# Patient Record
Sex: Male | Born: 1960
Health system: Southern US, Community
[De-identification: ages and names within clinical notes are randomized; demographics above are authoritative.]

## PROBLEM LIST (undated history)

## (undated) DIAGNOSIS — K219 Gastro-esophageal reflux disease without esophagitis: Secondary | ICD-10-CM

## (undated) DIAGNOSIS — K264 Chronic or unspecified duodenal ulcer with hemorrhage: Secondary | ICD-10-CM

## (undated) DIAGNOSIS — I639 Cerebral infarction, unspecified: Secondary | ICD-10-CM

## (undated) DIAGNOSIS — K269 Duodenal ulcer, unspecified as acute or chronic, without hemorrhage or perforation: Secondary | ICD-10-CM

## (undated) DIAGNOSIS — E7800 Pure hypercholesterolemia, unspecified: Secondary | ICD-10-CM

## (undated) DIAGNOSIS — F0390 Unspecified dementia without behavioral disturbance: Secondary | ICD-10-CM

## (undated) DIAGNOSIS — F329 Major depressive disorder, single episode, unspecified: Secondary | ICD-10-CM

## (undated) DIAGNOSIS — D529 Folate deficiency anemia, unspecified: Secondary | ICD-10-CM

## (undated) DIAGNOSIS — S0230XA Fracture of orbital floor, unspecified side, initial encounter for closed fracture: Secondary | ICD-10-CM

## (undated) DIAGNOSIS — E875 Hyperkalemia: Secondary | ICD-10-CM

## (undated) DIAGNOSIS — F1010 Alcohol abuse, uncomplicated: Secondary | ICD-10-CM

## (undated) DIAGNOSIS — Z87820 Personal history of traumatic brain injury: Secondary | ICD-10-CM

## (undated) DIAGNOSIS — G9389 Other specified disorders of brain: Secondary | ICD-10-CM

## (undated) DIAGNOSIS — M1990 Unspecified osteoarthritis, unspecified site: Secondary | ICD-10-CM

## (undated) DIAGNOSIS — R29898 Other symptoms and signs involving the musculoskeletal system: Secondary | ICD-10-CM

## (undated) DIAGNOSIS — W19XXXA Unspecified fall, initial encounter: Secondary | ICD-10-CM

## (undated) DIAGNOSIS — K259 Gastric ulcer, unspecified as acute or chronic, without hemorrhage or perforation: Secondary | ICD-10-CM

## (undated) DIAGNOSIS — S21101A Unspecified open wound of right front wall of thorax without penetration into thoracic cavity, initial encounter: Secondary | ICD-10-CM

## (undated) DIAGNOSIS — F32A Depression, unspecified: Secondary | ICD-10-CM

## (undated) DIAGNOSIS — F319 Bipolar disorder, unspecified: Secondary | ICD-10-CM

## (undated) DIAGNOSIS — F339 Major depressive disorder, recurrent, unspecified: Secondary | ICD-10-CM

## (undated) DIAGNOSIS — F17200 Nicotine dependence, unspecified, uncomplicated: Secondary | ICD-10-CM

## (undated) DIAGNOSIS — N529 Male erectile dysfunction, unspecified: Secondary | ICD-10-CM

## (undated) DIAGNOSIS — J189 Pneumonia, unspecified organism: Secondary | ICD-10-CM

## (undated) DIAGNOSIS — K7290 Hepatic failure, unspecified without coma: Secondary | ICD-10-CM

## (undated) DIAGNOSIS — K760 Fatty (change of) liver, not elsewhere classified: Secondary | ICD-10-CM

## (undated) DIAGNOSIS — I10 Essential (primary) hypertension: Secondary | ICD-10-CM

## (undated) HISTORY — DX: Unspecified open wound of right front wall of thorax without penetration into thoracic cavity, initial encounter: S21.101A

## (undated) HISTORY — DX: Chronic or unspecified duodenal ulcer with hemorrhage: K26.4

## (undated) HISTORY — DX: Unspecified fall, initial encounter: W19.XXXA

## (undated) HISTORY — PX: COLONOSCOPY: SHX174

## (undated) HISTORY — DX: Nicotine dependence, unspecified, uncomplicated: F17.200

## (undated) HISTORY — DX: Personal history of traumatic brain injury: Z87.820

## (undated) HISTORY — PX: EYE SURGERY: SHX253

---
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
## 1995-02-27 DIAGNOSIS — Z87820 Personal history of traumatic brain injury: Secondary | ICD-10-CM | POA: Insufficient documentation

## 1995-02-27 DIAGNOSIS — G9389 Other specified disorders of brain: Secondary | ICD-10-CM

## 1995-02-27 HISTORY — DX: Other specified disorders of brain: G93.89

## 1997-05-31 ENCOUNTER — Encounter: Admission: RE | Admit: 1997-05-31 | Discharge: 1997-05-31 | Payer: Self-pay | Admitting: Sports Medicine

## 1997-06-04 ENCOUNTER — Encounter: Admission: RE | Admit: 1997-06-04 | Discharge: 1997-06-04 | Payer: Self-pay | Admitting: Family Medicine

## 1997-06-15 ENCOUNTER — Encounter: Admission: RE | Admit: 1997-06-15 | Discharge: 1997-06-15 | Payer: Self-pay | Admitting: Family Medicine

## 1998-05-19 ENCOUNTER — Encounter: Admission: RE | Admit: 1998-05-19 | Discharge: 1998-05-19 | Payer: Self-pay | Admitting: Family Medicine

## 1998-12-02 ENCOUNTER — Encounter: Admission: RE | Admit: 1998-12-02 | Discharge: 1998-12-02 | Payer: Self-pay | Admitting: Family Medicine

## 1998-12-16 ENCOUNTER — Encounter: Admission: RE | Admit: 1998-12-16 | Discharge: 1999-03-16 | Payer: Self-pay

## 1999-02-15 ENCOUNTER — Encounter: Admission: RE | Admit: 1999-02-15 | Discharge: 1999-02-15 | Payer: Self-pay | Admitting: Family Medicine

## 2000-01-15 ENCOUNTER — Encounter: Admission: RE | Admit: 2000-01-15 | Discharge: 2000-01-15 | Payer: Self-pay | Admitting: Sports Medicine

## 2000-03-25 ENCOUNTER — Encounter: Admission: RE | Admit: 2000-03-25 | Discharge: 2000-03-25 | Payer: Self-pay | Admitting: Family Medicine

## 2000-05-22 ENCOUNTER — Encounter: Admission: RE | Admit: 2000-05-22 | Discharge: 2000-05-22 | Payer: Self-pay | Admitting: Family Medicine

## 2001-02-04 ENCOUNTER — Encounter: Admission: RE | Admit: 2001-02-04 | Discharge: 2001-02-04 | Payer: Self-pay | Admitting: Family Medicine

## 2001-04-14 ENCOUNTER — Encounter: Admission: RE | Admit: 2001-04-14 | Discharge: 2001-04-14 | Payer: Self-pay | Admitting: Family Medicine

## 2001-04-29 ENCOUNTER — Encounter: Admission: RE | Admit: 2001-04-29 | Discharge: 2001-04-29 | Payer: Self-pay | Admitting: Family Medicine

## 2001-09-22 ENCOUNTER — Encounter: Admission: RE | Admit: 2001-09-22 | Discharge: 2001-09-22 | Payer: Self-pay | Admitting: Family Medicine

## 2002-05-27 ENCOUNTER — Encounter: Admission: RE | Admit: 2002-05-27 | Discharge: 2002-05-27 | Payer: Self-pay | Admitting: Family Medicine

## 2002-05-28 ENCOUNTER — Encounter: Admission: RE | Admit: 2002-05-28 | Discharge: 2002-05-28 | Payer: Self-pay | Admitting: Family Medicine

## 2003-01-11 ENCOUNTER — Encounter: Admission: RE | Admit: 2003-01-11 | Discharge: 2003-01-11 | Payer: Self-pay | Admitting: Family Medicine

## 2003-07-05 ENCOUNTER — Encounter: Admission: RE | Admit: 2003-07-05 | Discharge: 2003-07-05 | Payer: Self-pay | Admitting: Family Medicine

## 2003-08-03 ENCOUNTER — Encounter: Admission: RE | Admit: 2003-08-03 | Discharge: 2003-08-03 | Payer: Self-pay | Admitting: Sports Medicine

## 2003-08-05 ENCOUNTER — Encounter: Admission: RE | Admit: 2003-08-05 | Discharge: 2003-08-05 | Payer: Self-pay | Admitting: Family Medicine

## 2003-09-22 ENCOUNTER — Encounter: Admission: RE | Admit: 2003-09-22 | Discharge: 2003-09-22 | Payer: Self-pay | Admitting: Family Medicine

## 2003-11-04 ENCOUNTER — Ambulatory Visit: Payer: Self-pay | Admitting: Family Medicine

## 2004-05-03 ENCOUNTER — Ambulatory Visit: Payer: Self-pay | Admitting: Family Medicine

## 2004-06-16 ENCOUNTER — Ambulatory Visit: Payer: Self-pay | Admitting: Family Medicine

## 2004-07-06 ENCOUNTER — Ambulatory Visit: Payer: Self-pay | Admitting: Family Medicine

## 2004-07-13 ENCOUNTER — Encounter: Admission: RE | Admit: 2004-07-13 | Discharge: 2004-10-11 | Payer: Self-pay | Admitting: *Deleted

## 2004-08-09 ENCOUNTER — Ambulatory Visit: Payer: Self-pay | Admitting: Family Medicine

## 2005-02-09 ENCOUNTER — Ambulatory Visit: Payer: Self-pay | Admitting: Family Medicine

## 2005-03-14 ENCOUNTER — Ambulatory Visit: Payer: Self-pay | Admitting: Family Medicine

## 2005-04-24 ENCOUNTER — Ambulatory Visit: Payer: Self-pay | Admitting: Family Medicine

## 2005-05-22 ENCOUNTER — Ambulatory Visit: Payer: Self-pay | Admitting: Family Medicine

## 2005-07-05 ENCOUNTER — Ambulatory Visit: Payer: Self-pay | Admitting: Family Medicine

## 2005-07-19 ENCOUNTER — Ambulatory Visit: Payer: Self-pay | Admitting: Sports Medicine

## 2005-07-26 ENCOUNTER — Ambulatory Visit: Payer: Self-pay | Admitting: Family Medicine

## 2005-09-06 ENCOUNTER — Ambulatory Visit: Payer: Self-pay | Admitting: Family Medicine

## 2005-10-10 ENCOUNTER — Ambulatory Visit: Payer: Self-pay | Admitting: Sports Medicine

## 2006-01-02 ENCOUNTER — Ambulatory Visit: Payer: Self-pay | Admitting: Family Medicine

## 2006-01-29 ENCOUNTER — Ambulatory Visit: Payer: Self-pay | Admitting: Sports Medicine

## 2006-01-30 ENCOUNTER — Encounter: Admission: RE | Admit: 2006-01-30 | Discharge: 2006-01-30 | Payer: Self-pay | Admitting: Sports Medicine

## 2006-04-25 DIAGNOSIS — F339 Major depressive disorder, recurrent, unspecified: Secondary | ICD-10-CM | POA: Insufficient documentation

## 2006-04-25 DIAGNOSIS — N529 Male erectile dysfunction, unspecified: Secondary | ICD-10-CM

## 2006-04-25 DIAGNOSIS — K279 Peptic ulcer, site unspecified, unspecified as acute or chronic, without hemorrhage or perforation: Secondary | ICD-10-CM

## 2006-04-25 DIAGNOSIS — E781 Pure hyperglyceridemia: Secondary | ICD-10-CM

## 2006-04-25 DIAGNOSIS — E669 Obesity, unspecified: Secondary | ICD-10-CM | POA: Insufficient documentation

## 2006-04-25 DIAGNOSIS — I10 Essential (primary) hypertension: Secondary | ICD-10-CM

## 2006-04-25 DIAGNOSIS — F17200 Nicotine dependence, unspecified, uncomplicated: Secondary | ICD-10-CM | POA: Insufficient documentation

## 2006-04-25 DIAGNOSIS — M1A00X0 Idiopathic chronic gout, unspecified site, without tophus (tophi): Secondary | ICD-10-CM | POA: Insufficient documentation

## 2006-04-25 HISTORY — DX: Major depressive disorder, recurrent, unspecified: F33.9

## 2006-04-29 ENCOUNTER — Encounter (INDEPENDENT_AMBULATORY_CARE_PROVIDER_SITE_OTHER): Payer: Self-pay | Admitting: *Deleted

## 2006-04-29 ENCOUNTER — Ambulatory Visit: Payer: Self-pay | Admitting: Family Medicine

## 2006-06-27 DIAGNOSIS — F3162 Bipolar disorder, current episode mixed, moderate: Secondary | ICD-10-CM | POA: Insufficient documentation

## 2006-07-03 ENCOUNTER — Ambulatory Visit: Payer: Self-pay | Admitting: Family Medicine

## 2006-07-03 LAB — CONVERTED CEMR LAB
ALT: 45 units/L (ref 0–53)
AST: 54 units/L — ABNORMAL HIGH (ref 0–37)
Alkaline Phosphatase: 68 units/L (ref 39–117)
BUN: 8 mg/dL (ref 6–23)
Calcium: 9.6 mg/dL (ref 8.4–10.5)
Chloride: 100 meq/L (ref 96–112)
Creatinine, Ser: 1.01 mg/dL (ref 0.40–1.50)
Potassium: 3.4 meq/L — ABNORMAL LOW (ref 3.5–5.3)
Sodium: 137 meq/L (ref 135–145)
Valproic Acid Lvl: 18.3 ug/mL — ABNORMAL LOW (ref 50.0–100.0)

## 2006-08-07 ENCOUNTER — Ambulatory Visit: Payer: Self-pay | Admitting: Family Medicine

## 2006-08-07 ENCOUNTER — Encounter (INDEPENDENT_AMBULATORY_CARE_PROVIDER_SITE_OTHER): Payer: Self-pay | Admitting: *Deleted

## 2006-08-07 DIAGNOSIS — R809 Proteinuria, unspecified: Secondary | ICD-10-CM

## 2006-08-07 LAB — CONVERTED CEMR LAB
ALT: 48 units/L (ref 0–53)
CO2: 23 meq/L (ref 19–32)
Calcium: 9.6 mg/dL (ref 8.4–10.5)
Chloride: 100 meq/L (ref 96–112)
Cholesterol: 188 mg/dL (ref 0–200)
Glucose, Bld: 116 mg/dL — ABNORMAL HIGH (ref 70–99)
Hgb A1c MFr Bld: 4.9 %
LDL Cholesterol: 73 mg/dL (ref 0–99)
Protein, U semiquant: >=300
Sodium: 137 meq/L (ref 135–145)
Total CHOL/HDL Ratio: 2.3
Total Protein: 7.3 g/dL (ref 6.0–8.3)
Triglycerides: 164 mg/dL — ABNORMAL HIGH (ref <150)
VLDL: 33 mg/dL (ref 0–40)

## 2006-08-08 ENCOUNTER — Telehealth: Payer: Self-pay | Admitting: *Deleted

## 2006-10-02 ENCOUNTER — Ambulatory Visit: Payer: Self-pay | Admitting: Family Medicine

## 2007-02-26 ENCOUNTER — Encounter (INDEPENDENT_AMBULATORY_CARE_PROVIDER_SITE_OTHER): Payer: Self-pay | Admitting: *Deleted

## 2007-02-26 ENCOUNTER — Ambulatory Visit: Payer: Self-pay | Admitting: Family Medicine

## 2007-02-28 ENCOUNTER — Telehealth (INDEPENDENT_AMBULATORY_CARE_PROVIDER_SITE_OTHER): Payer: Self-pay | Admitting: *Deleted

## 2007-02-28 LAB — CONVERTED CEMR LAB
ALT: 34 units/L (ref 0–53)
CO2: 21 meq/L (ref 19–32)
Calcium: 9.7 mg/dL (ref 8.4–10.5)
Chloride: 102 meq/L (ref 96–112)
Sodium: 137 meq/L (ref 135–145)
Total Protein: 7.4 g/dL (ref 6.0–8.3)
Uric Acid, Serum: 4.3 mg/dL (ref 2.4–7.0)

## 2007-05-08 ENCOUNTER — Ambulatory Visit: Payer: Self-pay | Admitting: Sports Medicine

## 2007-06-11 ENCOUNTER — Encounter (INDEPENDENT_AMBULATORY_CARE_PROVIDER_SITE_OTHER): Payer: Self-pay | Admitting: *Deleted

## 2007-06-11 ENCOUNTER — Ambulatory Visit: Payer: Self-pay | Admitting: Family Medicine

## 2007-06-13 LAB — CONVERTED CEMR LAB
ALT: 24 units/L (ref 0–53)
AST: 37 units/L (ref 0–37)
Alkaline Phosphatase: 57 units/L (ref 39–117)
CO2: 22 meq/L (ref 19–32)
MCHC: 33.9 g/dL (ref 30.0–36.0)
MCV: 108.3 fL — ABNORMAL HIGH (ref 78.0–100.0)
Platelets: 204 10*3/uL (ref 150–400)
RBC: 3.73 M/uL — ABNORMAL LOW (ref 4.22–5.81)
Sodium: 139 meq/L (ref 135–145)
Total Bilirubin: 0.7 mg/dL (ref 0.3–1.2)
Total Protein: 7.2 g/dL (ref 6.0–8.3)
WBC: 9.6 10*3/uL (ref 4.0–10.5)

## 2007-08-04 ENCOUNTER — Encounter (INDEPENDENT_AMBULATORY_CARE_PROVIDER_SITE_OTHER): Payer: Self-pay | Admitting: *Deleted

## 2007-11-10 ENCOUNTER — Encounter: Payer: Self-pay | Admitting: Family Medicine

## 2007-11-10 ENCOUNTER — Ambulatory Visit: Payer: Self-pay | Admitting: Family Medicine

## 2007-11-10 LAB — CONVERTED CEMR LAB: Hgb A1c MFr Bld: 4.8 %

## 2007-11-11 LAB — CONVERTED CEMR LAB
ALT: 49 U/L
AST: 50 U/L — ABNORMAL HIGH
Albumin: 4.3 g/dL
Alkaline Phosphatase: 60 U/L
BUN: 11 mg/dL
CO2: 20 meq/L
Calcium: 9.3 mg/dL
Chloride: 101 meq/L
Cholesterol: 167 mg/dL
Creatinine, Ser: 0.98 mg/dL
Glucose, Bld: 131 mg/dL — ABNORMAL HIGH
HCT: 43.1 %
HDL: 76 mg/dL
Hemoglobin: 14.6 g/dL
LDL Cholesterol: 72 mg/dL
MCHC: 33.9 g/dL
MCV: 109.1 fL — ABNORMAL HIGH
Platelets: 187 K/uL
Potassium: 4.1 meq/L
RBC: 3.95 M/uL — ABNORMAL LOW
RDW: 13.5 %
Sodium: 134 meq/L — ABNORMAL LOW
Total Bilirubin: 0.7 mg/dL
Total CHOL/HDL Ratio: 2.2
Total Protein: 7.2 g/dL
Triglycerides: 96 mg/dL
VLDL: 19 mg/dL
Valproic Acid Lvl: 39.1 ug/mL — ABNORMAL LOW
WBC: 8.8 10*3/microliter

## 2007-11-14 ENCOUNTER — Encounter (INDEPENDENT_AMBULATORY_CARE_PROVIDER_SITE_OTHER): Payer: Self-pay | Admitting: *Deleted

## 2008-05-10 ENCOUNTER — Inpatient Hospital Stay (HOSPITAL_COMMUNITY): Admission: EM | Admit: 2008-05-10 | Discharge: 2008-05-13 | Payer: Self-pay | Admitting: Emergency Medicine

## 2008-05-10 ENCOUNTER — Ambulatory Visit: Payer: Self-pay | Admitting: Internal Medicine

## 2008-06-02 ENCOUNTER — Ambulatory Visit: Payer: Self-pay | Admitting: Family Medicine

## 2008-07-01 ENCOUNTER — Ambulatory Visit: Payer: Self-pay | Admitting: Family Medicine

## 2008-08-02 ENCOUNTER — Ambulatory Visit: Payer: Self-pay | Admitting: Family Medicine

## 2008-08-03 ENCOUNTER — Encounter (INDEPENDENT_AMBULATORY_CARE_PROVIDER_SITE_OTHER): Payer: Self-pay | Admitting: Family Medicine

## 2008-09-07 ENCOUNTER — Ambulatory Visit: Payer: Self-pay | Admitting: Family Medicine

## 2008-09-07 ENCOUNTER — Encounter: Payer: Self-pay | Admitting: Family Medicine

## 2008-09-09 DIAGNOSIS — R7401 Elevation of levels of liver transaminase levels: Secondary | ICD-10-CM | POA: Insufficient documentation

## 2008-09-09 DIAGNOSIS — R740 Nonspecific elevation of levels of transaminase and lactic acid dehydrogenase [LDH]: Secondary | ICD-10-CM

## 2008-09-09 LAB — CONVERTED CEMR LAB
ALT: 65 units/L — ABNORMAL HIGH (ref 0–53)
Albumin: 4.2 g/dL (ref 3.5–5.2)
Alkaline Phosphatase: 55 units/L (ref 39–117)
Potassium: 4.9 meq/L (ref 3.5–5.3)
Sodium: 136 meq/L (ref 135–145)
Total Bilirubin: 0.8 mg/dL (ref 0.3–1.2)
Total Protein: 7.2 g/dL (ref 6.0–8.3)

## 2008-11-08 ENCOUNTER — Encounter: Payer: Self-pay | Admitting: *Deleted

## 2008-11-17 ENCOUNTER — Encounter: Payer: Self-pay | Admitting: Family Medicine

## 2008-11-17 ENCOUNTER — Ambulatory Visit: Payer: Self-pay | Admitting: Family Medicine

## 2008-11-19 ENCOUNTER — Encounter: Payer: Self-pay | Admitting: Family Medicine

## 2008-11-19 LAB — CONVERTED CEMR LAB
BUN: 13 mg/dL (ref 6–23)
CO2: 19 meq/L (ref 19–32)
Calcium: 9.4 mg/dL (ref 8.4–10.5)
Chloride: 101 meq/L (ref 96–112)
Cholesterol: 169 mg/dL (ref 0–200)
Creatinine, Ser: 1.1 mg/dL (ref 0.40–1.50)
HDL: 71 mg/dL (ref >39)
Total CHOL/HDL Ratio: 2.4
Triglycerides: 88 mg/dL (ref <150)

## 2009-04-18 ENCOUNTER — Ambulatory Visit: Payer: Self-pay | Admitting: Family Medicine

## 2009-04-18 ENCOUNTER — Encounter: Payer: Self-pay | Admitting: Family Medicine

## 2009-04-27 LAB — CONVERTED CEMR LAB
ALT: 38 units/L (ref 0–53)
AST: 43 units/L — ABNORMAL HIGH (ref 0–37)
Alkaline Phosphatase: 62 units/L (ref 39–117)
BUN: 10 mg/dL (ref 6–23)
Chloride: 96 meq/L (ref 96–112)
Creatinine, Ser: 0.89 mg/dL (ref 0.40–1.50)
Direct LDL: 86 mg/dL
Total Bilirubin: 1 mg/dL (ref 0.3–1.2)

## 2009-07-11 ENCOUNTER — Ambulatory Visit: Payer: Self-pay | Admitting: Family Medicine

## 2009-07-11 ENCOUNTER — Encounter: Payer: Self-pay | Admitting: Family Medicine

## 2009-07-11 DIAGNOSIS — R109 Unspecified abdominal pain: Secondary | ICD-10-CM | POA: Insufficient documentation

## 2009-07-11 DIAGNOSIS — F1020 Alcohol dependence, uncomplicated: Secondary | ICD-10-CM | POA: Insufficient documentation

## 2009-07-11 DIAGNOSIS — R634 Abnormal weight loss: Secondary | ICD-10-CM | POA: Insufficient documentation

## 2009-07-11 LAB — CONVERTED CEMR LAB
ALT: 130 units/L — ABNORMAL HIGH (ref 0–53)
Ammonia: 34 umol/L (ref 11–35)
Bilirubin Urine: POSITIVE
Blood in Urine, dipstick: NEGATIVE
CO2: 22 meq/L (ref 19–32)
Calcium: 9.3 mg/dL (ref 8.4–10.5)
Chloride: 103 meq/L (ref 96–112)
Creatinine, Ser: 1.07 mg/dL (ref 0.40–1.50)
GGT: 148 units/L — ABNORMAL HIGH (ref 7–51)
Glucose, Urine, Semiquant: NEGATIVE
HCT: 34.6 % — ABNORMAL LOW (ref 39.0–52.0)
MCV: 103.9 fL — ABNORMAL HIGH (ref 78.0–100.0)
Nitrite: NEGATIVE
Platelets: 237 10*3/uL (ref 150–400)
Protein, U semiquant: 30
RBC: 3.33 M/uL — ABNORMAL LOW (ref 4.22–5.81)
Sodium: 133 meq/L — ABNORMAL LOW (ref 135–145)
Specific Gravity, Urine: 1.015
Total Protein: 6.7 g/dL (ref 6.0–8.3)
Urobilinogen, UA: 1
WBC Urine, dipstick: NEGATIVE
WBC: 7.9 10*3/uL (ref 4.0–10.5)
pH: 6

## 2009-07-12 ENCOUNTER — Telehealth: Payer: Self-pay | Admitting: Family Medicine

## 2009-07-13 ENCOUNTER — Encounter: Payer: Self-pay | Admitting: Family Medicine

## 2009-07-18 ENCOUNTER — Ambulatory Visit: Payer: Self-pay | Admitting: Family Medicine

## 2009-07-18 ENCOUNTER — Encounter: Admission: RE | Admit: 2009-07-18 | Discharge: 2009-07-18 | Payer: Self-pay | Admitting: Family Medicine

## 2009-07-18 ENCOUNTER — Encounter: Payer: Self-pay | Admitting: Family Medicine

## 2009-07-19 ENCOUNTER — Encounter: Payer: Self-pay | Admitting: Family Medicine

## 2009-07-19 ENCOUNTER — Telehealth: Payer: Self-pay | Admitting: Family Medicine

## 2009-07-20 ENCOUNTER — Telehealth: Payer: Self-pay | Admitting: Family Medicine

## 2009-07-20 LAB — CONVERTED CEMR LAB
Folate: 2.1 ng/mL — ABNORMAL LOW
RBC: 3.31 M/uL — ABNORMAL LOW (ref 4.22–5.81)
Vitamin B-12: 369 pg/mL (ref 211–911)

## 2009-07-28 ENCOUNTER — Encounter: Payer: Self-pay | Admitting: Family Medicine

## 2009-07-28 ENCOUNTER — Ambulatory Visit: Payer: Self-pay | Admitting: Family Medicine

## 2009-07-28 DIAGNOSIS — R269 Unspecified abnormalities of gait and mobility: Secondary | ICD-10-CM | POA: Insufficient documentation

## 2009-07-28 DIAGNOSIS — M79609 Pain in unspecified limb: Secondary | ICD-10-CM | POA: Insufficient documentation

## 2009-07-29 ENCOUNTER — Encounter: Admission: RE | Admit: 2009-07-29 | Discharge: 2009-07-29 | Payer: Self-pay | Admitting: Family Medicine

## 2009-07-29 ENCOUNTER — Encounter: Payer: Self-pay | Admitting: Family Medicine

## 2009-07-29 LAB — CONVERTED CEMR LAB

## 2009-08-02 ENCOUNTER — Encounter: Payer: Self-pay | Admitting: Family Medicine

## 2009-08-02 ENCOUNTER — Ambulatory Visit: Payer: Self-pay | Admitting: Family Medicine

## 2009-08-02 DIAGNOSIS — D529 Folate deficiency anemia, unspecified: Secondary | ICD-10-CM

## 2009-08-02 HISTORY — DX: Folate deficiency anemia, unspecified: D52.9

## 2009-08-03 ENCOUNTER — Encounter: Payer: Self-pay | Admitting: *Deleted

## 2009-08-03 LAB — CONVERTED CEMR LAB
ALT: 32 units/L (ref 0–53)
AST: 62 units/L — ABNORMAL HIGH (ref 0–37)
Albumin: 4.1 g/dL (ref 3.5–5.2)
Calcium: 9.5 mg/dL (ref 8.4–10.5)
Chloride: 101 meq/L (ref 96–112)
Direct LDL: 96 mg/dL
Potassium: 4.4 meq/L (ref 3.5–5.3)
Sodium: 134 meq/L — ABNORMAL LOW (ref 135–145)
Total Protein: 7.6 g/dL (ref 6.0–8.3)

## 2009-08-08 ENCOUNTER — Encounter: Admission: RE | Admit: 2009-08-08 | Discharge: 2009-08-08 | Payer: Self-pay | Admitting: Family Medicine

## 2009-08-24 ENCOUNTER — Ambulatory Visit: Payer: Self-pay | Admitting: Family Medicine

## 2009-08-24 ENCOUNTER — Encounter: Payer: Self-pay | Admitting: Family Medicine

## 2009-08-24 LAB — CONVERTED CEMR LAB
ALT: 30 units/L (ref 0–53)
Albumin: 4 g/dL (ref 3.5–5.2)
CO2: 19 meq/L (ref 19–32)
Chloride: 107 meq/L (ref 96–112)
Platelets: 186 10*3/uL (ref 150–400)
Potassium: 4.3 meq/L (ref 3.5–5.3)
Sodium: 138 meq/L (ref 135–145)
Total Bilirubin: 0.6 mg/dL (ref 0.3–1.2)
Total Protein: 7.3 g/dL (ref 6.0–8.3)
Triglycerides: 89 mg/dL (ref <150)
WBC: 8.3 10*3/uL (ref 4.0–10.5)

## 2009-08-25 ENCOUNTER — Encounter: Payer: Self-pay | Admitting: Family Medicine

## 2010-01-13 ENCOUNTER — Ambulatory Visit: Payer: Self-pay | Admitting: Family Medicine

## 2010-01-13 ENCOUNTER — Encounter: Payer: Self-pay | Admitting: Family Medicine

## 2010-01-13 LAB — CONVERTED CEMR LAB
Albumin: 4.2 g/dL (ref 3.5–5.2)
Alkaline Phosphatase: 93 units/L (ref 39–117)
CO2: 22 meq/L (ref 19–32)
Glucose, Bld: 123 mg/dL — ABNORMAL HIGH (ref 70–99)
Lithium Lvl: 0.31 meq/L — ABNORMAL LOW (ref 0.80–1.40)
Potassium: 4.3 meq/L (ref 3.5–5.3)
Sodium: 139 meq/L (ref 135–145)
TSH: 2.353 microintl units/mL (ref 0.350–4.500)
Total Protein: 7.3 g/dL (ref 6.0–8.3)

## 2010-01-16 ENCOUNTER — Telehealth: Payer: Self-pay | Admitting: Family Medicine

## 2010-03-03 ENCOUNTER — Telehealth: Payer: Self-pay | Admitting: Family Medicine

## 2010-03-19 ENCOUNTER — Encounter: Payer: Self-pay | Admitting: Family Medicine

## 2010-03-28 ENCOUNTER — Ambulatory Visit: Admission: RE | Admit: 2010-03-28 | Discharge: 2010-03-28 | Payer: Self-pay | Source: Home / Self Care

## 2010-03-30 NOTE — Progress Notes (Signed)
  Phone Note Outgoing Call   Call placed by: Milinda Antis MD,  January 16, 2010 2:18 PM Details for Reason: Call To Dr. Donell Beers Summary of Call: Called and spoke with Triage at Dr. Donell Beers office, given his voicemail to relay lab results. Lithium level subtherapeutic at 0.31 Also faxed labs

## 2010-03-30 NOTE — Assessment & Plan Note (Signed)
Summary: f/up,tcb(resch'd from 10/31)[facility]   Vital Signs:  Patient profile:   50 year old male Height:      72 inches Weight:      238 pounds BMI:     32.40 Temp:     98.2 degrees F oral BP sitting:   130 / 80  (right arm) Cuff size:   regular  Vitals Entered By: Tessie Fass CMA (January 13, 2010 1:51 PM) CC: F/U labs, HTN Is Patient Diabetic? No Pain Assessment Patient in pain? no        Primary Care Provider:  Eustaquio Boyden  MD  CC:  F/U labs and HTN.  History of Present Illness:   No concerns Need flu shot Need labs drawn from mental health   HTN- no HA, no CP, no SOB, tolerating BP meds  Tobacco use- unchanged  [facility] Dr. Donell Beers  Fax [phone]  Habits & Providers  Alcohol-Tobacco-Diet     Tobacco Status: current     Tobacco Counseling: to quit use of tobacco products     Cigarette Packs/Day: 0.5  Current Medications (verified): 1)  Aspirin 81 Mg Chew (Aspirin) .... One Daily 2)  Norvasc 10 Mg Tabs (Amlodipine Besylate) .... Take 1 Tablet By Mouth Once A Day 3)  Lopressor 100 Mg  Tabs (Metoprolol Tartrate) .Marland Kitchen.. 1 By Mouth Two Times A Day 4)  Spironolactone 50 Mg Tabs (Spironolactone) .... Take One By Mouth Qdaily 5)  Allopurinol 300 Mg Tabs (Allopurinol) .... Take One By Mouth Daily 6)  Lovaza 1 Gm Caps (Omega-3-Acid Ethyl Esters) .... Take 2 Capsule By Mouth Twice A Day 7)  Fluoxetine Hcl 40 Mg Caps (Fluoxetine Hcl) .... Take 1 Capsule By Mouth Once A Day 8)  Folic Acid 1 Mg Tabs (Folic Acid) .... One Daily 9)  Vitamin B-1 100 Mg Tabs (Thiamine Hcl) .... One Daily 10)  Multivitamins  Caps (Multiple Vitamin) .... One Daily 11)  Tramadol Hcl 50 Mg Tabs (Tramadol Hcl) .... One By Mouth Two Times A Day As Needed Pain 12)  Lithium Carbonate 450 Mg Cr-Tabs (Lithium Carbonate) .Marland Kitchen.. 1 By Mouth Daily Mental Health  Allergies (verified): 1)  ! Ace Inhibitors 2)  Penicillin G Potassium (Penicillin G Potassium)  Past  History:  Past Medical History: Hospitalization for ACEI --> angioedema with ICU stay and intubation 04/2008 Baseline Cr 1.1 w/ longstanding HTN - 05/05 Full neuro psych eval 12/00 ([facility]), Head trauma (`97) with resulting neuro deficit ?DM diagnosed by elevated fcbg x 2, A1c 5.2% h/o hypertriglyceridemia treated with lovaza, niaspan and fibrate until elevated LFTs 2010, now only on lovaza. h/o imbalance/falls with posterior rib fracture, found to have folate deficiency h/o EtOH binging hypertension psych- dr. riddle and dr. reddy optho- dr. Mitzi Davenport  Physical Exam  General:  Well-developed,well-nourished,in no acute distress; alert,appropriate and cooperative throughout examination Vital signs noted  Lungs:  CTAB Heart:  RRR, no murmur Extremities:  no edema   Impression & Recommendations:  Problem # 1:  HYPERTENSION, BENIGN SYSTEMIC (ICD-401.1) Assessment Unchanged No change to meds doing well His updated medication list for this problem includes:    Norvasc 10 Mg Tabs (Amlodipine besylate) .Marland Kitchen... Take 1 tablet by mouth once a day    Lopressor 100 Mg Tabs (Metoprolol tartrate) .Marland Kitchen... 1 by mouth two times a day    Spironolactone 50 Mg Tabs (Spironolactone) .Marland Kitchen... Take one by mouth qdaily  Orders: Comp Met-[facility] ([id number]) [facility]- Est Level  3 (24401)  Problem # 2:  TOBACCO DEPENDENCE (ICD-305.1)  Assessment: Unchanged not ready to quit Orders: [facility]- Est Level  3 (40981)  Complete Medication List: 1)  Aspirin 81 Mg Chew (Aspirin) .... One daily 2)  Norvasc 10 Mg Tabs (Amlodipine besylate) .... Take 1 tablet by mouth once a day 3)  Lopressor 100 Mg Tabs (Metoprolol tartrate) .Marland Kitchen.. 1 by mouth two times a day 4)  Spironolactone 50 Mg Tabs (Spironolactone) .... Take one by mouth qdaily 5)  Allopurinol 300 Mg Tabs (Allopurinol) .... Take one by mouth daily 6)  Lovaza 1 Gm Caps (Omega-3-acid ethyl esters) .... Take 2 capsule by mouth twice a day 7)  Fluoxetine Hcl 40 Mg Caps  (Fluoxetine hcl) .... Take 1 capsule by mouth once a day 8)  Folic Acid 1 Mg Tabs (Folic acid) .... One daily 9)  Vitamin B-1 100 Mg Tabs (Thiamine hcl) .... One daily 10)  Multivitamins Caps (Multiple vitamin) .... One daily 11)  Tramadol Hcl 50 Mg Tabs (Tramadol hcl) .... One by mouth two times a day as needed pain 12)  Lithium Carbonate 450 Mg Cr-tabs (Lithium carbonate) .Marland Kitchen.. 1 by mouth daily mental health  Other Orders: TSH-[facility] [id number]) Miscellaneous Lab Charge-[facility] [phone]) Influenza Vaccine NON MCR (65784)  Patient Instructions: 1)  Today you received your Flu shot 2)   We will fax the labs to Dr. Olin Hauser 3)  Make an appt to see Dr. Alvester Morin  in Jan or Feb  Prescriptions: FOLIC ACID 1 MG TABS (FOLIC ACID) one daily  #60 x 3   Entered and Authorized by:   Milinda Antis MD   Signed by:   Milinda Antis MD on 01/13/2010   Method used:   [facility] to        [facility]  [address] [id number]       [address]       [place], [place]  [address]       Ph: [phone] or [phone]       Fax: [phone]   RxID:   [id number]    Orders Added: 1)  Comp Met-[facility] [[id number]] 2)  TSH-[facility] [[id number]] 3)  Miscellaneous Lab Charge-[facility] [99999] 4)  Influenza Vaccine NON MCR [00028] 5)  [facility]- Est Level  3 [54270]   Immunizations Administered:  Influenza Vaccine # 1:    Vaccine Type: Fluvax Non-MCR    Site: right deltoid    Mfr: GlaxoSmithKline    Dose: 0.5 ml    Route: IM    Given by: Tessie Fass CMA    Exp. Date: 08/26/2010    Lot #: [id number]    VIS given: 09/20/09 version given January 13, 2010.  Flu Vaccine Consent Questions:    Do you have a history of severe allergic reactions to this vaccine? no    Any prior history of allergic reactions to egg and/or gelatin? no    Do you have a sensitivity to the preservative Thimersol? no    Do you have a past history of Guillan-Barre Syndrome? no    Do you currently have an acute febrile  illness? no    Have you ever had a severe reaction to latex? no    Vaccine information given and explained to patient? yes   Immunizations Administered:  Influenza Vaccine # 1:    Vaccine Type: Fluvax Non-MCR    Site: right deltoid    Mfr: GlaxoSmithKline    Dose: 0.5 ml    Route: IM    Given by: Tessie Fass CMA  Exp. Date: 08/26/2010    Lot #: [id number]    VIS given: 09/20/09 version given January 13, 2010.   Prevention & Chronic Care Immunizations   Influenza vaccine: Fluvax Non-MCR  (01/13/2010)   Influenza vaccine due: 10/27/2009    Tetanus booster: 06/27/2003: Done.   Tetanus booster due: 06/26/2013    Pneumococcal vaccine: Pneumovax  (02/26/2007)   Pneumococcal vaccine due: 02/26/2012  Colorectal Screening   Hemoccult: Not documented   Hemoccult action/deferral: Ordered  (07/28/2009)    Colonoscopy: Not documented   Colonoscopy action/deferral: GI Referral  (04/18/2009)  Other Screening   PSA: 0.26  (07/29/2009)   PSA action/deferral: Discussion deferred  (09/07/2008)   PSA due due: 07/30/2010   Smoking status: current  (01/13/2010)   Smoking cessation counseling: yes  (06/11/2007)  Lipids   Total Cholesterol: 169  (11/17/2008)   LDL: 80  (11/17/2008)   LDL Direct: 96  (08/02/2009)   HDL: 71  (11/17/2008)   Triglycerides: 89  (08/24/2009)    SGOT (AST): 38  (08/24/2009)   SGPT (ALT): 30  (08/24/2009) CMP ordered    Alkaline phosphatase: 76  (08/24/2009)   Total bilirubin: 0.6  (08/24/2009)  Hypertension   Last Blood Pressure: 130 / 80  (01/13/2010)   Serum creatinine: 0.98  (08/24/2009)   Serum potassium 4.3  (08/24/2009) CMP ordered     Hypertension flowsheet reviewed?: Yes   Progress toward BP goal: At goal  Self-Management Support :   Personal Goals (by the next clinic visit) :      Personal blood pressure goal: 140/90  (04/18/2009)     Personal LDL goal: 100  (04/18/2009)    Hypertension self-management support: Written  self-care plan  (07/28/2009)    Lipid self-management support: Not documented     Lipid self-management support not done because: Good outcomes  (04/18/2009)

## 2010-03-30 NOTE — Letter (Signed)
Summary: Results Follow-up Letter  [facility]  [address]   [place], [place] [address]   Phone: [phone]  Fax: [phone]    07/13/2009  [address], [place]  [address]  Dear Mr. LADY,   The following are the results of your recent test(s):  Test     Result      Your thyroid function and kidney function was normal.  Your liver function was elevated again, more than last time.  AST 130s, ALT 130s.  Normal is around 30s.  This leads me to believe that the alcohol consumption is causing liver damage.  I'm glad that you stopped recently, but this is something you will need to abstain from for the long run.  We will need to get an ultrasound of your liver in the next few months.  However, this doesn't explain your left sided abdominal pain (since your liver is on the right side).    Your blood level (red blood cells) was somewhat low, and the size of your red blood cells was large.  This is indication of a vitamin deficiency from drinking alcohol.  I would like you to come in at your earliest convenience to check vitamin levels, and we will likely start you on specific vitamin supplementation (folate and B12) once your levels return.  I also want you to get that chest xray.  In the meantime, it would be good to start taking a multivitamin daily.  The vitamin deficiency could explain your imbalance and possibly your abdominal pain.  I would like to see you (early June) after you get your blood work and chest xray.  Please call us with questions. _________________________________________________________    Sincerely,  Eustaquio Boyden  MD [facility]            Appended Document: Results Follow-up Letter letter mailed.

## 2010-03-30 NOTE — Miscellaneous (Signed)
Summary: re: [place]  Clinical Lists Changes called pt lmom to return call. pt has [place] appt at [address] [facility] 08/08/09 at 8:00. needs to arrive 15 minutes early and DO NOT eat or drink anything after midnight the night before.Tessie Fass CMA  August 03, 2009 12:26 PM CALLED PT AND LEFT DETAILED MESSAGE WITH ABOVE INFO ON THE ANSWERING MACHINE.Marland KitchenArlyss Repress CMA,  August 05, 2009 11:06 AM

## 2010-03-30 NOTE — Progress Notes (Signed)
Summary: phn msg  Phone Note Call from Patient Call back at [phone]   Caller: Elon Jester Summary of Call: pt did not understand all that Dr Reece Agar told him and wife is hoping to talk to the doctor to see if she can help understand. Initial call taken by: De Nurse,  Jul 12, 2009 8:45 AM  Follow-up for Phone Call        spoke with Mr. Mullarkey again, he says there was no confusion.  advised to get blood work Monday and CXR.  Concerned for B12 or folate deficiency in this long term EtOH user.  Also advised to stay away from alcohol as it's damaging liver.  May need liver US to further eval as elevated LFTs again today.  will get vitamin levels then start folate/B12. Follow-up by: Eustaquio Boyden  MD,  Jul 12, 2009 3:31 PM  New Problems: ANEMIA, MACROCYTIC (ICD-281.9)   New Problems: ANEMIA, MACROCYTIC (ICD-281.9) Prescriptions: ALLOPURINOL 300 MG TABS (ALLOPURINOL) take one by mouth daily  #31 x 5   Entered and Authorized by:   Eustaquio Boyden  MD   Signed by:   Eustaquio Boyden  MD on 07/12/2009   Method used:   [facility]  [address] [id number]       [address]       [place], [place]  [address]       Ph: [phone] or [phone]       Fax: [phone]   RxID:   [id number]

## 2010-03-30 NOTE — Assessment & Plan Note (Signed)
Summary: f/up,tcb, UA, ESR = 71   Vital Signs:  Patient profile:   50 year old male Height:      72 inches Weight:      234 pounds BMI:     31.85 Temp:     97.7 degrees F oral Pulse rate:   71 / minute BP sitting:   128 / 85  (left arm) Cuff size:   regular  Vitals Entered By: Tessie Fass CMA (Jul 11, 2009 1:48 PM) CC: F/U Is Patient Diabetic? No   Primary Care Provider:  Eustaquio Boyden  MD  CC:  F/U.  History of Present Illness: CC: feeling ill  1. 5wk h/o not feeling well.  Mostly back hurting then radiated to side, started around April 1st.  Also having trouble walking around.  not feeling weak or tired.  Feels stamina less than normal.  Having falls, so lays down.  No nausea/vomiting, d/c, blood in stool or urine, voiding normally.  No gassy feeling or bloated indigestion.  No fevers/chills, night sweats.  No new foods or recent travel.  Use city water.  no changes in stool character or color.  Not more confused than normal.  15 lb weight loss since last visit 03/2009.  Decreased appetite.  Imbalance - no dizziness/vertigo, no presyncope.  Has fallen 2-3 times at home.    Stopped EtOH April 1st.  no new vitamins/supplements/herbals  2. tobacco - 1/2 ppd, precontemplative.  3. HTN - BP controlled.  Habits & Providers  Alcohol-Tobacco-Diet     Tobacco Status: current     Tobacco Counseling: to quit use of tobacco products     Cigarette Packs/Day: 0.5  Current Medications (verified): 1)  Lovaza 1 Gm Caps (Omega-3-Acid Ethyl Esters) .... Take 2 Capsule By Mouth Twice A Day 2)  Allopurinol 300 Mg Tabs (Allopurinol) .... Take One By Mouth Daily 3)  Lopressor 100 Mg  Tabs (Metoprolol Tartrate) .Marland Kitchen.. 1 By Mouth Two Times A Day 4)  Spironolactone 50 Mg Tabs (Spironolactone) .... Take One By Mouth Qdaily 5)  Norvasc 10 Mg Tabs (Amlodipine Besylate) .... Take 1 Tablet By Mouth Once A Day 6)  Fluoxetine Hcl 40 Mg Caps (Fluoxetine Hcl) .... Take 1 Capsule By Mouth Once A  Day 7)  Depakote 500 Mg Tbec (Divalproex Sodium) .... Take 1 Tablet By Mouth At Bedtime 8)  Adult Aspirin Low Strength 81 Mg  Chew (Aspirin) .Marland Kitchen.. 1 By Mouth Qday  Allergies (verified): 1)  ! Ace Inhibitors 2)  Penicillin G Potassium (Penicillin G Potassium)  Past History:  Past medical, surgical, family and social histories (including risk factors) reviewed for relevance to current acute and chronic problems.  Past Medical History: Hospitalization for ACEI --> angioedema with ICU stay and intubation 04/2008 Baseline Cr 1.1 w/ longstanding HTN - 05/05 Epidermoid cyst removed from chest wall 06/05 Full neuro psych eval 12/00 ([facility]), Head trauma (`97) with resulting neuro deficit ?DM diagnosed by elevated fcbg x 2, A1c 5.2%  psych- dr. riddle and dr. reddy optho- dr. Mitzi Davenport  Past Surgical History: Reviewed history from 09/07/2008 and no changes required. Removal of cyst on chest wall - 08/03/2003  On valproate - needs CBC w diff, LFTs and valproate level  Family History: Reviewed history from 04/25/2006 and no changes required. Pt adopted so unsure of parents med history  Social History: Reviewed history from 02/26/2007 and no changes required. Pt continues to smoke 1/2 ppd - in contemplative stages of quitting.  Lives with wife and 2 daughters  here in [place].  Granddaughter is serenity troy, born 11/08.   On full disability.  Occasional ETOH w/ binges (usually3-4 beers on 2-3times/week.) Pt enjoys watching TV especially NASCAR.  Physical Exam  General:  tired appearing, NAD, A&Ox3 Eyes:  slight sceral icterus Mouth:  pharynx pink and moist.   Lungs:  Normal respiratory effort, chest expands symmetrically. Lungs are clear to auscultation, no crackles or wheezes. Heart:  Normal rate and regular rhythm. S1 and S2 normal without gallop, murmur, click, rub or other extra sounds. Abdomen:  hyperactive bowel sounds, distended, TTP LUQ and LLQ, no flank pain bilaterally, no masses  appreciated.  mild hepatomegaly. Msk:  No deformity or scoliosis noted of thoracic or lumbar spine.  nontender midline Extremities:  no edema Neurologic:  No cranial nerve deficits noted.  Plantar reflexes are down-going bilaterally. DTRs are symmetrical throughout.  Sensory, motor functions appear intact.  mild dysdiadochokinesia, mild slowing of finger to nose.  imbalance with romberg.  negative pronator drift.  gait slowed, hesitant, uncertain.   Impression & Recommendations:  Problem # 1:  WEIGHT LOSS, RECENT (ICD-783.21) concerning in patient with h/o tobacco abuse and alcohol abuse.  check blood work to help elucidate etiology.  will call with results.  Orders: Comp Met-[facility] [id number]) CBC-[facility] [id number]) RPR-[facility] [id number]) Lipase-[facility] [id number]) TSH-[facility] [id number]) Sed Rate (ESR)-[facility] [id number]) Miscellaneous Lab Charge-[facility] [id number]) CXR- 2view (CXR) [facility]- Est  Level 4 (99214)  Problem # 2:  ABDOMINAL PAIN OTHER SPECIFIED SITE (ICD-789.09) UA with protein, ketones, bili.  micro without mention of casts or active sediment.  ESR 71, ammonia 34.  Await CMP, lipase, CBC, TSH. His updated medication list for this problem includes:    Adult Aspirin Low Strength 81 Mg Chew (Aspirin) .Marland Kitchen... 1 by mouth qday  Orders: Lipase-[facility] ([id number]) Urinalysis-[facility] (00000) Sed Rate (ESR)-[facility] [id number]) Miscellaneous Lab Charge-[facility] [id number]) [facility]- Est  Level 4 (57322)  Problem # 3:  ALCOHOL USE (ICD-305.00) stopped April 1st 2/2 not feeling well.  cerebellar function today slightly slowed, however does have h/o neurologic deficit per chart (Head trauma (`97) with resulting neuro deficit). Orders: Ammonia-[facility] ([id number]) Miscellaneous Lab Charge-[facility] [phone])  Problem # 4:  TOBACCO DEPENDENCE (ICD-305.1)  Orders: CXR- 2view (CXR) [facility]- Est  Level 4 (15176)  Encouraged smoking cessation   Problem # 5:  HYPERTENSION, BENIGN SYSTEMIC (ICD-401.1) at goal on current regimen.  no ACEI/ARB 2/2  angioedema after ACEI.  His updated medication list for this problem includes:    Lopressor 100 Mg Tabs (Metoprolol tartrate) .Marland Kitchen... 1 by mouth two times a day    Spironolactone 50 Mg Tabs (Spironolactone) .Marland Kitchen... Take one by mouth qdaily    Norvasc 10 Mg Tabs (Amlodipine besylate) .Marland Kitchen... Take 1 tablet by mouth once a day  Orders: [facility]- Est  Level 4 ([address]214)  BP today: 128/85 Prior BP: 139/104 (04/18/2009)  Labs Reviewed: K+: 4.3 (04/18/2009) Creat: : 0.89 (04/18/2009)   Chol: 169 (11/17/2008)   HDL: 71 (11/17/2008)   LDL: 80 (11/17/2008)   TG: 89 (04/18/2009)  Problem # 6:  PROTEINURIA (ICD-791.0) Likely secondary to hypertension.  Will check creatinine today.  Consider ckd work up in future.  Complete Medication List: 1)  Lovaza 1 Gm Caps (Omega-3-acid ethyl esters) .... Take 2 capsule by mouth twice a day 2)  Allopurinol 300 Mg Tabs (Allopurinol) .... Take one by mouth daily 3)  Lopressor 100 Mg Tabs (Metoprolol tartrate) .Marland Kitchen.. 1 by mouth two times a day 4)  Spironolactone 50 Mg Tabs (Spironolactone) .... Take one by  mouth qdaily 5)  Norvasc 10 Mg Tabs (Amlodipine besylate) .... Take 1 tablet by mouth once a day 6)  Fluoxetine Hcl 40 Mg Caps (Fluoxetine hcl) .... Take 1 capsule by mouth once a day 7)  Depakote 500 Mg Tbec (Divalproex sodium) .... Take 1 tablet by mouth at bedtime 8)  Adult Aspirin Low Strength 81 Mg Chew (Aspirin) .Marland Kitchen.. 1 by mouth qday  Other Orders: Valproic Acid-[facility] ([id number])  Patient Instructions: 1)  Please come back to see me the first week of June. 2)  I'm not sure what is causing your abdominal pain/imbalance. 3)  We have drawn some blood work today to check function of liver and kidneys and thyroid.  I will call you at [phone] with the results of the blood work. 4)  I have also sent you to get a chest xray today. 5)  Hopefully you will feel better soon. Call clinic with questions.  Good to see you today.  Laboratory Results   Urine Tests  Date/Time  Received: Jul 11, 2009 2:46 PM  Date/Time Reported: Jul 11, 2009 3:22 PM    Routine Urinalysis   Color: brown Appearance: Clear Glucose: negative   (Normal Range: Negative) Bilirubin: small-icto weakly positive   (Normal Range: Negative) Ketone: trace (5)   (Normal Range: Negative) Spec. Gravity: 1.015   (Normal Range: 1.003-1.035) Blood: negative   (Normal Range: Negative) pH: 6.0   (Normal Range: 5.0-8.0) Protein: 30   (Normal Range: Negative) Urobilinogen: 1.0   (Normal Range: 0-1) Nitrite: negative   (Normal Range: Negative) Leukocyte Esterace: negative   (Normal Range: Negative)  Urine Microscopic WBC/HPF: occ RBC/HPF: occ Bacteria/HPF: trace Epithelial/HPF: 1-5    Comments: biochemical ............test performed by...........Marland Kitchen Terese Door, CMA micro ...............test performed by......Marland KitchenBonnie A. [place], MLS (ASCP)cm   Blood Tests   Date/Time Received: Jul 11, 2009 2:46 PM  Date/Time Reported: Jul 11, 2009 4:25 PM   SED rate: 71 mm/hr  Comments: ...............test performed by......Marland KitchenBonnie A. [place], MLS (ASCP)cm
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
## 2010-03-30 NOTE — Assessment & Plan Note (Signed)
Summary: f/u HTN   Vital Signs:  Patient profile:   50 year old male Height:      72 inches Weight:      249 pounds BMI:     33.89 Temp:     98.0 degrees F oral Pulse rate:   69 / minute BP sitting:   139 / 104  (right arm) Cuff size:   regular  Vitals Entered By: Tessie Fass CMA (April 18, 2009 2:06 PM)  Serial Vital Signs/Assessments:  Time      Position  BP       Pulse  Resp  Temp     By                     150/100                        Eustaquio Boyden  MD  CC: F/U Is Patient Diabetic? No Pain Assessment Patient in pain? no        Primary Care Provider:  Eustaquio Boyden  MD  CC:  F/U.  History of Present Illness: CC: f/u HTN  1. HTN- Stopped altace 2/2 angioedema with hospiatlazation and intubation.  Denies any cp, tightness, sob, ha, vision changes.  On norvasc, spironolactone 50, and metoprolol.  2. Hypertriglyceridemia- had elevated LFTs so stopped tricor, niacin.  only on lovaza now.  3. obesity - stable at 250 lbs  4. tobacco abuse- 1/2 ppd.  contemplative.  states will continue thinking about quitting.  5. preventative - screening colonoscopy due, pt states had trouble with insurance, but will call number to reschedule.  flu shot today  6. EtOH - continues to drink, several 6 packs occasionally when watching a game.  Habits & Providers  Alcohol-Tobacco-Diet     Tobacco Status: current     Tobacco Counseling: to quit use of tobacco products     Cigarette Packs/Day: 0.5  Current Medications (verified): 1)  Lovaza 1 Gm Caps (Omega-3-Acid Ethyl Esters) .... Take 2 Capsule By Mouth Twice A Day 2)  Allopurinol 300 Mg Tabs (Allopurinol) .... Take One By Mouth Daily 3)  Lopressor 100 Mg  Tabs (Metoprolol Tartrate) .Marland Kitchen.. 1 By Mouth Two Times A Day 4)  Spironolactone 50 Mg Tabs (Spironolactone) .... Take One By Mouth Qdaily 5)  Norvasc 10 Mg Tabs (Amlodipine Besylate) .... Take 1 Tablet By Mouth Once A Day 6)  Fluoxetine Hcl 40 Mg Caps (Fluoxetine  Hcl) .... Take 1 Capsule By Mouth Once A Day 7)  Depakote 500 Mg Tbec (Divalproex Sodium) .... Take 1 Tablet By Mouth At Bedtime 8)  Adult Aspirin Low Strength 81 Mg  Chew (Aspirin) .Marland Kitchen.. 1 By Mouth Qday  Allergies (verified): 1)  ! Ace Inhibitors 2)  Penicillin G Potassium (Penicillin G Potassium) PMH-FH-SH reviewed for relevance  Physical Exam  General:  Well-developed,well-nourished,in no acute distress; alert,appropriate and cooperative throughout examination, obese Lungs:  Normal respiratory effort, chest expands symmetrically. Lungs are clear to auscultation, no crackles or wheezes. Heart:  Normal rate and regular rhythm. S1 and S2 normal without gallop, murmur, click, rub or other extra sounds. Extremities:  no edema Skin:  rash resolved.   Impression & Recommendations:  Problem # 1:  TRANSAMINASES, SERUM, ELEVATED (ICD-790.4) check again today with ggt.  previously thought to be combination of antihyperlipidemics on board and EtOH.  again advised to cut back on EtOH. Orders: Comp Met-[facility] [id number]) Miscellaneous Lab Charge-[facility] [id number]) [facility]- Est  Level 4 (  [address])  Problem # 2:  HYPERTRIGLYCERIDEMIA (ICD-272.1)  recheck, consider restarting medications if LFTs back to normal range. The following medications were removed from the medication list:    Niaspan 500 Mg Tbcr (Niacin (antihyperlipidemic)) .Marland Kitchen... Take 2 tablet by mouth once a day    Tricor 145 Mg Tabs (Fenofibrate) .Marland Kitchen... Take one by mouth once daily His updated medication list for this problem includes:    Lovaza 1 Gm Caps (Omega-3-acid ethyl esters) .Marland Kitchen... Take 2 capsule by mouth twice a day  Orders: Direct LDL-[facility] ([id number]) Triglycerides-[facility] (84478) [facility]- Est  Level 4 ([address]78)  Labs Reviewed: SGOT: 141 (11/17/2008)   SGPT: 63 (11/17/2008)   HDL:71 (11/17/2008), 76 (11/10/2007)  LDL:80 (11/17/2008), 72 (11/10/2007)  Chol:169 (11/17/2008), 167 (11/10/2007)  Trig:88 (11/17/2008), 96 (11/10/2007)  Problem # 3:   TOBACCO DEPENDENCE (ICD-305.1)  again encouraged cessation.  Orders: [facility]- Est  Level 4 ([address])  Problem # 4:  HYPERTENSION, BENIGN SYSTEMIC (ICD-401.1) Assessment: Deteriorated asked pt to keep log of sugars, and to return next visit to discuss possible addition of 4th agent - consider HCTZ vs maxzide. His updated medication list for this problem includes:    Lopressor 100 Mg Tabs (Metoprolol tartrate) .Marland Kitchen... 1 by mouth two times a day    Spironolactone 50 Mg Tabs (Spironolactone) .Marland Kitchen... Take one by mouth qdaily    Norvasc 10 Mg Tabs (Amlodipine besylate) .Marland Kitchen... Take 1 tablet by mouth once a day  Orders: Comp Met-[facility] ([id number]) [facility]- Est  Level 4 ([address])  BP today: 139/104 Prior BP: 130/90 (09/07/2008)  Labs Reviewed: K+: 4.6 (11/17/2008) Creat: : 1.10 (11/17/2008)   Chol: 169 (11/17/2008)   HDL: 71 (11/17/2008)   LDL: 80 (11/17/2008)   TG: 88 (11/17/2008)  Complete Medication List: 1)  Lovaza 1 Gm Caps (Omega-3-acid ethyl esters) .... Take 2 capsule by mouth twice a day 2)  Allopurinol 300 Mg Tabs (Allopurinol) .... Take one by mouth daily 3)  Lopressor 100 Mg Tabs (Metoprolol tartrate) .Marland Kitchen.. 1 by mouth two times a day 4)  Spironolactone 50 Mg Tabs (Spironolactone) .... Take one by mouth qdaily 5)  Norvasc 10 Mg Tabs (Amlodipine besylate) .... Take 1 tablet by mouth once a day 6)  Fluoxetine Hcl 40 Mg Caps (Fluoxetine hcl) .... Take 1 capsule by mouth once a day 7)  Depakote 500 Mg Tbec (Divalproex sodium) .... Take 1 tablet by mouth at bedtime 8)  Adult Aspirin Low Strength 81 Mg Chew (Aspirin) .Marland Kitchen.. 1 by mouth qday  Other Orders: Influenza Vaccine NON MCR ([address]) Colonoscopy (Colon)  Patient Instructions: 1)  Return in 2 months for f/u BP. 2)  Make an appointment to schedule your colonoscopy. 3)  Keep taking the blood pressure (norvasc, metoprolol and spironolactone) and cholesterol medications (only lovaza for cholesterol). 4)  Keep thinking about quitting smoking - the  best thing you can do for your health! 5)  Call clinic with any questions.   Prevention & Chronic Care Immunizations   Influenza vaccine: Fluvax Non-MCR  (04/18/2009)   Influenza vaccine due: 10/27/2009    Tetanus booster: 06/27/2003: Done.   Tetanus booster due: 06/26/2013    Pneumococcal vaccine: Pneumovax  (02/26/2007)   Pneumococcal vaccine due: None  Colorectal Screening   Hemoccult: Not documented   Hemoccult action/deferral: Not indicated  (09/07/2008)    Colonoscopy: Not documented   Colonoscopy action/deferral: GI Referral  (04/18/2009)  Other Screening   PSA: 0.28  (02/26/2007)   PSA action/deferral: Discussion deferred  (09/07/2008)   PSA due due: 02/2008  Smoking status: current  (04/18/2009)   Smoking cessation counseling: yes  (06/11/2007)  Lipids   Total Cholesterol: 169  (11/17/2008)   LDL: 80  (11/17/2008)   LDL Direct: Not documented   HDL: 71  (11/17/2008)   Triglycerides: 88  (11/17/2008)    SGOT (AST): 141  (11/17/2008)   SGPT (ALT): 63  (11/17/2008) CMP ordered    Alkaline phosphatase: 71  (11/17/2008)   Total bilirubin: 1.0  (11/17/2008)    Lipid flowsheet reviewed?: Yes   Progress toward LDL goal: Unchanged    Stage of readiness to change (lipid management): Preparation  Hypertension   Last Blood Pressure: 139 / 104  (04/18/2009)   Serum creatinine: 1.10  (11/17/2008)   Serum potassium 4.6  (11/17/2008) CMP ordered     Hypertension flowsheet reviewed?: Yes   Progress toward BP goal: Deteriorated  Self-Management Support :   Personal Goals (by the next clinic visit) :      Personal blood pressure goal: 140/90  (04/18/2009)     Personal LDL goal: 100  (04/18/2009)    Hypertension self-management support: BP self-monitoring log, Written self-care plan, Education handout  (04/18/2009)   Hypertension self-care plan printed.   Hypertension education handout printed    Lipid self-management support: Not documented     Lipid  self-management support not done because: Good outcomes  (04/18/2009)   Nursing Instructions: Give Flu vaccine today Screening colonoscopy ordered    Influenza Vaccine    Vaccine Type: Fluvax Non-MCR    Site: right deltoid    Mfr: [employer]Smith[employer]    Dose: 0.5 ml    Route: IM    Given by: Arlyss Repress CMA,    Exp. Date: 08/26/2010    Lot #: [id number]    VIS given: 09/19/06 version given April 18, 2009.  Flu Vaccine Consent Questions    Do you have a history of severe allergic reactions to this vaccine? no    Any prior history of allergic reactions to egg and/or gelatin? no    Do you have a sensitivity to the preservative Thimersol? no    Do you have a past history of Guillan-Barre Syndrome? no    Do you currently have an acute febrile illness? no    Have you ever had a severe reaction to latex? no    Vaccine information given and explained to patient? yes

## 2010-03-30 NOTE — Progress Notes (Signed)
  Phone Note Outgoing Call   Call placed by: Ardeen Garland, MD  Call placed to: Patient Summary of Call: Called patient to discuss results of recent CXR and labwork and ask a few more questions about how he is doing.  Asked him to call us back with a good time (between 8:30 and 5) and number to contact him at.  Informed him I am not in clinic this afternoon and so may not be able to contact him until tomorrow.   Initial call taken by: Lamar Laundry, MD May 24th, 2011 13:45PM  Follow-up for Phone Call        Called patient again.  GAve results of CXR, CBC, Folate, etc.  Still having pain in left posterolateral back/side, which corresponds to location of broken ribs.  Can't remember exactly whe he may have broken them but feels had to be during one of his falls.  Feeling better, falling less.  Denies CP, SOB, weakness, dizziness.  Vocalizes that he has appt with Dr. Reece Agar on June 2nd and wants to talk to him more about the vitamins/anemia/broken ribs then. Advised that if he becomes short of breath, pain worsens, he gets fevers, weakness, dizziness he should call and come in given he has broken ribs with pleural hematoma.  He expressed understanding.  Follow-up by: Lamar Laundry, MD May 25th, 2011 2:30PM

## 2010-03-30 NOTE — Progress Notes (Signed)
Summary: Test Res - folate deficiency  Phone Note Call from Patient Call back at Home Phone [phone]   Caller: Patient Summary of Call: Checking on lab results and x-ray results. Initial call taken by: Clydell Hakim,  Jul 20, 2009 8:54 AM  Follow-up for Phone Call        see previous phone note started on 07/19/09.     Appended Document: Test Res called him & told him that he had spoken with md about results. asked if he had further questions. stated he did not

## 2010-03-30 NOTE — Progress Notes (Signed)
Summary: refill completed  Phone Note Refill Request   Pt need refill o n Metoprolol Tar-tr 100mg      Prescriptions: LOPRESSOR 100 MG  TABS (METOPROLOL TARTRATE) 1 by mouth two times a day  #60 x 5   Entered and Authorized by:   Doree Albee MD   Signed by:   Doree Albee MD on 03/03/2010   Method used:   [facility]  [address] [id number]       [address]       [place], [place]  [address]       Ph: [phone] or [phone]       Fax: [phone]   RxID:   [id number]

## 2010-03-30 NOTE — Letter (Signed)
Summary: Results Follow-up Letter  [facility]  [address]   [place], [place] [address]   Phone: [phone]  Fax: [phone]    08/25/2009  [address] [place], [place]  [address]  Dear Mr. NICOSON,   The following are the results of your recent test(s):  Test     Result     Your triglyceride level was normal.  Continue Lovaza.    Your blood sugar was again a bit elevated (as it had been years past).  This is something your new doctor will keep an eye on.  Your blood count has come back to normal, but you still need to continue taking folate and the other vitamins.  Your liver function is very near normal, however I'd recommend you continue to abstain from alcohol.  I'm glad you are feeling better.  Return around August or September to meet your new doctor, Dr. Alvester Morin.  It's been a pleasure being your doctor.  If you have any questions, feel free to give [place] a call. _________________________________________________________  _________________________________________________________   Sincerely,  Eustaquio Boyden  MD [facility]            Appended Document: Results Follow-up Letter mailed.

## 2010-04-05 NOTE — Assessment & Plan Note (Addendum)
Summary: f/u [facility]   Allergies: 1)  ! Ace Inhibitors 2)  Penicillin G Potassium (Penicillin G Potassium)   Complete Medication List: 1)  Aspirin 81 Mg Chew (Aspirin) .... One daily 2)  Norvasc 10 Mg Tabs (Amlodipine besylate) .... Take 1 tablet by mouth once a day 3)  Lopressor 100 Mg Tabs (Metoprolol tartrate) .Marland Kitchen.. 1 by mouth two times a day 4)  Spironolactone 50 Mg Tabs (Spironolactone) .... Take one by mouth qdaily 5)  Allopurinol 300 Mg Tabs (Allopurinol) .... Take one by mouth daily 6)  Lovaza 1 Gm Caps (Omega-3-acid ethyl esters) .... Take 2 capsule by mouth twice a day 7)  Fluoxetine Hcl 40 Mg Caps (Fluoxetine hcl) .... Take 1 capsule by mouth once a day 8)  Folic Acid 1 Mg Tabs (Folic acid) .... One daily 9)  Vitamin B-1 100 Mg Tabs (Thiamine hcl) .... One daily 10)  Multivitamins Caps (Multiple vitamin) .... One daily 11)  Tramadol Hcl 50 Mg Tabs (Tramadol hcl) .... One by mouth two times a day as needed pain 12)  Lithium Carbonate 450 Mg Cr-tabs (Lithium carbonate) .Marland Kitchen.. 1 by mouth daily mental health  Other Orders: No Charge Patient Arrived (NCPA0) (NCPA0)   Orders Added: 1)  No Charge Patient Arrived (NCPA0) [NCPA0]

## 2010-04-21 ENCOUNTER — Telehealth: Payer: Self-pay | Admitting: Family Medicine

## 2010-04-21 NOTE — Telephone Encounter (Signed)
Spoke with pt & told him I cannot fill this & I have sent it to md. He will fill it. It will not be today as he is not here. I have sent the md a message.Elijah Birk, Esperanza Richters

## 2010-04-21 NOTE — Telephone Encounter (Signed)
Lm that I will send this to his md to fill. Will not be able to get this today as md is not in, but will send him the request..Marland KitchenFaustino [name]

## 2010-04-21 NOTE — Telephone Encounter (Signed)
Pt trying to get refill on spironolactone 50 mg, pharmacy was faxing it to Dr. Sharen Hones whom was pts previous doctor, pt is at [facility]/[address]

## 2010-04-26 ENCOUNTER — Other Ambulatory Visit: Payer: Self-pay | Admitting: Family Medicine

## 2010-04-26 DIAGNOSIS — I10 Essential (primary) hypertension: Secondary | ICD-10-CM

## 2010-04-26 MED ORDER — SPIRONOLACTONE 50 MG PO TABS: 50.0000 mg | ORAL_TABLET | Freq: Every day | ORAL | Status: DC

## 2010-04-26 NOTE — Telephone Encounter (Signed)
Refill for spironolactone completed with 1 refill. However, pt will need to come in for primary MD visit for additional refills. Rx placed up front for patient to pick up.  Doree Albee MD

## 2010-04-27 NOTE — Telephone Encounter (Signed)
rx called to pharmacy. Printed RX shredded. Patient notified to schedule appointment.

## 2010-05-12 ENCOUNTER — Encounter: Payer: Self-pay | Admitting: Family Medicine

## 2010-05-12 ENCOUNTER — Ambulatory Visit (INDEPENDENT_AMBULATORY_CARE_PROVIDER_SITE_OTHER): Payer: BC Managed Care – PPO | Admitting: Family Medicine

## 2010-05-12 DIAGNOSIS — I10 Essential (primary) hypertension: Secondary | ICD-10-CM

## 2010-05-12 DIAGNOSIS — R160 Hepatomegaly, not elsewhere classified: Secondary | ICD-10-CM | POA: Insufficient documentation

## 2010-05-12 DIAGNOSIS — F17200 Nicotine dependence, unspecified, uncomplicated: Secondary | ICD-10-CM

## 2010-05-12 DIAGNOSIS — Z7289 Other problems related to lifestyle: Secondary | ICD-10-CM

## 2010-05-12 DIAGNOSIS — D529 Folate deficiency anemia, unspecified: Secondary | ICD-10-CM

## 2010-05-12 DIAGNOSIS — D539 Nutritional anemia, unspecified: Secondary | ICD-10-CM

## 2010-05-12 DIAGNOSIS — M1A00X0 Idiopathic chronic gout, unspecified site, without tophus (tophi): Secondary | ICD-10-CM

## 2010-05-12 DIAGNOSIS — F1010 Alcohol abuse, uncomplicated: Secondary | ICD-10-CM

## 2010-05-12 DIAGNOSIS — M109 Gout, unspecified: Secondary | ICD-10-CM

## 2010-05-12 DIAGNOSIS — Z1211 Encounter for screening for malignant neoplasm of colon: Secondary | ICD-10-CM

## 2010-05-12 DIAGNOSIS — E785 Hyperlipidemia, unspecified: Secondary | ICD-10-CM

## 2010-05-12 DIAGNOSIS — E781 Pure hyperglyceridemia: Secondary | ICD-10-CM

## 2010-05-12 DIAGNOSIS — F319 Bipolar disorder, unspecified: Secondary | ICD-10-CM

## 2010-05-12 LAB — CONVERTED CEMR LAB
AST: 17 units/L (ref 0–37)
Albumin: 4.1 g/dL (ref 3.5–5.2)
Alkaline Phosphatase: 93 units/L (ref 39–117)
BUN: 18 mg/dL (ref 6–23)
HDL: 47 mg/dL (ref >39)
Hemoglobin: 14.8 g/dL (ref 13.0–17.0)
LDL Cholesterol: 114 mg/dL — ABNORMAL HIGH (ref 0–99)
MCHC: 33 g/dL (ref 30.0–36.0)
Platelets: 340 10*3/uL (ref 150–400)
Potassium: 4.8 meq/L (ref 3.5–5.3)
RDW: 12.8 % (ref 11.5–15.5)
Total CHOL/HDL Ratio: 4
Uric Acid, Serum: 5.9 mg/dL (ref 4.0–7.8)

## 2010-05-12 LAB — CBC
HCT: 44.8 % (ref 39.0–52.0)
MCH: 34.7 pg — ABNORMAL HIGH (ref 26.0–34.0)
MCHC: 33 g/dL (ref 30.0–36.0)
MCV: 104.9 fL — ABNORMAL HIGH (ref 78.0–100.0)
RDW: 12.8 % (ref 11.5–15.5)

## 2010-05-12 LAB — LIPID PANEL
HDL: 47 mg/dL (ref >39)
LDL Cholesterol: 114 mg/dL — ABNORMAL HIGH (ref 0–99)
Total CHOL/HDL Ratio: 4 Ratio
Triglycerides: 139 mg/dL (ref <150)
VLDL: 28 mg/dL (ref 0–40)

## 2010-05-12 LAB — COMPREHENSIVE METABOLIC PANEL
AST: 17 U/L (ref 0–37)
Alkaline Phosphatase: 93 U/L (ref 39–117)
BUN: 18 mg/dL (ref 6–23)
Glucose, Bld: 106 mg/dL — ABNORMAL HIGH (ref 70–99)
Total Bilirubin: 0.6 mg/dL (ref 0.3–1.2)

## 2010-05-12 MED ORDER — OMEGA-3-ACID ETHYL ESTERS 1 G PO CAPS: 2.0000 g | ORAL_CAPSULE | Freq: Two times a day (BID) | ORAL | Status: DC

## 2010-05-12 MED ORDER — ASPIRIN 81 MG PO CHEW: 81.0000 mg | CHEWABLE_TABLET | Freq: Every day | ORAL | Status: DC

## 2010-05-12 MED ORDER — AMLODIPINE BESYLATE 10 MG PO TABS: 10.0000 mg | ORAL_TABLET | Freq: Every day | ORAL | Status: DC

## 2010-05-12 MED ORDER — FLUOXETINE HCL 40 MG PO CAPS: 40.0000 mg | ORAL_CAPSULE | Freq: Every day | ORAL | Status: DC

## 2010-05-12 MED ORDER — TRAMADOL HCL 50 MG PO TABS: 50.0000 mg | ORAL_TABLET | Freq: Two times a day (BID) | ORAL | Status: DC | PRN

## 2010-05-12 MED ORDER — ALLOPURINOL 300 MG PO TABS: 300.0000 mg | ORAL_TABLET | Freq: Every day | ORAL | Status: DC

## 2010-05-12 MED ORDER — MULTIVITAMINS PO CAPS: 1.0000 | ORAL_CAPSULE | Freq: Every day | ORAL | Status: DC

## 2010-05-12 MED ORDER — LITHIUM CARBONATE ER 450 MG PO TBCR: 450.0000 mg | EXTENDED_RELEASE_TABLET | Freq: Every day | ORAL | Status: DC

## 2010-05-12 MED ORDER — FOLIC ACID 1 MG PO TABS: 1.0000 mg | ORAL_TABLET | Freq: Every day | ORAL | Status: DC

## 2010-05-12 MED ORDER — VITAMIN B-1 100 MG PO TABS: 100.0000 mg | ORAL_TABLET | Freq: Every day | ORAL | Status: DC

## 2010-05-12 MED ORDER — METOPROLOL TARTRATE 100 MG PO TABS: 100.0000 mg | ORAL_TABLET | Freq: Two times a day (BID) | ORAL | Status: DC

## 2010-05-12 NOTE — Patient Instructions (Signed)
Hypertriglyceridemia  Diet for High blood levels of Triglycerides Most fats in food are triglycerides. Triglycerides in your blood are stored as fat in your body. High levels of triglycerides in your blood may put you at a greater risk for heart disease and stroke.  Normal triglyceride levels are less than 150 mg/dL. Borderline high levels are 150-199 mg/dl. High levels are 200 - 499 mg/dL, and very high triglyceride levels are greater than 500 mg/dL. The decision to treat high triglycerides is generally based on the level. For people with borderline or high triglyceride levels, treatment includes weight loss and exercise. Drugs are recommended for people with very high triglyceride levels. Many people who need treatment for high triglyceride levels have metabolic syndrome. This syndrome is a collection of disorders that often include: insulin resistance, high blood pressure, blood clotting problems, high cholesterol and triglycerides. TESTING PROCEDURE FOR TRIGLYCERIDES  You should not eat 4 hours before getting your triglycerides measured. The normal range of triglycerides is between 10 and 250 milligrams per deciliter (mg/dl). Some people may have extreme levels (1000 or above), but your triglyceride level may be too high if it is above 150 mg/dl, depending on what other risk factors you have for heart disease.   People with high blood triglycerides may also have high blood cholesterol levels. If you have high blood cholesterol as well as high blood triglycerides, your risk for heart disease is probably greater than if you only had high triglycerides. High blood cholesterol is one of the main risk factors for heart disease.  CHANGING YOUR DIET  Your weight can affect your blood triglyceride level. If you are more than 20% above your ideal body weight, you may be able to lower your blood triglycerides by losing weight. Eating less and exercising regularly is the best way to combat this. Fat provides  more calories than any other food. The best way to lose weight is to eat less fat. Only 30% of your total calories should come from fat. Less than 7% of your diet should come from saturated fat. A diet low in fat and saturated fat is the same as a diet to decrease blood cholesterol. By eating a diet lower in fat, you may lose weight, lower your blood cholesterol, and lower your blood triglyceride level.  Eating a diet low in fat, especially saturated fat, may also help you lower your blood triglyceride level. Ask your dietitian to help you figure how much fat you can eat based on the number of calories your caregiver has prescribed for you.  Exercise, in addition to helping with weight loss may also help lower triglyceride levels.   Alcohol can increase blood triglycerides. You may need to stop drinking alcoholic beverages.   Too much carbohydrate in your diet may also increase your blood triglycerides. Some complex carbohydrates are necessary in your diet. These may include bread, rice, potatoes, other starchy vegetables and cereals.   Reduce "simple" carbohydrates. These may include pure sugars, candy, honey, and jelly without losing other nutrients. If you have the kind of high blood triglycerides that is affected by the amount of carbohydrates in your diet, you will need to eat less sugar and less high-sugar foods. Your caregiver can help you with this.   Adding 2-4 grams of fish oil (EPA+ DHA) may also help lower triglycerides. Speak with your caregiver before adding any supplements to your regimen.  Following the Diet  Maintain your ideal weight. Your caregivers can help you with a diet. Generally,   eating less food and getting more exercise will help you lose weight. Joining a weight control group may also help. Ask your caregivers for a good weight control group in your area.  Eat low-fat foods instead of high-fat foods. This can help you lose weight too.  These foods are lower in fat. Eat MORE  of these:   Dried beans, peas, and lentils.   Egg whites.   Low-fat cottage cheese.   Fish.   Lean cuts of meat, such as round, sirloin, rump, and flank (cut extra fat off meat you fix).   Whole grain breads, cereals and pasta.   Skim and nonfat dry milk.   Low-fat yogurt.   Poultry without the skin.   Cheese made with skim or part-skim milk, such as mozzarella, parmesan, farmers', ricotta, or pot cheese.   These are higher fat foods. Eat LESS of these:   Whole milk and foods made from whole milk, such as American, blue, cheddar, monterey jack, and swiss cheese   High-fat meats, such as luncheon meats, sausages, knockwurst, bratwurst, hot dogs, ribs, corned beef, ground pork, and regular ground beef.   Fried foods.  Limit saturated fats in your diet. Substituting unsaturated fat for saturated fat may decrease your blood triglyceride level. You will need to read package labels to know which products contain saturated fats.  These foods are high in saturated fat. Eat LESS of these:   Fried pork skins.  Whole milk.   Skin and fat from poultry.   Palm oil.   Butter.   Shortening.   Cream cheese.   Berniece Salines.   Margarines and baked goods made from listed oils.   Vegetable shortenings.   Chitterlings.  Fat from meats.   Coconut oil.   Palm kernel oil.   Lard.   Cream.   Sour cream.   Fatback.   Coffee whiteners and non-dairy creamers made with these oils.   Cheese made from whole milk.   Use unsaturated fats (both polyunsaturated and monounsaturated) moderately. Remember, even though unsaturated fats are better than saturated fats; you still want a diet low in total fat.  These foods are high in unsaturated fat:   Canola oil.  Sunflower oil.   Mayonnaise.   Almonds.   Peanuts.   Pine nuts.   Margarines made with these oils.   Safflower oil.  Olive oil.   Avocados.   Cashews.   Peanut butter.   Sunflower seeds.   Soybean  oil.  Peanut oil.   Olives.   Pecans.   Walnuts.   Pumpkin seeds.   Avoid sugar and other high-sugar foods. This will decrease carbohydrates without decreasing other nutrients. Sugar in your food goes rapidly to your blood. When there is excess sugar in your blood, your liver may use it to make more triglycerides. Sugar also contains calories without other important nutrients.  Eat LESS of these:   Sugar, brown sugar, powdered sugar, jam, jelly, preserves, honey, syrup, molasses, pies, candy, cakes, cookies, frosting, pastries, colas, soft drinks, punches, fruit drinks, and regular gelatin.   Avoid alcohol. Alcohol, even more than sugar, may increase blood triglycerides. In addition, alcohol is high in calories and low in nutrients. Ask for sparkling water, or a diet soft drink instead of an alcoholic beverage.  Suggestions for planning and preparing meals   Bake, broil, grill or roast meats instead of frying.   Remove fat from meats and skin from poultry before cooking.   Add spices, herbs, lemon juice  or vinegar to vegetables instead of salt, rich sauces or gravies.   Use a non-stick skillet without fat or use no-stick sprays.   Cool and refrigerate stews and broth. Then remove the hardened fat floating on the surface before serving.   Refrigerate meat drippings and skim off fat to make low-fat gravies.   Serve more fish.   Use less butter, margarine and other high-fat spreads on bread or vegetables.   Use skim or reconstituted non-fat dry milk for cooking.   Cook with low-fat cheeses.   Substitute low-fat yogurt or cottage cheese for all or part of the sour cream in recipes for sauces, dips or congealed salads.   Use half yogurt/half mayonnaise in salad recipes.   Substitute evaporated skim milk for cream. Evaporated skim milk or reconstituted non-fat dry milk can be whipped and substituted for whipped cream in certain recipes.   Choose fresh fruits for dessert instead  of high-fat foods such as pies or cakes. Fruits are naturally low in fat.  When Dining Out   Order low-fat appetizers such as fruit or vegetable juice, pasta with vegetables or tomato sauce.   Select clear, rather than cream soups.   Ask that dressings and gravies be served on the side. Then use less of them.   Order foods that are baked, broiled, poached, steamed, stir-fried, or roasted.   Ask for margarine instead of butter, and use only a small amount.   Drink sparkling water, unsweetened tea or coffee, or diet soft drinks instead of alcohol or other sweet beverages.  QUESTIONS AND ANSWERS ABOUT OTHER FATS IN THE BLOOD:  SATURATED FAT, TRANS FAT, AND CHOLESTEROL What is trans fat? Trans fat is a type of fat that is formed when vegetable oil is hardened through a process called hydrogenation. This process helps makes foods more solid, gives them shape, and prolongs their shelf life. Trans fats are also called hydrogenated or partially hydrogenated oils.  What do saturated fat, trans fat, and cholesterol in foods have to do with heart disease? Saturated fat, trans fat, and cholesterol in the diet all raise the level of LDL "bad" cholesterol in the blood. The higher the LDL cholesterol, the greater the risk for coronary heart disease (CHD). Saturated fat and trans fat raise LDL similarly.  What foods contain saturated fat, trans fat, and cholesterol? High amounts of saturated fat are found in animal products, such as fatty cuts of meat, chicken skin, and full-fat dairy products like butter, whole milk, cream, and cheese, and in tropical vegetable oils such as palm, palm kernel, and coconut oil. Trans fat is found in some of the same foods as saturated fat, such as vegetable shortening, some margarines (especially hard or stick margarine), crackers, cookies, baked goods, fried foods, salad dressings, and other processed foods made with partially hydrogenated vegetable oils. Small amounts of trans  fat also occur naturally in some animal products, such as milk products, beef, and lamb. Foods high in cholesterol include liver, other organ meats, egg yolks, shrimp, and full-fat dairy products. How can I use the new food label to make heart-healthy food choices? Check the Nutrition Facts panel of the food label. Choose foods lower in saturated fat, trans fat, and cholesterol. For saturated fat and cholesterol, you can also use the Percent Daily Value (%DV): 5% DV or less is low, and 20% DV or more is high. (There is no %DV for trans fat.) Use the Nutrition Facts panel to choose foods low in saturated fat   and cholesterol, and if the trans fat is not listed, read the ingredients and limit products that list shortening or hydrogenated or partially hydrogenated vegetable oil, which tend to be high in trans fat. POINTS TO REMEMBER: YOU NEED A LITTLE TLC (THERAPEUTIC LIFESTYLE CHANGES)  Discuss your risk for heart disease with your caregivers, and take steps to reduce risk factors.   Change your diet. Choose foods that are low in saturated fat, trans fat, and cholesterol.   Add exercise to your daily routine if it is not already being done. Participate in physical activity of moderate intensity, like brisk walking, for at least 30 minutes on most, and preferably all days of the week. No time? Break the 30 minutes into three, 10-minute segments during the day.   Stop smoking. If you do smoke, contact your caregiver to discuss ways in which they can help you quit.   Do not use street drugs.   Maintain a normal weight.   Maintain a healthy blood pressure.   Keep up with your blood work for checking the fats in your blood as directed by your caregiver.  Document Released: 12/01/2003 Document Re-Released: 08/02/2009 [facility] Patient Information 2011 [place], [place].Alcohol and Nutrition Nutrition serves two purposes. It provides energy. It also maintains body structure and function. Food supplies  energy. It also provides the building blocks needed to replace worn or damaged cells. Alcoholics often eat poorly. This limits their supply of essential nutrients. This affects energy supply and structure maintenance. Alcohol also affects the body's nutrients in:  Digestion.   Storage.   Using and getting rid of waste products.  IMPAIRMENT OF NUTRIENT DIGESTION AND UTILIZATION   Once ingested, food must be broken down into small components (digested). Then it is available for energy. It helps maintain body structure and function. Digestion begins in the mouth. It continues in the stomach and intestines, with help from the pancreas. The nutrients from digested food are absorbed from the intestines into the blood. Then they are carried to the liver. The liver prepares nutrients for:   Immediate use.   Storage and future use.   Alcohol inhibits the breakdown of nutrients into usable molecules.   It decreases secretion of digestive enzymes from the pancreas.   Alcohol impairs nutrient absorption by damaging the cells lining the stomach and intestines.   It also interferes with moving some nutrients into the blood.   In addition, nutritional deficiencies themselves may lead to further absorption problems.   For example, folate deficiency changes the cells that line the small intestine. This impairs how water is absorbed. It also affects absorbed nutrients. These include glucose, sodium, and additional folate.   Even if nutrients are digested and absorbed, alcohol can prevent them from being fully used. It changes their transport, storage, and excretion. Impaired utilization of nutrients by alcoholics is indicated by:   Decreased liver stores of vitamins such as vitamin A   Increased excretion of nutrients such as fat.  ALCOHOL AND ENERGY SUPPLY   Three basic nutritional components found in food are:   Carbohydrates.   Proteins.   Fats.   These are used as energy. Some alcoholics  take in as much as 50 % of their total daily calories from alcohol. They often neglect important foods.   Even when enough food is eaten, alcohol can impair the ways the body controls blood sugar (glucose) levels. It may either increase or decrease blood sugar.   In non-diabetic alcoholics, increased blood sugar (hyperglycemia) is  caused by poor insulin secretion. It is usually temporary.   Decreased blood sugar (hypoglycemia) can cause serious injury even if this condition is short lived. Low blood sugar can happen when a fasting or malnourished person drinks alcohol. When there is no food to supply energy, stored sugar is used up. The products of alcohol inhibit forming glucose from other compounds such as amino acids. As a result, alcohol causes the brain and other body tissue to lack glucose. It is needed for energy and function.   Alcohol is an energy source. But how the body processes and uses the energy from alcohol is complex. Also, when alcohol is substituted for carbohydrates, subjects tend to lose weight. This indicates that they get less energy from alcohol than from food.  ALCOHOL - MAINTAINING CELL STRUCTURE AND FUNCTION  Structure  Cells are made mostly of protein. So an adequate protein diet is important for maintaining cell structure. This is especially true if cells are being damaged. Research indicates that alcohol affects protein nutrition by causing impaired:  Digestion of proteins to amino acids.   Processing of amino acids by the small intestine and liver.   Synthesis of proteins from amino acids.  Protein secretion by the liver.   Function  Nutrients are essential for the body to function well. They provide the tools that the body needs to work well:   Proteins.   Vitamins.   Minerals.  Alcohol can disrupt body function. It may cause nutrient deficiencies. And it may interfere with the way nutrients are processed. Vitamins   Vitamins are essential to maintain  growth and normal metabolism. They regulate many of the body`s processes. Chronic heavy drinking causes deficiencies in many vitamins. This is caused by eating less. And, in some cases, vitamins may be poorly absorbed. For example, alcohol inhibits fat absorption. It impairs how the vitamins A, E, and D are normally absorbed along with dietary fats. Not enough vitamin A may cause night blindness. Not enough vitamin D may cause softening of the bones.   Some alcoholics lack vitamins A, C, D, E, K, and the B vitamins. These are all involved in wound healing and cell maintenance. In particular, because vitamin K is necessary for blood clotting, lacking that vitamin can cause delayed clotting. The result is excess bleeding. Lacking other vitamins involved in brain function may cause severe neurological damage.  Minerals  Deficiencies of minerals such as calcium, magnesium, iron, and zinc are common in alcoholics. The alcohol itself does not seem to affect how these minerals are absorbed. Rather, they seem to occur secondary to other alcohol-related problems, such as:  Less calcium absorbed.   Not enough magnesium.   More urinary excretion.   Vomiting.   Diarrhea.   Not enough iron due to gastrointestinal bleeding.   Not enough zinc or losses related to other nutrient deficiencies.   Mineral deficiencies can cause a variety of medical consequences. These range from calcium-related bone disease to zinc-related night blindness and skin lesions.  ALCOHOL, MALNUTRITION, AND MEDICAL COMPLICATIONS  Liver Disease   Alcoholic liver damage is caused primarily by alcohol itself. But poor nutrition may increase the risk of alcohol-related liver damage. For example, nutrients normally found in the liver are known to be affected by drinking alcohol. These include carotenoids, which are the major sources of vitamin A, and vitamin E compounds. Decreases in such nutrients may play some role in alcohol-related  liver damage.  Pancreatitis   Research suggests that malnutrition may increase the  risk of developing alcoholic pancreatitis. Research suggests that a diet lacking in protein may increase alcohol's damaging effect on the pancreas.  Brain   Nutritional deficiencies may have severe effects on brain function. These may be permanent Specifically, thiamine deficiencies are often seen in alcoholics. They can cause severe neurological problems. These include:   Impaired movement.   Memory loss seen in Wernicke/Korsakoff syndrome.  Pregnancy   Alcohol has toxic effects on fetal development. It causes alcohol-related birth defects. They include fetal alcohol syndrome. Alcohol itself is toxic to the fetus. Also, the nutritional deficiency can affect how the fetus develops. That may compound the risk of developmental damage.   Nutritional needs during pregnancy are 10 to 30 % greater than normal. Food intake can increase by as much as 140 % to cover the needs of both mother and fetus. An alcoholic mother`s nutritional problems may adversely affect the nutrition of the fetus. And alcohol itself can also restrict nutrition flow to the fetus.  NUTRITIONAL STATUS OF ALCOHOLICS  Techniques for assessing nutritional status include:  Taking body measurements to estimate fat reserves. They include:   Weight.  Height.   Mass.  Skin fold thickness.   Performing blood analysis to provide measurements of circulating:  Proteins.   Vitamins.   Minerals.   These techniques tend to be imprecise. For many nutrients, there is no clear "cut-off" point that would allow an accurate definition of deficiency. So assessing the nutritional status of alcoholics is limited by these techniques. Dietary status may provide information about the risk of developing nutritional problems. Dietary status is assessed by:   Taking patients' dietary histories.   Evaluating the amount and types of food they are eating.   It  is difficult to determine what exact amount of alcohol begins to have damaging effects on nutrition. In general, moderate drinkers have two drinks or less per day. They seem to be at little risk for nutritional problems. Various medical disorders begin to appear at greater levels.   Research indicates that the majority of even the heaviest drinkers have few obvious nutritional deficiencies. Many alcoholics who are hospitalized for medical complications of their disease do have severe malnutrition. Alcoholics tend to eat poorly. Often they eat less than the amounts of food necessary to provide enough:   Carbohydrates.  Protein.   Fat.  Vitamins A and C.   B vitamins.  Minerals like calcium and iron.   Of major concern is alcohol's effect on digesting food and use of nutrients. It may shift a mildly malnourished person toward severe malnutrition. Document Released: 12/07/2004 Document Re-Released: 08/02/2009 [facility] Patient Information 2011 [place], [place].Hypertension (High Blood Pressure) As your heart beats, it forces blood through your arteries. This force is your blood pressure. If the pressure is too high, it is called hypertension (HTN) or high blood pressure. HTN is dangerous because you may have it and not know it. High blood pressure may mean that your heart has to work harder to pump blood. Your arteries may be narrow or stiff. The extra work puts you at risk for heart disease, stroke, and other problems.  Blood pressure consists of two numbers, a higher number over a lower, 110/72, for example. It is stated as "110 over 72." The ideal is below 120 for the top number (systolic) and under 80 for the bottom (diastolic). Write down your blood pressure today. You should pay close attention to your blood pressure if you have certain conditions such as:  Heart failure.  Prior heart attack.   Diabetes   Chronic kidney disease.   Prior stroke.   Multiple risk factors for heart disease.    To see if you have HTN, your blood pressure should be measured while you are seated with your arm held at the level of the heart. It should be measured at least twice. A one-time elevated blood pressure reading (especially in the Emergency Department) does not mean that you need treatment. There may be conditions in which the blood pressure is different between your right and left arms. It is important to see your caregiver soon for a recheck. Most people have essential hypertension which means that there is not a specific cause. This type of high blood pressure may be lowered by changing lifestyle factors such as:  Stress.  Smoking.   Lack of exercise.   Excessive weight.  Drug/tobacco/alcohol use.   Eating less salt.   Most people do not have symptoms from high blood pressure until it has caused damage to the body. Effective treatment can often prevent, delay or reduce that damage. TREATMENT Treatment for high blood pressure, when a cause has been identified, is directed at the cause. There are a large number of medications to treat HTN. These fall into several categories, and your caregiver will help you select the medicines that are best for you. Medications may have side effects. You should review side effects with your caregiver. If your blood pressure stays high after you have made lifestyle changes or started on medicines,   Your medication(s) may need to be changed.   Other problems may need to be addressed.   Be certain you understand your prescriptions, and know how and when to take your medicine.   Be sure to follow up with your caregiver within the time frame advised (usually within two weeks) to have your blood pressure rechecked and to review your medications.   If you are taking more than one medicine to lower your blood pressure, make sure you know how and at what times they should be taken. Taking two medicines at the same time can result in blood pressure that is too  low.  SEEK IMMEDIATE MEDICAL CARE IF YOU DEVELOP:  A severe headache, blurred or changing vision, or confusion.   Unusual weakness or numbness, or a faint feeling.   Severe chest or abdominal pain, vomiting, or breathing problems.  MAKE SURE YOU:   Understand these instructions.   Will watch your condition.   Will get help right away if you are not doing well or get worse.  Document Released: 02/12/2005 Document Re-Released: 08/02/2009 [facility] Patient Information 2011 [place], [place].

## 2010-05-14 DIAGNOSIS — F319 Bipolar disorder, unspecified: Secondary | ICD-10-CM | POA: Insufficient documentation

## 2010-05-14 NOTE — Assessment & Plan Note (Signed)
Curretnly asymptomatic. Will check CBC.

## 2010-05-14 NOTE — Assessment & Plan Note (Signed)
Pt w/ noted hepatomegaly in setting of extensive ETOH abuse and hx/o elevated transaminases. Will obtain RUQ ultrasound to assess for any cirrhotic changes. Stressed importance of ETOH cessation.

## 2010-05-14 NOTE — Assessment & Plan Note (Signed)
Previously abstaining as of 2011 documentation. Now with major ETOH abuse based on history. Stressed importance of ETOH cessation as to avoid liver damage. Will check CMET today.  Also, given hepatomegaly, will check RUQ ultrasound to assess for any cirrhotic changes.

## 2010-05-14 NOTE — Assessment & Plan Note (Signed)
Stable on allopurinol per pt. Will check uric acid level.

## 2010-05-14 NOTE — Progress Notes (Signed)
  Subjective:    Patient ID: [name] [name], male    DOB: 1961-01-03, 50 y.o.   MRN: [id number]  HPI 50 YOM w/ multiple medical problems here for new MD visit.  Pt denies any acute issues. HTN: Pt reports compliance with lopressor 100 bid as well as aldactone 50mg . Has not been checking blood presures at home. Pt denies any HA, CP, dyspnea, or vision changes.  Gout: Currently stable per pt. Most recent gout attack was "a long time"/>1 year ago per pt. Has been compliant with allopurinol per pt.  ETOH Abuse: pt reports drinking up to >1/2 case of beer daily; usually on the weekend. While "watching th game with the felllas" per pt. Pt also reports drinking at least 1/5th of liquor weekly. Pt denies any abdominal pain, LE edema,   Have you ever felt you should cut down on your drinking?No Have people annoyed you by criticising your drinking?No Have you ever felt bad or guilty about your drinking?No Have you ever had a drink first thing in the morning to steady your nerves or get rid of a hangover (eye-opener)?Yes  Smoking: Currently 1/2 ppd smoker. Does not desire to quit.     Review of Systems See HPI     Objective:   Physical Exam Gen: up in chair, NAD HEENT: NCAT, EOMI, no scleral icterus, TMs clear bilaterally CV: RRR, no murmurs auscultated PULM: CTAB, no wheezes, rales, rhoncii ABD: mildly obese, soft, +hepatomegaly w/ liver edge measuring 4-5 cm below costal margin, no visible caput medusa EXT: 2+ peripheral pulses, no edema          Assessment & Plan:

## 2010-05-14 NOTE — Assessment & Plan Note (Signed)
Encouraged cessation  Pt not ready to quit

## 2010-05-14 NOTE — Assessment & Plan Note (Signed)
Pt currenlty on lithium for biplar type features per report. Pt unclear of previous lithium level check. Does have psych follow up in 3-4 months per pt. Will check lithium level.

## 2010-05-14 NOTE — Assessment & Plan Note (Signed)
Currently stable. Will check Cr and K. Would consider changing metoprolol to propranolol in the future in setting of extensive ETOH abuse and  Hx/o peptic ulcers as to decrease risk/sequelae of esophageal varices.

## 2010-05-14 NOTE — Assessment & Plan Note (Signed)
Will recheck FLP and CMET in setting of ETOH abuse and hx/o elevated transaminases. Currently on lovaza.

## 2010-05-19 ENCOUNTER — Ambulatory Visit (HOSPITAL_COMMUNITY)
Admission: RE | Admit: 2010-05-19 | Discharge: 2010-05-19 | Disposition: A | Discharge status: Home / Self Care | Payer: BC Managed Care – PPO | Source: Ambulatory Visit | Attending: Family Medicine | Admitting: Family Medicine

## 2010-05-19 DIAGNOSIS — K7689 Other specified diseases of liver: Secondary | ICD-10-CM | POA: Insufficient documentation

## 2010-05-19 DIAGNOSIS — F1910 Other psychoactive substance abuse, uncomplicated: Secondary | ICD-10-CM | POA: Insufficient documentation

## 2010-05-19 DIAGNOSIS — I10 Essential (primary) hypertension: Secondary | ICD-10-CM | POA: Insufficient documentation

## 2010-05-19 DIAGNOSIS — R160 Hepatomegaly, not elsewhere classified: Secondary | ICD-10-CM | POA: Insufficient documentation

## 2010-06-08 LAB — GLUCOSE, CAPILLARY
Glucose-Capillary: 128 mg/dL — ABNORMAL HIGH (ref 70–99)
Glucose-Capillary: 156 mg/dL — ABNORMAL HIGH (ref 70–99)
Glucose-Capillary: 156 mg/dL — ABNORMAL HIGH (ref 70–99)
Glucose-Capillary: 160 mg/dL — ABNORMAL HIGH (ref 70–99)
Glucose-Capillary: 214 mg/dL — ABNORMAL HIGH (ref 70–99)
Glucose-Capillary: 233 mg/dL — ABNORMAL HIGH (ref 70–99)
Glucose-Capillary: 219 mg/dL — ABNORMAL HIGH (ref 70–99)

## 2010-06-08 LAB — POCT I-STAT 3, ART BLOOD GAS (G3+)
Bicarbonate: 22.7 mEq/L (ref 20.0–24.0)
O2 Saturation: 98 %
Patient temperature: 98.1
TCO2: 24 mmol/L (ref 0–100)
pCO2 arterial: 28.7 mmHg — ABNORMAL LOW (ref 35.0–45.0)
pH, Arterial: 7.401 (ref 7.350–7.450)
pH, Arterial: 7.462 — ABNORMAL HIGH (ref 7.350–7.450)
pO2, Arterial: 85 mmHg (ref 80.0–100.0)

## 2010-06-08 LAB — BASIC METABOLIC PANEL
BUN: 11 mg/dL (ref 6–23)
CO2: 22 mEq/L (ref 19–32)
Calcium: 9 mg/dL (ref 8.4–10.5)
Calcium: 9.1 mg/dL (ref 8.4–10.5)
Chloride: 101 mEq/L (ref 96–112)
Chloride: 103 mEq/L (ref 96–112)
GFR calc Af Amer: >60 mL/min (ref >60)
GFR calc Af Amer: >60 mL/min (ref >60)
GFR calc non Af Amer: >60 mL/min (ref >60)
Glucose, Bld: 220 mg/dL — ABNORMAL HIGH (ref 70–99)
Potassium: 4.3 mEq/L (ref 3.5–5.1)
Sodium: 132 mEq/L — ABNORMAL LOW (ref 135–145)
Sodium: 135 mEq/L (ref 135–145)

## 2010-06-08 LAB — CBC
Hemoglobin: 14.9 g/dL (ref 13.0–17.0)
MCHC: 35.4 g/dL (ref 30.0–36.0)
Platelets: 196 10*3/uL (ref 150–400)
RBC: 3.9 MIL/uL — ABNORMAL LOW (ref 4.22–5.81)
RDW: 14.8 % (ref 11.5–15.5)
WBC: 8.7 10*3/uL (ref 4.0–10.5)

## 2010-06-08 LAB — TRIGLYCERIDES: Triglycerides: 110 mg/dL (ref <150)

## 2010-06-09 ENCOUNTER — Encounter: Payer: Self-pay | Admitting: Family Medicine

## 2010-06-09 ENCOUNTER — Ambulatory Visit (INDEPENDENT_AMBULATORY_CARE_PROVIDER_SITE_OTHER): Payer: BC Managed Care – PPO | Admitting: Family Medicine

## 2010-06-09 VITALS — BP 139/83 | HR 76 | Temp 98.0°F | Ht 72.0 in | Wt 236.0 lb

## 2010-06-09 DIAGNOSIS — F1010 Alcohol abuse, uncomplicated: Secondary | ICD-10-CM

## 2010-06-09 DIAGNOSIS — F319 Bipolar disorder, unspecified: Secondary | ICD-10-CM

## 2010-06-09 DIAGNOSIS — I10 Essential (primary) hypertension: Secondary | ICD-10-CM

## 2010-06-09 DIAGNOSIS — M1A00X0 Idiopathic chronic gout, unspecified site, without tophus (tophi): Secondary | ICD-10-CM

## 2010-06-09 DIAGNOSIS — K760 Fatty (change of) liver, not elsewhere classified: Secondary | ICD-10-CM

## 2010-06-09 DIAGNOSIS — K7689 Other specified diseases of liver: Secondary | ICD-10-CM

## 2010-06-09 DIAGNOSIS — M1A9XX0 Chronic gout, unspecified, without tophus (tophi): Secondary | ICD-10-CM

## 2010-06-09 HISTORY — DX: Fatty (change of) liver, not elsewhere classified: K76.0

## 2010-06-09 NOTE — Assessment & Plan Note (Signed)
Stable on current regimen. Will continue to follow.  

## 2010-06-09 NOTE — Progress Notes (Signed)
  Subjective:    Patient ID: [name] [name], male    DOB: 1961-02-02, 50 y.o.   MRN: [id number]  HPI Pt here for follow up on RUQ U/S and ETOH intake. Still drinking 1/5th liquor every weekend as well as >12 ozs beer/day. No confusion, worsening LE edema.   Gout- Stable. No recent flares per pt. Compliant with allopurinol per pt.   HTN- Not checking BPs at home. Denies HA, CP, SOB, vision chages.   Review of Systems SEE HPI    Objective:   Physical Exam Gen- up in bed, NAD CV: RRR, no murmurs auscultated  PULM: CTAB, no wheezes, rales, rhoncii  ABD: mildly obese, soft, +hepatomegaly w/ liver edge measuring 4-5 cm below costal margin, no visible caput medusa  EXT: 2+ peripheral pulses, no edema     Assessment & Plan:  ETOH abuse- Pt does desire to decrease ETOH intake. Discussed RUQ U/S findings as well as overall labs in terms of alcoholic abuse disease course. Pt agreeable to cutting out 1/5th alcohol intake.  RUQ U/S 05/12/10-IMPRESSION:  Mild fatty infiltration of the liver (hepatic steatosis)   Gout- Stable. Considered addition of losartan. However, pt with significant ACEi-Angioedema reaction in the past. Will therefore hold on use. Uric Acid level <7, which is at goal.    HTN- Stable on current regimen. Will continue to follow.

## 2010-06-09 NOTE — Assessment & Plan Note (Signed)
Stable. Considered addition of losartan. However, pt with significant ACEi-Angioedema reaction in the past. Will therefore hold on use. Uric Acid level <7, which is at goal.

## 2010-06-09 NOTE — Assessment & Plan Note (Signed)
Pt does desire to decrease ETOH intake. Discussed RUQ U/S findings as well as overall labs in terms of alcoholic abuse disease course. Pt agreeable to cutting out 1/5th alcohol intake. Will continue with beer intake. Also broached community options such as AA. Pt contemplating. RUQ U/S 05/12/10-IMPRESSION:  Mild fatty infiltration of the liver (hepatic steatosis)

## 2010-06-09 NOTE — Patient Instructions (Signed)
Alcohol and Drug Use   When Is It Abuse? When Is It Addiction?  When alcohol and/or drug use interferes with normal living activities, it is called alcohol and/or drug abuse. Problems can be with family and friends, one's job, health issues, legal issues, financial and emotional problems. Mood swings are not uncommon. Depression may even develop and persist.  When abuse continues even with bad consequences, the substance abuse may have progressed to addiction. Addiction to alcohol is usually referred to as alcoholism. Addiction to other substances is often referred to as drug addiction. Chemical dependency also describes the condition where a person cannot control their alcohol and/or drug use.  CAUSES    The cause of alcoholism and drug addiction (chemical dependency) is unknown.    Research has found that having an alcoholic family member makes it more likely to have another family member become an alcoholic as well.    A person's risk for developing alcoholism or drug addiction can increase based on the person's environment. This includes:    Where and how he or she lives.   Family, friends, and culture.   Peer pressure.   How easy it is to get alcohol and drugs.    SYMPTOMS   A strong need or instinct to drink and/or use drugs (cravings).    Not able to limit drinking and/or drug-taking on any given occasion (loss of control).    Withdrawal symptoms such as nausea, sweating, shakiness and anxiety occur when alcohol and/or drug use is stopped after a period of heavy drinking and/or using (physical dependence).    The need to drink greater amounts of alcohol or take greater amounts of drugs in order to "get high" (tolerance).   DIAGNOSIS  The diagnosis is best made by a physician who specializes in addiction medicine or a licensed drug and alcohol specialist. However, it is possible to get an idea if you have a problem by answering the questions below.   Have you been told by friends or family that  drugs/alcohol has become a problem?    Do you get into fights when drinking or using drugs?    Do you not remember what you do while using (blackouts)?    Do you lie about use or amounts of drugs or alcohol you consume?    Do you need chemicals to get you going?    Do you suffer in work performance or school because of drug or alcohol use?    Do you get sick from use of drugs or alcohol, but keep using anyway?    Do you need drugs or alcohol to relate to people or feel comfortable in social situations?    Do you use drugs or alcohol to forget problems?   If you answered yes to any of the above questions, you show signs of chemical dependency. Professional evaluation is suggested. The longer you use drugs and alcohol, the greater the problems will become.  TREATMENT  Alcoholism/addiction has little to do with willpower. Alcoholics and addicts have an uncontrollable need for alcohol and/or drugs that overrides their ability to stop drinking or using. This need can be as strong as the need for food or water. Although some people are able to recover from alcoholism/addiction without help, most alcoholics and addicts need help. With treatment and support, many people are able to stop drinking and using drugs. Long term recovery is possible.   Addiction cannot be cured but it can be treated successfully. Most hospitals and clinics 

## 2010-06-10 LAB — LITHIUM LEVEL: Lithium Lvl: 0.96 mEq/L (ref 0.80–1.40)

## 2010-06-11 ENCOUNTER — Encounter: Payer: Self-pay | Admitting: Family Medicine

## 2010-07-11 NOTE — Op Note (Signed)
[name] [name], [name] [name] NO.:  [id number]   MEDICAL RECORD NO.:  [id number]          PATIENT TYPE:  OBV   LOCATION:  2550                         FACILITY:  [facility]   PHYSICIAN:  Antony Contras, MD     DATE OF BIRTH:  1960/11/06   DATE OF PROCEDURE:  05/10/2008  DATE OF DISCHARGE:                               OPERATIVE REPORT   PREOPERATIVE DIAGNOSIS:  Angioedema.   POSTOPERATIVE DIAGNOSIS:  Angioedema.   PROCEDURE:  Flexible bronchoscopy with orotracheal intubation.   SURGEON:  Excell Seltzer. Jenne Pane, MD   ANESTHESIA:  Topical.   COMPLICATIONS:  None.   INDICATIONS:  The patient is a 50 year old white male who has developed  severe tongue swelling through the day today and presented to the  emergency department by EMS and had increasing difficulty breathing in  the emergency department.  He is brought to the operating room for  airway management.   FINDINGS:  The tongue was quite large and swollen in the mouth.  The  tongue base was also edematous as was the tip of the epiglottis.  The  endolaryngeal larynx appeared normal as did the pharynx.  The nasal  passages were both obstructed by septal spurs and turbinates, and  nasotracheal intubation could not be performed.   DESCRIPTION OF PROCEDURE:  The patient was identified in the emergency  department and was moved urgently to the operating room.  The patient  did not have family present and could not communicate verbally and so  informed consent was not obtained, but the case would  be performed on  an emergency basis.  The patient was moved on the operative room table  and initially managed by the anesthesia team.  Many attempts were made  at passing nasal trumpets through the nasal passages but were not  successful due to fixed obstructions.  This was in spite of Afrin and  topical lidocaine jelly used.  Topical anesthetic was applied within the  mouth using cotton pledgets by the anesthesia team as well and  was  placed transtracheally through the skin as well.  The endotracheal tube  was then passed through the oral cavity into the oropharynx, and a  bronchoscope was passed through the endotracheal tube and down through  the larynx into the trachea.  The endotracheal tube was then advanced  over the bronchoscope and into the airway.  The bronchoscope was then  backed up, and the balloon on the tube was inflated and the tube  attached to the anesthesia circuit.  The patient was successfully  ventilated.  The tube was then secured  to the cheek.  This was done after position was confirmed by Anesthesia  auscultating the chest.  During this process, the patient was given an  anesthetic to give him sedation, and he was then moved to the recovery  room in stable condition.      Antony Contras, MD  Electronically Signed     DDB/MEDQ  D:  05/10/2008  T:  05/11/2008  Job:  [id number]

## 2010-07-11 NOTE — Discharge Summary (Signed)
[name], [name] NO.:  [id number]   MEDICAL RECORD NO.:  [id number]          PATIENT TYPE:  INP   LOCATION:  6736                         FACILITY:  [facility]   PHYSICIAN:  Antony Contras, MD     DATE OF BIRTH:  07-Mar-1960   DATE OF ADMISSION:  05/10/2008  DATE OF DISCHARGE:  05/13/2008                               DISCHARGE SUMMARY   ADMISSION DIAGNOSIS:  1. Angioedema.  2. Upper airway obstruction.  3. Diabetes.  4. Hypertension.   DISCHARGE DIAGNOSES:  1. Angioedema.  2. Upper airway obstruction.  3. Diabetes.  4. Hypertension.   HISTORY OF PRESENT ILLNESS:  The patient is a 50 year old male with  multiple medical problems who presented to emergency department on May 10, 2008, because of severe tongue swelling that began earlier during  that day.  He developed more difficulty breathing in the emergency  department and was evaluated emergently.  He was found to have severe  swelling of the tongue and portions of the larynx.  He takes an ACE  inhibitor and this was felt to be the cause of his angioedema.  He was  taken to the operating room for emergent intubation.   HOSPITAL COURSE:  The patient was taken to the operating room for  emergency intubation.  For details of the procedure, please see dictated  operative note.  Afterwards, the patient was admitted to the Intensive  Care Unit and treated with steroids, H1 and H2 blockers, and mechanical  ventilation.  On the following day, he was found to still have  significant edema of the tongue and was kept intubated for an additional  day.  On the following day, his tongue edema had resolved completely.  He was thus extubated successfully and had no additional breathing  problems.  He was transferred to a regular room and observed overnight,  1 additional night.  On the following day, he was felt stable for  discharge home and was discharged.   DISCHARGE INSTRUCTIONS:  The patient was told to never  take ACE  inhibitors again.  He is to resume his other medications.  Diet is  normal.   Followup:  The patient should follow with his primary care physician for  management of his antihypertensives.  He does not need followup with  ENT.      Antony Contras, MD  Electronically Signed    DDB/MEDQ  D:  07/01/2008  T:  07/02/2008  Job:  [id number]

## 2010-07-11 NOTE — Op Note (Signed)
[name] [name], BORSUK NO.:  [id number]   MEDICAL RECORD NO.:  [id number]          PATIENT TYPE:  OBV   LOCATION:  2550                         FACILITY:  [facility]   PHYSICIAN:  Antony Contras, MD     DATE OF BIRTH:  Jul 30, 1960   DATE OF PROCEDURE:  DATE OF DISCHARGE:                               OPERATIVE REPORT   PREOPERATIVE DIAGNOSIS:  Angioedema.   POSTOPERATIVE DIAGNOSIS:  Angioedema.   ANESTHESIA:  Transnasal fiberoptic laryngoscopy.   SURGEON:  Excell Seltzer. Jenne Pane, MD   ANESTHESIA:  None.   COMPLICATIONS:  None.   INDICATIONS:  The patient is a 50 year old white male with a less than 1-  day history of increasing swelling of the tongue to the point of  difficulty breathing.  The procedure is being performed to evaluate the  oropharyngeal and laryngeal airway.   FINDINGS:  Upon passing the laryngoscope passed the nasopharynx, there  was edema noted of the vallecular tongue base pushing the epiglottis  posteriorly.  The tip of the epiglottis was floppy and redundant with  mild edema.  The endolarynx was normal with evidence of supraglottic,  glottic, or subglottic edema and vocal folds were mobile.   DESCRIPTION OF PROCEDURE:  The patient was identified in the emergency  department and due to the urgency of the situation, informed consent was  not obtained.  The flexible laryngoscope was passed through the right  nasal passage and used to evaluate the nasal passages, nasopharynx,  oropharynx, and pharynx as well as hypopharynx.  Findings as noted  above.  The laryngoscope was then removed, and the patient tolerated  procedure well.   PLAN:  Because of increasing edema of the tongue and the relatively  normal-appearing endolarynx and hypopharynx, the decision was made to  proceed with a fiberoptic intubation.  This will be performed in the  operating room setting.  Informed consent was not obtained due to the  emergency nature of the situation, and a  lack of the family present.  The patient will be moved immediately to the operating room for  management.  The patient will then be admitted to hospital in an ICU  setting for management of his angioedema.  Hopefully, the angioedema  will resolve within 24-48 hours and the endotracheal tube can be  removed.      Antony Contras, MD  Electronically Signed    DDB/MEDQ  D:  05/10/2008  T:  05/11/2008  Job:  [id number]

## 2010-07-11 NOTE — Consult Note (Signed)
NAMELEE, KUANG NO.:  [id number]   MEDICAL RECORD NO.:  [id number]          PATIENT TYPE:  OBV   LOCATION:  2550                         FACILITY:  [facility]   PHYSICIAN:  Antony Contras, MD     DATE OF BIRTH:  09/15/1960   DATE OF CONSULTATION:  DATE OF DISCHARGE:                                 CONSULTATION   REQUESTING SERVICE:  Emergency Department.   CHIEF COMPLAINT:  Tongue swelling.   HISTORY OF PRESENT ILLNESS:  The patient is a 50 year old white male  with multiple medical problems who presented to the emergency department  because of severe tongue swelling that began earlier today.  He denies  mouth pain.  In the emergency department, he developed more difficulty  breathing.  Further history is not able to be obtained due to the fact  that he was brought in by EMS without family and that he could not  communicate verbally.   PAST MEDICAL HISTORY:  Unknown.   PAST SURGICAL HISTORY:  Unknown.   MEDICATIONS:  Amlodipine, an ACE inhibitor, allopurinol, colchicine,  metoprolol, Niaspan, TriCor, baby aspirin, fluoxetine, Omega-3 fatty  acid.   ALLERGIES:  Unknown.   FAMILY HISTORY:  Unknown.   SOCIAL HISTORY:  Unknown.   REVIEW OF SYSTEMS:  Unknown.   PHYSICAL EXAMINATION:  VITAL SIGNS:  Blood pressure 160/96, pulse 86,  respirations 18, sat 100%.  GENERAL:  The patient is sitting upright with his mouth open and not  moving otherwise.  He appears anxious but is not having any stridor at  present.  ORAL CAVITY/OROPHARYNX:  The oral cavity is open with a severely  edematous tongue filling the oral cavity.  NOSE:  External nose is normal.  Nasal passages are patent anteriorly,  but there are spurs of the septum on both sides.  EARS:  External ears are normal.  EYES:  Extraocular movements were intact.  Pupils were equal, round, and  reactive to light.  NECK:  No tenderness or deformity.   ASSESSMENT:  The patient is a 50 year old white male  with severe  angioedema likely from ACE inhibitor use.   PLAN:  The examination was truncated due to the emergency of the  situation.  Because of increasing breathing difficulty and severe tongue  swelling, the decision was made to proceed with fiberoptic examination  of the larynx.  There was no one present to give consent and the patient  was not able to communicate verbally, so the procedure was performed on  an emergency basis.   PLAN:  The further plan will be according to that examination, it will  include admission to the hospital for airway management.      Antony Contras, MD  Electronically Signed     DDB/MEDQ  D:  05/10/2008  T:  05/11/2008  Job:  [id number]

## 2010-07-12 ENCOUNTER — Encounter: Payer: Self-pay | Admitting: Family Medicine

## 2010-07-12 ENCOUNTER — Ambulatory Visit (INDEPENDENT_AMBULATORY_CARE_PROVIDER_SITE_OTHER): Payer: BC Managed Care – PPO | Admitting: Family Medicine

## 2010-07-12 DIAGNOSIS — F1010 Alcohol abuse, uncomplicated: Secondary | ICD-10-CM

## 2010-07-12 DIAGNOSIS — F339 Major depressive disorder, recurrent, unspecified: Secondary | ICD-10-CM

## 2010-07-12 DIAGNOSIS — F319 Bipolar disorder, unspecified: Secondary | ICD-10-CM

## 2010-07-12 DIAGNOSIS — I10 Essential (primary) hypertension: Secondary | ICD-10-CM

## 2010-07-12 DIAGNOSIS — D649 Anemia, unspecified: Secondary | ICD-10-CM

## 2010-07-12 LAB — COMPREHENSIVE METABOLIC PANEL
ALT: 31 U/L (ref 0–53)
AST: 31 U/L (ref 0–37)
Alkaline Phosphatase: 98 U/L (ref 39–117)
CO2: 18 mEq/L — ABNORMAL LOW (ref 19–32)
Sodium: 132 mEq/L — ABNORMAL LOW (ref 135–145)
Total Bilirubin: 0.5 mg/dL (ref 0.3–1.2)
Total Protein: 7.5 g/dL (ref 6.0–8.3)

## 2010-07-12 LAB — CBC
MCH: 35 pg — ABNORMAL HIGH (ref 26.0–34.0)
MCHC: 34.7 g/dL (ref 30.0–36.0)
MCV: 100.8 fL — ABNORMAL HIGH (ref 78.0–100.0)
Platelets: 355 10*3/uL (ref 150–400)
RDW: 13.3 % (ref 11.5–15.5)

## 2010-07-12 MED ORDER — OMEGA-3-ACID ETHYL ESTERS 1 G PO CAPS: 2.0000 g | ORAL_CAPSULE | Freq: Two times a day (BID) | ORAL | Status: DC

## 2010-07-12 MED ORDER — SPIRONOLACTONE 50 MG PO TABS: 50.0000 mg | ORAL_TABLET | Freq: Every day | ORAL | Status: DC

## 2010-07-12 NOTE — Patient Instructions (Signed)
It was good to see you again Congratulations on cutting back drinking!! I am checking some labs today Make sure to check your blood pressures at least once daily Come back to see me in 1 month Otherwise with any questions, God Bless Doree Albee MD

## 2010-07-12 NOTE — Assessment & Plan Note (Signed)
intake-improving. Interval decrease in ETOH intake; still with high overall intake. Will continue to follow; overall trend is reassuring. Continue with multivitamin supplementation. Would anticipate long term taper if pt is willing to quit alcohol intake. Will check cbc and CMET.

## 2010-07-12 NOTE — Assessment & Plan Note (Signed)
Currently stable. Continue with current regimen.

## 2010-07-12 NOTE — Progress Notes (Signed)
  Subjective:    Patient ID: [name] [name], male    DOB: 1960-06-16, 50 y.o.   MRN: [id number]  HPI ETOH intake--Pt now drinking 1 pint of liquor instead of 1/5th liquor weekly;  also drinking up to 1 six pack of beer per day. No confusion, headache.  HTN- Not checking blood pressures at home. No HA, CP, SOB, blurred vision.  Bipolar d/o-Taking Lithium 450 CR daily and prozac 40mg . No HI/SI. Mood overall stable.   Review of Systems See HPI     Objective:   Physical Exam Gen- up in bed, NAD  CV: RRR, no murmurs auscultated  PULM: CTAB, no wheezes, rales, rhoncii  ABD: mildly obese, soft, +hepatomegaly w/ liver edge measuring 4-5 cm below costal margin, no visible caput medusa  EXT: 2+ peripheral pulses, no edema     Assessment & Plan:  ETOH intake-improving. Interval decrease in ETOH intake; still with high overall intake. Will continue to follow; overall trend is reassuring. Continue with multivitamin supplementation. Would anticipate long term taper if pt is willing to quit alcohol intake. Will check cbc and CMET.  HTN- Currently stable. Continue with current regimen.  Psych- Mood stable. Will check lithium level. Increased lithium level testing in setting of alcohol abuse.

## 2010-07-12 NOTE — Assessment & Plan Note (Signed)
Mood stable. Will check lithium level. Increased lithium level testing in setting of alcohol abuse.   

## 2010-07-12 NOTE — Assessment & Plan Note (Signed)
Mood stable. Will check lithium level. Increased lithium level testing in setting of alcohol abuse.

## 2010-07-13 ENCOUNTER — Telehealth: Payer: Self-pay | Admitting: Family Medicine

## 2010-07-13 NOTE — Telephone Encounter (Signed)
Called in Rx to pharmacy and notified patient.Busick, Rodena Medin

## 2010-07-13 NOTE — Telephone Encounter (Signed)
Pt was just here yesterday, 2 meds were refilled but pts wife says she has been trying to get Spironolactone 50 mg for over a week, goes to [facility].

## 2010-08-09 ENCOUNTER — Ambulatory Visit: Payer: BC Managed Care – PPO | Admitting: Family Medicine

## 2010-08-10 ENCOUNTER — Emergency Department (HOSPITAL_COMMUNITY): Payer: BC Managed Care – PPO

## 2010-08-10 ENCOUNTER — Encounter (HOSPITAL_COMMUNITY): Payer: Self-pay | Admitting: Radiology

## 2010-08-10 ENCOUNTER — Inpatient Hospital Stay (HOSPITAL_COMMUNITY)
Admission: EM | Admit: 2010-08-10 | Discharge: 2010-08-15 | DRG: 552 | Disposition: A | Discharge status: Home Health | Payer: BC Managed Care – PPO | Source: Ambulatory Visit | Attending: Internal Medicine | Admitting: Internal Medicine

## 2010-08-10 DIAGNOSIS — K254 Chronic or unspecified gastric ulcer with hemorrhage: Principal | ICD-10-CM | POA: Diagnosis present

## 2010-08-10 DIAGNOSIS — F1210 Cannabis abuse, uncomplicated: Secondary | ICD-10-CM | POA: Diagnosis present

## 2010-08-10 DIAGNOSIS — E119 Type 2 diabetes mellitus without complications: Secondary | ICD-10-CM | POA: Diagnosis present

## 2010-08-10 DIAGNOSIS — D62 Acute posthemorrhagic anemia: Secondary | ICD-10-CM | POA: Diagnosis present

## 2010-08-10 DIAGNOSIS — F329 Major depressive disorder, single episode, unspecified: Secondary | ICD-10-CM | POA: Diagnosis present

## 2010-08-10 DIAGNOSIS — E875 Hyperkalemia: Secondary | ICD-10-CM | POA: Diagnosis present

## 2010-08-10 DIAGNOSIS — N179 Acute kidney failure, unspecified: Secondary | ICD-10-CM | POA: Diagnosis present

## 2010-08-10 DIAGNOSIS — F1010 Alcohol abuse, uncomplicated: Secondary | ICD-10-CM | POA: Diagnosis present

## 2010-08-10 DIAGNOSIS — F3289 Other specified depressive episodes: Secondary | ICD-10-CM | POA: Diagnosis present

## 2010-08-10 DIAGNOSIS — I6322 Cerebral infarction due to unspecified occlusion or stenosis of basilar arteries: Secondary | ICD-10-CM

## 2010-08-10 DIAGNOSIS — J690 Pneumonitis due to inhalation of food and vomit: Secondary | ICD-10-CM | POA: Diagnosis present

## 2010-08-10 DIAGNOSIS — D689 Coagulation defect, unspecified: Secondary | ICD-10-CM | POA: Diagnosis present

## 2010-08-10 DIAGNOSIS — I10 Essential (primary) hypertension: Secondary | ICD-10-CM | POA: Diagnosis present

## 2010-08-10 DIAGNOSIS — Z7982 Long term (current) use of aspirin: Secondary | ICD-10-CM

## 2010-08-10 DIAGNOSIS — R269 Unspecified abnormalities of gait and mobility: Secondary | ICD-10-CM | POA: Diagnosis present

## 2010-08-10 DIAGNOSIS — Z781 Physical restraint status: Secondary | ICD-10-CM | POA: Diagnosis not present

## 2010-08-10 DIAGNOSIS — E785 Hyperlipidemia, unspecified: Secondary | ICD-10-CM | POA: Diagnosis present

## 2010-08-10 DIAGNOSIS — J9600 Acute respiratory failure, unspecified whether with hypoxia or hypercapnia: Secondary | ICD-10-CM

## 2010-08-10 DIAGNOSIS — F17200 Nicotine dependence, unspecified, uncomplicated: Secondary | ICD-10-CM | POA: Diagnosis present

## 2010-08-10 DIAGNOSIS — A419 Sepsis, unspecified organism: Secondary | ICD-10-CM | POA: Diagnosis present

## 2010-08-10 DIAGNOSIS — Z87828 Personal history of other (healed) physical injury and trauma: Secondary | ICD-10-CM

## 2010-08-10 DIAGNOSIS — G9389 Other specified disorders of brain: Secondary | ICD-10-CM

## 2010-08-10 DIAGNOSIS — M109 Gout, unspecified: Secondary | ICD-10-CM | POA: Diagnosis present

## 2010-08-10 DIAGNOSIS — E871 Hypo-osmolality and hyponatremia: Secondary | ICD-10-CM | POA: Diagnosis present

## 2010-08-10 DIAGNOSIS — Z8711 Personal history of peptic ulcer disease: Secondary | ICD-10-CM

## 2010-08-10 DIAGNOSIS — K209 Esophagitis, unspecified without bleeding: Secondary | ICD-10-CM | POA: Diagnosis present

## 2010-08-10 DIAGNOSIS — Z8673 Personal history of transient ischemic attack (TIA), and cerebral infarction without residual deficits: Secondary | ICD-10-CM

## 2010-08-10 HISTORY — DX: Essential (primary) hypertension: I10

## 2010-08-10 LAB — CARDIAC PANEL(CRET KIN+CKTOT+MB+TROPI)
CK, MB: 2 ng/mL (ref 0.3–4.0)
Relative Index: 0.3 (ref 0.0–2.5)
Total CK: 597 U/L — ABNORMAL HIGH (ref 7–232)
Troponin I: <0.3 ng/mL (ref <0.30)

## 2010-08-10 LAB — CBC
HCT: 19.8 % — ABNORMAL LOW (ref 39.0–52.0)
Hemoglobin: 6.6 g/dL — CL (ref 13.0–17.0)
Hemoglobin: 6.7 g/dL — CL (ref 13.0–17.0)
MCH: 33.2 pg (ref 26.0–34.0)
MCHC: 36 g/dL (ref 30.0–36.0)
MCV: 101.5 fL — ABNORMAL HIGH (ref 78.0–100.0)
MCV: 92.1 fL (ref 78.0–100.0)
RBC: 1.95 MIL/uL — ABNORMAL LOW (ref 4.22–5.81)
RBC: 2.02 MIL/uL — ABNORMAL LOW (ref 4.22–5.81)
WBC: 41.1 10*3/uL — ABNORMAL HIGH (ref 4.0–10.5)

## 2010-08-10 LAB — URINALYSIS, ROUTINE W REFLEX MICROSCOPIC
Bilirubin Urine: NEGATIVE
Nitrite: NEGATIVE
Specific Gravity, Urine: 1.015 (ref 1.005–1.030)
Urobilinogen, UA: 0.2 mg/dL (ref 0.0–1.0)

## 2010-08-10 LAB — COMPREHENSIVE METABOLIC PANEL
Albumin: 1.9 g/dL — ABNORMAL LOW (ref 3.5–5.2)
Alkaline Phosphatase: 77 U/L (ref 39–117)
BUN: 56 mg/dL — ABNORMAL HIGH (ref 6–23)
Potassium: 5.5 mEq/L — ABNORMAL HIGH (ref 3.5–5.1)
Sodium: 125 mEq/L — ABNORMAL LOW (ref 135–145)
Total Protein: 6 g/dL (ref 6.0–8.3)

## 2010-08-10 LAB — BASIC METABOLIC PANEL
BUN: 63 mg/dL — ABNORMAL HIGH (ref 6–23)
CO2: 16 mEq/L — ABNORMAL LOW (ref 19–32)
Calcium: 7.4 mg/dL — ABNORMAL LOW (ref 8.4–10.5)
GFR calc non Af Amer: 36 mL/min — ABNORMAL LOW (ref >60)
Glucose, Bld: 206 mg/dL — ABNORMAL HIGH (ref 70–99)
Sodium: 127 mEq/L — ABNORMAL LOW (ref 135–145)

## 2010-08-10 LAB — DIFFERENTIAL
Basophils Absolute: 0.4 10*3/uL — ABNORMAL HIGH (ref 0.0–0.1)
Basophils Relative: 1 % (ref 0–1)
Eosinophils Absolute: 0 10*3/uL (ref 0.0–0.7)
Lymphocytes Relative: 16 % (ref 12–46)
Monocytes Absolute: 2.9 10*3/uL — ABNORMAL HIGH (ref 0.1–1.0)
Neutrophils Relative %: 76 % (ref 43–77)

## 2010-08-10 LAB — POCT I-STAT 3, ART BLOOD GAS (G3+)
Bicarbonate: 17.9 mEq/L — ABNORMAL LOW (ref 20.0–24.0)
Bicarbonate: 16 mEq/L — ABNORMAL LOW (ref 20.0–24.0)
O2 Saturation: 100 %
O2 Saturation: 99 %
TCO2: 20 mmol/L (ref 0–100)
TCO2: 17 mmol/L (ref 0–100)
pCO2 arterial: 36.2 mmHg (ref 35.0–45.0)

## 2010-08-10 LAB — RAPID URINE DRUG SCREEN, HOSP PERFORMED
Barbiturates: NOT DETECTED
Cocaine: NOT DETECTED

## 2010-08-10 LAB — MRSA PCR SCREENING: MRSA by PCR: NEGATIVE

## 2010-08-10 LAB — TROPONIN I: Troponin I: <0.3 ng/mL (ref <0.30)

## 2010-08-10 LAB — CK TOTAL AND CKMB (NOT AT ARMC)
CK, MB: 0.7 ng/mL (ref 0.3–4.0)
Relative Index: INVALID (ref 0.0–2.5)

## 2010-08-10 LAB — LACTIC ACID, PLASMA
Lactic Acid, Venous: 15.2 mmol/L — ABNORMAL HIGH (ref 0.5–2.2)
Lactic Acid, Venous: 2.1 mmol/L (ref 0.5–2.2)

## 2010-08-10 LAB — APTT: aPTT: 61 seconds — ABNORMAL HIGH (ref 24–37)

## 2010-08-10 LAB — ETHANOL: Alcohol, Ethyl (B): <11 mg/dL — ABNORMAL HIGH (ref 0–10)

## 2010-08-11 ENCOUNTER — Inpatient Hospital Stay (HOSPITAL_COMMUNITY): Payer: BC Managed Care – PPO

## 2010-08-11 DIAGNOSIS — I517 Cardiomegaly: Secondary | ICD-10-CM

## 2010-08-11 LAB — CARDIAC PANEL(CRET KIN+CKTOT+MB+TROPI)
CK, MB: 2.5 ng/mL (ref 0.3–4.0)
Relative Index: 0.3 (ref 0.0–2.5)
Total CK: 1069 U/L — ABNORMAL HIGH (ref 7–232)
Total CK: 917 U/L — ABNORMAL HIGH (ref 7–232)
Troponin I: <0.3 ng/mL (ref <0.30)

## 2010-08-11 LAB — BLOOD GAS, ARTERIAL
Bicarbonate: 14.4 mEq/L — ABNORMAL LOW (ref 20.0–24.0)
MECHVT: 640 mL
O2 Saturation: 99.5 %
PEEP: 5 cmH2O
Patient temperature: 98.6
TCO2: 15 mmol/L (ref 0–100)
pH, Arterial: 7.453 — ABNORMAL HIGH (ref 7.350–7.450)

## 2010-08-11 LAB — BASIC METABOLIC PANEL
CO2: 17 mEq/L — ABNORMAL LOW (ref 19–32)
CO2: 17 mEq/L — ABNORMAL LOW (ref 19–32)
CO2: 18 mEq/L — ABNORMAL LOW (ref 19–32)
Calcium: 7.4 mg/dL — ABNORMAL LOW (ref 8.4–10.5)
Calcium: 7.6 mg/dL — ABNORMAL LOW (ref 8.4–10.5)
Calcium: 7.7 mg/dL — ABNORMAL LOW (ref 8.4–10.5)
Chloride: 102 mEq/L (ref 96–112)
Creatinine, Ser: 1.92 mg/dL — ABNORMAL HIGH (ref 0.50–1.35)
Creatinine, Ser: 1.84 mg/dL — ABNORMAL HIGH (ref 0.50–1.35)
Creatinine, Ser: 1.48 mg/dL — ABNORMAL HIGH (ref 0.50–1.35)
GFR calc Af Amer: 46 mL/min — ABNORMAL LOW (ref >60)
GFR calc Af Amer: 45 mL/min — ABNORMAL LOW (ref >60)
GFR calc non Af Amer: 38 mL/min — ABNORMAL LOW (ref >60)
GFR calc non Af Amer: 37 mL/min — ABNORMAL LOW (ref >60)
GFR calc non Af Amer: 39 mL/min — ABNORMAL LOW (ref >60)
Glucose, Bld: 121 mg/dL — ABNORMAL HIGH (ref 70–99)
Potassium: 4.1 mEq/L (ref 3.5–5.1)
Sodium: 130 mEq/L — ABNORMAL LOW (ref 135–145)

## 2010-08-11 LAB — PROCALCITONIN
Procalcitonin: 15.94 ng/mL
Procalcitonin: 15.74 ng/mL

## 2010-08-11 LAB — CBC
HCT: 21.2 % — ABNORMAL LOW (ref 39.0–52.0)
Hemoglobin: 7.5 g/dL — ABNORMAL LOW (ref 13.0–17.0)
Hemoglobin: 7.3 g/dL — ABNORMAL LOW (ref 13.0–17.0)
MCH: 31.6 pg (ref 26.0–34.0)
MCH: 32.3 pg (ref 26.0–34.0)
MCH: 32.2 pg (ref 26.0–34.0)
MCHC: 35.4 g/dL (ref 30.0–36.0)
MCHC: 35.8 g/dL (ref 30.0–36.0)
MCHC: 35.4 g/dL (ref 30.0–36.0)
Platelets: 251 10*3/uL (ref 150–400)
RBC: 2.14 MIL/uL — ABNORMAL LOW (ref 4.22–5.81)
RDW: 14.7 % (ref 11.5–15.5)
RDW: 15.3 % (ref 11.5–15.5)
RDW: 15.4 % (ref 11.5–15.5)

## 2010-08-11 LAB — URINE CULTURE
Colony Count: NO GROWTH
Culture  Setup Time: 201206141547
Culture: NO GROWTH

## 2010-08-11 LAB — GLUCOSE, CAPILLARY
Glucose-Capillary: 115 mg/dL — ABNORMAL HIGH (ref 70–99)
Glucose-Capillary: 137 mg/dL — ABNORMAL HIGH (ref 70–99)
Glucose-Capillary: 164 mg/dL — ABNORMAL HIGH (ref 70–99)
Glucose-Capillary: 167 mg/dL — ABNORMAL HIGH (ref 70–99)

## 2010-08-11 LAB — PATHOLOGIST SMEAR REVIEW

## 2010-08-12 ENCOUNTER — Inpatient Hospital Stay (HOSPITAL_COMMUNITY): Payer: BC Managed Care – PPO

## 2010-08-12 DIAGNOSIS — G9340 Encephalopathy, unspecified: Secondary | ICD-10-CM

## 2010-08-12 DIAGNOSIS — K922 Gastrointestinal hemorrhage, unspecified: Secondary | ICD-10-CM

## 2010-08-12 LAB — GLUCOSE, CAPILLARY
Glucose-Capillary: 125 mg/dL — ABNORMAL HIGH (ref 70–99)
Glucose-Capillary: 111 mg/dL — ABNORMAL HIGH (ref 70–99)

## 2010-08-12 LAB — CARDIAC PANEL(CRET KIN+CKTOT+MB+TROPI)
CK, MB: 1.6 ng/mL (ref 0.3–4.0)
Relative Index: 0.4 (ref 0.0–2.5)
Total CK: 431 U/L — ABNORMAL HIGH (ref 7–232)
Troponin I: <0.3 ng/mL (ref <0.30)
Troponin I: <0.3 ng/mL (ref <0.30)

## 2010-08-12 LAB — BLOOD GAS, ARTERIAL
Acid-base deficit: 7 mmol/L — ABNORMAL HIGH (ref 0.0–2.0)
Bicarbonate: 17 mEq/L — ABNORMAL LOW (ref 20.0–24.0)
O2 Saturation: 98.3 %
pCO2 arterial: 29 mmHg — ABNORMAL LOW (ref 35.0–45.0)
pO2, Arterial: 105 mmHg — ABNORMAL HIGH (ref 80.0–100.0)

## 2010-08-12 LAB — CBC
HCT: 20 % — ABNORMAL LOW (ref 39.0–52.0)
Hemoglobin: 7 g/dL — ABNORMAL LOW (ref 13.0–17.0)
MCHC: 35 g/dL (ref 30.0–36.0)
MCV: 87.3 fL (ref 78.0–100.0)
RDW: 19.7 % — ABNORMAL HIGH (ref 11.5–15.5)
WBC: 16.8 10*3/uL — ABNORMAL HIGH (ref 4.0–10.5)

## 2010-08-12 LAB — PREPARE RBC (CROSSMATCH)

## 2010-08-12 NOTE — Procedures (Unsigned)
HISTORY:  A 50 year old man after significant GI bleed and history of ethyl alcohol abuse was found unresponsive.  EEG is for further evaluation.  Currently, the patient is awaking during the EEG and intubated.  MEDICATIONS:  Levophed, Cipro, Protonix, folate, thiamine.  The sedation was turned off before the EEG.  EEG DURATION:  21 minutes of EEG recording.  DESCRIPTION:  This is a routine 18 channel adult EEG recording with 1 channel devoted to limited EKG recording.  Activation procedure is performed using the photic stimulation and the studies performed in awake and mild drowsy state. As the EEG opens up, I noticed that the posterior dominant rhythm consist of 7 Hz frequency with an amplitude of 14-22 microvolts.  The rhythm is symmetrical and continuous.  There is no asymmetry or focal abnormality noted on this EEG.  No driving was noted with the photic stimulation in posterior leads.  This EEG is reactive and some vertex waves and vaguely formed synchronous spindles are also noted.  However, no deep stages of sleep are identified during the study.  There is no electrographic seizures or epileptiform discharges recorded during this study either.  EEG INTERPRETATION:  This is a very mildly abnormal EEG due to the generalized slowing in high theta ranges that is reactive.  There is no evidence to suggest electrographic seizures or epileptiform discharges based on this study.  CLINICAL NOTE:  This degree of a very mild generalized reactive slowing suggest a very mild but nonspecific encephalopathy and can be seen in a variety of toxic metabolic infectious structural or ischemic causes. Clinical correlation is advised.          ______________________________ Levie Heritage, MD    VH:QION D:  08/11/2010 13:00:07  T:  08/12/2010 03:11:31  Job #:  [id number]

## 2010-08-13 ENCOUNTER — Other Ambulatory Visit: Payer: Self-pay | Admitting: Gastroenterology

## 2010-08-13 LAB — CULTURE, RESPIRATORY W GRAM STAIN

## 2010-08-13 LAB — BASIC METABOLIC PANEL
BUN: 13 mg/dL (ref 6–23)
CO2: 22 mEq/L (ref 19–32)
Chloride: 105 mEq/L (ref 96–112)
Creatinine, Ser: 0.93 mg/dL (ref 0.50–1.35)
GFR calc Af Amer: >60 mL/min (ref >60)
Potassium: 3.6 mEq/L (ref 3.5–5.1)

## 2010-08-13 LAB — CROSSMATCH
ABO/RH(D): O POS
Antibody Screen: NEGATIVE
Unit division: 0
Unit division: 0

## 2010-08-13 LAB — CBC
HCT: 23.6 % — ABNORMAL LOW (ref 39.0–52.0)
HCT: 24.3 % — ABNORMAL LOW (ref 39.0–52.0)
Hemoglobin: 8.4 g/dL — ABNORMAL LOW (ref 13.0–17.0)
MCV: 87.7 fL (ref 78.0–100.0)
RBC: 2.69 MIL/uL — ABNORMAL LOW (ref 4.22–5.81)
RBC: 2.74 MIL/uL — ABNORMAL LOW (ref 4.22–5.81)
RDW: 18.9 % — ABNORMAL HIGH (ref 11.5–15.5)
WBC: 12.4 10*3/uL — ABNORMAL HIGH (ref 4.0–10.5)

## 2010-08-13 LAB — DIFFERENTIAL
Basophils Absolute: 0 10*3/uL (ref 0.0–0.1)
Basophils Relative: 0 % (ref 0–1)
Lymphocytes Relative: 24 % (ref 12–46)
Monocytes Absolute: 0.8 10*3/uL (ref 0.1–1.0)
Monocytes Relative: 7 % (ref 3–12)
Neutro Abs: 7.8 10*3/uL — ABNORMAL HIGH (ref 1.7–7.7)
Neutrophils Relative %: 67 % (ref 43–77)

## 2010-08-13 LAB — GLUCOSE, CAPILLARY: Glucose-Capillary: 133 mg/dL — ABNORMAL HIGH (ref 70–99)

## 2010-08-13 LAB — PROCALCITONIN: Procalcitonin: 1.38 ng/mL

## 2010-08-14 ENCOUNTER — Inpatient Hospital Stay (HOSPITAL_COMMUNITY): Payer: BC Managed Care – PPO

## 2010-08-14 LAB — GLUCOSE, CAPILLARY
Glucose-Capillary: 105 mg/dL — ABNORMAL HIGH (ref 70–99)
Glucose-Capillary: 116 mg/dL — ABNORMAL HIGH (ref 70–99)
Glucose-Capillary: 123 mg/dL — ABNORMAL HIGH (ref 70–99)
Glucose-Capillary: 129 mg/dL — ABNORMAL HIGH (ref 70–99)

## 2010-08-14 LAB — CBC
Platelets: 277 10*3/uL (ref 150–400)
RBC: 2.62 MIL/uL — ABNORMAL LOW (ref 4.22–5.81)
RDW: 19.4 % — ABNORMAL HIGH (ref 11.5–15.5)
WBC: 12.1 10*3/uL — ABNORMAL HIGH (ref 4.0–10.5)

## 2010-08-14 LAB — URINALYSIS, ROUTINE W REFLEX MICROSCOPIC
Bilirubin Urine: NEGATIVE
Glucose, UA: NEGATIVE mg/dL
Nitrite: NEGATIVE
Specific Gravity, Urine: 1.016 (ref 1.005–1.030)
pH: 5.5 (ref 5.0–8.0)

## 2010-08-14 LAB — BASIC METABOLIC PANEL
CO2: 22 mEq/L (ref 19–32)
Chloride: 103 mEq/L (ref 96–112)
Creatinine, Ser: 1.13 mg/dL (ref 0.50–1.35)
GFR calc Af Amer: >60 mL/min (ref >60)
Potassium: 3.6 mEq/L (ref 3.5–5.1)
Sodium: 134 mEq/L — ABNORMAL LOW (ref 135–145)

## 2010-08-15 LAB — GLUCOSE, CAPILLARY
Glucose-Capillary: 141 mg/dL — ABNORMAL HIGH (ref 70–99)
Glucose-Capillary: 247 mg/dL — ABNORMAL HIGH (ref 70–99)

## 2010-08-15 LAB — CBC
Hemoglobin: 8.9 g/dL — ABNORMAL LOW (ref 13.0–17.0)
MCHC: 34.1 g/dL (ref 30.0–36.0)
RDW: 19.2 % — ABNORMAL HIGH (ref 11.5–15.5)
WBC: 13.7 10*3/uL — ABNORMAL HIGH (ref 4.0–10.5)

## 2010-08-15 LAB — URINE CULTURE

## 2010-08-17 LAB — CULTURE, BLOOD (ROUTINE X 2): Culture  Setup Time: 201206150046

## 2010-08-22 ENCOUNTER — Ambulatory Visit (INDEPENDENT_AMBULATORY_CARE_PROVIDER_SITE_OTHER): Payer: BC Managed Care – PPO | Admitting: Family Medicine

## 2010-08-22 ENCOUNTER — Encounter: Payer: Self-pay | Admitting: Family Medicine

## 2010-08-22 DIAGNOSIS — F1010 Alcohol abuse, uncomplicated: Secondary | ICD-10-CM

## 2010-08-22 DIAGNOSIS — J690 Pneumonitis due to inhalation of food and vomit: Secondary | ICD-10-CM

## 2010-08-22 DIAGNOSIS — K259 Gastric ulcer, unspecified as acute or chronic, without hemorrhage or perforation: Secondary | ICD-10-CM

## 2010-08-22 NOTE — Patient Instructions (Addendum)
It was good to see you today I am checking some blood work  I will call you with the results Come back to see me in 1 week Avoid ANY alcohol intake, including beer If you have NAY chest pain, shortness of breath, or any other concerning symptoms, please give [place] a call or go to the emergency department.  Call with any questions,  God Bless Doree Albee MD

## 2010-08-23 NOTE — Discharge Summary (Signed)
NAMEPARMVIR, [name] [name] NO.:  [id number]  MEDICAL RECORD NO.:  [id number]  LOCATION:  3003                         FACILITY:  [facility]  PHYSICIAN:  Kela Millin, M.D.DATE OF BIRTH:  September 04, 1960  DATE OF ADMISSION:  08/10/2010 DATE OF DISCHARGE:  08/15/2010                        DISCHARGE SUMMARY - REFERRING   DISCHARGE DIAGNOSES: 1. Antral ulcerations with mild distal esophagitis. 2. Upper Gastrointestinal bleed -- secondary to antral ulcerations     with mild distal esophagitis. 3. Probable aspiration pneumonia. 4. Systemic inflammatory response syndrome -- secondary to probable     aspiration pneumonia. 5. Alcohol abuse. 6. Acute respiratory failure. 7. Hyponatremia -- resolved. 8. Altered mental status/unresponsiveness -- multifactorial, resolved.     MRI negative for acute findings. 9. History of diabetes -- not on any medications, follow up     outpatient. 10.Hypertension. 11.Depression. 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 13.History of possible pontine stroke. 14.Dyslipidemia. 15.History of gout. 16.History of peptic ulcer disease. 17.Acute blood loss anemia -- secondary to antral ulcerations with     mild distal esophagitis and upper gastrointestinal bleed.  PROCEDURES AND STUDIES: 1. Chest x-ray on August 10, 2010 -- endotracheal tube tip in     satisfactory position approximately 5 cm above the carina.     Nasogastric tube in the fundus of the stomach.  No acute     cardiopulmonary disease. 2. CT scan of the head -- ischemia/infarction involving the pons and     mid brain, and non hemorrhagic, age indeterminate.  Moderate     generalized atrophy and mild cerebral atrophy.  Severe chronic     microvascular ischemic changes of the white matter diffusely with     old lacunar strokes in the deep white matter, also left frontal     lobe and the right basal ganglia. 3. MRI of the brain on August 11, 2010 --  no identifiable acute or     subacute infarction.  Extensive ischemic changes throughout the     brain as outlined above.  A few scattered foci of hemosiderin     deposition related to old hemorrhagic insults. 4. MRA -- negative intracranial MRA of the large and medium-sized     vessels.  No occlusion of correctable proximal stenosis. 5. Follow up chest x-ray on August 11, 2010 -- appropriate support,     apparatus position, although the tip of the nasogastric tube itself     is not well visualized. 6. Follow up chest x-ray on August 12, 2010 -- increased in mild     bibasilar atelectasis following extubation. 7. Chest x-ray on August 14, 2010 -- slight increase in mid and lower     lung opacities bilaterally most likely represent atelectasis and     follow closely to exclude developing infiltrates. 8. Endotracheal intubation on August 10, 2010 per the ER physician. 9. Extubation on August 11, 2010. 10.EEG on August 11, 2010 -- this is a very mildly abnormal EEG due to     the generalized slowing in the high delta ranges that is reactive.There is no evidence to suggest electrographic seizures or  epileptiform discharges based on the study. 11.EGD on August 13, 2010 by Dr. Dulce Sellar -- mild distal esophagitis.     Antral ulcerations, clean based likely representing source of the     patient's anemia. 12.Incidental finding of the duodenal bulb and descending duodenum     biopsies, likely not of significance regarding bleeding. 13.Right IJ placement on August 10, 2010.  CONSULTATIONS: 1. Gastroenterology -- [facility] Dr. Dulce Sellar. 2. This patient was admitted to the critical care service and stayed     on that service form August 10, 2010 through August 13, 2010.  BRIEF HISTORY:  The patient is a 50 year old male with the above-listed medical problems who presented with altered mental status.  He was found slumped over by his family and it was reported that he had not been feeling well for at least 1 week had  been tired, fatigued and weak in his legs.  On the day of presentation, it was reported that he had an episode of hematemesis and when he was brought to the ED, he was found to have an elevated white cell count of 41.1 and hemoglobin was 6.6, hematocrit of 19.8 and his sodium was 125.  The patient was also noted to be partially self anticoagulated with an INR of 1.65.  A CT scan of his brain was done and was read as ischemia/infarction involving the pons and mid brain, non hemorrhagic and age indeterminate.  He was hypotensive initially with a blood pressure of 98/53 and his lactic acid level was elevated at 15.2 and he was intubated per ED physician and an ABG done following that revealed a pH of 7.11 with a pCO2 of 55.2, pO2 of 413, bicarb of 17.7, O2 sat of 100% and the patient was admitted to the critical care service.  The patient also had right IJ placed in the ED.  HOSPITAL COURSE: 1. Acute respiratory failure -- as above the patient was intubated in     the ED on arrival and CCM was consulted and they admitted him to     the critical care service.  Chest x-ray was done following the     intubation and the results as stated above.  The patient was placed     on empiric broad-spectrum antibiotics per critical care to cover     for probable aspiration pneumonia.  Blood cultures were obtained     and they remained negative.  CCM followed the patient in the unit     his respiratory status stabilized and he was extubated on August 11, 2010.  CCM followed and his leukocytosis gradually improved and his     antibiotics were changed to Cipro.  The patient has been     oxygenating well on room air and his white cell count improved from     41.1 on admission to 13.7 today and he is remaining afebrile and     hemodynamically stable.  He will be discharged on oral antibiotics     and is to follow up with his primary care physician. 2. Probable aspiration pneumonia -- as discussed above.   Follow up     with his PCP.  He is to have a repeat chest x-ray to ensure     clearing of the infiltrates.  As above, he has been discharged on     oral antibiotics. 3. Upper Gastrointestinal bleed/anemia -- it was reported that the     patient had hematemesis on the day of  admission and his hemoglobin     on admission was 6.6.  He was transfused packed red blood cells and     received a total of 4 units of packed red blood cells during this     hospital stay.  GI was consulted and they followed the patient and     did an EGD on August 13, 2010 which revealed antral ulcerations clean     based and per GI representing likely source of the anemia.     Biopsies were done and the patient is to follow up outpatient for     results of the biopsies and further management as appropriate.  His     hemoglobin following the transfusions improved and today remaining     stable at 8.9.  He has had no further melena or hematemesis, and he     is tolerating p.o.'s well.  He was maintained on a PPI during this     hospital stay and is to continue it the b.i.d. dosing upon     discharge and follow up outpatient. 4. Hyponatremia -- the patient was hydrated with IV fluids and with     that, his hyponatremia improved from 125 on admission to 134 on     August 14, 2010.  The impression was that it was secondary to volume     depletion. 5. Alcohol abuse -- the patient was counseled to quit alcohol and     social services offered him community resources for this, but he     declined. 6. Unresponsiveness/altered mental status -- upon admission, the     patient had a CT scan of his brain and pontine CVA was questioned.     An MRI was done and did not reveal any acute findings, it was noted     that the patient does have a prior history of an old pontine     infarct and MRI showed chronic small vessel ischemic changes     affecting the pons and old small lacunar pontine infarction.  The     impression was that his  altered mental status was multifactorial,     likely secondary to infection, hyponatremia, alcohol.  He has     improved to his baseline at this time.  He will be discharged home     and is to follow up outpatient.  He did remember today that he is     followed at the [facility] and that he sees Dr. Doree Albee and is to follow up outpatient with him. 7. Hypertension -- the patient is to continue his outpatient     medications upon discharge. 8. History of depression -- the patient is to continue his outpatient     medications upon discharge. 9. Systemic inflammatory response syndrome -- the patient had blood     cultures done on admission and was placed on empiric antibiotics.     The cultures did not grow any bacteria.  With antibiotics, he     remained afebrile and is hemodynamically stable at this time and     his leukocytosis is decreased.  The impression was it was secondary     to probable aspiration pneumonia.  His urinalysis and urine     cultures came back negative for infection.  DISCHARGE MEDICATIONS: 1. Cipro 500 mg one p.o. b.i.d. for 5 more days. 2. Prilosec 40 mg p.o. b.i.d. 3. Allopurinol 300 mg p.o. daily. 4. Fluoxetine 40  mg p.o. q.a.m. 5. Folic acid 1 mg p.o. daily. 6. Lithium carbonate 450 mg p.o. q.a.m. 7. Lovaza 2 capsules b.i.d. 8. Metoprolol 100 mg p.o. b.i.d. 9. Spironolactone 50 mg p.o. daily. 10.Tramadol 50 mg p.o. b.i.d. previously. 11.Vitamin D 100 mg p.o. daily. 12.Multivitamin tablet 1 p.o. daily as previously. 13.The patient to hold off aspirin until he follows up with GI     outpatient. 14.Discharge condition -- improved/stable.  FOLLOWUP CARE: 1. Dr. Doree Albee with the [facility] in 1     week, call for appointment. 2. Dr. Herbie Drape with [facility] in 1-2 weeks, call for     appointment. 3. Discharge condition -- improved/stable.     Kela Millin, M.D.     ACV/MEDQ  D:   08/15/2010  T:  08/15/2010  Job:  [id number]  cc:   Doree Albee, MD Willis Modena, MD  Electronically Signed by Donnalee Curry M.D. on 08/23/2010 07:34:39 AM

## 2010-08-27 NOTE — Progress Notes (Signed)
  Subjective:    Patient ID: [name] [name], male    DOB: 08-21-1960, 50 y.o.   MRN: [id number]  HPI Pt here for hospital follow up Pt was admitted 6/14-6/19 at [facility] with relatively complicated hospital course  for AMS, upper GI bleed, ABLA secondary to upper GI bleed,  Respiratory distress secondary to aspiration PNA w/ subsequent short term intubation. Pt was admitted by hospitalized by hospitalist service secondary to pt being initially found down. Please see 08/15/10 EChart discharge summary for full details.  AMS- Pt was ruled out for stroke by MRI in house. AMS was thought to be multifactorial w/ baseline hx/o chronic ETOH abuse in addition to ABLA, hematemesis.  Upper GI bleed- upper antral ulcerations by endoscopy. Received 4 units pRBCs in house. Placed on BID prilosec.  ABLA- Admission hgb 6.6; d/c hb 8.9 s/p 4 units pRBCs. Etiology likely from upper GI bleed.  Resp failure: This was thought to be secondary to aspiration PNA in setting of hematemesis and GI bleed. Intubated for approx 24 hours. Treated for aspiration PNA. Initial WBC @ 41 noted on hospital admission. WBC 13.7 on discharge.  Pt initially treated w/ broad spectrum abx and narrowed to cipro by CCM. Total of 10 days of abx.  ETOH- Alcohol abuse resources offered to pt. Pt refused resources. Stated that he would be able to self wean off of alcohol. Reiterated secondary consequences of abuse including recent admission and importance of abstinence. Pt expressed understanding. Wife states that she will bring the issue up again with the pt.    -Today--> Pt/wife states that pt is near baseline. Pt has had some mild slurred speech. However this has been present since 1-2 days  prior to hospital discharge. No worsening in confusion per pt/wife. No noted fever, CP, SOB, increased WOB. No hematemesis, black/tarry stools. No abd pain, dysuria. Pt completed course of cipro for aspiration PNA approx 3 days ago. Has been compliant to discharge  medication regimen.  -Pt states that he has not had any hard liquor since hospital discharge. Has had 1-2 beers total since hospital discharge.  Of note pt/wife state that pt has been unable to drink his usual regimen (1/5th liquor weekly, 1/2 case of beer daily) since approx 4-5 days prior to hospital admission.     Review of Systems See HPI, otherwise 12 point ROS negative.      Objective:   Physical Exam Gen- up in bed, NAD  CV: RRR, no murmurs auscultated  PULM: CTAB, no wheezes, rales, rhoncii, good overall air mov't.  ABD: mildly obese, soft, +hepatomegaly w/ liver edge measuring 4-5 cm below costal margin, no visible caput medusa  EXT: 2+ peripheral pulses, no edema Neuro: CN II-XII grossly intact. Cerebellar testing WNL. Mild asterixis.      Assessment & Plan:  GI Bleed; Clinically resolved. WIll check CBC to correlate. Pending outpt GI followup in 1-2 weeks. Stressed importance of follow up. Pt/wife agreeable.  Resp: Clinically asymptomatic/resolved. Lungs CTA, good overall air movement. Will continue to follow. Red flags discussed with pt.  ETOH: Pt with noted asterixis on exam. Inpatient labs reviewed. Ammonia level was not drawn. I suspect pt may need lactulose as there is likely a hepatic encephalopathy to pt's overall presentation. WIll also check electrolytes and liver function. Alcohol recovery resources re-offered to pt. Pt still choosing to self-wean.

## 2010-08-28 DIAGNOSIS — K279 Peptic ulcer, site unspecified, unspecified as acute or chronic, without hemorrhage or perforation: Secondary | ICD-10-CM | POA: Insufficient documentation

## 2010-08-28 DIAGNOSIS — J690 Pneumonitis due to inhalation of food and vomit: Secondary | ICD-10-CM | POA: Insufficient documentation

## 2010-08-28 NOTE — Assessment & Plan Note (Signed)
Clinically resolved. WIll check CBC to correlate. Pending outpt GI followup in 1-2 weeks. Stressed importance of follow up. Pt/wife agreeable

## 2010-08-28 NOTE — Assessment & Plan Note (Signed)
Secondary to hematemesis. Clinically asymptomatic/resolved. Lungs CTA, good overall air movement. Will continue to follow. Red flags discussed with pt.

## 2010-08-28 NOTE — Assessment & Plan Note (Signed)
Pt with noted asterixis on exam. Inpatient labs reviewed. Ammonia level was not drawn. I suspect pt may need lactulose as there is likely a hepatic encephalopathy to pt's overall presentation. WIll also check electrolytes and liver function. Alcohol recovery resources re-offered to pt. Pt still choosing to self-wean.

## 2010-09-01 ENCOUNTER — Ambulatory Visit (INDEPENDENT_AMBULATORY_CARE_PROVIDER_SITE_OTHER): Payer: BC Managed Care – PPO | Admitting: Family Medicine

## 2010-09-01 ENCOUNTER — Encounter: Payer: Self-pay | Admitting: Family Medicine

## 2010-09-01 DIAGNOSIS — J189 Pneumonia, unspecified organism: Secondary | ICD-10-CM

## 2010-09-01 DIAGNOSIS — J690 Pneumonitis due to inhalation of food and vomit: Secondary | ICD-10-CM

## 2010-09-01 DIAGNOSIS — K259 Gastric ulcer, unspecified as acute or chronic, without hemorrhage or perforation: Secondary | ICD-10-CM

## 2010-09-01 DIAGNOSIS — K7682 Hepatic encephalopathy: Secondary | ICD-10-CM

## 2010-09-01 DIAGNOSIS — F319 Bipolar disorder, unspecified: Secondary | ICD-10-CM

## 2010-09-01 DIAGNOSIS — K7290 Hepatic failure, unspecified without coma: Secondary | ICD-10-CM

## 2010-09-01 DIAGNOSIS — I10 Essential (primary) hypertension: Secondary | ICD-10-CM

## 2010-09-01 LAB — COMPREHENSIVE METABOLIC PANEL
AST: 11 U/L (ref 0–37)
BUN: 21 mg/dL (ref 6–23)
Calcium: 9.3 mg/dL (ref 8.4–10.5)
Chloride: 110 mEq/L (ref 96–112)
Creat: 1.36 mg/dL — ABNORMAL HIGH (ref 0.50–1.35)
Total Bilirubin: 0.3 mg/dL (ref 0.3–1.2)

## 2010-09-01 LAB — CBC WITH DIFFERENTIAL/PLATELET
Basophils Absolute: 0.1 10*3/uL (ref 0.0–0.1)
Basophils Relative: 0 % (ref 0–1)
Eosinophils Absolute: 0.5 10*3/uL (ref 0.0–0.7)
Eosinophils Relative: 3 % (ref 0–5)
HCT: 34.3 % — ABNORMAL LOW (ref 39.0–52.0)
Lymphocytes Relative: 22 % (ref 12–46)
MCH: 31 pg (ref 26.0–34.0)
MCHC: 31.5 g/dL (ref 30.0–36.0)
MCV: 98.6 fL (ref 78.0–100.0)
Monocytes Absolute: 1.5 10*3/uL — ABNORMAL HIGH (ref 0.1–1.0)
RDW: 18.6 % — ABNORMAL HIGH (ref 11.5–15.5)

## 2010-09-01 MED ORDER — LACTULOSE 10 GM/15ML PO SOLN: 20.0000 g | Freq: Three times a day (TID) | ORAL | Status: DC

## 2010-09-01 NOTE — Patient Instructions (Signed)
It was good to see you today  I am starting you on lactulose for your liver disease STOP the norvasc as this is lowering your blood pressure Avoid ANY alcohol Come back to see me in 1 week If you develop any concerning symptoms, please call or come to the [facility] God Bless,  Doree Albee MD

## 2010-09-03 DIAGNOSIS — K7682 Hepatic encephalopathy: Secondary | ICD-10-CM | POA: Insufficient documentation

## 2010-09-03 DIAGNOSIS — K7290 Hepatic failure, unspecified without coma: Secondary | ICD-10-CM | POA: Insufficient documentation

## 2010-09-03 HISTORY — DX: Hepatic encephalopathy: K76.82

## 2010-09-03 HISTORY — DX: Hepatic failure, unspecified without coma: K72.90

## 2010-09-03 NOTE — Assessment & Plan Note (Signed)
Soft BPs noted today. Will hold norvasc.

## 2010-09-03 NOTE — Progress Notes (Signed)
  Subjective:    Patient ID: [name] [name], male    DOB: May 06, 1960, 50 y.o.   MRN: [id number]  HPI Pt here for chronic problem follow up: -Pt recently admitted to hospital for multiple issues including upper GI bleed, aspiration PNA w/ 24 hours of VDRF.  Pt seen last week for hospital follow up: No acute issues since last clinic visit. Pt denies any weakness, confusion, increased WOB. No hematemesis, black/tarry stools. No chest pain.  Pt states that he has had total of 1 beer since last clinical visit. Has been compliant with multivitamin, thiamine and folate.    Review of Systems See HPI     Objective:   Physical Exam Gen- up in bed, NAD  CV: RRR, no murmurs auscultated  PULM: CTAB, no wheezes, rales, rhoncii, good overall air mov't.  ABD: mildly obese, soft, +hepatomegaly w/ liver edge measuring 4-5 cm below costal margin, no visible caput medusa  EXT: 2+ peripheral pulses, no edema  Neuro: CN II-XII grossly intact. Cerebellar testing WNL. Mild asterixis.    Assessment & Plan:

## 2010-09-03 NOTE — Assessment & Plan Note (Addendum)
Ammonia @ >120 from last weeks labs. Will start on lactulose. Pt and wife counseled on sxs and importance of compliance.

## 2010-09-03 NOTE — Assessment & Plan Note (Signed)
Recent lithium level subtherapeutic. No HI/SI. Will repeat and likely redose of persistently low.

## 2010-09-03 NOTE — Assessment & Plan Note (Signed)
Clinically stable. No noted increased WOB on exam. O2 sat >95% on RA. Has completed 10 day course of abx. Will continue to follow clinically.

## 2010-09-06 ENCOUNTER — Ambulatory Visit (INDEPENDENT_AMBULATORY_CARE_PROVIDER_SITE_OTHER): Payer: BC Managed Care – PPO | Admitting: Family Medicine

## 2010-09-06 ENCOUNTER — Encounter: Payer: Self-pay | Admitting: Family Medicine

## 2010-09-06 VITALS — BP 119/82 | HR 65 | Temp 98.0°F | Wt 221.0 lb

## 2010-09-06 DIAGNOSIS — F319 Bipolar disorder, unspecified: Secondary | ICD-10-CM

## 2010-09-06 DIAGNOSIS — F1010 Alcohol abuse, uncomplicated: Secondary | ICD-10-CM

## 2010-09-06 DIAGNOSIS — R634 Abnormal weight loss: Secondary | ICD-10-CM

## 2010-09-06 DIAGNOSIS — K7682 Hepatic encephalopathy: Secondary | ICD-10-CM

## 2010-09-06 DIAGNOSIS — K7290 Hepatic failure, unspecified without coma: Secondary | ICD-10-CM

## 2010-09-06 MED ORDER — LACTULOSE 10 GM/15ML PO SOLN: 20.0000 g | Freq: Two times a day (BID) | ORAL | Status: DC

## 2010-09-06 NOTE — Assessment & Plan Note (Signed)
Clinically improving with lactulose. Will titrate back to BID dosing.  Will recheck ammonia level.

## 2010-09-06 NOTE — Assessment & Plan Note (Signed)
Appetite increasing. Will continue to follow.

## 2010-09-06 NOTE — Assessment & Plan Note (Signed)
No report of GI bleeding. Will continue to clinically follow.

## 2010-09-06 NOTE — Assessment & Plan Note (Signed)
Lithium level discussed with Dr. Raymondo Band. Most recent level therapeutic. Mood overall stable. Will continue on current regimen.

## 2010-09-06 NOTE — Patient Instructions (Signed)
It was good to see you today  Cut back the lactulose to twice daily  Continue with your multivitamins Continue with the rest of your medications Come back to see me in 2 weeks for a follow up visit on your general health, Please call if you have any questions. God Bless, Doree Albee MD

## 2010-09-06 NOTE — Assessment & Plan Note (Signed)
Abstinent x 2 weeks. Encouraged abstinence.

## 2010-09-06 NOTE — Progress Notes (Signed)
  Subjective:    Patient ID: [name] [name], male    DOB: [birth year], 50 y.o.   MRN: [id number]  HPI Pt here for chronic problem follow up in setting of chronic ETOH abuse and recent admission for GI bleeding and aspiration PNA. -NO ETOH intake over last 2 weeks.  Has been taking lactulose tid. Up to 4-5 BMs daily. Now down to 2-3 BMs daily.  -Pt/wife feel  that mentation has improved since starting lactulose  -Is taking multivamin, folate and thiamine -No CP, SOB, emesis, abd pain, nausea, diarrhea.  -No BRBPR, no melenotic stools -Appetite is picking up per wife -Has not had any alcohol intake over the last 2 weeks.  -Still smoking,is not ready to quit Review of Systems See HPI, otherwise 12 point ROS negative.   Objective:   Physical Exam Gen- up in bed, NAD  CV: RRR, no murmurs auscultated  PULM: CTAB, no wheezes, rales, rhoncii, good overall air mov't.  ABD: mildly obese, soft, +hepatomegaly w/ liver edge measuring 4-5 cm below costal margin, no visible caput medusa  EXT: 2+ peripheral pulses, no edema  Neuro: CN II-XII grossly intact. Cerebellar testing WNL. Minimal asterixis.     Assessment & Plan:

## 2010-09-11 NOTE — Op Note (Signed)
  [name] [name], TIGGS NO.:  [id number]  MEDICAL RECORD NO.:  [id number]  LOCATION:  3003                         FACILITY:  [facility]  PHYSICIAN:  Willis Modena, MD     DATE OF BIRTH:  [birth year]  DATE OF PROCEDURE:  08/13/2010 DATE OF DISCHARGE:                              OPERATIVE REPORT   ASA III, Mallampati 2.  CONSULTING PHYSICIAN:  Leslye Peer, MD.  INDICATION:  [name] is a very pleasant 50 year old gentleman presenting for evaluation of altered mental status.  He has completed stroke workup in his baseline which was negative and his baseline mental status has been reachieved.  He has been having some melena with black stools.  He has been having black stools with anemia for the past week and a half.  We have been asked to perform an endoscopy for further evaluation.  FINDINGS:  After explaining risks and benefits of the procedure, as well as explanation of diagnostic alternatives, after concluding a physician led pause, procedure commenced with forward viewing endoscope.  The patient had some mild distal esophagitis.  The esophagus was otherwise normal without evidence of stricture, mass varices, or Barrett's epithelium.  The stomach had a few clean based antral erosions which could represent a source of melena.  There were no stigmata to suggest high risk for rebleeding.  The stomach was otherwise normal.  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  The descending duodenum likewise was a bit abnormal with some scalloping of the folds.  This is likely not of significance but biopsies were taken given his anemia to evaluate for celiac disease.  There was no old or fresh blood seen to the extent of our examination.  IMPRESSION: 1. Mild distal esophagitis. 2. Antral ulcerations, clean based, likely representing source of the     patient's  anemia. 3. Incidental finding of the duodenal bulb and descending duodenum,     biopsied as above, likely not of significance regarding bleeding.  PLAN: 1. Watch for potential complication procedure including bleeding and     perforation. 2. Await biopsies. 3. Transition to twice daily oral proton pump inhibitor. 4. Advance diet. 5. No further GI evaluation anticipated at this time.     Willis Modena, MD     WO/MEDQ  D:  08/13/2010  T:  08/13/2010  Job:  [id number]  Electronically Signed by Willis Modena  on 09/11/2010 01:15:09 PM

## 2010-09-13 NOTE — Consult Note (Signed)
NAMEYADER, CRIGER NO.:  [id number]  MEDICAL RECORD NO.:  [id number]  LOCATION:  3106                         FACILITY:  [facility]  PHYSICIAN:  Marlan Palau, M.D.  DATE OF BIRTH:  [birth year]  DATE OF CONSULTATION:  08/10/2010 DATE OF DISCHARGE:                                CONSULTATION   HISTORY OF PRESENT ILLNESS:  [name] [name] is a 50 year old right- handed black male born on 1960/11/20, with a history of hypertension and diabetes and alcohol and tobacco abuse as well as marijuana.  This patient was last seen normal at 1:15 p.m. today.  The patient was found slumped over at 1:30 p.m.  The patient had not been feeling well for at least a week and was feeling tired and fatigued, weak in the legs.  The patient was noted to have hematemesis today.  The patient was brought into the emergency room and was found to have a white count of 41.1, hemoglobin of 6.6, hematocrit of 19.8, and a sodium of 125.  The patient was partially self-anticoagulated at 1.65.  The patient is on lithium. A CT scan of the brain questioned a pontine infarct.  The patient has had restriction of eye movements, decreased responsiveness, required intubation.  Neurology was asked to see this patient for further evaluation.  PAST MEDICAL HISTORY:  Significant for: 1. Possible pontine stroke. 2. Alcohol abuse. 3. History of closed head injury in the past with chronic gait     disorder. 4. Peptic ulcer disease. 5. History of GI bleed this admission. 6. Hypertension. 7. Diabetes. 8. Depression. 9. Dyslipidemia. 10.History of gout.  The patient smokes a half pack of cigarettes a day.  Drinks a fifth of alcohol daily.  Smokes marijuana.  MEDICATIONS: 1. Prozac 40 mg daily. 2. Lovaza 1 g twice daily. 3. Metoprolol 100 mg twice daily. 4. Aldactone 50 mg daily. 5. Aspirin 81 mg daily. 6. Norvasc 10 mg daily. 7. Thiamine 100 mg daily. 8. Allopurinol 300 mg daily. 9. Ultram 50  mg twice daily. 10.Folic acid 1 mg daily. 11.Lithium ER 450 mg daily. 12.Multivitamins daily.  ALLERGIES:  The patient has allergies to ACE INHIBITORS which cause angioedema and PENICILLIN, reaction unknown.  SOCIAL HISTORY:  This patient is married, lives in the [place] area.  He is on disability, has four children.  The patient is followed through the [facility].  The patient also is adopted, family medical history is unknown.  Unable to obtain review of systems directly as the patient is intubated. The patient shakes his head no indicating no pain in the head, neck, chest, and abdomen.  No numbness of the extremities.  PHYSICAL EXAMINATION:  VITAL SIGNS:  Blood pressure is 85/53, heart rate 88, respiratory rate 16, temperature afebrile. GENERAL:  This patient is a thin, alert, black male, intubated.  Neck brace is in place. HEENT:  Pupils are 4 mm, symmetric, minimally reactive. LUNGS:  The patient has clear lung fields. CARDIOVASCULAR:  Regular rate and rhythm.  No obvious murmurs, rubs noted. EXTREMITIES:  Without significant edema. NEUROLOGIC:  Cranial nerves as above.  The patient is unable to generate lateral or  vertical eye movements.  He has full visual fields; however, he is unable to grimace the face to command.  The patient is able to move all fours, however, and has minimal or no drift on the arms or legs.  No obvious ataxia seen with the extremities.  Deep tendon reflexes somewhat brisk throughout.  Upgoing toe on the left, neutral on the right.  The patient cannot talk because of intubation.  The patient indicates symmetric pinpricks and soft touch to vibratory.  Sensation throughout.  No obvious extinction.  Laboratory values are notable for the urine drug screen positive for THC.  The patient has a white count of 41.1, hemoglobin 6.6, hematocrit 19.8, MCV of 101.5, platelets cannot be determined as they were  clumped. INR of 1.65.  Sodium 125, potassium 5.5, chloride of 87, CO2 of 7, glucose of 425, BUN of 56, creatinine 2.15, total bili 0.1, alk phosphatase 77, SGOT of 53, SGPT of 40, total protein 6.0, albumin of 1.9, calcium 10.2, and CK of 420, MB fraction 0.7.  Alcohol less than 11.  Urinalysis reveals specific gravity of 1.015, pH of 5.5, otherwise, unremarkable.  IMPRESSION: 1. Probable pontine stroke. 2. Gastrointestinal bleed. 3. Alcohol abuse. 4. Hyponatremia.  This patient likely sustained a pontine stroke, although I suspect that the pontine myelolysis does need to be considered.  Sodiums are low and may be in part secondary to lithium use.  The patient will need further workup for possible stroke event.  The patient is not a t-PA candidate secondary to GI bleed and duration of symptoms.  NIH Stroke Scale Score was 4.  The patient will be set up for an MRI of the head and MRA of the head.  A 2-D echocardiogram will be ordered. Further assessment once the patient is extubated may be helpful.  The patient will be followed throughout this hospitalization.     Marlan Palau, M.D.     CKW/MEDQ  D:  08/10/2010  T:  08/11/2010  Job:  [id number]  cc:   [facility]  Electronically Signed by Thana Farr M.D. on 09/13/2010 08:22:39 AM

## 2010-09-24 ENCOUNTER — Other Ambulatory Visit: Payer: Self-pay | Admitting: Family Medicine

## 2010-09-24 NOTE — Telephone Encounter (Signed)
Refill request

## 2010-09-29 ENCOUNTER — Ambulatory Visit: Payer: BC Managed Care – PPO | Admitting: Family Medicine

## 2010-10-02 ENCOUNTER — Encounter: Payer: Self-pay | Admitting: Family Medicine

## 2010-10-02 ENCOUNTER — Ambulatory Visit (INDEPENDENT_AMBULATORY_CARE_PROVIDER_SITE_OTHER): Payer: BC Managed Care – PPO | Admitting: Family Medicine

## 2010-10-02 VITALS — BP 136/96 | HR 61 | Ht 72.0 in | Wt 225.0 lb

## 2010-10-02 DIAGNOSIS — I10 Essential (primary) hypertension: Secondary | ICD-10-CM

## 2010-10-02 DIAGNOSIS — R634 Abnormal weight loss: Secondary | ICD-10-CM

## 2010-10-02 DIAGNOSIS — K7290 Hepatic failure, unspecified without coma: Secondary | ICD-10-CM

## 2010-10-02 DIAGNOSIS — F1010 Alcohol abuse, uncomplicated: Secondary | ICD-10-CM

## 2010-10-02 DIAGNOSIS — K7682 Hepatic encephalopathy: Secondary | ICD-10-CM

## 2010-10-02 MED ORDER — OMEGA-3-ACID ETHYL ESTERS 1 G PO CAPS: 2.0000 g | ORAL_CAPSULE | Freq: Two times a day (BID) | ORAL | Status: DC

## 2010-10-02 MED ORDER — SPIRONOLACTONE 50 MG PO TABS: 50.0000 mg | ORAL_TABLET | Freq: Every day | ORAL | Status: DC

## 2010-10-02 NOTE — Patient Instructions (Signed)
It was good to see you today Continue to abstain from alcohol.  This includes liquor and beer Try drinking ensure 2-3 times a day until you are eating regular meals daily.  I will refill you spironolactone.  Come back to see me in 4 weeks  We will check blood work at that time Call with any other questions,  God Bless, Doree Albee MD

## 2010-10-02 NOTE — Progress Notes (Signed)
  Subjective:    Patient ID: [name] [name], male    DOB: January 22, 1961, 50 y.o.   MRN: [id number]  HPI Pt here for chronic problem follow up in setting of chronic ETOH abuse and recent admission for GI bleeding and aspiration PNA.  -NO ETOH intake over last week. Had 1-2 beers last week.  Has been taking lactulose tid over last 3-4 weeks. No down to 2-3 BMs.  -Mentation has been stable since taking.  -Is taking multivamin, folate and thiamine  -No CP, SOB, emesis, abd pain, nausea, diarrhea.  -No BRBPR, no melenotic stools  -Appetite is stable.  -Has not had any alcohol intake over the last 2 weeks.  -Still smoking,is not ready to quit   Review of Systems See HPI, otherwise 12 point ROS negative.      Objective:   Physical Exam Physical Exam  Gen- up in bed, NAD  CV: RRR, no murmurs auscultated  PULM: CTAB, no wheezes, rales, rhoncii, good overall air mov't.  ABD: mildly obese, soft, +hepatomegaly w/ liver edge measuring 4-5 cm below costal margin, no visible caput medusa  EXT: 2+ peripheral pulses, no edema  Neuro: CN II-XII grossly intact. Cerebellar testing WNL. Minimal asterixis.     Assessment & Plan:

## 2010-10-03 ENCOUNTER — Encounter: Payer: Self-pay | Admitting: Family Medicine

## 2010-10-03 NOTE — Assessment & Plan Note (Signed)
1-2 beers over the last 2 weeks. While not complete abstinence, this is a marked improvement from 4 months ago (drinking up to 1/5th liquor daily and 1-2 cases daily). Pt's wife has been very instrumental in trying to mitigate abstinence process; although I suspect that she may have issues with ETOH use as well in review of [facility] questions (Wife is also [facility] pt)  Pt still refusing AA resources.  Will continue to follow closely as to reinforce abstinence.

## 2010-10-03 NOTE — Assessment & Plan Note (Addendum)
Mildly elevated. Has 1-2 doses of aldactone. Will refill. Will hold off on labs today. Will recheck labs in 1 month.

## 2010-10-03 NOTE — Assessment & Plan Note (Signed)
Weight trending up. Will add on ensure tid to regimen while weight is improving to help with adequate nutrition.

## 2010-10-03 NOTE — Assessment & Plan Note (Signed)
Stable on current lactulose regimen. Will hold off on labs today.

## 2010-10-18 ENCOUNTER — Other Ambulatory Visit: Payer: Self-pay | Admitting: Family Medicine

## 2010-10-19 NOTE — Telephone Encounter (Signed)
Refill request

## 2010-11-15 ENCOUNTER — Ambulatory Visit (INDEPENDENT_AMBULATORY_CARE_PROVIDER_SITE_OTHER): Payer: BC Managed Care – PPO | Admitting: Family Medicine

## 2010-11-15 ENCOUNTER — Encounter: Payer: Self-pay | Admitting: Family Medicine

## 2010-11-15 DIAGNOSIS — K7682 Hepatic encephalopathy: Secondary | ICD-10-CM

## 2010-11-15 DIAGNOSIS — K259 Gastric ulcer, unspecified as acute or chronic, without hemorrhage or perforation: Secondary | ICD-10-CM

## 2010-11-15 DIAGNOSIS — K7290 Hepatic failure, unspecified without coma: Secondary | ICD-10-CM

## 2010-11-15 DIAGNOSIS — I10 Essential (primary) hypertension: Secondary | ICD-10-CM

## 2010-11-15 DIAGNOSIS — F319 Bipolar disorder, unspecified: Secondary | ICD-10-CM

## 2010-11-15 DIAGNOSIS — F1010 Alcohol abuse, uncomplicated: Secondary | ICD-10-CM

## 2010-11-15 LAB — COMPREHENSIVE METABOLIC PANEL
AST: 17 U/L (ref 0–37)
Albumin: 4.1 g/dL (ref 3.5–5.2)
Alkaline Phosphatase: 88 U/L (ref 39–117)
Glucose, Bld: 91 mg/dL (ref 70–99)
Potassium: 4.7 mEq/L (ref 3.5–5.3)
Sodium: 137 mEq/L (ref 135–145)
Total Protein: 7.4 g/dL (ref 6.0–8.3)

## 2010-11-15 MED ORDER — SPIRONOLACTONE 50 MG PO TABS: 50.0000 mg | ORAL_TABLET | Freq: Every day | ORAL | Status: DC

## 2010-11-15 MED ORDER — LACTULOSE 10 GM/15ML PO SOLN: 20.0000 g | Freq: Two times a day (BID) | ORAL | Status: AC

## 2010-11-15 NOTE — Patient Instructions (Signed)
It was good to see you today  Overall you are doing much better Continue to take to the lactulose and other medications as prescribed AVOID ANY ALCOHOL INTAKE Try to limit the amount of salt that you eat Come back to see me in 6 weeks,  Call with any questions.  God Bless,  Doree Albee MD

## 2010-11-15 NOTE — Progress Notes (Signed)
  Subjective:    Patient ID: [name] [name], male    DOB: 1960-02-28, 50 y.o.   MRN: [id number]  HPI Pt is here for chronic problem follow up:   ETOH Abuse: Pt previously admitted for GI bleeding epsidoe assd with ETOH abuse. This was in 07/2010. Pt has had recurrent follow up since being discharged in the hosptial for this. Since this point, pt states that he has had no liquor intake since hospital discharge. Pt states that he last drank beer 2-3 weeks ago. Patient drank one 6 pack at that time. Has been compliant with lactulose use per pt and wife.Feels like appetite has improved. Was previously on TID ensure because of decreased appetite. Is taking once daily now, as regular food intake has improved.  Patient is compliant with multivitamin intake. Patient does report high sodium intake since appetite has increased, including bacon and sausage. Has GI follow up for colonoscopy pending per wife [facility]).   Review of Systems See history of present illness    Objective:   Physical Exam Physical Exam  Gen- up in bed, NAD  CV: RRR, no murmurs auscultated  PULM: CTAB, no wheezes, rales, rhoncii, good overall air mov't.  ABD: mildly obese, soft, +hepatomegaly w/ liver edge measuring 4-5 cm below costal margin, no visible caput medusa  EXT: 2+ peripheral pulses, no edema  Neuro: CN II-XII grossly intact. Cerebellar testing WNL. no asterixis.    Assessment & Plan:

## 2010-11-17 NOTE — Assessment & Plan Note (Signed)
Much improved from initial visit 4-5 months ago. Still with intermittent beer intake. This will be a long-term issue. We'll continue clinically follow and medically optimize. Currently on PPI,  beta blocker, as well as aldosterone antagonist.

## 2010-11-17 NOTE — Assessment & Plan Note (Addendum)
Increased sodium intake in the setting of increased appetite. I suspect this is likely source of increased blood pressure today. We'll hold on making a significant blood pressure medication changes currently with patient instructed to try a  lower salt diet. Patient is also noted to be at increased risk for gout flare given recent diet.  Patient and wife are agreeable to this. We'll follow up in one month. May consider conversion to a more nonselective beta blocker at followup visit if BP still elevated at that time.

## 2010-11-17 NOTE — Assessment & Plan Note (Signed)
On PPI as well as beta blocker. Has outpatient GI followup pending. Otherwise clinically asymptomatic. Will continue to follow with GI.

## 2010-11-17 NOTE — Assessment & Plan Note (Signed)
Could clinically improving with lactulose. No asterixis today. We'll continue on current regimen. Lactulose refill today.

## 2010-11-17 NOTE — Assessment & Plan Note (Addendum)
Has outpatient psych management for this. Still will check lithium level in setting of hepatic disease. Patient states he's been compliant with medication on a daily basis. Lithium level has been relatively stable the last 6 months.

## 2010-11-20 ENCOUNTER — Other Ambulatory Visit: Payer: Self-pay | Admitting: Family Medicine

## 2010-11-20 NOTE — Telephone Encounter (Signed)
Refill request

## 2010-12-12 ENCOUNTER — Other Ambulatory Visit: Payer: Self-pay | Admitting: Family Medicine

## 2010-12-12 NOTE — Telephone Encounter (Signed)
Refill request

## 2010-12-19 ENCOUNTER — Ambulatory Visit (INDEPENDENT_AMBULATORY_CARE_PROVIDER_SITE_OTHER): Payer: BC Managed Care – PPO | Admitting: Family Medicine

## 2010-12-19 ENCOUNTER — Encounter: Payer: Self-pay | Admitting: Family Medicine

## 2010-12-19 DIAGNOSIS — K259 Gastric ulcer, unspecified as acute or chronic, without hemorrhage or perforation: Secondary | ICD-10-CM

## 2010-12-19 DIAGNOSIS — I10 Essential (primary) hypertension: Secondary | ICD-10-CM

## 2010-12-19 DIAGNOSIS — K7682 Hepatic encephalopathy: Secondary | ICD-10-CM

## 2010-12-19 DIAGNOSIS — Z23 Encounter for immunization: Secondary | ICD-10-CM

## 2010-12-19 DIAGNOSIS — F1010 Alcohol abuse, uncomplicated: Secondary | ICD-10-CM

## 2010-12-19 DIAGNOSIS — F319 Bipolar disorder, unspecified: Secondary | ICD-10-CM

## 2010-12-19 DIAGNOSIS — K7290 Hepatic failure, unspecified without coma: Secondary | ICD-10-CM

## 2010-12-19 LAB — COMPREHENSIVE METABOLIC PANEL WITH GFR
ALT: 23 U/L (ref 0–53)
AST: 19 U/L (ref 0–37)
Albumin: 4.1 g/dL (ref 3.5–5.2)
Alkaline Phosphatase: 121 U/L — ABNORMAL HIGH (ref 39–117)
BUN: 21 mg/dL (ref 6–23)
CO2: 20 meq/L (ref 19–32)
Calcium: 9.5 mg/dL (ref 8.4–10.5)
Chloride: 109 meq/L (ref 96–112)
Creat: 1.25 mg/dL (ref 0.50–1.35)
Glucose, Bld: 90 mg/dL (ref 70–99)
Potassium: 5.2 meq/L (ref 3.5–5.3)
Sodium: 137 meq/L (ref 135–145)
Total Bilirubin: 0.6 mg/dL (ref 0.3–1.2)
Total Protein: 7.6 g/dL (ref 6.0–8.3)

## 2010-12-19 NOTE — Assessment & Plan Note (Signed)
Intermittently compliant with lactulose. No asterixis today. Overall stable mentation. Will continue to follow.

## 2010-12-19 NOTE — Assessment & Plan Note (Signed)
BP mildly elevated today. Pt does report high sodium intake. On adlosterone antagonist. Will check Cr and K. Discussed low sodium diet.

## 2010-12-19 NOTE — Patient Instructions (Signed)
It was good  to see today Stop drinking Stop smoking Be sure your eating 3 balanced meals daily We will check some lab work today Followup in 2 months Call with any questions God Bless, Doree Albee MD

## 2010-12-19 NOTE — Assessment & Plan Note (Signed)
No reports of hematemesis. On PPI. Stressed importance of ETOH avoidance.

## 2010-12-19 NOTE — Progress Notes (Signed)
  Subjective:    Patient ID: [name] [name], male    DOB: [birth year], 50 y.o.   MRN: [id number]  HPI Pt is here for general medical follow up.  Patient with background history of alcoholism with admission in June for esophageal varices with secondary hematemesis, aspiration pneumonia, and septis. Patient has recovered from this relatively well (See prior clinic notes and discharge summary for full details).    Patient is noted to be alcohol abstinent for approximately 4 weeks after admission in June. Patient is overall medically optimized with beta blocker, aldosterone antagonist, and secondary encephalopathy management with lactulose.   Today, patient states he has been drinking up to one to 2 beers per day whereas previously he was drinking one beer a week. No headache chest pain shortness of breath fever or confusion or episodes of hematemesis. Pt has been intermittently compliant with lactulose. Pt states that his appetite has also improved, eating regular meals. Pt was previously on ensure TID. Both pt and wife states that he doesn't need this anymore.    Review of Systems See HPI     Objective:   Physical Exam Gen- up in bed, NAD  CV: RRR, no murmurs auscultated  PULM: CTAB, no wheezes, rales, rhoncii, good overall air mov't.  ABD: mildly obese, soft, +hepatomegaly w/ liver edge measuring 4-5 cm below costal marginEXT: 2+ peripheral pulses, no edema  Neuro: CN II-XII grossly intact. Cerebellar testing WNL. no asterixis.    Assessment & Plan:

## 2010-12-19 NOTE — Assessment & Plan Note (Addendum)
Discussed with pt importance of abstinence. Recommended AA as I have in the past. Patient not interested at this time. Stressed importance of abstinence.

## 2010-12-19 NOTE — Assessment & Plan Note (Signed)
Lithium level has been relatively stable thus far. Will recheck today.

## 2011-01-17 ENCOUNTER — Other Ambulatory Visit: Payer: Self-pay | Admitting: Family Medicine

## 2011-01-17 NOTE — Telephone Encounter (Signed)
Refill request

## 2011-01-23 ENCOUNTER — Other Ambulatory Visit: Payer: Self-pay | Admitting: Family Medicine

## 2011-01-23 NOTE — Telephone Encounter (Signed)
Refill request

## 2011-02-12 ENCOUNTER — Other Ambulatory Visit: Payer: Self-pay | Admitting: Family Medicine

## 2011-02-13 NOTE — Telephone Encounter (Signed)
Refill request

## 2011-02-27 ENCOUNTER — Other Ambulatory Visit: Payer: Self-pay | Admitting: Family Medicine

## 2011-02-28 NOTE — Telephone Encounter (Signed)
Refill request

## 2011-04-07 ENCOUNTER — Other Ambulatory Visit: Payer: Self-pay | Admitting: Family Medicine

## 2011-04-07 NOTE — Telephone Encounter (Signed)
Refill request

## 2011-05-21 ENCOUNTER — Other Ambulatory Visit: Payer: Self-pay | Admitting: Family Medicine

## 2011-05-21 MED ORDER — ALLOPURINOL 300 MG PO TABS: 300.0000 mg | ORAL_TABLET | Freq: Every day | ORAL | Status: DC

## 2011-05-21 MED ORDER — METOPROLOL TARTRATE 100 MG PO TABS: 100.0000 mg | ORAL_TABLET | Freq: Two times a day (BID) | ORAL | Status: DC

## 2011-05-21 MED ORDER — OMEPRAZOLE 40 MG PO CPDR: 40.0000 mg | DELAYED_RELEASE_CAPSULE | Freq: Every day | ORAL | Status: DC

## 2011-07-18 ENCOUNTER — Telehealth: Payer: Self-pay | Admitting: Family Medicine

## 2011-07-18 NOTE — Telephone Encounter (Signed)
Patient has been called to go to [employer] in July, so they need a note to send to [employer] stating that he isn't able to do the [employer].  Please let the wife know when it is ready to be picked up.

## 2011-07-19 ENCOUNTER — Encounter: Payer: Self-pay | Admitting: Family Medicine

## 2011-07-19 NOTE — Telephone Encounter (Signed)
Letter completed and routed to admin team. Please contact pt.  Thank you.

## 2011-08-02 ENCOUNTER — Other Ambulatory Visit: Payer: Self-pay | Admitting: Family Medicine

## 2011-09-20 ENCOUNTER — Ambulatory Visit (INDEPENDENT_AMBULATORY_CARE_PROVIDER_SITE_OTHER): Payer: BC Managed Care – PPO | Admitting: Family Medicine

## 2011-09-20 ENCOUNTER — Encounter: Payer: Self-pay | Admitting: Family Medicine

## 2011-09-20 VITALS — BP 121/84 | HR 82 | Temp 98.1°F | Ht 72.0 in | Wt 239.0 lb

## 2011-09-20 DIAGNOSIS — F319 Bipolar disorder, unspecified: Secondary | ICD-10-CM

## 2011-09-20 DIAGNOSIS — K7290 Hepatic failure, unspecified without coma: Secondary | ICD-10-CM

## 2011-09-20 DIAGNOSIS — F1010 Alcohol abuse, uncomplicated: Secondary | ICD-10-CM

## 2011-09-20 DIAGNOSIS — I10 Essential (primary) hypertension: Secondary | ICD-10-CM

## 2011-09-20 DIAGNOSIS — F17200 Nicotine dependence, unspecified, uncomplicated: Secondary | ICD-10-CM

## 2011-09-20 LAB — COMPREHENSIVE METABOLIC PANEL
CO2: 21 mEq/L (ref 19–32)
Creat: 1.59 mg/dL — ABNORMAL HIGH (ref 0.50–1.35)
Glucose, Bld: 79 mg/dL (ref 70–99)
Total Bilirubin: 0.8 mg/dL (ref 0.3–1.2)
Total Protein: 7.9 g/dL (ref 6.0–8.3)

## 2011-09-20 LAB — LIPID PANEL
Cholesterol: 191 mg/dL (ref 0–200)
Total CHOL/HDL Ratio: 3.7 Ratio
Triglycerides: 140 mg/dL (ref <150)
VLDL: 28 mg/dL (ref 0–40)

## 2011-09-20 MED ORDER — SPIRONOLACTONE 50 MG PO TABS: 50.0000 mg | ORAL_TABLET | Freq: Every day | ORAL | Status: DC

## 2011-09-20 NOTE — Assessment & Plan Note (Signed)
Patient using less but not interested in quitting. Discussed that abstinence is preferable for him. Currently 2-3 beers 2 days per week.

## 2011-09-20 NOTE — Patient Instructions (Addendum)
Thanks for coming in today.  I like that you have slowed down on your drinking, it would be best if you stopped completely. Try to take Lactulose once a day.  Please come back in 2 months for follow up.

## 2011-09-20 NOTE — Assessment & Plan Note (Signed)
Patient open to quitting, no plans, states that he doesn't want to try to quit if his wife is still smoking. Offered support if his decision changes and suggested meeting with Dr. Raymondo Band if he/they want to in the future.

## 2011-09-20 NOTE — Progress Notes (Signed)
  Subjective:    Patient ID: [name] [name], male    DOB: [birth year], 51 y.o.   MRN: [id number]  HPI Patient here for follow up of chronic disease.  History of alcohol abuse and hepatic encephalopathy  Drinking 2-3 beers/day only on sat and Sunday now. States he is trying to cut back. Hasnt taken lactulose for 4-5 months. Compliant with other meds. No changes in mentation according to wife. No hematemesis, abdominal pain, melena/hematochezia.   Bipolar disorder: saw Psych on 09/03/11. Requested some labs  HTN: No cp/sob/HA, doesn't check it at home, reports high sodium diet.  Reports walking his dog daily during M-F  Smoking: Patient wants to quit but no plans of how yet. Also wants to wait for his wife to quit.    Review of Systems Constitutional: No weight change, fever, chills CV: No chest pain, edema Resp: No  cough, dyspnea  GI: No melena/hematochezia, abdominal pain, excessive heart burn GU: No dysuria, voiding/incontinence  Issues Neuro: No focal weakness MSK: No major joint swelling, no joint/muscle pains    Objective:   Physical Exam  Gen: NAD, alert, cooperative with exam HEENT: NCAT, EOMI, R beating persistent nystagmus  CV: RRR, no murmur Resp: CTABL, no wheezes, non-labored Abd: S/NT/ND, BS present, no guarding, liver edge palpable 4-5 cm below costal margin Ext: No edema noted  Neuro: Alert and oriented, strength 5/5 throughout. No Asterixis       Assessment & Plan:

## 2011-09-20 NOTE — Assessment & Plan Note (Signed)
Controlled today at 121/84. Denies red flags. Maintaining current regimen. Ordered labs: CMET, Lipid Panel. Reinforced the good that he's doing for himself with current exercise efforts.

## 2011-09-20 NOTE — Assessment & Plan Note (Addendum)
Psychiatrist seen on 09/03/2011, requested labs which I ordered: TSH, Li level, CMET. Compliant with meds.  PLan to fax results to [facility], [place], [phone]

## 2011-09-20 NOTE — Assessment & Plan Note (Signed)
No Astererixis or changes in mental status per wife. Has not been taking lactulose for 4-5 months. Bargained with patient to take it once daily.

## 2011-09-21 ENCOUNTER — Telehealth: Payer: Self-pay | Admitting: *Deleted

## 2011-09-21 ENCOUNTER — Telehealth: Payer: Self-pay | Admitting: Family Medicine

## 2011-09-21 ENCOUNTER — Other Ambulatory Visit: Payer: Self-pay | Admitting: Family Medicine

## 2011-09-21 DIAGNOSIS — K7290 Hepatic failure, unspecified without coma: Secondary | ICD-10-CM

## 2011-09-21 DIAGNOSIS — I10 Essential (primary) hypertension: Secondary | ICD-10-CM

## 2011-09-21 NOTE — Progress Notes (Signed)
Pt will schedule lab appt.

## 2011-09-21 NOTE — Telephone Encounter (Signed)
Called pt. He will schedule lab visit in 2-3 weeks. [name] [name], [name] [name]

## 2011-09-21 NOTE — Telephone Encounter (Signed)
Spoke to [name] [name] personally about his recent lab work. Discussed use of NSAIDs and need for adequate hydration in light of his kidney function decrease. Patient agreed to return to clinic in 2 weeks for a repeat CMET.

## 2011-10-03 ENCOUNTER — Other Ambulatory Visit: Payer: BC Managed Care – PPO

## 2011-10-03 DIAGNOSIS — I10 Essential (primary) hypertension: Secondary | ICD-10-CM

## 2011-10-03 DIAGNOSIS — K7290 Hepatic failure, unspecified without coma: Secondary | ICD-10-CM

## 2011-10-03 LAB — BASIC METABOLIC PANEL
CO2: 20 mEq/L (ref 19–32)
Glucose, Bld: 88 mg/dL (ref 70–99)
Potassium: 4.8 mEq/L (ref 3.5–5.3)
Sodium: 137 mEq/L (ref 135–145)

## 2011-10-03 NOTE — Progress Notes (Signed)
BMP DONE TODAY Danner Paulding 

## 2011-10-04 ENCOUNTER — Encounter: Payer: Self-pay | Admitting: Family Medicine

## 2011-10-26 ENCOUNTER — Ambulatory Visit: Payer: BC Managed Care – PPO | Admitting: Family Medicine

## 2011-11-21 ENCOUNTER — Other Ambulatory Visit: Payer: Self-pay | Admitting: Family Medicine

## 2011-12-20 ENCOUNTER — Ambulatory Visit: Payer: BC Managed Care – PPO | Admitting: Family Medicine

## 2011-12-25 ENCOUNTER — Ambulatory Visit (INDEPENDENT_AMBULATORY_CARE_PROVIDER_SITE_OTHER): Payer: BC Managed Care – PPO | Admitting: Family Medicine

## 2011-12-25 ENCOUNTER — Encounter: Payer: Self-pay | Admitting: Family Medicine

## 2011-12-25 VITALS — BP 130/92 | HR 58 | Temp 98.5°F | Ht 72.0 in | Wt 246.0 lb

## 2011-12-25 DIAGNOSIS — Z23 Encounter for immunization: Secondary | ICD-10-CM

## 2011-12-25 DIAGNOSIS — F1010 Alcohol abuse, uncomplicated: Secondary | ICD-10-CM

## 2011-12-25 DIAGNOSIS — B354 Tinea corporis: Secondary | ICD-10-CM

## 2011-12-25 DIAGNOSIS — I10 Essential (primary) hypertension: Secondary | ICD-10-CM

## 2011-12-25 DIAGNOSIS — K7290 Hepatic failure, unspecified without coma: Secondary | ICD-10-CM

## 2011-12-25 MED ORDER — TERBINAFINE HCL 1 % EX CREA: TOPICAL_CREAM | Freq: Two times a day (BID) | CUTANEOUS | Status: DC

## 2011-12-25 MED ORDER — SPIRONOLACTONE 50 MG PO TABS: 50.0000 mg | ORAL_TABLET | Freq: Every day | ORAL | Status: DC

## 2011-12-25 NOTE — Progress Notes (Signed)
  Subjective:    Patient ID: [name] [name], male    DOB: 06/02/1960, 51 y.o.   MRN: [id number]  HPI Pt here for f/u chronic medical condition and itchy rash  Rash: Itchy, not painful or bleeding, for 2-3 weeks. Thinks it could be riong worm because he sleeps with his dog. No fevers, chills, sweats.   HTN: 131/92 today, taking meds consistently, no headaches, chest pain, dyspnea, blurred vision, or palpitations  Alcohol abuse: Still drinking 1/5th of gin per week and 2-3 beers a day. Not really wanting to quit, has cut back significantly from what he used to drink.    Review of Systems  Constitutional: Negative for fever, chills, weight loss and diaphoresis.  HENT: Negative for congestion and sore throat.   Eyes: Negative for blurred vision.  Respiratory: Negative for shortness of breath and wheezing.   Cardiovascular: Negative for chest pain, palpitations and leg swelling.  Gastrointestinal: Negative for nausea, vomiting and diarrhea.  Musculoskeletal: Negative for myalgias and joint pain.  Skin: Positive for itching and rash.  Neurological: Negative for dizziness, seizures and headaches.  Psychiatric/Behavioral: Positive for substance abuse.    Review of Systems  Constitutional: Negative for fever, chills, weight loss and diaphoresis.  HENT: Negative for congestion and sore throat.   Eyes: Negative for blurred vision.  Respiratory: Negative for shortness of breath and wheezing.   Cardiovascular: Negative for chest pain, palpitations and leg swelling.  Gastrointestinal: Negative for nausea, vomiting and diarrhea.  Musculoskeletal: Negative for myalgias and joint pain.  Skin: Positive for itching and rash.  Neurological: Negative for dizziness, seizures and headaches.  Psychiatric/Behavioral: Positive for substance abuse.        Objective:   Physical Exam  Gen: NAD, alert, cooperative with exam HEENT: NCAT, EOMI, PERRL, no scleral icteris, oral mucosa pink and moist CV:  RRR, no murmur, good S1/S2 Resp: CTABL, no wheezes, non-labored,  Abd: SNTND, BS present, no guarding or organomegaly Ext: No edema, no injury Neuro: Alert and oriented, No gross deficits Skin: approx 4 by 1 cm erythemetous raised area on middle back. approx 6 cm in diameter less erythemetous flat lesion with other area on its superior border.        Assessment & Plan:

## 2011-12-25 NOTE — Patient Instructions (Signed)
Thanks for coming in today!  I have sent a prescription for terbinifine cream for your rash.  Take your lactulose 2 times per week like we discussed.  If you have any questions call us.   Please come back to see me in 2 to 3 months for follow up.

## 2011-12-25 NOTE — Assessment & Plan Note (Signed)
Stable and continued.  Not wanting to quit currently. Advised not to quit cold [place] but encouraged to decrease by 1 beer per day for now.  Continue to monitor

## 2011-12-25 NOTE — Assessment & Plan Note (Signed)
Controlled. 131/92 today. Not checking at home. No red flags.   Continue current treatment F/u in 2-3 months.

## 2011-12-25 NOTE — Assessment & Plan Note (Signed)
New onset for last 2-3 weeks. No sign of systemic involvement. With his hepatic history I will treat topically with lamisil cream OTC. Advised BID for 2-3 weeks.

## 2011-12-25 NOTE — Assessment & Plan Note (Signed)
No signs of hyperammonemia or changes in mental status per wife. Not taking lactulose. Advised to take 2 times per week and advised wife on what to look out for for problems and when to return to clinic.  Continue to monitor.

## 2012-03-18 ENCOUNTER — Other Ambulatory Visit: Payer: Self-pay | Admitting: *Deleted

## 2012-03-18 MED ORDER — METOPROLOL TARTRATE 100 MG PO TABS: 100.0000 mg | ORAL_TABLET | Freq: Two times a day (BID) | ORAL | Status: DC

## 2012-03-18 MED ORDER — ALLOPURINOL 300 MG PO TABS: 300.0000 mg | ORAL_TABLET | Freq: Every day | ORAL | Status: DC

## 2012-04-28 ENCOUNTER — Other Ambulatory Visit: Payer: Self-pay | Admitting: *Deleted

## 2012-04-28 MED ORDER — OMEGA-3-ACID ETHYL ESTERS 1 G PO CAPS: ORAL_CAPSULE | ORAL | Status: DC

## 2012-04-29 ENCOUNTER — Other Ambulatory Visit: Payer: Self-pay | Admitting: *Deleted

## 2012-04-29 MED ORDER — FOLIC ACID 1 MG PO TABS: ORAL_TABLET | ORAL | Status: DC

## 2012-05-07 ENCOUNTER — Ambulatory Visit (INDEPENDENT_AMBULATORY_CARE_PROVIDER_SITE_OTHER): Payer: BC Managed Care – PPO | Admitting: Family Medicine

## 2012-05-07 ENCOUNTER — Encounter: Payer: Self-pay | Admitting: Family Medicine

## 2012-05-07 VITALS — BP 128/91 | HR 66 | Ht 72.0 in | Wt 251.0 lb

## 2012-05-07 DIAGNOSIS — F1020 Alcohol dependence, uncomplicated: Secondary | ICD-10-CM

## 2012-05-07 DIAGNOSIS — K7290 Hepatic failure, unspecified without coma: Secondary | ICD-10-CM

## 2012-05-07 DIAGNOSIS — I10 Essential (primary) hypertension: Secondary | ICD-10-CM

## 2012-05-07 DIAGNOSIS — B354 Tinea corporis: Secondary | ICD-10-CM

## 2012-05-07 NOTE — Patient Instructions (Addendum)
Thanks for coming in today!, it was great to see you.  Come back to see me in 3 months  Hypertension As your heart beats, it forces blood through your arteries. This force is your blood pressure. If the pressure is too high, it is called hypertension (HTN) or high blood pressure. HTN is dangerous because you may have it and not know it. High blood pressure may mean that your heart has to work harder to pump blood. Your arteries may be narrow or stiff. The extra work puts you at risk for heart disease, stroke, and other problems.  Blood pressure consists of two numbers, a higher number over a lower, 110/72, for example. It is stated as "110 over 72." The ideal is below 120 for the top number (systolic) and under 80 for the bottom (diastolic). Write down your blood pressure today. You should pay close attention to your blood pressure if you have certain conditions such as:  Heart failure.  Prior heart attack.  Diabetes  Chronic kidney disease.  Prior stroke.  Multiple risk factors for heart disease. To see if you have HTN, your blood pressure should be measured while you are seated with your arm held at the level of the heart. It should be measured at least twice. A one-time elevated blood pressure reading (especially in the Emergency Department) does not mean that you need treatment. There may be conditions in which the blood pressure is different between your right and left arms. It is important to see your caregiver soon for a recheck. Most people have essential hypertension which means that there is not a specific cause. This type of high blood pressure may be lowered by changing lifestyle factors such as:  Stress.  Smoking.  Lack of exercise.  Excessive weight.  Drug/tobacco/alcohol use.  Eating less salt. Most people do not have symptoms from high blood pressure until it has caused damage to the body. Effective treatment can often prevent, delay or reduce that damage. TREATMENT    When a cause has been identified, treatment for high blood pressure is directed at the cause. There are a large number of medications to treat HTN. These fall into several categories, and your caregiver will help you select the medicines that are best for you. Medications may have side effects. You should review side effects with your caregiver. If your blood pressure stays high after you have made lifestyle changes or started on medicines,   Your medication(s) may need to be changed.  Other problems may need to be addressed.  Be certain you understand your prescriptions, and know how and when to take your medicine.  Be sure to follow up with your caregiver within the time frame advised (usually within two weeks) to have your blood pressure rechecked and to review your medications.  If you are taking more than one medicine to lower your blood pressure, make sure you know how and at what times they should be taken. Taking two medicines at the same time can result in blood pressure that is too low. SEEK IMMEDIATE MEDICAL CARE IF:  You develop a severe headache, blurred or changing vision, or confusion.  You have unusual weakness or numbness, or a faint feeling.  You have severe chest or abdominal pain, vomiting, or breathing problems. MAKE SURE YOU:   Understand these instructions.  Will watch your condition.  Will get help right away if you are not doing well or get worse. Document Released: 02/12/2005 Document Revised: 05/07/2011 Document Reviewed: 10/03/2007  [employer] Patient Information 2013 [employer].  

## 2012-05-07 NOTE — Assessment & Plan Note (Signed)
Resolving, uised antifungal intermittently Still some residual rash, some itching  Encouraged 2 more weeks of steady use, RTC if not resolved

## 2012-05-07 NOTE — Assessment & Plan Note (Signed)
No clonus/hyper-reflexia today, wife feels like he walks less well and has poorer memory when he does not take lactulose but responds very well when he does He will not take lactiulose daily or even every-other day because of the subsequent diarrhea Advised patient to take lactulose every other day and to reduce the amount of alcohol he consumes.  Advised patient's wife to monitor mental status and seek help if he worsens and has problems walking/talking/or with alertness.

## 2012-05-07 NOTE — Assessment & Plan Note (Signed)
Still drinking the same amount to more.  Again I re-emphasized that he already has and is doing his body harm Reminded him noit to stop cold [place] Bargained to reduce amount by 1-2 beers daily

## 2012-05-07 NOTE — Assessment & Plan Note (Signed)
Controlled, 128/91, not checking at home still. no red flags  Continue spironolactone, amlodipine, metoprolol,

## 2012-05-07 NOTE — Progress Notes (Addendum)
  Subjective:    Patient ID: [name] [name], male    DOB: 1960/09/26, 52 y.o.   MRN: [id number]  HPI Pt here for f/u of chronic medical problem  HTN: taking meds consistently, no HA/CP/dyspnea/palpitations, 128/91 today  Hepatic encephalopathy/alcoholism Still drinking 1/5th gin per week + 3-4 beers, not open to quitting, knows he's doing himself harm Wife states he walks better and has a better memory when he takes lasctulose- he takes it 2-3 times per week.   Rash - improved with lamisil, used it intermittently for 1-2 weeks, not for about 1 month. Itchy occasionally, not bleeding anymore  Smoking- considering stopping, 1/2 ppd since 52 y/o    Review of Systems per hpi     Objective:   Physical Exam  Gen: NAD, alert, cooperative with exam, speech slightly slurred HEENT: NCAT, MMM CV: RRR, good S1/S2, no murmur Resp: CTABL, no wheezes, non-labored Abd: SNTND, BS present, no guarding or organomegaly Ext: No edema Neuro: Alert and oriented, No gross deficits, 1-2 + DTRs, no clonus      Assessment & Plan:

## 2012-09-11 ENCOUNTER — Other Ambulatory Visit: Payer: Self-pay | Admitting: Family Medicine

## 2012-09-30 ENCOUNTER — Ambulatory Visit (INDEPENDENT_AMBULATORY_CARE_PROVIDER_SITE_OTHER): Payer: BC Managed Care – PPO | Admitting: Family Medicine

## 2012-09-30 ENCOUNTER — Encounter: Payer: Self-pay | Admitting: Family Medicine

## 2012-09-30 VITALS — BP 138/96 | HR 80 | Ht 72.0 in | Wt 243.8 lb

## 2012-09-30 DIAGNOSIS — F319 Bipolar disorder, unspecified: Secondary | ICD-10-CM

## 2012-09-30 DIAGNOSIS — K7290 Hepatic failure, unspecified without coma: Secondary | ICD-10-CM

## 2012-09-30 DIAGNOSIS — B354 Tinea corporis: Secondary | ICD-10-CM

## 2012-09-30 DIAGNOSIS — F1020 Alcohol dependence, uncomplicated: Secondary | ICD-10-CM

## 2012-09-30 DIAGNOSIS — I10 Essential (primary) hypertension: Secondary | ICD-10-CM

## 2012-09-30 DIAGNOSIS — K7682 Hepatic encephalopathy: Secondary | ICD-10-CM

## 2012-09-30 NOTE — Assessment & Plan Note (Signed)
No symptoms today. His wife continues to feel like he thinks more clearly and walks better when he is taking his lactulose. He is now taking lactulose daily, encouraged to continue.   old Ultrasound with hepatic steatosis CMP, PT/INR today Followup 3 months.

## 2012-09-30 NOTE — Assessment & Plan Note (Signed)
Discussed the risk of him developing cirrhosis given that he is Arty had liver disease related encephalopathy in the past. Not interested in quitting, but seems to have decreased his beer intake by 2-3 beers daily since our last visit. Bargained to try and a bottle of gin last 2 weeks rather than 1.

## 2012-09-30 NOTE — Assessment & Plan Note (Signed)
Slightly elevated today with a diastolic of 96 Still not checking at home, no red flags Continue spironolactone, amlodipine, and metoprolol Would add HCTZ next if he begins to keep track of his blood pressures I can tell his diastolic is always elevated.

## 2012-09-30 NOTE — Assessment & Plan Note (Signed)
Follows with outpatient psychiatry Compliant with lithium We'll check TSH, lithium level, and CMP her psychiatrist request  Fax results to [phone]

## 2012-09-30 NOTE — Progress Notes (Signed)
  Subjective:    Patient ID: [name] [name], male    DOB: May 31, 1960, 52 y.o.   MRN: [id number]  HPI  Here for followup of chronic medical problems  Hypertension Takes his meds regularly. Denies headaches, chest pain, dyspnea and palpitations. Does not watch his salt intake.  Alcohol abuse Drinking approximately one fifth of gin weekly, and approximately 1-2 beers per day Does not desire to quit.  Hepatic encephalopathy Feels that he is thinking clearly and normally. Does not like taking lactulose but does take it approximately once daily in with his wife administers it.  Rash Previous rash resolved on his back with the use of Lamisil. Now has a rash on his chest which started approximately 1 and 1/2 weeks ago  Tobacco abuse This note to approximately 1/2 pack a day for approximately 40 years.  Does not want to quit.   Review of Systems Per hPI     Objective:   Physical Exam  Gen: NAD, alert, cooperative with exam HEENT: NCAT, EOMI CV: RRR, good S1/S2, no murmur Resp: CTABL, no wheezes, non-labored Abd: SNTND, BS present, no guarding Ext: No edema, warm Neuro: Alert and oriented, No gross deficits, no asterixis Skin : Approximately 3 cm x 5 cm erythematous raised macule on his left chest, also at approximately 5 cm erythematous ring lateral to the other lesion.      Assessment & Plan:

## 2012-09-30 NOTE — Patient Instructions (Signed)
Come back 3 months  Try to cut back on your salt intake  Cirrhosis Cirrhosis is a condition of scarring of the liver which is caused when the liver has tried repairing itself following damage. This damage may come from a previous infection such as one of the forms of hepatitis (usually hepatitis C), or the damage may come from being injured by toxins. The main toxin that causes this damage is alcohol. The scarring of the liver from use of alcohol is irreversible. That means the liver cannot return to normal even though alcohol is not used any more. The main danger of hepatitis C infection is that it may cause long-lasting (chronic) liver disease, and this also may lead to cirrhosis. This complication is progressive and irreversible. CAUSES  Prior to available blood tests, hepatitis C could be contracted by blood transfusions. Since testing of blood has improved, this is now unlikely. This infection can also be contracted through intravenous drug use and the sharing of needles. It can also be contracted through sexual relationships. The injury caused by alcohol comes from too much use. It is not a few drinks that poison the liver, but years of misuse. Usually there will be some signs and symptoms early with scarring of the liver that suggest the development of better habits. Alcohol should never be used while using acetaminophen. A small dose of both taken together may cause irreversible damage to the liver. HOME CARE INSTRUCTIONS  There is no specific treatment for cirrhosis. However, there are things you can do to avoid making the condition worse.  Rest as needed.  Eat a well-balanced diet. Your caregiver can help you with suggestions.  Vitamin supplements including vitamins A, K, D, and thiamine can help.  A low-salt diet, water restriction, or diuretic medicine may be needed to reduce fluid retention.  Avoid alcohol. This can be extremely toxic if combined with acetaminophen.  Avoid drugs  which are toxic to the liver. Some of these include isoniazid, methyldopa, acetaminophen, anabolic steroids (muscle-building drugs), erythromycin, and oral contraceptives (birth control pills). Check with your caregiver to make sure medicines you are presently taking will not be harmful.  Periodic blood tests may be required. Follow your caregiver's advice regarding the timing of these.  Milk thistle is an herbal remedy which does protect the liver against toxins. However, it will not help once the liver has been scarred. SEEK MEDICAL CARE IF:  You have increasing fatigue or weakness.  You develop swelling of the hands, feet, legs, or face.  You vomit bright red blood, or a coffee ground appearing material.  You have blood in your stools, or the stools turn black and tarry.  You have a fever.  You develop loss of appetite, or have nausea and vomiting.  You develop jaundice.  You develop easy bruising or bleeding.  You have worsening of any of the problems you are concerned about. Document Released: 02/12/2005 Document Revised: 05/07/2011 Document Reviewed: 10/01/2007 [facility] Patient Information 2014 [place], [place].

## 2012-09-30 NOTE — Assessment & Plan Note (Signed)
Previous rash has resolved The rash for approximately one week duration, as a researcher in Lamisil Cream Encouraged continuing Lamisil Cream

## 2012-10-01 ENCOUNTER — Telehealth: Payer: Self-pay | Admitting: Family Medicine

## 2012-10-01 LAB — CBC WITH DIFFERENTIAL/PLATELET
Basophils Absolute: 0.1 10*3/uL (ref 0.0–0.1)
Lymphocytes Relative: 20 % (ref 12–46)
Lymphs Abs: 2.5 10*3/uL (ref 0.7–4.0)
MCV: 100 fL (ref 78.0–100.0)
Neutro Abs: 8.4 10*3/uL — ABNORMAL HIGH (ref 1.7–7.7)
Neutrophils Relative %: 66 % (ref 43–77)
Platelets: 269 10*3/uL (ref 150–400)
RBC: 4.3 MIL/uL (ref 4.22–5.81)
WBC: 12.6 10*3/uL — ABNORMAL HIGH (ref 4.0–10.5)

## 2012-10-01 LAB — COMPREHENSIVE METABOLIC PANEL
CO2: 20 mEq/L (ref 19–32)
Calcium: 9.7 mg/dL (ref 8.4–10.5)
Chloride: 107 mEq/L (ref 96–112)
Glucose, Bld: 117 mg/dL — ABNORMAL HIGH (ref 70–99)
Sodium: 137 mEq/L (ref 135–145)
Total Bilirubin: 0.5 mg/dL (ref 0.3–1.2)
Total Protein: 7.4 g/dL (ref 6.0–8.3)

## 2012-10-01 LAB — PROTIME-INR: Prothrombin Time: 13 seconds (ref 11.6–15.2)

## 2012-10-01 LAB — LITHIUM LEVEL: Lithium Lvl: 0.5 mEq/L — ABNORMAL LOW (ref 0.80–1.40)

## 2012-10-01 NOTE — Telephone Encounter (Signed)
Called to discuss Labs.   All WNL except for a low lithium level. Will fax these results to Dr. Donell Beers.   [name], MD [facility] Resident, PGY-2 10/01/2012, 12:30 PM

## 2012-10-07 ENCOUNTER — Telehealth: Payer: Self-pay | Admitting: Family Medicine

## 2012-10-07 DIAGNOSIS — K7290 Hepatic failure, unspecified without coma: Secondary | ICD-10-CM

## 2012-10-07 NOTE — Telephone Encounter (Signed)
Will fwd to Md.  Crislyn Willbanks L, CMA  

## 2012-10-07 NOTE — Telephone Encounter (Signed)
Pt is requesting a refill for lactulose be sent to his pharmacy [facility] [address]. There are no refills left and the pharmacy will not refill this since original prescription was 9/12. JW

## 2012-10-09 MED ORDER — LACTULOSE 10 GM/15ML PO SOLN: 30.0000 g | Freq: Every day | ORAL | Status: DC

## 2012-10-09 NOTE — Telephone Encounter (Signed)
Refilled Lactulose, discussed at last visit.   [name], MD [facility] Resident, PGY-2 10/09/2012, 8:30 AM

## 2013-01-16 ENCOUNTER — Other Ambulatory Visit: Payer: Self-pay | Admitting: Family Medicine

## 2013-03-13 ENCOUNTER — Other Ambulatory Visit: Payer: Self-pay | Admitting: Family Medicine

## 2013-03-31 ENCOUNTER — Encounter: Payer: Self-pay | Admitting: Family Medicine

## 2013-03-31 ENCOUNTER — Ambulatory Visit (INDEPENDENT_AMBULATORY_CARE_PROVIDER_SITE_OTHER): Payer: BC Managed Care – PPO | Admitting: Family Medicine

## 2013-03-31 VITALS — BP 131/97 | HR 65 | Temp 98.0°F | Resp 16 | Wt 241.0 lb

## 2013-03-31 DIAGNOSIS — Z23 Encounter for immunization: Secondary | ICD-10-CM

## 2013-03-31 DIAGNOSIS — F1020 Alcohol dependence, uncomplicated: Secondary | ICD-10-CM

## 2013-03-31 DIAGNOSIS — I10 Essential (primary) hypertension: Secondary | ICD-10-CM

## 2013-03-31 DIAGNOSIS — F319 Bipolar disorder, unspecified: Secondary | ICD-10-CM

## 2013-03-31 DIAGNOSIS — F17200 Nicotine dependence, unspecified, uncomplicated: Secondary | ICD-10-CM

## 2013-03-31 DIAGNOSIS — K7290 Hepatic failure, unspecified without coma: Secondary | ICD-10-CM

## 2013-03-31 DIAGNOSIS — Z0000 Encounter for general adult medical examination without abnormal findings: Secondary | ICD-10-CM

## 2013-03-31 DIAGNOSIS — K7682 Hepatic encephalopathy: Secondary | ICD-10-CM

## 2013-03-31 LAB — COMPREHENSIVE METABOLIC PANEL
ALBUMIN: 4.2 g/dL (ref 3.5–5.2)
ALK PHOS: 76 U/L (ref 39–117)
ALT: 41 U/L (ref 0–53)
AST: 38 U/L — ABNORMAL HIGH (ref 0–37)
BUN: 24 mg/dL — AB (ref 6–23)
CALCIUM: 9.6 mg/dL (ref 8.4–10.5)
CHLORIDE: 102 meq/L (ref 96–112)
CO2: 21 meq/L (ref 19–32)
Creat: 1.43 mg/dL — ABNORMAL HIGH (ref 0.50–1.35)
GLUCOSE: 88 mg/dL (ref 70–99)
POTASSIUM: 5.3 meq/L (ref 3.5–5.3)
SODIUM: 133 meq/L — AB (ref 135–145)
TOTAL PROTEIN: 7.9 g/dL (ref 6.0–8.3)
Total Bilirubin: 0.7 mg/dL (ref 0.2–1.2)

## 2013-03-31 LAB — CBC WITH DIFFERENTIAL/PLATELET
Basophils Absolute: 0 10*3/uL (ref 0.0–0.1)
Basophils Relative: 0 % (ref 0–1)
EOS ABS: 0.2 10*3/uL (ref 0.0–0.7)
Eosinophils Relative: 2 % (ref 0–5)
HCT: 45.8 % (ref 39.0–52.0)
Hemoglobin: 15.7 g/dL (ref 13.0–17.0)
LYMPHS PCT: 24 % (ref 12–46)
Lymphs Abs: 3.1 10*3/uL (ref 0.7–4.0)
MCH: 34.4 pg — ABNORMAL HIGH (ref 26.0–34.0)
MCHC: 34.3 g/dL (ref 30.0–36.0)
MCV: 100.2 fL — ABNORMAL HIGH (ref 78.0–100.0)
Monocytes Absolute: 1.2 10*3/uL — ABNORMAL HIGH (ref 0.1–1.0)
Monocytes Relative: 9 % (ref 3–12)
NEUTROS PCT: 65 % (ref 43–77)
Neutro Abs: 8.4 10*3/uL — ABNORMAL HIGH (ref 1.7–7.7)
PLATELETS: 272 10*3/uL (ref 150–400)
RBC: 4.57 MIL/uL (ref 4.22–5.81)
RDW: 13.1 % (ref 11.5–15.5)
WBC: 12.9 10*3/uL — ABNORMAL HIGH (ref 4.0–10.5)

## 2013-03-31 NOTE — Assessment & Plan Note (Signed)
Still not ready to quit Offered support and advised cessation

## 2013-03-31 NOTE — Assessment & Plan Note (Signed)
His uses unchanged from previous He certainly is at more risk for developing cirrhosis given his previous hepatic steatosis and hepatic encephalopathy Encouraged cutting back

## 2013-03-31 NOTE — Assessment & Plan Note (Signed)
At baseline, follows with outpatient psychiatry, will check lithium and TSH level per his psychiatrist request

## 2013-03-31 NOTE — Assessment & Plan Note (Addendum)
Still has not had colonoscopy, encouraged Flu shot given today

## 2013-03-31 NOTE — Assessment & Plan Note (Signed)
Again slightly elevated with diastolic of 97 Still not checking at home, no red flags Continue current medications spurlike tone, amlodipine, metoprolol Encouraged checking blood pressures when they are at the drugstore and bringing them back Followup 6 months per their preference

## 2013-03-31 NOTE — Progress Notes (Addendum)
Patient ID: [name] [name], male   DOB: 06/08/1960, 53 y.o.   MRN: [id number]  Kevin FentonSamuel Bradshaw, MD Phone: [phone]  Subjective:  Chief complaint-noted  Patient here for followup of chronic medical conditions  Hypertension Takes his medications regularly, denies headache, chest pain, dyspnea, edema, and palpitations Walks approximately 1 hour daily while walking his dog Currently smoking- not interested in quitting, half pack per day for approximately 40 years  Alcohol abuse Drinks approximately one fifth of gin weekly in about 2-3 beers daily  Hepatic encephalopathy From likely alcoholic cirrhosis, however her last set of labs were largely unremarkable, 2012 ultrasound with hepatic steatosis Takes his lactulose daily, his wife encourages taking it more often whenever he has changes in his mental status  Bipolar disorder Seizures psychiatrist approximately every 3 months Feels that he is at baseline currently   632  Patient Active Problem List   Diagnosis Date Noted  . Tinea corporis 12/25/2011  . Hepatic encephalopathy 09/03/2010  . Gastric ulcer 08/28/2010  . Hepatic steatosis 06/09/2010  . Bipolar disorder 05/14/2010  . Hepatomegaly 05/12/2010  . ANEMIA, FOLIC ACID DEFICIENCY 08/02/2009  . LEG PAIN, BILATERAL 07/28/2009  . ABNORMALITY OF GAIT 07/28/2009  . Alcoholism 07/11/2009  . WEIGHT LOSS, RECENT 07/11/2009  . ABDOMINAL PAIN OTHER SPECIFIED SITE 07/11/2009  . TRANSAMINASES, SERUM, ELEVATED 09/09/2008  . PROTEINURIA 08/07/2006  . HYPERTRIGLYCERIDEMIA 04/25/2006  . GOUT, CHRONIC 04/25/2006  . OBESITY, NOS 04/25/2006  . DEPRESSION, MAJOR, RECURRENT 04/25/2006  . TOBACCO DEPENDENCE 04/25/2006  . HYPERTENSION, BENIGN SYSTEMIC 04/25/2006  . PEPTIC ULCER DIS., UNSPEC. W/O OBSTRUCTION 04/25/2006  . IMPOTENCE, ORGANIC 04/25/2006    Medications- reviewed and updated Current Outpatient Prescriptions  Medication Sig Dispense Refill  . allopurinol (ZYLOPRIM) 300  MG tablet TAKE 1 TABLET (300 MG TOTAL) BY MOUTH DAILY.  30 tablet  6  . amLODipine (NORVASC) 10 MG tablet Take 1 tablet (10 mg total) by mouth daily.  30 tablet  6  . aspirin 81 MG chewable tablet Chew 1 tablet (81 mg total) by mouth daily.  30 tablet  6  . FLUoxetine (PROZAC) 40 MG capsule Take 1 capsule (40 mg total) by mouth daily.  30 capsule  6  . folic acid (FOLVITE) 1 MG tablet TAKE 1 TABLET BY MOUTH EVERY DAY  30 tablet  6  . lactulose (CHRONULAC) 10 GM/15ML solution Take 45 mL (30 g total) by mouth daily.  1892 mL  11  . lithium (ESKALITH) 450 MG CR tablet Take 1 tablet (450 mg total) by mouth daily. For mental health  30 tablet  6  . metoprolol (LOPRESSOR) 100 MG tablet TAKE 1 TABLET (100 MG TOTAL) BY MOUTH 2 (TWO) TIMES DAILY.  60 tablet  6  . Multiple Vitamin (MULTIVITAMIN) capsule Take 1 capsule by mouth daily.  30 capsule  6  . omega-3 acid ethyl esters (LOVAZA) 1 G capsule TAKE 1 CAPSULES BY MOUTH TWICE A DAY  60 capsule  6  . omeprazole (PRILOSEC) 40 MG capsule Take 1 capsule (40 mg total) by mouth daily.  60 capsule  6  . spironolactone (ALDACTONE) 50 MG tablet TAKE 1 TABLET (50 MG TOTAL) BY MOUTH DAILY.  30 tablet  5  . terbinafine (LAMISIL) 1 % cream Apply topically 2 (two) times daily. To affected areas for 2 to 3 weeks.  30 g  0  . Thiamine HCl (VITAMIN B-1) 100 MG tablet Take 1 tablet (100 mg total) by mouth daily.  30 tablet  6  No current facility-administered medications for this visit.    Objective: BP 131/97  Pulse 65  Temp(Src) 98 F (36.7 C) (Oral)  Resp 16  Wt 241 lb (109.317 kg)  SpO2 98% Gen: NAD, alert, cooperative with exam HEENT: NCAT CV: RRR, good S1/S2, no murmur Resp: CTABL, no wheezes, non-labored Abd: SNTND, BS present, no guarding or organomegaly Ext: No edema, warm Neuro: Alert and oriented, no asterixis, strength 5/5 in bilateral upper and lower extremity   Assessment/Plan:  Alcoholism His uses unchanged from previous He certainly is  at more risk for developing cirrhosis given his previous hepatic steatosis and hepatic encephalopathy Encouraged cutting back  Bipolar disorder At baseline, follows with outpatient psychiatry, will check lithium and TSH level per his psychiatrist request  Hepatic encephalopathy At baseline mentation today, no evidence of overt cirrhosis but does have hepatic steatosis Wife mentions some variance in mentation occasionally which is responsive to lactulose Previous liver labs within normal limits Continue lactulose as they see benefit.  HYPERTENSION, BENIGN SYSTEMIC Again slightly elevated with diastolic of 97 Still not checking at home, no red flags Continue current medications spurlike tone, amlodipine, metoprolol Encouraged checking blood pressures when they are at the drugstore and bringing them back Followup 6 months per their preference  TOBACCO DEPENDENCE Still not ready to quit Offered support and advised cessation    Orders Placed This Encounter  Procedures  . Lithium level  . TSH  . Comprehensive metabolic panel  . CBC with Differential

## 2013-03-31 NOTE — Patient Instructions (Signed)
Come back in 6 months  I will send your labs to your other doctor\  Try to watch your salt intake

## 2013-03-31 NOTE — Assessment & Plan Note (Signed)
At baseline mentation today, no evidence of overt cirrhosis but does have hepatic steatosis Wife mentions some variance in mentation occasionally which is responsive to lactulose Previous liver labs within normal limits Continue lactulose as they see benefit.

## 2013-04-01 LAB — LITHIUM LEVEL: Lithium Lvl: 0.5 mEq/L — ABNORMAL LOW (ref 0.80–1.40)

## 2013-04-01 LAB — TSH: TSH: 3.117 u[IU]/mL (ref 0.350–4.500)

## 2013-04-02 ENCOUNTER — Telehealth: Payer: Self-pay | Admitting: Family Medicine

## 2013-04-02 NOTE — Telephone Encounter (Signed)
Done.  Marielis Samara L, CMA  

## 2013-04-02 NOTE — Telephone Encounter (Signed)
Called to follow up on labs  Mild hyponatremia- maybe lithium related SIADH, monitor for now Lithium level borderline- send to psych AST slightly up- no intervention Cre 1.4 is up slightly, recommended good hydration and avoiding NSAIDs.   Will ask staff to forward to psychiatrist.   Murtis SinkSam Bradshaw, MD [facility] Resident, PGY-2 04/02/2013, 11:56 AM

## 2013-06-25 ENCOUNTER — Other Ambulatory Visit: Payer: Self-pay | Admitting: Family Medicine

## 2013-06-26 ENCOUNTER — Other Ambulatory Visit: Payer: Self-pay | Admitting: Family Medicine

## 2013-07-07 ENCOUNTER — Telehealth: Payer: Self-pay | Admitting: Family Medicine

## 2013-07-07 DIAGNOSIS — I10 Essential (primary) hypertension: Secondary | ICD-10-CM

## 2013-07-07 NOTE — Telephone Encounter (Signed)
Left message on patient voicemail for patient to call back to schedule a lab appointment.

## 2013-07-07 NOTE — Telephone Encounter (Signed)
Pt with K of 5.3 on last CMP and recent refill request for spirono. I think he should probably have another BMP drawn to monitor his K.   I will ask nursing staff to call him and ask him to make a lab appt. Will order future BMP.   Murtis SinkSam Tarisa Paola, MD [facility] Resident, PGY-2 07/07/2013, 12:53 PM

## 2013-07-10 ENCOUNTER — Other Ambulatory Visit: Payer: BC Managed Care – PPO

## 2013-07-10 DIAGNOSIS — I10 Essential (primary) hypertension: Secondary | ICD-10-CM

## 2013-07-10 LAB — BASIC METABOLIC PANEL
BUN: 17 mg/dL (ref 6–23)
CALCIUM: 9.5 mg/dL (ref 8.4–10.5)
CHLORIDE: 107 meq/L (ref 96–112)
CO2: 19 meq/L (ref 19–32)
Creat: 1.24 mg/dL (ref 0.50–1.35)
Glucose, Bld: 105 mg/dL — ABNORMAL HIGH (ref 70–99)
Potassium: 4.5 mEq/L (ref 3.5–5.3)
SODIUM: 137 meq/L (ref 135–145)

## 2013-07-10 NOTE — Progress Notes (Signed)
BMP DONE TODAY Massey Ruhland 

## 2013-07-13 ENCOUNTER — Encounter: Payer: Self-pay | Admitting: Family Medicine

## 2013-10-22 ENCOUNTER — Other Ambulatory Visit: Payer: Self-pay | Admitting: Family Medicine

## 2014-01-11 ENCOUNTER — Other Ambulatory Visit: Payer: Self-pay | Admitting: Family Medicine

## 2014-03-23 ENCOUNTER — Other Ambulatory Visit: Payer: Self-pay | Admitting: Family Medicine

## 2014-03-26 ENCOUNTER — Ambulatory Visit (INDEPENDENT_AMBULATORY_CARE_PROVIDER_SITE_OTHER): Payer: BLUE CROSS/BLUE SHIELD | Admitting: Family Medicine

## 2014-03-26 ENCOUNTER — Encounter: Payer: Self-pay | Admitting: Family Medicine

## 2014-03-26 VITALS — BP 132/84 | HR 64 | Temp 98.0°F | Ht 72.0 in | Wt 234.0 lb

## 2014-03-26 DIAGNOSIS — I10 Essential (primary) hypertension: Secondary | ICD-10-CM | POA: Diagnosis not present

## 2014-03-26 DIAGNOSIS — Z23 Encounter for immunization: Secondary | ICD-10-CM

## 2014-03-26 DIAGNOSIS — Z720 Tobacco use: Secondary | ICD-10-CM

## 2014-03-26 DIAGNOSIS — F3170 Bipolar disorder, currently in remission, most recent episode unspecified: Secondary | ICD-10-CM

## 2014-03-26 DIAGNOSIS — F17200 Nicotine dependence, unspecified, uncomplicated: Secondary | ICD-10-CM

## 2014-03-26 LAB — COMPREHENSIVE METABOLIC PANEL
ALK PHOS: 84 U/L (ref 39–117)
ALT: 41 U/L (ref 0–53)
AST: 30 U/L (ref 0–37)
Albumin: 4 g/dL (ref 3.5–5.2)
BUN: 25 mg/dL — AB (ref 6–23)
CO2: 22 mEq/L (ref 19–32)
Calcium: 9.3 mg/dL (ref 8.4–10.5)
Chloride: 104 mEq/L (ref 96–112)
Creat: 1.37 mg/dL — ABNORMAL HIGH (ref 0.50–1.35)
Glucose, Bld: 108 mg/dL — ABNORMAL HIGH (ref 70–99)
POTASSIUM: 5 meq/L (ref 3.5–5.3)
Sodium: 135 mEq/L (ref 135–145)
Total Bilirubin: 0.9 mg/dL (ref 0.2–1.2)
Total Protein: 7.3 g/dL (ref 6.0–8.3)

## 2014-03-26 LAB — CBC WITH DIFFERENTIAL/PLATELET
BASOS PCT: 0 % (ref 0–1)
Basophils Absolute: 0 10*3/uL (ref 0.0–0.1)
EOS ABS: 0.3 10*3/uL (ref 0.0–0.7)
Eosinophils Relative: 2 % (ref 0–5)
HCT: 44 % (ref 39.0–52.0)
Hemoglobin: 15 g/dL (ref 13.0–17.0)
LYMPHS ABS: 3.2 10*3/uL (ref 0.7–4.0)
Lymphocytes Relative: 21 % (ref 12–46)
MCH: 33.9 pg (ref 26.0–34.0)
MCHC: 34.1 g/dL (ref 30.0–36.0)
MCV: 99.5 fL (ref 78.0–100.0)
MONOS PCT: 10 % (ref 3–12)
MPV: 9.2 fL (ref 8.6–12.4)
Monocytes Absolute: 1.5 10*3/uL — ABNORMAL HIGH (ref 0.1–1.0)
NEUTROS PCT: 67 % (ref 43–77)
Neutro Abs: 10.1 10*3/uL — ABNORMAL HIGH (ref 1.7–7.7)
Platelets: 277 10*3/uL (ref 150–400)
RBC: 4.42 MIL/uL (ref 4.22–5.81)
RDW: 13.1 % (ref 11.5–15.5)
WBC: 15.1 10*3/uL — ABNORMAL HIGH (ref 4.0–10.5)

## 2014-03-26 LAB — LIPID PANEL
Cholesterol: 188 mg/dL (ref 0–200)
HDL: 54 mg/dL (ref >39)
LDL Cholesterol: 95 mg/dL (ref 0–99)
Total CHOL/HDL Ratio: 3.5 Ratio
Triglycerides: 195 mg/dL — ABNORMAL HIGH (ref <150)
VLDL: 39 mg/dL (ref 0–40)

## 2014-03-26 LAB — TSH: TSH: 3.512 u[IU]/mL (ref 0.350–4.500)

## 2014-03-26 NOTE — Assessment & Plan Note (Signed)
As evidenced by EEG findings in 2012 Not active now, on a PPI Currently on aspirin, has been on for more than a year, discussed risk and benefits with him and he like to continue aspirin

## 2014-03-26 NOTE — Assessment & Plan Note (Signed)
Admission in 2012 with multifactorial acute encephalopathy treated to aspiration pneumonia, felt to be also due to hepatic encephalopathy Ammonia elevated to 55 at that time Previously was taking lactulose several times a day, he does not like to do this due to diarrhea, this is understandable His wife feels that he has improved mentation whenever he takes the lactulose After discussion/bargaining with him he will try to take it 2-3 times per week, his wife is happy with this

## 2014-03-26 NOTE — Patient Instructions (Signed)
Great to see you!  Lets see you back in 3 months  Try to cut back your alcohol consumption by half

## 2014-03-26 NOTE — Progress Notes (Signed)
Patient ID: [name] [name], male   DOB: 03/20/1960, 54 y.o.   MRN: [id number]   HPI  Patient presents today for follow-up, patient has not been back in one year  Hypertension No chest pain, dyspnea, headaches, leg edema Exercises routinely by walking his dog for 40 minutes to an hour every day Taking meds, discussed one by one  Alcohol use Drinking approximately one fifth of gin and 3-4 beers daily Has thought about cutting back and realizes the heart is causing himself with alcohol, states that he is trying now to cut back  Peptic ulcer Still taking PPI No abdominal pain Has previously been taking 2 baby aspirin every day for over a year, after discussion of risks and benefits he like to continue taking one baby aspirin daily  Healthcare maintenance Would not like a colonoscopy, will think about it over the next 3 months  Bipolar disorder Feels his mood is stable, taking lithium everyday Follows with psychiatry every 6 months  Smoking status noted-  ROS: Per HPI  No fever, chills, sweats, dyspnea, palpitations, leg edema, headaches, bowel or bladder dysfunction, bowel changes, or mood changes  Objective: BP 132/84 mmHg  Pulse 64  Temp(Src) 98 F (36.7 C) (Oral)  Ht 6' (1.829 m)  Wt 234 lb (106.142 kg)  BMI 31.73 kg/m2 Gen: NAD, alert, cooperative with exam HEENT: NCAT, EOMI, PERRL, sclera white  CV: RRR, good S1/S2, no murmur Resp: CTABL, no wheezes, non-labored Abd: SNTND, BS present, no guarding or organomegaly Ext: No edema, warm, no asterixis Neuro: Alert and oriented, No gross deficits  Assessment and plan:  TOBACCO DEPENDENCE Pre-contemplative Encouraged   Routine health maintenance Declines colonoscopy today, discuss in 3 months in follow-up Flu shot given today   Peptic ulcer As evidenced by EEG findings in 2012 Not active now, on a PPI Currently on aspirin, has been on for more than a year, discussed risk and benefits with him and he like to  continue aspirin   HYPERTENSION, BENIGN SYSTEMIC Reasonably well controlled today Continue amlodipine, metoprolol, Aldactone Checking lipid panel today Follow-up 3 months   Hepatic encephalopathy Admission in 2012 with multifactorial acute encephalopathy treated to aspiration pneumonia, felt to be also due to hepatic encephalopathy Ammonia elevated to 55 at that time Previously was taking lactulose several times a day, he does not like to do this due to diarrhea, this is understandable His wife feels that he has improved mentation whenever he takes the lactulose After discussion/bargaining with him he will try to take it 2-3 times per week, his wife is happy with this    Bipolar disorder Follows with psychiatry outpatient, on lithium, stable   ANEMIA, FOLIC ACID DEFICIENCY Repeat CBC today Of note taking B-12 but not folate   Alcoholism Still with heavy alcohol use Discussed cutting back with him, he is contemplative Encouraged, follow-up 3 months    Orders Placed This Encounter  Procedures  . Flu Vaccine QUAD 36+ mos IM  . Comprehensive metabolic panel  . CBC with Differential  . Lithium level  . TSH  . Lipid Panel

## 2014-03-26 NOTE — Assessment & Plan Note (Signed)
Still with heavy alcohol use Discussed cutting back with him, he is contemplative Encouraged, follow-up 3 months

## 2014-03-26 NOTE — Assessment & Plan Note (Signed)
Reasonably well controlled today Continue amlodipine, metoprolol, Aldactone Checking lipid panel today Follow-up 3 months

## 2014-03-26 NOTE — Assessment & Plan Note (Signed)
Declines colonoscopy today, discuss in 3 months in follow-up Flu shot given today

## 2014-03-26 NOTE — Assessment & Plan Note (Signed)
Follows with psychiatry outpatient, on lithium, stable

## 2014-03-26 NOTE — Assessment & Plan Note (Signed)
Repeat CBC today Of note taking B-12 but not folate

## 2014-03-26 NOTE — Assessment & Plan Note (Signed)
Pre-contemplative Encouraged

## 2014-03-27 LAB — LITHIUM LEVEL: Lithium Lvl: 0.6 mEq/L — ABNORMAL LOW (ref 0.80–1.40)

## 2014-03-29 ENCOUNTER — Telehealth: Payer: Self-pay | Admitting: Family Medicine

## 2014-03-29 DIAGNOSIS — Z79899 Other long term (current) drug therapy: Secondary | ICD-10-CM | POA: Insufficient documentation

## 2014-03-29 DIAGNOSIS — I10 Essential (primary) hypertension: Secondary | ICD-10-CM

## 2014-03-29 MED ORDER — ATORVASTATIN CALCIUM 20 MG PO TABS: 20.0000 mg | ORAL_TABLET | Freq: Every day | ORAL | Status: DC

## 2014-03-29 NOTE — Telephone Encounter (Signed)
CalledTo discuss his recent lab work. He continues to not have any symptoms of illness. His WBC is 15 but there is no clear etiology of this, plan to repeat this in 3 months in follow-up.  His cholesterol is elevated and his ASCVD 10 year risk score is 18% today, start Lipitor 20 mg today. He understands this, follow-up 3 months.  Murtis SinkSam Caroleann Casler, MD [facility] Resident, PGY-3 03/29/2014, 12:30 PM

## 2014-06-03 ENCOUNTER — Other Ambulatory Visit: Payer: Self-pay | Admitting: Family Medicine

## 2014-06-03 MED ORDER — OMEGA-3-ACID ETHYL ESTERS 1 G PO CAPS: 1.0000 g | ORAL_CAPSULE | Freq: Every day | ORAL | Status: DC

## 2014-06-03 MED ORDER — ALLOPURINOL 300 MG PO TABS: 300.0000 mg | ORAL_TABLET | Freq: Every day | ORAL | Status: DC

## 2014-06-03 MED ORDER — METOPROLOL TARTRATE 100 MG PO TABS: 100.0000 mg | ORAL_TABLET | Freq: Two times a day (BID) | ORAL | Status: DC

## 2014-06-03 MED ORDER — SPIRONOLACTONE 50 MG PO TABS: 50.0000 mg | ORAL_TABLET | Freq: Every day | ORAL | Status: DC

## 2014-06-03 MED ORDER — FOLIC ACID 1 MG PO TABS: 1.0000 mg | ORAL_TABLET | Freq: Every day | ORAL | Status: DC

## 2014-06-03 NOTE — Telephone Encounter (Signed)
All meds requested sent, will ask nursing to notify.   Murtis SinkSam Bradshaw, MD [facility] Resident, PGY-3 06/03/2014, 12:11 PM

## 2014-06-03 NOTE — Telephone Encounter (Signed)
Will forward to MD.  

## 2014-06-03 NOTE — Telephone Encounter (Signed)
Wife called because she said the pharmacy has not heard back from our office on his refills. He is out of all his medications. I asked her which ones but she didn't have the names and she was at work. Can we send in his medication since after today he will not have any left. jw

## 2014-06-03 NOTE — Telephone Encounter (Signed)
Pt calling back, he needs metoprolol, omega 3, allopurinol, spironolactone, and folic acid, [facility] to [facility]

## 2014-06-03 NOTE — Telephone Encounter (Signed)
Left message on patient voicemail that we have'nt received any requests from pharmacy and for him to call back and let us know exactly what meds need to be refilled.

## 2014-09-09 ENCOUNTER — Encounter: Payer: Self-pay | Admitting: Family Medicine

## 2014-09-09 ENCOUNTER — Ambulatory Visit (INDEPENDENT_AMBULATORY_CARE_PROVIDER_SITE_OTHER): Payer: BLUE CROSS/BLUE SHIELD | Admitting: Family Medicine

## 2014-09-09 VITALS — BP 127/89 | HR 66 | Temp 98.0°F | Ht 72.0 in | Wt 232.0 lb

## 2014-09-09 DIAGNOSIS — Z79899 Other long term (current) drug therapy: Secondary | ICD-10-CM | POA: Diagnosis not present

## 2014-09-09 NOTE — Progress Notes (Signed)
   HPI  CC: new med f/u and meeting new PCP  Patient presents to meet new PCP and to discuss his new medication he was recently started on (Lipitor). Patient denied any side effects. No nausea, vomiting, fever, chills, muscle aches. Patient reports good compliance w/ medication. He had some questions about the function of this medication. Patient denies any changes since last visit.  Review of Systems   See HPI for ROS. All other systems reviewed and are negative.  Past medical history and social history reviewed and updated in the EMR as appropriate.  Objective: BP 127/89 mmHg  Pulse 66  Temp(Src) 98 F (36.7 C) (Oral)  Ht 6' (1.829 m)  Wt 232 lb (105.235 kg)  BMI 31.46 kg/m2 Gen: NAD, alert, cooperative, and pleasant. HEENT: NCAT, EOMI, PERRL CV: RRR, no murmur Resp: CTAB, no wheezes, non-labored Abd: SNTND, BS present, no guarding or organomegaly Ext: No edema, warm Neuro: Alert and oriented, Speech clear but sometimes slowed.  Assessment and plan:  On statin therapy due to risk of future cardiovascular event Patient denies any issues w/ new medication. I was able to discuss the function of statins w/ him today. He and his wife had many good questions and I was able to answer to their satisfaction. - Continue Lipitor    Kathee DeltonIan D McKeag, MD,MS,  PGY2 09/09/2014 6:16 PM

## 2014-09-09 NOTE — Patient Instructions (Signed)
It was a pleasure seeing you today in our clinic. Today we discussed your new medication. Here is the treatment plan we have discussed and agreed upon together:   - I am thrilled this new medication is working well for you. Keep taking it everyday. It is now a very important part of your medication regimen.  - I'd like to see you back in 3-6 months to recheck your cholesterol levels.  - If you have any questions about your medications or have any side effects that develop please do not hesitate to contact the office or set up an appointment to see me.

## 2014-09-09 NOTE — Assessment & Plan Note (Signed)
Patient denies any issues w/ new medication. I was able to discuss the function of statins w/ him today. He and his wife had many good questions and I was able to answer to their satisfaction. - Continue Lipitor

## 2014-12-06 ENCOUNTER — Other Ambulatory Visit: Payer: Self-pay | Admitting: Family Medicine

## 2014-12-06 DIAGNOSIS — K269 Duodenal ulcer, unspecified as acute or chronic, without hemorrhage or perforation: Secondary | ICD-10-CM

## 2014-12-06 DIAGNOSIS — K7290 Hepatic failure, unspecified without coma: Secondary | ICD-10-CM

## 2014-12-06 DIAGNOSIS — K7682 Hepatic encephalopathy: Secondary | ICD-10-CM

## 2014-12-06 DIAGNOSIS — Z8719 Personal history of other diseases of the digestive system: Secondary | ICD-10-CM | POA: Insufficient documentation

## 2014-12-06 HISTORY — DX: Duodenal ulcer, unspecified as acute or chronic, without hemorrhage or perforation: K26.9

## 2014-12-06 MED ORDER — LACTULOSE 10 GM/15ML PO SOLN: 30.0000 g | Freq: Every day | ORAL | Status: DC

## 2014-12-06 NOTE — Telephone Encounter (Signed)
Pt wife is calling on behalf of pt and states that he has been without his lactulose (CHRONULAC) 10 GM/15ML solution for about 5 days now. She states that [facility] was to sent [facility] refill [facility]. Needs it refilled ASAP. Sadie Reynolds, ASA

## 2014-12-07 ENCOUNTER — Inpatient Hospital Stay (HOSPITAL_COMMUNITY)
Admission: EM | Admit: 2014-12-07 | Discharge: 2014-12-10 | DRG: 378 | Disposition: A | Discharge status: Home / Self Care | Payer: BLUE CROSS/BLUE SHIELD | Attending: Family Medicine | Admitting: Family Medicine

## 2014-12-07 ENCOUNTER — Encounter (HOSPITAL_COMMUNITY): Payer: Self-pay | Admitting: *Deleted

## 2014-12-07 ENCOUNTER — Emergency Department (HOSPITAL_COMMUNITY): Payer: BLUE CROSS/BLUE SHIELD

## 2014-12-07 DIAGNOSIS — I10 Essential (primary) hypertension: Secondary | ICD-10-CM | POA: Diagnosis present

## 2014-12-07 DIAGNOSIS — Z79899 Other long term (current) drug therapy: Secondary | ICD-10-CM

## 2014-12-07 DIAGNOSIS — E872 Acidosis, unspecified: Secondary | ICD-10-CM

## 2014-12-07 DIAGNOSIS — Z7982 Long term (current) use of aspirin: Secondary | ICD-10-CM

## 2014-12-07 DIAGNOSIS — Z66 Do not resuscitate: Secondary | ICD-10-CM | POA: Diagnosis present

## 2014-12-07 DIAGNOSIS — F319 Bipolar disorder, unspecified: Secondary | ICD-10-CM | POA: Diagnosis present

## 2014-12-07 DIAGNOSIS — K449 Diaphragmatic hernia without obstruction or gangrene: Secondary | ICD-10-CM | POA: Diagnosis present

## 2014-12-07 DIAGNOSIS — R109 Unspecified abdominal pain: Secondary | ICD-10-CM | POA: Diagnosis present

## 2014-12-07 DIAGNOSIS — E875 Hyperkalemia: Secondary | ICD-10-CM | POA: Diagnosis not present

## 2014-12-07 DIAGNOSIS — F17210 Nicotine dependence, cigarettes, uncomplicated: Secondary | ICD-10-CM | POA: Diagnosis present

## 2014-12-07 DIAGNOSIS — K264 Chronic or unspecified duodenal ulcer with hemorrhage: Principal | ICD-10-CM | POA: Insufficient documentation

## 2014-12-07 DIAGNOSIS — Z8711 Personal history of peptic ulcer disease: Secondary | ICD-10-CM

## 2014-12-07 DIAGNOSIS — Z880 Allergy status to penicillin: Secondary | ICD-10-CM

## 2014-12-07 DIAGNOSIS — D649 Anemia, unspecified: Secondary | ICD-10-CM | POA: Diagnosis present

## 2014-12-07 DIAGNOSIS — Z888 Allergy status to other drugs, medicaments and biological substances status: Secondary | ICD-10-CM

## 2014-12-07 DIAGNOSIS — N2889 Other specified disorders of kidney and ureter: Secondary | ICD-10-CM | POA: Diagnosis present

## 2014-12-07 DIAGNOSIS — E119 Type 2 diabetes mellitus without complications: Secondary | ICD-10-CM | POA: Diagnosis present

## 2014-12-07 DIAGNOSIS — M109 Gout, unspecified: Secondary | ICD-10-CM | POA: Diagnosis present

## 2014-12-07 DIAGNOSIS — K209 Esophagitis, unspecified: Secondary | ICD-10-CM | POA: Diagnosis present

## 2014-12-07 HISTORY — DX: Bipolar disorder, unspecified: F31.9

## 2014-12-07 HISTORY — DX: Alcohol abuse, uncomplicated: F10.10

## 2014-12-07 HISTORY — DX: Hyperkalemia: E87.5

## 2014-12-07 HISTORY — DX: Gastric ulcer, unspecified as acute or chronic, without hemorrhage or perforation: K25.9

## 2014-12-07 HISTORY — DX: Hepatic failure, unspecified without coma: K72.90

## 2014-12-07 LAB — CBC WITH DIFFERENTIAL/PLATELET
BASOS ABS: 0 10*3/uL (ref 0.0–0.1)
Basophils Relative: 0 %
Eosinophils Absolute: 0.2 10*3/uL (ref 0.0–0.7)
Eosinophils Relative: 1 %
HEMATOCRIT: 41.1 % (ref 39.0–52.0)
Hemoglobin: 14 g/dL (ref 13.0–17.0)
LYMPHS PCT: 9 %
Lymphs Abs: 2.1 10*3/uL (ref 0.7–4.0)
MCH: 34.1 pg — ABNORMAL HIGH (ref 26.0–34.0)
MCHC: 34.1 g/dL (ref 30.0–36.0)
MCV: 100 fL (ref 78.0–100.0)
MONOS PCT: 7 %
Monocytes Absolute: 1.6 10*3/uL — ABNORMAL HIGH (ref 0.1–1.0)
NEUTROS ABS: 18.7 10*3/uL — AB (ref 1.7–7.7)
Neutrophils Relative %: 83 %
Platelets: 242 10*3/uL (ref 150–400)
RBC: 4.11 MIL/uL — ABNORMAL LOW (ref 4.22–5.81)
RDW: 13.2 % (ref 11.5–15.5)
WBC: 22.7 10*3/uL — ABNORMAL HIGH (ref 4.0–10.5)

## 2014-12-07 LAB — URINALYSIS, ROUTINE W REFLEX MICROSCOPIC
Bilirubin Urine: NEGATIVE
GLUCOSE, UA: NEGATIVE mg/dL
Hgb urine dipstick: NEGATIVE
Ketones, ur: NEGATIVE mg/dL
Nitrite: NEGATIVE
PH: 5.5 (ref 5.0–8.0)
Protein, ur: NEGATIVE mg/dL
Specific Gravity, Urine: 1.011 (ref 1.005–1.030)
Urobilinogen, UA: 0.2 mg/dL (ref 0.0–1.0)

## 2014-12-07 LAB — COMPREHENSIVE METABOLIC PANEL
ALBUMIN: 3.9 g/dL (ref 3.5–5.0)
ALT: 31 U/L (ref 17–63)
AST: 21 U/L (ref 15–41)
Alkaline Phosphatase: 80 U/L (ref 38–126)
Anion gap: 10 (ref 5–15)
BUN: 31 mg/dL — AB (ref 6–20)
CHLORIDE: 105 mmol/L (ref 101–111)
CO2: 16 mmol/L — ABNORMAL LOW (ref 22–32)
Calcium: 10 mg/dL (ref 8.9–10.3)
Creatinine, Ser: 1.3 mg/dL — ABNORMAL HIGH (ref 0.61–1.24)
GFR calc Af Amer: >60 mL/min (ref >60)
GFR calc non Af Amer: 60 mL/min — ABNORMAL LOW (ref >60)
GLUCOSE: 132 mg/dL — AB (ref 65–99)
POTASSIUM: 5.4 mmol/L — AB (ref 3.5–5.1)
Sodium: 131 mmol/L — ABNORMAL LOW (ref 135–145)
Total Bilirubin: 0.8 mg/dL (ref 0.3–1.2)
Total Protein: 8 g/dL (ref 6.5–8.1)

## 2014-12-07 LAB — URINE MICROSCOPIC-ADD ON

## 2014-12-07 LAB — LIPASE, BLOOD: LIPASE: 41 U/L (ref 22–51)

## 2014-12-07 LAB — I-STAT CG4 LACTIC ACID, ED: LACTIC ACID, VENOUS: 0.69 mmol/L (ref 0.5–2.0)

## 2014-12-07 MED ORDER — ONDANSETRON HCL 4 MG/2ML IJ SOLN
4.0000 mg | Freq: Once | INTRAMUSCULAR | Status: AC
  Administered 2014-12-07: 4 mg via INTRAVENOUS
  Filled 2014-12-07: qty 2

## 2014-12-07 MED ORDER — SODIUM CHLORIDE 0.9 % IV BOLUS (SEPSIS)
1000.0000 mL | Freq: Once | INTRAVENOUS | Status: AC
  Administered 2014-12-07: 1000 mL via INTRAVENOUS

## 2014-12-07 MED ORDER — HYDROCODONE-ACETAMINOPHEN 5-325 MG PO TABS
1.0000 | ORAL_TABLET | Freq: Once | ORAL | Status: AC
  Administered 2014-12-07: 1 via ORAL

## 2014-12-07 MED ORDER — MORPHINE SULFATE (PF) 4 MG/ML IV SOLN
4.0000 mg | Freq: Once | INTRAVENOUS | Status: AC
  Administered 2014-12-07: 4 mg via INTRAVENOUS
  Filled 2014-12-07: qty 1

## 2014-12-07 MED ORDER — HYDROCODONE-ACETAMINOPHEN 5-325 MG PO TABS
ORAL_TABLET | ORAL | Status: AC
  Filled 2014-12-07: qty 1

## 2014-12-07 NOTE — ED Notes (Signed)
Patient presents with c/o upper abd pain that has been going on since last Wednesday.  Stated he had ulcers "years ago" but thinks they may be back since he has been drinking again

## 2014-12-07 NOTE — ED Provider Notes (Signed)
By signing my name below, I, Bethel Born, attest that this documentation has been prepared under the direction and in the presence of Tyrome Donatelli N Adriel Kessen, DO. Electronically Signed: Bethel Born, ED Scribe. 12/08/2014. 12:12 AM.  TIME SEEN: 11:23 PM  CHIEF COMPLAINT: abdominal pain  HPI: [name] [name] is a 54 y.o. male with history of HTN and DM who presents to the Emergency Department complaining of constant and diffuse abdominal pain with onset 6 days ago. This pain is similar to pain that he had years ago with ulcers. Drinking soda exacerbates the pain. Advil provided some transient relief at home earlier in the week. Associated symptoms include nausea and new black and bloody stools (last episode this morning). Pt denies vomiting. Primary Care at [facility]. He drinks a fifth of liquor per week and his last drink was last week. No history of abdominal surgery. His last EGD was 10 years and he has never had a colonoscopy. He is not on a iron supplement and has not been using Pepto Bismol. He uses a low dose aspirin daily but no other blood thinners. Has had nausea but no vomiting or diarrhea. No fever. No chest pain or shortness of breath.  ROS: See HPI Constitutional: no fever  Eyes: no drainage  ENT: no runny nose   Cardiovascular:  no chest pain  Resp: no SOB  GI: no vomiting GU: no dysuria Integumentary: no rash  Allergy: no hives  Musculoskeletal: no leg swelling  Neurological: no slurred speech ROS otherwise negative  PAST MEDICAL HISTORY/PAST SURGICAL HISTORY:  Past Medical History  Diagnosis Date  . Hypertension   . Diabetes mellitus     MEDICATIONS:  Prior to Admission medications   Medication Sig Start Date End Date Taking? Authorizing Provider  allopurinol (ZYLOPRIM) 300 MG tablet Take 1 tablet (300 mg total) by mouth daily. 06/03/14   Elenora Gamma, MD  amLODipine (NORVASC) 10 MG tablet Take 1 tablet (10 mg total) by mouth daily. 05/12/10   Floydene Flock, MD  aspirin 81 MG chewable tablet Chew 1 tablet (81 mg total) by mouth daily. 05/12/10   Floydene Flock, MD  atorvastatin (LIPITOR) 20 MG tablet Take 1 tablet (20 mg total) by mouth daily. 03/29/14   Elenora Gamma, MD  FLUoxetine (PROZAC) 40 MG capsule Take 1 capsule (40 mg total) by mouth daily. 05/12/10   Floydene Flock, MD  folic acid (FOLVITE) 1 MG tablet Take 1 tablet (1 mg total) by mouth daily. 06/03/14   Elenora Gamma, MD  lactulose (CHRONULAC) 10 GM/15ML solution Take 45 mLs (30 g total) by mouth daily. 12/06/14   Kathee Delton, MD  lithium (ESKALITH) 450 MG CR tablet Take 1 tablet (450 mg total) by mouth daily. For mental health 05/12/10   Floydene Flock, MD  metoprolol (LOPRESSOR) 100 MG tablet Take 1 tablet (100 mg total) by mouth 2 (two) times daily. 06/03/14   Elenora Gamma, MD  Multiple Vitamin (MULTIVITAMIN) capsule Take 1 capsule by mouth daily. 05/12/10   Floydene Flock, MD  omega-3 acid ethyl esters (LOVAZA) 1 G capsule Take 1 capsule (1 g total) by mouth daily. 06/03/14   Elenora Gamma, MD  omeprazole (PRILOSEC) 40 MG capsule Take 1 capsule (40 mg total) by mouth daily. 05/21/11   Floydene Flock, MD  spironolactone (ALDACTONE) 50 MG tablet Take 1 tablet (50 mg total) by mouth daily. 06/03/14   Elenora Gamma, MD  terbinafine (  LAMISIL) 1 % cream Apply topically 2 (two) times daily. To affected areas for 2 to 3 weeks. Patient not taking: Reported on 03/26/2014 12/25/11   Elenora Gamma, MD  Thiamine HCl (VITAMIN B-1) 100 MG tablet Take 1 tablet (100 mg total) by mouth daily. Patient not taking: Reported on 03/26/2014 05/12/10   Floydene Flock, MD    ALLERGIES:  Allergies  Allergen Reactions  . Ace Inhibitors     REACTION: angioedema  . Penicillins     REACTION: rash    SOCIAL HISTORY:  Social History  Substance Use Topics  . Smoking status: Current Every Day Smoker -- 0.50 packs/day    Types: Cigarettes  . Smokeless tobacco: Never Used     Comment:  does not want to quitt right now  . Alcohol Use: 8.4 oz/week    14 Cans of beer, 0 Shots of liquor per week    FAMILY HISTORY: No family history on file.  EXAM: BP 136/89 mmHg  Pulse 70  Temp(Src) 97.6 F (36.4 C) (Oral)  Resp 22  Ht 6' (1.829 m)  Wt 234 lb (106.142 kg)  BMI 31.73 kg/m2  SpO2 98% CONSTITUTIONAL: Alert and oriented and responds appropriately to questions. Chronically ill-appearing, appears uncomfortable but in no apparent distress HEAD: Normocephalic EYES: Conjunctivae clear, PERRL ENT: normal nose; no rhinorrhea; moist mucous membranes; pharynx without lesions noted NECK: Supple, no meningismus, no LAD  CARD: RRR; S1 and S2 appreciated; no murmurs, no clicks, no rubs, no gallops RESP: Normal chest excursion without splinting or tachypnea; breath sounds clear and equal bilaterally; no wheezes, no rhonchi, no rales, no hypoxia or respiratory distress, speaking full sentences ABD/GI: Normal bowel sounds; non-distended; soft, diffusely tender to palpation throughout the abdomen, no rebound, no guarding, no peritoneal signs RECTAL:  No gross blood or melena, guaiac positive, normal rectal tone BACK:  The back appears normal and is non-tender to palpation, there is no CVA tenderness EXT: Normal ROM in all joints; non-tender to palpation; no edema; normal capillary refill; no cyanosis, no calf tenderness or swelling    SKIN: Normal color for age and race; warm NEURO: Moves all extremities equally, sensation to light touch intact diffusely, cranial nerves II through XII intact PSYCH: The patient's mood and manner are appropriate. Grooming and personal hygiene are appropriate.  MEDICAL DECISION MAKING: Patient here with complaints of diffuse abdominal pain. Patient's labs and transient hyperkalemia and metabolic acidosis with a bicarbonate of 16. Anion gap is normal. Blood glucose is normal. Unclear etiology for his metabolic acidosis. He denies drinking anything other than  alcohol and last drink was over a week ago. We'll check salicylate level, lactate. Lithium level also pending. He is guaiac positive on exam but has no gross blood or melena. EKG shows no interval changes. We'll give IV fluids and repeat a chemistry. Will obtain a CT of his abdomen and pelvis. We'll give pain and nausea medicine. Differential diagnosis includes gastritis, ulcer, colitis, appendicitis, bowel obstruction, cholelithiasis or cholecystitis, pancreatitis.  ED PROGRESS: Patient's salicylate level is normal and lithium level is therapeutic. Lactate normal. CT scan shows 2.2 cm mass in the right kidney but no bowel obstruction, inflammation, appendicitis. Gallbladder, pancreas appear normal. Patient reports his pain is improved. Given GI cocktail and Protonix as well as I suspect that his pain is secondary to gastritis versus ulcer. Repeat labs however show worsening potassium and worsening of bicarbonate. We'll continue IV hydration. I feel he will need admission for correction of his electrolyte  abnormality. Discussed with Dr. Kennon Rounds family medicine resident for admission. We'll admit to telemetry. Attending is Dr. Pollie Meyer.      EKG Interpretation  Date/Time:  Tuesday December 07 2014 20:00:06 EDT Ventricular Rate:  70 PR Interval:  182 QRS Duration: 82 QT Interval:  368 QTC Calculation: 397 R Axis:   -9 Text Interpretation:  Normal sinus rhythm Minimal voltage criteria for LVH, may be normal variant Inferior infarct , age undetermined Abnormal ECG Confirmed by Shaya Altamura,  DO, Icyss Skog ([id number]) on 12/07/2014 11:11:40 PM       I personally performed the services described in this documentation, which was scribed in my presence. The recorded information has been reviewed and is accurate.     [name] Maw Himmat Enberg, DO 12/08/14 [phone]

## 2014-12-08 ENCOUNTER — Encounter (HOSPITAL_COMMUNITY): Payer: Self-pay | Admitting: General Practice

## 2014-12-08 DIAGNOSIS — R109 Unspecified abdominal pain: Secondary | ICD-10-CM | POA: Diagnosis not present

## 2014-12-08 DIAGNOSIS — E875 Hyperkalemia: Secondary | ICD-10-CM

## 2014-12-08 DIAGNOSIS — Z8711 Personal history of peptic ulcer disease: Secondary | ICD-10-CM | POA: Diagnosis not present

## 2014-12-08 DIAGNOSIS — I10 Essential (primary) hypertension: Secondary | ICD-10-CM | POA: Diagnosis present

## 2014-12-08 DIAGNOSIS — E119 Type 2 diabetes mellitus without complications: Secondary | ICD-10-CM | POA: Diagnosis present

## 2014-12-08 DIAGNOSIS — D649 Anemia, unspecified: Secondary | ICD-10-CM | POA: Diagnosis present

## 2014-12-08 DIAGNOSIS — K264 Chronic or unspecified duodenal ulcer with hemorrhage: Secondary | ICD-10-CM | POA: Diagnosis present

## 2014-12-08 DIAGNOSIS — K209 Esophagitis, unspecified: Secondary | ICD-10-CM | POA: Diagnosis present

## 2014-12-08 DIAGNOSIS — N2889 Other specified disorders of kidney and ureter: Secondary | ICD-10-CM | POA: Diagnosis present

## 2014-12-08 DIAGNOSIS — F17210 Nicotine dependence, cigarettes, uncomplicated: Secondary | ICD-10-CM | POA: Diagnosis present

## 2014-12-08 DIAGNOSIS — E872 Acidosis, unspecified: Secondary | ICD-10-CM | POA: Insufficient documentation

## 2014-12-08 DIAGNOSIS — Z7982 Long term (current) use of aspirin: Secondary | ICD-10-CM | POA: Diagnosis not present

## 2014-12-08 DIAGNOSIS — Z66 Do not resuscitate: Secondary | ICD-10-CM | POA: Diagnosis present

## 2014-12-08 DIAGNOSIS — F319 Bipolar disorder, unspecified: Secondary | ICD-10-CM | POA: Diagnosis present

## 2014-12-08 DIAGNOSIS — K449 Diaphragmatic hernia without obstruction or gangrene: Secondary | ICD-10-CM | POA: Diagnosis present

## 2014-12-08 DIAGNOSIS — Z79899 Other long term (current) drug therapy: Secondary | ICD-10-CM | POA: Diagnosis not present

## 2014-12-08 DIAGNOSIS — M109 Gout, unspecified: Secondary | ICD-10-CM | POA: Diagnosis present

## 2014-12-08 DIAGNOSIS — Z880 Allergy status to penicillin: Secondary | ICD-10-CM | POA: Diagnosis not present

## 2014-12-08 DIAGNOSIS — Z888 Allergy status to other drugs, medicaments and biological substances status: Secondary | ICD-10-CM | POA: Diagnosis not present

## 2014-12-08 HISTORY — DX: Hyperkalemia: E87.5

## 2014-12-08 LAB — I-STAT ARTERIAL BLOOD GAS, ED
Acid-base deficit: 11 mmol/L — ABNORMAL HIGH (ref 0.0–2.0)
BICARBONATE: 15.3 meq/L — AB (ref 20.0–24.0)
O2 SAT: 91 %
PCO2 ART: 35.3 mmHg (ref 35.0–45.0)
PO2 ART: 71 mmHg — AB (ref 80.0–100.0)
Patient temperature: 98.6
TCO2: 16 mmol/L (ref 0–100)
pH, Arterial: 7.245 — ABNORMAL LOW (ref 7.350–7.450)

## 2014-12-08 LAB — SALICYLATE LEVEL

## 2014-12-08 LAB — I-STAT CHEM 8, ED
BUN: 41 mg/dL — AB (ref 6–20)
CHLORIDE: 115 mmol/L — AB (ref 101–111)
Calcium, Ion: 1 mmol/L — ABNORMAL LOW (ref 1.12–1.23)
Creatinine, Ser: 1.2 mg/dL (ref 0.61–1.24)
Glucose, Bld: 96 mg/dL (ref 65–99)
HEMATOCRIT: 35 % — AB (ref 39.0–52.0)
Hemoglobin: 11.9 g/dL — ABNORMAL LOW (ref 13.0–17.0)
Potassium: 5.8 mmol/L — ABNORMAL HIGH (ref 3.5–5.1)
SODIUM: 134 mmol/L — AB (ref 135–145)
TCO2: 14 mmol/L (ref 0–100)

## 2014-12-08 LAB — CBC
HCT: 37 % — ABNORMAL LOW (ref 39.0–52.0)
HEMOGLOBIN: 12.3 g/dL — AB (ref 13.0–17.0)
MCH: 34.1 pg — ABNORMAL HIGH (ref 26.0–34.0)
MCHC: 33.2 g/dL (ref 30.0–36.0)
MCV: 102.5 fL — ABNORMAL HIGH (ref 78.0–100.0)
Platelets: 193 10*3/uL (ref 150–400)
RBC: 3.61 MIL/uL — ABNORMAL LOW (ref 4.22–5.81)
RDW: 13.4 % (ref 11.5–15.5)
WBC: 15.8 10*3/uL — ABNORMAL HIGH (ref 4.0–10.5)

## 2014-12-08 LAB — TYPE AND SCREEN
ABO/RH(D): O POS
Antibody Screen: NEGATIVE

## 2014-12-08 LAB — BASIC METABOLIC PANEL
ANION GAP: 8 (ref 5–15)
BUN: 26 mg/dL — ABNORMAL HIGH (ref 6–20)
CALCIUM: 8.5 mg/dL — AB (ref 8.9–10.3)
CO2: 16 mmol/L — ABNORMAL LOW (ref 22–32)
Chloride: 110 mmol/L (ref 101–111)
Creatinine, Ser: 1.24 mg/dL (ref 0.61–1.24)
GLUCOSE: 107 mg/dL — AB (ref 65–99)
Potassium: 4.8 mmol/L (ref 3.5–5.1)
Sodium: 134 mmol/L — ABNORMAL LOW (ref 135–145)

## 2014-12-08 LAB — ETHANOL: Alcohol, Ethyl (B): <5 mg/dL (ref <5)

## 2014-12-08 LAB — LITHIUM LEVEL: LITHIUM LVL: 0.73 mmol/L (ref 0.60–1.20)

## 2014-12-08 LAB — PROTIME-INR
INR: 1.13 (ref 0.00–1.49)
PROTHROMBIN TIME: 14.7 s (ref 11.6–15.2)

## 2014-12-08 LAB — I-STAT CG4 LACTIC ACID, ED: Lactic Acid, Venous: 1.24 mmol/L (ref 0.5–2.0)

## 2014-12-08 LAB — LACTIC ACID, PLASMA
LACTIC ACID, VENOUS: 1.3 mmol/L (ref 0.5–2.0)
LACTIC ACID, VENOUS: 1.2 mmol/L (ref 0.5–2.0)

## 2014-12-08 LAB — POC OCCULT BLOOD, ED: Fecal Occult Bld: POSITIVE — AB

## 2014-12-08 MED ORDER — ALLOPURINOL 300 MG PO TABS
300.0000 mg | ORAL_TABLET | Freq: Every day | ORAL | Status: DC
  Administered 2014-12-08 – 2014-12-10 (×3): 300 mg via ORAL
  Filled 2014-12-08 (×3): qty 1
  Filled 2014-12-08: qty 3
  Filled 2014-12-08: qty 1

## 2014-12-08 MED ORDER — ATORVASTATIN CALCIUM 20 MG PO TABS
20.0000 mg | ORAL_TABLET | Freq: Every day | ORAL | Status: DC
  Administered 2014-12-08 – 2014-12-10 (×3): 20 mg via ORAL
  Filled 2014-12-08 (×4): qty 1

## 2014-12-08 MED ORDER — FLUOXETINE HCL 20 MG PO CAPS
40.0000 mg | ORAL_CAPSULE | Freq: Every day | ORAL | Status: DC
  Administered 2014-12-08 – 2014-12-10 (×3): 40 mg via ORAL
  Filled 2014-12-08 (×4): qty 2

## 2014-12-08 MED ORDER — PANTOPRAZOLE SODIUM 40 MG PO TBEC: 40.0000 mg | DELAYED_RELEASE_TABLET | Freq: Every day | ORAL | Status: DC

## 2014-12-08 MED ORDER — LACTULOSE 10 GM/15ML PO SOLN
30.0000 g | Freq: Every day | ORAL | Status: DC
  Administered 2014-12-09 – 2014-12-10 (×2): 30 g via ORAL
  Filled 2014-12-08 (×3): qty 45

## 2014-12-08 MED ORDER — HYDROMORPHONE HCL 1 MG/ML IJ SOLN
INTRAMUSCULAR | Status: AC
  Filled 2014-12-08: qty 1

## 2014-12-08 MED ORDER — ACETAMINOPHEN 650 MG RE SUPP: 650.0000 mg | Freq: Four times a day (QID) | RECTAL | Status: DC | PRN

## 2014-12-08 MED ORDER — AMLODIPINE BESYLATE 10 MG PO TABS
10.0000 mg | ORAL_TABLET | Freq: Every day | ORAL | Status: DC
  Administered 2014-12-08 – 2014-12-10 (×3): 10 mg via ORAL
  Filled 2014-12-08 (×2): qty 1
  Filled 2014-12-08: qty 2
  Filled 2014-12-08: qty 1

## 2014-12-08 MED ORDER — PANTOPRAZOLE SODIUM 40 MG IV SOLR
40.0000 mg | Freq: Once | INTRAVENOUS | Status: AC
  Administered 2014-12-08: 40 mg via INTRAVENOUS
  Filled 2014-12-08: qty 40

## 2014-12-08 MED ORDER — SODIUM POLYSTYRENE SULFONATE 15 GM/60ML PO SUSP
15.0000 g | Freq: Once | ORAL | Status: AC
  Administered 2014-12-08: 15 g via ORAL
  Filled 2014-12-08: qty 60

## 2014-12-08 MED ORDER — LORAZEPAM 1 MG PO TABS: 1.0000 mg | ORAL_TABLET | Freq: Four times a day (QID) | ORAL | Status: DC | PRN

## 2014-12-08 MED ORDER — VITAMIN B-1 100 MG PO TABS
100.0000 mg | ORAL_TABLET | Freq: Every day | ORAL | Status: DC
  Administered 2014-12-09 – 2014-12-10 (×2): 100 mg via ORAL
  Filled 2014-12-08 (×3): qty 1

## 2014-12-08 MED ORDER — GI COCKTAIL ~~LOC~~
30.0000 mL | Freq: Once | ORAL | Status: AC
  Administered 2014-12-08: 30 mL via ORAL
  Filled 2014-12-08: qty 30

## 2014-12-08 MED ORDER — SPIRONOLACTONE 25 MG PO TABS
50.0000 mg | ORAL_TABLET | Freq: Every day | ORAL | Status: DC
  Administered 2014-12-08 – 2014-12-10 (×3): 50 mg via ORAL
  Filled 2014-12-08 (×3): qty 2
  Filled 2014-12-08: qty 1

## 2014-12-08 MED ORDER — FOLIC ACID 1 MG PO TABS
1.0000 mg | ORAL_TABLET | Freq: Every day | ORAL | Status: DC
  Administered 2014-12-09 – 2014-12-10 (×2): 1 mg via ORAL
  Filled 2014-12-08 (×3): qty 1

## 2014-12-08 MED ORDER — IOHEXOL 300 MG/ML  SOLN
100.0000 mL | Freq: Once | INTRAMUSCULAR | Status: AC | PRN
  Administered 2014-12-08: 100 mL via INTRAVENOUS

## 2014-12-08 MED ORDER — ACETAMINOPHEN 325 MG PO TABS
650.0000 mg | ORAL_TABLET | Freq: Four times a day (QID) | ORAL | Status: DC | PRN
  Administered 2014-12-09: 650 mg via ORAL
  Filled 2014-12-08: qty 2

## 2014-12-08 MED ORDER — NICOTINE 14 MG/24HR TD PT24
14.0000 mg | MEDICATED_PATCH | Freq: Every day | TRANSDERMAL | Status: DC
  Administered 2014-12-08 – 2014-12-10 (×3): 14 mg via TRANSDERMAL
  Filled 2014-12-08 (×3): qty 1

## 2014-12-08 MED ORDER — SODIUM CHLORIDE 0.9 % IV SOLN
INTRAVENOUS | Status: DC
  Administered 2014-12-08 – 2014-12-09 (×4): via INTRAVENOUS

## 2014-12-08 MED ORDER — ENOXAPARIN SODIUM 40 MG/0.4ML ~~LOC~~ SOLN: 40.0000 mg | SUBCUTANEOUS | Status: DC

## 2014-12-08 MED ORDER — LITHIUM CARBONATE ER 450 MG PO TBCR
450.0000 mg | EXTENDED_RELEASE_TABLET | Freq: Every day | ORAL | Status: DC
  Administered 2014-12-08 – 2014-12-10 (×3): 450 mg via ORAL
  Filled 2014-12-08 (×3): qty 1

## 2014-12-08 MED ORDER — LORAZEPAM 2 MG/ML IJ SOLN: 0.0000 mg | Freq: Two times a day (BID) | INTRAMUSCULAR | Status: DC

## 2014-12-08 MED ORDER — LORAZEPAM 2 MG/ML IJ SOLN: 0.0000 mg | Freq: Four times a day (QID) | INTRAMUSCULAR | Status: AC

## 2014-12-08 MED ORDER — SODIUM CHLORIDE 0.9 % IV BOLUS (SEPSIS)
1000.0000 mL | Freq: Once | INTRAVENOUS | Status: AC
  Administered 2014-12-08: 1000 mL via INTRAVENOUS

## 2014-12-08 MED ORDER — METOPROLOL TARTRATE 100 MG PO TABS
100.0000 mg | ORAL_TABLET | Freq: Two times a day (BID) | ORAL | Status: DC
  Administered 2014-12-08 – 2014-12-10 (×5): 100 mg via ORAL
  Filled 2014-12-08 (×6): qty 1
  Filled 2014-12-08: qty 4
  Filled 2014-12-08: qty 1

## 2014-12-08 MED ORDER — PANTOPRAZOLE SODIUM 40 MG IV SOLR
40.0000 mg | Freq: Two times a day (BID) | INTRAVENOUS | Status: DC
  Administered 2014-12-08 – 2014-12-10 (×5): 40 mg via INTRAVENOUS
  Filled 2014-12-08 (×6): qty 40

## 2014-12-08 MED ORDER — LORAZEPAM 2 MG/ML IJ SOLN: 1.0000 mg | Freq: Four times a day (QID) | INTRAMUSCULAR | Status: DC | PRN

## 2014-12-08 MED ORDER — M.V.I. ADULT IV INJ
INJECTION | Freq: Once | INTRAVENOUS | Status: AC
  Administered 2014-12-08: 08:00:00 via INTRAVENOUS
  Filled 2014-12-08: qty 1000

## 2014-12-08 MED ORDER — ADULT MULTIVITAMIN W/MINERALS CH
1.0000 | ORAL_TABLET | Freq: Every day | ORAL | Status: DC
  Administered 2014-12-08 – 2014-12-10 (×3): 1 via ORAL
  Filled 2014-12-08 (×3): qty 1

## 2014-12-08 NOTE — H&P (Signed)
Family Medicine Teaching [facility] Admission History and Physical Service Pager: [phone]  Patient name: [name] [name] Medical record number: [id number] Date of birth: 1960/11/23 Age: 54 y.o. Gender: male  Primary Care Provider: Mickie Hillier, MD Consultants: None Code Status: DNR  Chief Complaint: Abdominal pain, dark stools  Assessment and Plan: [name] [name] is a 54 y.o. male presenting with abdominal pain and dark stools . PMH is significant for H/o peptic ulcer, HTN. Tobacco abuse, alcohol abuse, depression, bipolar disorder, H/o hepatic encephalopathy  Abdominal pain/Dark stools: Given history of prior gastric ulcer on EGD in 2012 and now with diffuse abd pain and dark stools, FOBT + in ED, concern for recurrent ulcer with potentially upper GI bleed. Reports of some bright red blood per rectum concerning for lower GI bleed. Currently hemodynamically stable with last dark stool yesterday - Admit to telemetry - GI consult in AM - NPO except meds with MIVF - bilateral large bore IVs - IV protonix 40 mg BID - on aspirin 81 at home, will hold for now   Hyperkalemia; K on presentation 5.4 which trended up on repeat to 5.8 on istat after 3 L IVF, EKG without changes consistent with hyperkalemia. Given different collection method concern that the 5.8 read may be an error. However, his CO2 is low to 16 on BMP. Blood as is pending. Lactate is wnl but did trend up from .69 to 1.24 - f/u K this AM - if trending up repeat EKG, consider kayexelate, insulin + D5,    Etoh Abuse- no evidence of withdrawal - CIWA protocol - continue folic acid, thiamine supp  Tobacco abuse- Smokes 1/2 ppd - nicotine patch  Right renal Mass on Abd CT: Measures 2.2 cm, potentially visualized on abd [place] 04/2010, if this represent the same lesion, it was 1.7 cm at that time - Will need outpatient work up with likely further imaging with MRI  HTN- Stable - continue home norvasc, lopressor,  spironolactone  Bipolar disorder- Lithium level 0.73  - continue home lithium  Depression-  - continue home Prozac  Gout- on allopurinol as outpatient - continue allopurinol  H/o hepatic encephalopathy- no evidence of encephalopathy at this time, LFTS wnl - continue home lactulose - f/u INR - consider ammonia level if concerned for mental status change or development of asterixis  HLD - continue home lipitor  FEN/GI: NPO, MIVF Prophylaxis: SCDs for ppx, no medical VTE ppx due to GI bleed  Disposition: Telemetry, Admit to family practice teaching service, Attending Dr Pollie Meyer  History of Present Illness:  [name] [name] is a 54 y.o. male presenting with diffuse abdominal pain that started last Wednesday 10/5 and black stools that with some bright red blood per rectum that started on 10/10. He has a history of gastric ulcers and states that this pain is similar to that episode. His last dark stool was yesterday AM,. He denies having stool like these before. He denies nausea/emesis, diarrhea and has been able to tolerate normal PO. Denies dizziness, headache, chest pain, SOB, fevers Significantly he typically drinks a 5th of hard alcohol per week but has not had any alcohol in over a week secondary to abdominal pain. He denies agitation, tremors or any other symptoms associated with cessation of etoh   Review Of Systems: Per HPI   Patient Active Problem List   Diagnosis Date Noted  . Hyperkalemia 12/08/2014  . Metabolic acidosis   . On statin therapy due to risk of future cardiovascular event 03/29/2014  .  Routine health maintenance 03/31/2013  . Tinea corporis 12/25/2011  . Hepatic encephalopathy (HCC) 09/03/2010  . Gastric ulcer 08/28/2010  . Hepatic steatosis 06/09/2010  . Bipolar disorder (HCC) 05/14/2010  . Hepatomegaly 05/12/2010  . ANEMIA, FOLIC ACID DEFICIENCY 08/02/2009  . LEG PAIN, BILATERAL 07/28/2009  . ABNORMALITY OF GAIT 07/28/2009  . Alcoholism (HCC)  07/11/2009  . WEIGHT LOSS, RECENT 07/11/2009  . ABDOMINAL PAIN OTHER SPECIFIED SITE 07/11/2009  . TRANSAMINASES, SERUM, ELEVATED 09/09/2008  . PROTEINURIA 08/07/2006  . HYPERTRIGLYCERIDEMIA 04/25/2006  . GOUT, CHRONIC 04/25/2006  . OBESITY, NOS 04/25/2006  . DEPRESSION, MAJOR, RECURRENT 04/25/2006  . TOBACCO DEPENDENCE 04/25/2006  . HYPERTENSION, BENIGN SYSTEMIC 04/25/2006  . Peptic ulcer 04/25/2006  . IMPOTENCE, ORGANIC 04/25/2006   Past Medical History: Past Medical History  Diagnosis Date  . Hypertension   . Diabetes mellitus    Past Surgical History: History reviewed. No pertinent past surgical history. Social History: Social History  Substance Use Topics  . Smoking status: Current Every Day Smoker -- 0.50 packs/day    Types: Cigarettes  . Smokeless tobacco: Never Used     Comment: does not want to quitt right now  . Alcohol Use: 8.4 oz/week    14 Cans of beer, 0 Shots of liquor per week   Additional social history: smokes 1/2 ppd, drinks 5th of liquir weekly, denies drug use Please also refer to relevant sections of EMR.  Family History: No family history on file. Allergies and Medications: Allergies  Allergen Reactions  . Ace Inhibitors     REACTION: angioedema  . Penicillins     REACTION: rash   No current facility-administered medications on file prior to encounter.   Current Outpatient Prescriptions on File Prior to Encounter  Medication Sig Dispense Refill  . allopurinol (ZYLOPRIM) 300 MG tablet Take 1 tablet (300 mg total) by mouth daily. 30 tablet 6  . aspirin 81 MG chewable tablet Chew 1 tablet (81 mg total) by mouth daily. 30 tablet 6  . atorvastatin (LIPITOR) 20 MG tablet Take 1 tablet (20 mg total) by mouth daily. 90 tablet 3  . folic acid (FOLVITE) 1 MG tablet Take 1 tablet (1 mg total) by mouth daily. 30 tablet 6  . lactulose (CHRONULAC) 10 GM/15ML solution Take 45 mLs (30 g total) by mouth daily. 1892 mL 11  . lithium (ESKALITH) 450 MG CR  tablet Take 1 tablet (450 mg total) by mouth daily. For mental health 30 tablet 6  . metoprolol (LOPRESSOR) 100 MG tablet Take 1 tablet (100 mg total) by mouth 2 (two) times daily. 60 tablet 6  . Multiple Vitamin (MULTIVITAMIN) capsule Take 1 capsule by mouth daily. 30 capsule 6  . omega-3 acid ethyl esters (LOVAZA) 1 G capsule Take 1 capsule (1 g total) by mouth daily. 30 capsule 6  . spironolactone (ALDACTONE) 50 MG tablet Take 1 tablet (50 mg total) by mouth daily. 30 tablet 5  . amLODipine (NORVASC) 10 MG tablet Take 1 tablet (10 mg total) by mouth daily. (Patient not taking: Reported on 12/08/2014) 30 tablet 6  . FLUoxetine (PROZAC) 40 MG capsule Take 1 capsule (40 mg total) by mouth daily. (Patient not taking: Reported on 12/08/2014) 30 capsule 6  . omeprazole (PRILOSEC) 40 MG capsule Take 1 capsule (40 mg total) by mouth daily. (Patient not taking: Reported on 12/08/2014) 60 capsule 6  . terbinafine (LAMISIL) 1 % cream Apply topically 2 (two) times daily. To affected areas for 2 to 3 weeks. (Patient not taking: Reported  on 03/26/2014) 30 g 0  . Thiamine HCl (VITAMIN B-1) 100 MG tablet Take 1 tablet (100 mg total) by mouth daily. (Patient not taking: Reported on 03/26/2014) 30 tablet 6    Objective: BP 107/70 mmHg  Pulse 68  Temp(Src) 97.6 F (36.4 C) (Oral)  Resp 16  Ht 6' (1.829 m)  Wt 234 lb (106.142 kg)  BMI 31.73 kg/m2  SpO2 97% Exam: General: Lying in bed, NAD HEENT: PERRL, NCAT, MMM Cardiovascular: RRR, no murmurs Respiratory: CTAB, normal WOB Abdomen: obese, soft, diffusely tender, no rebound or guarding, + BS Skin: No rashes or lesions noted Neuro: no focal deficits Psych: normal mood and affect  Labs and Imaging: CBC BMET   Recent Labs Lab 12/07/14 2004 12/08/14 0407  WBC 22.7*  --   HGB 14.0 11.9*  HCT 41.1 35.0*  PLT 242  --     Recent Labs Lab 12/07/14 2004 12/08/14 0407  NA 131* 134*  K 5.4* 5.8*  CL 105 115*  CO2 16*  --   BUN 31* 41*   CREATININE 1.30* 1.20  GLUCOSE 132* 96  CALCIUM 10.0  --      CT abd/pelvis  IMPRESSION: 2.2 cm diameter indeterminate mass in the midpole right kidney. Recommend followup with elective MRI to evaluate for solid mass versus hemorrhagic cyst. No evidence of bowel obstruction or inflammation.  Bonney Aid, MD 12/08/2014, 5:15 AM PGY-2, [facility] FPTS Intern pager: [phone], text pages welcome

## 2014-12-08 NOTE — ED Notes (Signed)
See downtime record

## 2014-12-08 NOTE — Consult Note (Signed)
[facility] Gastroenterology Consultation Note  Referring Provider: Dr. Levert Feinstein Primary Care Physician:  Mickie Hillier, MD  Reason for Consultation:  Abdominal pain, melena, hematochezia  HPI: [name] [name] is a 54 y.o. male admitted for several day history of melena and hematochezia.  Some diarrhea.  Mild epigastric abdominal pain.  No hematemesis.  Takes ASA 81 mg per day, denies other NSAIDs.  Endoscopy 2012 for melena and anemia showed antral ulcerations and mild distal erosive esophagitis.  No weight loss.  No prior colonoscopy.     Past Medical History  Diagnosis Date  . Hypertension   . Diabetes mellitus     History reviewed. No pertinent past surgical history.  Prior to Admission medications   Medication Sig Start Date End Date Taking? Authorizing Provider  allopurinol (ZYLOPRIM) 300 MG tablet Take 1 tablet (300 mg total) by mouth daily. 06/03/14  Yes Elenora Gamma, MD  aspirin 81 MG chewable tablet Chew 1 tablet (81 mg total) by mouth daily. 05/12/10  Yes Floydene Flock, MD  atorvastatin (LIPITOR) 20 MG tablet Take 1 tablet (20 mg total) by mouth daily. 03/29/14  Yes Elenora Gamma, MD  FLUoxetine (PROZAC) 20 MG capsule Take 20 mg by mouth at bedtime.   Yes Historical Provider, MD  folic acid (FOLVITE) 1 MG tablet Take 1 tablet (1 mg total) by mouth daily. 06/03/14  Yes Elenora Gamma, MD  lactulose (CHRONULAC) 10 GM/15ML solution Take 45 mLs (30 g total) by mouth daily. 12/06/14  Yes Kathee Delton, MD  lithium (ESKALITH) 450 MG CR tablet Take 1 tablet (450 mg total) by mouth daily. For mental health 05/12/10  Yes Floydene Flock, MD  metoprolol (LOPRESSOR) 100 MG tablet Take 1 tablet (100 mg total) by mouth 2 (two) times daily. 06/03/14  Yes Elenora Gamma, MD  Multiple Vitamin (MULTIVITAMIN) capsule Take 1 capsule by mouth daily. 05/12/10  Yes Floydene Flock, MD  omega-3 acid ethyl esters (LOVAZA) 1 G capsule Take 1 capsule (1 g total) by mouth daily. 06/03/14  Yes Elenora Gamma, MD  spironolactone (ALDACTONE) 50 MG tablet Take 1 tablet (50 mg total) by mouth daily. 06/03/14  Yes Elenora Gamma, MD  amLODipine (NORVASC) 10 MG tablet Take 1 tablet (10 mg total) by mouth daily. Patient not taking: Reported on 12/08/2014 05/12/10   Floydene Flock, MD  FLUoxetine (PROZAC) 40 MG capsule Take 1 capsule (40 mg total) by mouth daily. Patient not taking: Reported on 12/08/2014 05/12/10   Floydene Flock, MD  omeprazole (PRILOSEC) 40 MG capsule Take 1 capsule (40 mg total) by mouth daily. Patient not taking: Reported on 12/08/2014 05/21/11   Floydene Flock, MD  terbinafine (LAMISIL) 1 % cream Apply topically 2 (two) times daily. To affected areas for 2 to 3 weeks. Patient not taking: Reported on 03/26/2014 12/25/11   Elenora Gamma, MD  Thiamine HCl (VITAMIN B-1) 100 MG tablet Take 1 tablet (100 mg total) by mouth daily. Patient not taking: Reported on 03/26/2014 05/12/10   Floydene Flock, MD    Current Facility-Administered Medications  Medication Dose Route Frequency Provider Last Rate Last Dose  . 0.9 %  sodium chloride infusion   Intravenous Continuous Kristen N Ward, DO 125 mL/hr at 12/08/14 0449    . acetaminophen (TYLENOL) tablet 650 mg  650 mg Oral Q6H PRN Bonney Aid, MD       Or  . acetaminophen (TYLENOL) suppository 650 mg  650  mg Rectal Q6H PRN Bonney AidAlyssa A Haney, MD      . allopurinol (ZYLOPRIM) tablet 300 mg  300 mg Oral Daily Bonney AidAlyssa A Haney, MD   300 mg at 12/08/14 1004  . amLODipine (NORVASC) tablet 10 mg  10 mg Oral Daily Bonney AidAlyssa A Haney, MD   10 mg at 12/08/14 1003  . atorvastatin (LIPITOR) tablet 20 mg  20 mg Oral Daily Bonney AidAlyssa A Haney, MD   20 mg at 12/08/14 1003  . FLUoxetine (PROZAC) capsule 40 mg  40 mg Oral Daily Bonney AidAlyssa A Haney, MD   40 mg at 12/08/14 1004  . folic acid (FOLVITE) tablet 1 mg  1 mg Oral Daily Bonney AidAlyssa A Haney, MD   1 mg at 12/08/14 1010  . HYDROmorphone (DILAUDID) 1 MG/ML injection           . lactulose (CHRONULAC) 10 GM/15ML  solution 30 g  30 g Oral Daily Bonney AidAlyssa A Haney, MD   30 g at 12/08/14 1009  . lithium carbonate (ESKALITH) CR tablet 450 mg  450 mg Oral Daily Bonney AidAlyssa A Haney, MD   450 mg at 12/08/14 1004  . LORazepam (ATIVAN) injection 0-4 mg  0-4 mg Intravenous Q6H Bonney AidAlyssa A Haney, MD   Stopped at 12/08/14 0615   Followed by  . [START ON 12/10/2014] LORazepam (ATIVAN) injection 0-4 mg  0-4 mg Intravenous Q12H Alyssa A Haney, MD      . LORazepam (ATIVAN) tablet 1 mg  1 mg Oral Q6H PRN Bonney AidAlyssa A Haney, MD       Or  . LORazepam (ATIVAN) injection 1 mg  1 mg Intravenous Q6H PRN Bonney AidAlyssa A Haney, MD      . metoprolol (LOPRESSOR) tablet 100 mg  100 mg Oral BID Bonney AidAlyssa A Haney, MD   100 mg at 12/08/14 1003  . multivitamin with minerals tablet 1 tablet  1 tablet Oral Daily Bonney AidAlyssa A Haney, MD   1 tablet at 12/08/14 1004  . nicotine (NICODERM CQ - dosed in mg/24 hours) patch 14 mg  14 mg Transdermal Daily Bonney AidAlyssa A Haney, MD   14 mg at 12/08/14 1005  . pantoprazole (PROTONIX) injection 40 mg  40 mg Intravenous Q12H Bonney AidAlyssa A Haney, MD   40 mg at 12/08/14 1002  . spironolactone (ALDACTONE) tablet 50 mg  50 mg Oral Daily Bonney AidAlyssa A Haney, MD   50 mg at 12/08/14 1004  . thiamine (VITAMIN B-1) tablet 100 mg  100 mg Oral Daily Bonney AidAlyssa A Haney, MD   100 mg at 12/08/14 1008    Allergies as of 12/07/2014 - Review Complete 12/07/2014  Allergen Reaction Noted  . Ace inhibitors  06/02/2008  . Penicillins  03/14/2005    No family history on file.  Social History   Social History  . Marital Status: Married    Spouse Name: N/A  . Number of Children: N/A  . Years of Education: N/A   Occupational History  . Not on file.   Social History Main Topics  . Smoking status: Current Every Day Smoker -- 0.50 packs/day    Types: Cigarettes  . Smokeless tobacco: Never Used     Comment: does not want to quitt right now  . Alcohol Use: 8.4 oz/week    14 Cans of beer, 0 Shots of liquor per week  . Drug Use: No  . Sexual Activity: Not on  file   Other Topics Concern  . Not on file   Social History Narrative    Review of  Systems: As Per HPI, all others negative.  Physical Exam: Vital signs in last 24 hours: Temp:  [97.6 F (36.4 C)-97.8 F (36.6 C)] 97.8 F (36.6 C) (10/12 1105) Pulse Rate:  [62-82] 62 (10/12 1105) Resp:  [14-22] 16 (10/12 1105) BP: (104-151)/(56-91) 104/72 mmHg (10/12 1105) SpO2:  [89 %-100 %] 98 % (10/12 1105) Weight:  [106.142 kg (234 lb)-108 kg (238 lb 1.6 oz)] 108 kg (238 lb 1.6 oz) (10/12 1105)   General:   Alert,  Obese, chronically ill-appearing, Well-developed, well-nourished, pleasant and cooperative in NAD Head:  Normocephalic and atraumatic. Eyes:  Sclera clear, no icterus.   Conjunctiva slightly pale-appearing Ears:  Normal auditory acuity. Nose:  No deformity, discharge,  or lesions. Mouth:  No deformity or lesions.  Oropharynx pink & moist. Neck:  Supple; no masses or thyromegaly. Lungs:  Clear throughout to auscultation.   No wheezes, crackles, or rhonchi. No acute distress. Heart:  Regular rate and rhythm; no murmurs, clicks, rubs,  or gallops. Abdomen:  Soft, protuberant, mild epigastric tenderness, nondistended. No masses, hepatosplenomegaly or hernias noted. Normal bowel sounds, without guarding, and without rebound.     Msk:  Symmetrical without gross deformities. Normal posture. Pulses:  Normal pulses noted. Extremities:  Without clubbing or edema. Neurologic:  Alert and  oriented x4;  Diffusely weak, otherwise grossly normal neurologically. Skin:  Intact without significant lesions or rashes. Psych:  Alert and cooperative. Normal mood and affect.   Lab Results:  Recent Labs  12/07/14 2004 12/08/14 0407 12/08/14 0600  WBC 22.7*  --  15.8*  HGB 14.0 11.9* 12.3*  HCT 41.1 35.0* 37.0*  PLT 242  --  193   BMET  Recent Labs  12/07/14 2004 12/08/14 0407 12/08/14 0600  NA 131* 134* 134*  K 5.4* 5.8* 4.8  CL 105 115* 110  CO2 16*  --  16*  GLUCOSE 132* 96  107*  BUN 31* 41* 26*  CREATININE 1.30* 1.20 1.24  CALCIUM 10.0  --  8.5*   LFT  Recent Labs  12/07/14 2004  PROT 8.0  ALBUMIN 3.9  AST 21  ALT 31  ALKPHOS 80  BILITOT 0.8   PT/INR  Recent Labs  12/08/14 0600  LABPROT 14.7  INR 1.13    Studies/Results: Ct Abdomen Pelvis W Contrast  12/08/2014  CLINICAL DATA:  Bilateral upper abdominal pain for 1 week. Nausea and vomiting. Blood in stool. EXAM: CT ABDOMEN AND PELVIS WITH CONTRAST TECHNIQUE: Multidetector CT imaging of the abdomen and pelvis was performed using the standard protocol following bolus administration of intravenous contrast. CONTRAST:  100 mL Omnipaque 300 COMPARISON:  Ultrasound abdomen 05/19/2010 FINDINGS: Fibrosis or dependent atelectasis in the lung bases. Multiple old left rib fractures. Coronary artery calcifications. Sub cm low-attenuation lesion in the medial segment left lobe of the liver along the surface consistent with a small cyst. No other focal lesions identified in the liver. Gallbladder, spleen, pancreas, inferior vena cava, and retroperitoneal lymph nodes are unremarkable. Calcification of the abdominal aorta without aneurysm. Low-attenuation lesions in the right kidney consistent with cysts. Additional indeterminate low-attenuation lesion in the midpole of the right kidney with suggestion of internal enhancement versus hemorrhage measuring 2.2 cm diameter. Suggest further evaluation with elective MRI to exclude solid lesion versus hemorrhagic cyst. No hydronephrosis in either kidney. Renal nephrograms are symmetrical. Diffuse enlargement of the left adrenal gland likely due to hyperplasia. Stomach and small bowel are decompressed. Scattered stool in the colon without abnormal distention. No free air or free  fluid in the abdomen. Small accessory spleen. Pelvis: Normal appearance of the appendix. Bladder wall is not thickened. Prostate gland is not enlarged. Scattered diverticula in the sigmoid colon without  evidence of diverticulitis. No free or loculated pelvic fluid collections. No pelvic mass or lymphadenopathy. No destructive bone lesions. IMPRESSION: 2.2 cm diameter indeterminate mass in the midpole right kidney. Recommend followup with elective MRI to evaluate for solid mass versus hemorrhagic cyst. No evidence of bowel obstruction or inflammation. Electronically Signed   By: Burman Nieves M.D.   On: 12/08/2014 02:32    Impression:  1.  Epigastric pain. 2.  History gastric ulcers. 3.  Blood in stool, melena and hematochezia. 4.  Mild anemia.  Plan:  1.  PPI. 2.  Endoscopy tomorrow, unless patient has rampant bleeding today. 3.  Clear liquids ok, NPO after midnight. 4.  If endoscopy is unrevealing, would consider colonoscopy for further evaluation.   LOS: 0 days   Alayla Dethlefs M  12/08/2014, 11:53 AM  Pager [phone] If no answer or after 5 PM call [phone]

## 2014-12-09 ENCOUNTER — Encounter (HOSPITAL_COMMUNITY): Payer: Self-pay | Admitting: *Deleted

## 2014-12-09 ENCOUNTER — Inpatient Hospital Stay (HOSPITAL_COMMUNITY): Payer: BLUE CROSS/BLUE SHIELD | Admitting: Anesthesiology

## 2014-12-09 ENCOUNTER — Encounter (HOSPITAL_COMMUNITY)
Admission: EM | Disposition: A | Discharge status: Home / Self Care | Payer: Self-pay | Source: Home / Self Care | Attending: Family Medicine

## 2014-12-09 DIAGNOSIS — R109 Unspecified abdominal pain: Secondary | ICD-10-CM

## 2014-12-09 DIAGNOSIS — K264 Chronic or unspecified duodenal ulcer with hemorrhage: Principal | ICD-10-CM

## 2014-12-09 HISTORY — PX: ESOPHAGOGASTRODUODENOSCOPY (EGD) WITH PROPOFOL: SHX5813

## 2014-12-09 LAB — CBC
HCT: 33.6 % — ABNORMAL LOW (ref 39.0–52.0)
Hemoglobin: 11 g/dL — ABNORMAL LOW (ref 13.0–17.0)
MCH: 34.3 pg — ABNORMAL HIGH (ref 26.0–34.0)
MCHC: 32.7 g/dL (ref 30.0–36.0)
MCV: 104.7 fL — ABNORMAL HIGH (ref 78.0–100.0)
Platelets: 208 10*3/uL (ref 150–400)
RBC: 3.21 MIL/uL — ABNORMAL LOW (ref 4.22–5.81)
RDW: 13.5 % (ref 11.5–15.5)
WBC: 12.4 10*3/uL — ABNORMAL HIGH (ref 4.0–10.5)

## 2014-12-09 LAB — BASIC METABOLIC PANEL
Anion gap: 9 (ref 5–15)
BUN: 15 mg/dL (ref 6–20)
CALCIUM: 8.8 mg/dL — AB (ref 8.9–10.3)
CO2: 20 mmol/L — ABNORMAL LOW (ref 22–32)
CREATININE: 1.12 mg/dL (ref 0.61–1.24)
Chloride: 112 mmol/L — ABNORMAL HIGH (ref 101–111)
GFR calc non Af Amer: >60 mL/min (ref >60)
Glucose, Bld: 90 mg/dL (ref 65–99)
Potassium: 4.5 mmol/L (ref 3.5–5.1)
SODIUM: 141 mmol/L (ref 135–145)

## 2014-12-09 SURGERY — ESOPHAGOGASTRODUODENOSCOPY (EGD) WITH PROPOFOL
Anesthesia: Monitor Anesthesia Care | Laterality: Left

## 2014-12-09 MED ORDER — PROMETHAZINE HCL 25 MG/ML IJ SOLN: 6.2500 mg | INTRAMUSCULAR | Status: DC | PRN

## 2014-12-09 MED ORDER — TRAMADOL HCL 50 MG PO TABS: 50.0000 mg | ORAL_TABLET | Freq: Four times a day (QID) | ORAL | Status: DC | PRN

## 2014-12-09 MED ORDER — SODIUM CHLORIDE 0.9 % IV SOLN: INTRAVENOUS | Status: DC

## 2014-12-09 MED ORDER — FENTANYL CITRATE (PF) 100 MCG/2ML IJ SOLN: 25.0000 ug | INTRAMUSCULAR | Status: DC | PRN

## 2014-12-09 MED ORDER — PROPOFOL 500 MG/50ML IV EMUL
INTRAVENOUS | Status: DC | PRN
  Administered 2014-12-09: 75 ug/kg/min via INTRAVENOUS

## 2014-12-09 MED ORDER — PROPOFOL 10 MG/ML IV BOLUS
INTRAVENOUS | Status: DC | PRN
  Administered 2014-12-09 (×4): 20 mg via INTRAVENOUS

## 2014-12-09 MED ORDER — LIDOCAINE HCL (CARDIAC) 20 MG/ML IV SOLN
INTRAVENOUS | Status: DC | PRN
  Administered 2014-12-09: 30 mg via INTRAVENOUS

## 2014-12-09 MED ORDER — SUCRALFATE 1 G PO TABS
1.0000 g | ORAL_TABLET | Freq: Four times a day (QID) | ORAL | Status: DC
  Administered 2014-12-09 – 2014-12-10 (×4): 1 g via ORAL
  Filled 2014-12-09 (×4): qty 1

## 2014-12-09 NOTE — Progress Notes (Signed)
Family Medicine Teaching Service Daily Progress Note Intern Pager: [phone]  Patient name: [name] [name] Medical record number: [id number] Date of birth: [birth year] Age: 54 y.o. Gender: male  Primary Care Provider: Mickie Hillier, MD Consultants: GI Code Status: DNR  Pt Overview and Major Events to Date:  -10/12 admitted with melanotic stool and abdominal pain  Assessment and Plan: [name] [name] is a 54 y.o. male presenting with abdominal pain and dark stools . PMH is significant for H/o peptic ulcer, HTN. Tobacco abuse, alcohol abuse, depression, bipolar disorder, H/o hepatic encephalopathy  Abdominal pain/Dark stools: Given history of prior gastric ulcer on EGD in 2012 and now with diffuse abd pain and dark stools, FOBT + in ED, concern for recurrent ulcer with potentially upper GI bleed. Reports of some bright red blood per rectum concerning for lower GI bleed.  Currently hemodynamically stable with last dark stool yesterday morning. - Admit to telemetry -GI consulted and appreciate recs:  -EGD today  -Colonoscopy if EGD unrevealing - bilateral large bore IVs - IV protonix 40 mg BID - on aspirin 81 at home, will hold for now  Hyperkalemia:  Resolved. Likely secondary to home spironolactone.  Etoh Abuse- no evidence of withdrawal. CIWA score 0 over night. - Continue CIWA - continue folic acid, thiamine supp  Tobacco abuse- Smokes 1/2 ppd - nicotine patch  Right renal Mass on Abd CT: Measures 2.2 cm, potentially visualized on abd [place] 04/2010, if this represent the same lesion, it was 1.7 cm at that time - Will need outpatient work up with likely further imaging with MRI  HTN- Stable - continue home norvasc, lopressor, spironolactone after EGD  Bipolar disorder- Lithium level 0.73  - continue home lithium  Depression-  - continue home Prozac  Gout- on allopurinol as outpatient - continue allopurinol  H/o hepatic encephalopathy- no evidence of encephalopathy at this  time, LFTS wnl. CT abdomen not revealing for cirrhosis. - continue home lactulose - f/u INR - consider ammonia level if concerned  HLD - continue home lipitor  FEN/GI: NPO, MIVF Prophylaxis: SCDs for ppx, no medical VTE ppx due to GI bleed  Subjective:  Reports having peaceful night. Denies abdominal pain. Didn't have BM since yesterday morning. Denies dizziness when he got up and went to bath room.   Objective: Temp:  [97.5 F (36.4 C)-98.4 F (36.9 C)] 97.9 F (36.6 C) (10/13 1302) Pulse Rate:  [62-95] 70 (10/13 1302) Resp:  [16-20] 20 (10/13 1302) BP: (105-137)/(61-85) 137/85 mmHg (10/13 1302) SpO2:  [94 %-100 %] 100 % (10/13 1302) Physical Exam: Gen: NAD, well-appearing  Eyes: PERRLA, sclera anicteric, no conjunctival injection Nares: clear, no erythema, swelling or congestion Oropharynx: clear, moist CV: RRR. S1 & S2 audible, no murmurs Resp: no apparent WOB, CTAB. Abd: +BS. Soft, NDNT, no rebound or guarding.  Ext: No edema or gross deformities. Neuro: Alert and oriented, No gross focal deficits  Laboratory:  Recent Labs Lab 12/07/14 2004 12/08/14 0407 12/08/14 0600 12/09/14 0623  WBC 22.7*  --  15.8* 12.4*  HGB 14.0 11.9* 12.3* 11.0*  HCT 41.1 35.0* 37.0* 33.6*  PLT 242  --  193 208    Recent Labs Lab 12/07/14 2004 12/08/14 0407 12/08/14 0600 12/09/14 0623  NA 131* 134* 134* 141  K 5.4* 5.8* 4.8 4.5  CL 105 115* 110 112*  CO2 16*  --  16* 20*  BUN 31* 41* 26* 15  CREATININE 1.30* 1.20 1.24 1.12  CALCIUM 10.0  --  8.5* 8.8*  PROT 8.0  --   --   --   BILITOT 0.8  --   --   --   ALKPHOS 80  --   --   --   ALT 31  --   --   --   AST 21  --   --   --   GLUCOSE 132* 96 107* 90   Imaging/Diagnostic Tests: No results found.  Almon Herculesaye T Coulton Schlink, MD 12/09/2014, 1:41 PM PGY-1, [facility] FPTS Intern pager: [phone], text pages welcome

## 2014-12-09 NOTE — Anesthesia Postprocedure Evaluation (Signed)
  Anesthesia Post-op Note  Patient: [name] Forestnthony L Fluellen  Procedure(s) Performed: Procedure(s): ESOPHAGOGASTRODUODENOSCOPY (EGD) WITH PROPOFOL (Left)  Patient Location: PACU  Anesthesia Type:MAC  Level of Consciousness: awake  Airway and Oxygen Therapy: Patient Spontanous Breathing  Post-op Pain: mild  Post-op Assessment: Post-op Vital signs reviewed              Post-op Vital Signs: Reviewed  Last Vitals:  Filed Vitals:   12/09/14 1440  BP: 105/57  Pulse: 65  Temp: 36.6 C  Resp: 16    Complications: No apparent anesthesia complications

## 2014-12-09 NOTE — Transfer of Care (Signed)
Immediate Anesthesia Transfer of Care Note  Patient: [name] [name]  Procedure(s) Performed: Procedure(s): ESOPHAGOGASTRODUODENOSCOPY (EGD) WITH PROPOFOL (Left)  Patient Location: Endoscopy Unit  Anesthesia Type:MAC  Level of Consciousness: awake and alert   Airway & Oxygen Therapy: Patient Spontanous Breathing and Patient connected to nasal cannula oxygen  Post-op Assessment: Report given to RN and Post -op Vital signs reviewed and stable  Post vital signs: Reviewed and stable  Last Vitals:  Filed Vitals:   12/09/14 1440  BP: 105/57  Pulse: 65  Temp: 36.6 C  Resp: 16    Complications: No apparent anesthesia complications

## 2014-12-09 NOTE — Progress Notes (Signed)
Please see patient's endoscopy report for findings and recommendations.  Although pantoprazole was listed as a home medication prior to admission, the patient is not aware of taking that medication, so I suspect he was not really on it. Otherwise, the recurrence of ulcer disease while on PPI therapy would be quite concerning.  I think discharge tomorrow would be appropriate if he remains stable.  Florencia Reasonsobert V. Sinclair Arrazola, M.D. Pager [phone] If no answer or after 5 PM call [phone]

## 2014-12-09 NOTE — Op Note (Signed)
[name] [address] [place], [id number]   ENDOSCOPY PROCEDURE REPORT  PATIENT: [name] [name], [name] [name]  MR#: [id number] BIRTHDATE: 20-May-1960 , 54  yrs. old GENDER: male ENDOSCOPIST:Shail Urbas, MD [facility] PROCEDURE DATE:  12/09/2014 PROCEDURE:   upper endoscopy with biopsy ASA CLASS:    III INDICATIONS: melena and hematochezia in a heavy drinker on aspirin, with a prior history of gastric ulcers and esophagitis on endoscopy 3 years ago MEDICATION:  MAC--propofol per anesthesia TOPICAL ANESTHETIC:   none  DESCRIPTION OF PROCEDURE:   After the risks and benefits of the procedure were explained, informed consent was obtained. The patient was [facility] his hospital [facility].  The Pentax video upper       endoscope was introduced through the mouth  and advanced to the second portion of the duodenum .  The instrument was slowly withdrawn as the mucosa was fully examined. Estimated blood loss is zero unless otherwise noted in this procedure report.    The larynx was not well seen.  the esophagus was entered under direct vision. The distal esophagus had a "weather-beaten" appearance with some erythema and slight deformity, but not any erosions, ulcerations, masses, or Barrett's changes. a 2 cm hiatal hernia was present..  The stomach was entered. It contained a minimal bilious residual. No gastritis, erosions, gastric ulcers, polyps, or masses were observed including a retroflexed view of the cardia, which had a normal appearance.  The pylorus looked normal.  However, the duodenal bulb had a 1.5 cm, moderately deep but clean-based ulcer on the anterolateral inferior aspect of the bulb. The second portion of the duodenum looked normal.  Antral biopsies were obtained to look for evidence of Helicobacter pylori infection.  The scope was then withdrawn from the patient and the  procedure completed.  COMPLICATIONS: There were no immediate complications.  ENDOSCOPIC IMPRESSION: 1. No active bleeding or blood in the stomach at the time this exam 2. Active, but clean-based, duodenal ulcer which presumably accounts for the patient's recent GI bleeding 3. Small hiatal hernia. Moderate chronic nonerosive esophagitis.   RECOMMENDATIONS: 1. This ulcer appears to be at low risk for bleeding. If the patient is stable overnight, I think discharge tomorrow would be reasonable 2. Okay to initiate solid food for his diet (I will order) 3. Await pathology on biopsies; if Helicobacter pylori infection is present, it would be appropriate to treat it 4. I would favor sucralfate 4 times daily for the next week to facilitate adjunctive ulcer healing, while continuing PPI therapy for one month 5. If the patient is going to remain on aspirin, I would recommend ongoing PPI prophylaxis to prevent future ulcers 6. No follow-up endoscopy is required, since this is a duodenal and not a gastric ulcer 7. However, the patient needs colon cancer screening and we will contact him with his biopsy results and arrange colonoscopy as an outpatient   _______________________________ eSigned:  Bernette Redbirdobert Satsuki Zillmer, MD 12/09/2014 2:46 PM     cc:  CPT CODES: ICD CODES:  The ICD and CPT codes recommended by this software are interpretations from the data that the clinical staff has captured with the software.  The verification of the translation of this report to the ICD and CPT codes and modifiers is the sole responsibility of the health care institution and practicing physician where this report was generated.  [employer] will not be held responsible for the  validity of the ICD and CPT codes included on this report.  AMA assumes no liability for data contained or not contained herein. CPT is a Publishing rights manager of the [employer].  PATIENT NAME:   [name] [name], [name] [name] MR#: [id number]

## 2014-12-09 NOTE — Anesthesia Preprocedure Evaluation (Addendum)
Anesthesia Evaluation  Patient identified by MRN, date of birth, ID band Patient awake    Reviewed: Allergy & Precautions, NPO status   Airway Mallampati: II  TM Distance: >3 FB Neck ROM: Full    Dental  (+) Dental Advisory Given, Teeth Intact   Pulmonary Current Smoker,    breath sounds clear to auscultation       Cardiovascular hypertension,  Rhythm:Regular Rate:Normal     Neuro/Psych    GI/Hepatic Neg liver ROS, PUD,   Endo/Other  diabetes  Renal/GU negative Renal ROS     Musculoskeletal   Abdominal   Peds  Hematology   Anesthesia Other Findings   Reproductive/Obstetrics                            Anesthesia Physical Anesthesia Plan  ASA: III  Anesthesia Plan: MAC   Post-op Pain Management:    Induction: Intravenous  Airway Management Planned: Simple Face Mask  Additional Equipment:   Intra-op Plan:   Post-operative Plan:   Informed Consent: I have reviewed the patients History and Physical, chart, labs and discussed the procedure including the risks, benefits and alternatives for the proposed anesthesia with the patient or authorized representative who has indicated his/her understanding and acceptance.   Dental advisory given  Plan Discussed with: CRNA and Anesthesiologist  Anesthesia Plan Comments:         Anesthesia Quick Evaluation

## 2014-12-10 ENCOUNTER — Encounter (HOSPITAL_COMMUNITY): Payer: Self-pay | Admitting: Gastroenterology

## 2014-12-10 LAB — CBC
HCT: 36 % — ABNORMAL LOW (ref 39.0–52.0)
Hemoglobin: 11.3 g/dL — ABNORMAL LOW (ref 13.0–17.0)
MCH: 32.8 pg (ref 26.0–34.0)
MCHC: 31.4 g/dL (ref 30.0–36.0)
MCV: 104.3 fL — AB (ref 78.0–100.0)
PLATELETS: 215 10*3/uL (ref 150–400)
RBC: 3.45 MIL/uL — ABNORMAL LOW (ref 4.22–5.81)
RDW: 13.4 % (ref 11.5–15.5)
WBC: 11.9 10*3/uL — ABNORMAL HIGH (ref 4.0–10.5)

## 2014-12-10 LAB — BASIC METABOLIC PANEL
Anion gap: 8 (ref 5–15)
BUN: 11 mg/dL (ref 6–20)
CO2: 17 mmol/L — ABNORMAL LOW (ref 22–32)
CREATININE: 1.08 mg/dL (ref 0.61–1.24)
Calcium: 8.5 mg/dL — ABNORMAL LOW (ref 8.9–10.3)
Chloride: 113 mmol/L — ABNORMAL HIGH (ref 101–111)
GFR calc Af Amer: >60 mL/min (ref >60)
GLUCOSE: 87 mg/dL (ref 65–99)
Potassium: 4.6 mmol/L (ref 3.5–5.1)
SODIUM: 138 mmol/L (ref 135–145)

## 2014-12-10 MED ORDER — NICOTINE 14 MG/24HR TD PT24: 14.0000 mg | MEDICATED_PATCH | Freq: Every day | TRANSDERMAL | Status: DC

## 2014-12-10 NOTE — Progress Notes (Signed)
Nursing staff went in patient room for discharge instructions, but patient left the room without informing staff. Patient didn't received the paper D/C and also he didn't have removed his IVs (on RFA and LFA). MD was informed about this and also security was called. Charge nurse called the family and left a message for [employer]aron Sugg.

## 2014-12-10 NOTE — Discharge Summary (Signed)
[facility]  Patient name: [name] [name] Medical record number: [id number] Date of birth: 08/01/1960 Age: 54 y.o. Gender: male Date of Admission: 12/07/2014  Date of Discharge: 12/10/2014 Admitting Physician: Latrelle DodrillBrittany J McIntyre, MD  Primary Care Provider: Mickie HillierIan McKeag, MD Consultants: GI  Indication for Hospitalization: GI bleeding  Discharge Diagnoses/Problem List: Duodenal Ulcer Patient Active Problem List   Diagnosis Date Noted  . Gastrointestinal hemorrhage associated with duodenal ulcer   . Hyperkalemia 12/08/2014  . Abdominal pain 12/08/2014  . Metabolic acidosis   . On statin therapy due to risk of future cardiovascular event 03/29/2014  . Routine health maintenance 03/31/2013  . Tinea corporis 12/25/2011  . Hepatic encephalopathy (HCC) 09/03/2010  . Gastric ulcer 08/28/2010  . Hepatic steatosis 06/09/2010  . Bipolar disorder (HCC) 05/14/2010  . Hepatomegaly 05/12/2010  . ANEMIA, FOLIC ACID DEFICIENCY 08/02/2009  . LEG PAIN, BILATERAL 07/28/2009  . ABNORMALITY OF GAIT 07/28/2009  . Alcoholism (HCC) 07/11/2009  . WEIGHT LOSS, RECENT 07/11/2009  . ABDOMINAL PAIN OTHER SPECIFIED SITE 07/11/2009  . TRANSAMINASES, SERUM, ELEVATED 09/09/2008  . PROTEINURIA 08/07/2006  . HYPERTRIGLYCERIDEMIA 04/25/2006  . GOUT, CHRONIC 04/25/2006  . OBESITY, NOS 04/25/2006  . DEPRESSION, MAJOR, RECURRENT 04/25/2006  . TOBACCO DEPENDENCE 04/25/2006  . HYPERTENSION, BENIGN SYSTEMIC 04/25/2006  . Peptic ulcer 04/25/2006  . IMPOTENCE, ORGANIC 04/25/2006   Disposition: home  Discharge Condition: stable  Discharge Exam: Gen: NAD, well-appearing  Eyes: PERRLA, sclera anicteric, no conjunctival injection Nares: clear, no erythema, swelling or congestion Oropharynx: clear, moist CV: RRR. S1 & S2 audible, no murmurs Resp: no apparent WOB, CTAB. Abd: +BS. Soft, NDNT, no rebound or guarding.  Ext: No edema or gross deformities. Neuro:  Alert and oriented, No gross focal deficits  Brief Hospital Course:  [name] [name] is a 54 y.o. male presenting with abdominal pain and dark stools . PMH is significant for H/o peptic ulcer on EGD in 2012, HTN. Tobacco abuse, alcohol abuse, depression, bipolar disorder, H/o hepatic encephalopathy  Abdominal pain/Dark stools: diffuse abd pain and dark stools, FOBT + in ED, concern for recurrent ulcer with potentially upper GI bleed. Reports of some bright red blood per rectum concerning for lower GI bleed as well. EGD on 10/13 significant for duodenal ulcer with clean base. hemodynamically stable throughout his hospitalization. Hemoglobin stable around 11.   Renal mass: incidental finding. CT abdomen obtained as patient had metabolic alkalosis with hyperkalemia on presentation, and showed 2.2 cm diameter indeterminate mass in the midpole right kidney. He had 2 small right renal cysts, largest in the upper pole measuring 1.7 cm on abdominal US in 2012. Not sure if this is the same or not. Recommend follow up MRI as an outpatient.   Other chronic conditions were stable.  Issues for Follow Up:  1. GI bleeding: recommend checking CBC on follow up with PCP 2. Incidental renal mass: PCP can follow up this with MRI  Significant Procedures: EGD  Significant Labs and Imaging:   Recent Labs Lab 12/08/14 0600 12/09/14 0623 12/10/14 0519  WBC 15.8* 12.4* 11.9*  HGB 12.3* 11.0* 11.3*  HCT 37.0* 33.6* 36.0*  PLT 193 208 215    Recent Labs Lab 12/07/14 2004 12/08/14 0407 12/08/14 0600 12/09/14 0623 12/10/14 0519  NA 131* 134* 134* 141 138  K 5.4* 5.8* 4.8 4.5 4.6  CL 105 115* 110 112* 113*  CO2 16*  --  16* 20* 17*  GLUCOSE 132* 96 107* 90 87  BUN 31* 41*  26* 15 11  CREATININE 1.30* 1.20 1.24 1.12 1.08  CALCIUM 10.0  --  8.5* 8.8* 8.5*  ALKPHOS 80  --   --   --   --   AST 21  --   --   --   --   ALT 31  --   --   --   --   ALBUMIN 3.9  --   --   --   --     Results/Tests  Pending at Time of Discharge: none  Discharge Medications:    Medication List    STOP taking these medications        terbinafine 1 % cream  Commonly known as:  LAMISIL      TAKE these medications        allopurinol 300 MG tablet  Commonly known as:  ZYLOPRIM  Take 1 tablet (300 mg total) by mouth daily.     amLODipine 10 MG tablet  Commonly known as:  NORVASC  Take 1 tablet (10 mg total) by mouth daily.     aspirin 81 MG chewable tablet  Chew 1 tablet (81 mg total) by mouth daily.     atorvastatin 20 MG tablet  Commonly known as:  LIPITOR  Take 1 tablet (20 mg total) by mouth daily.     FLUoxetine 20 MG capsule  Commonly known as:  PROZAC  Take 20 mg by mouth at bedtime.     folic acid 1 MG tablet  Commonly known as:  FOLVITE  Take 1 tablet (1 mg total) by mouth daily.     lactulose 10 GM/15ML solution  Commonly known as:  CHRONULAC  Take 45 mLs (30 g total) by mouth daily.     lithium carbonate 450 MG CR tablet  Commonly known as:  ESKALITH  Take 1 tablet (450 mg total) by mouth daily. For mental health     metoprolol 100 MG tablet  Commonly known as:  LOPRESSOR  Take 1 tablet (100 mg total) by mouth 2 (two) times daily.     multivitamin capsule  Take 1 capsule by mouth daily.     nicotine 14 mg/24hr patch  Commonly known as:  NICODERM CQ - dosed in mg/24 hours  Place 1 patch (14 mg total) onto the skin daily.     omega-3 acid ethyl esters 1 G capsule  Commonly known as:  LOVAZA  Take 1 capsule (1 g total) by mouth daily.     omeprazole 40 MG capsule  Commonly known as:  PRILOSEC  Take 1 capsule (40 mg total) by mouth daily.     spironolactone 50 MG tablet  Commonly known as:  ALDACTONE  Take 1 tablet (50 mg total) by mouth daily.     thiamine 100 MG tablet  Commonly known as:  VITAMIN B-1  Take 1 tablet (100 mg total) by mouth daily.        Discharge Instructions: Please refer to Patient Instructions section of EMR for full details.   Patient was counseled important signs and symptoms that should prompt return to medical care, changes in medications, dietary instructions, activity restrictions, and follow up appointments.   Follow-Up Appointments: Follow-up Information    Go to Mickie Hillier, MD.   Specialty:  Family Medicine   Why:  at 2:30 pm   Contact information:   [address] [place] [address] [phone]       Almon Hercules, MD 12/10/2014, 5:07 PM PGY-1, [facility]

## 2014-12-10 NOTE — Care Management Note (Signed)
Case Management Note  Patient Details  Name: Jacobo Forestnthony L Balaban MRN: [id number] Date of Birth: 06/07/1960  Subjective/Objective:                 Patient admitted form home, independent. No CM needs identified.    Action/Plan:  Will DC to home today, self care.   Expected Discharge Date:                  Expected Discharge Plan:  Home/Self Care  In-House Referral:     Discharge planning Services  CM Consult  Post Acute Care Choice:    Choice offered to:     DME Arranged:    DME Agency:     HH Arranged:    HH Agency:     Status of Service:  Completed, signed off  Medicare Important Message Given:    Date Medicare IM Given:    Medicare IM give by:    Date Additional Medicare IM Given:    Additional Medicare Important Message give by:     If discussed at Long Length of Stay Meetings, dates discussed:    Additional Comments:  Lawerance SabalDebbie Conard Alvira, RN 12/10/2014, 1:52 PM

## 2014-12-10 NOTE — Progress Notes (Signed)
Please see yesterday's dictated endoscopy report for recommendations.  Patient okay for discharge from my standpoint.  Reports he had a normal-appearing bowel movement yesterday.  Hemoglobin stable from yesterday, BUN normal, no evidence of bleeding.   No nausea or vomiting, tolerating solid food.  I will call the patient with his endoscopic biopsy results when they are available, and also arrange outpatient colonoscopy for routine screening.  Patient should probably follow up with his primary physician in a week or so, for a stool Hemoccult, and to check hemoglobin level.  I will sign off.  Please call me if any questions.  Florencia Reasonsobert V. March Joos, M.D. Pager [phone] If no answer or after 5 PM call [phone]

## 2014-12-10 NOTE — Discharge Instructions (Signed)
It has been a pleasure taking care of you! You were admitted due to concern for bleeding from your gut. We performed endoscopy, which is basically a tube with a camera that is used to look at part of your gut. It has shown some stable ulcer in the upper part of your small intestine which is stable. We will be discharging you on some medication. The names and directions on how to take these medications are found on this discharge paper under medication section.  You also need a follow up with your PCP. The address, date and time are found on the discharge paper under follow up section.  Take care,  Gastrointestinal Bleeding Gastrointestinal (GI) bleeding means there is bleeding somewhere along the digestive tract, between the mouth and anus. CAUSES  There are many different problems that can cause GI bleeding. Possible causes include:  Esophagitis. This is inflammation, irritation, or swelling of the esophagus.  Hemorrhoids.These are veins that are full of blood (engorged) in the rectum. They cause pain, inflammation, and may bleed.  Anal fissures.These are areas of painful tearing which may bleed. They are often caused by passing hard stool.  Diverticulosis.These are pouches that form on the colon over time, with age, and may bleed significantly.  Diverticulitis.This is inflammation in areas with diverticulosis. It can cause pain, fever, and bloody stools, although bleeding is rare.  Polyps and cancer. Colon cancer often starts out as precancerous polyps.  Gastritis and ulcers.Bleeding from the upper gastrointestinal tract (near the stomach) may travel through the intestines and produce black, sometimes tarry, often bad smelling stools. In certain cases, if the bleeding is fast enough, the stools may not be black, but red. This condition may be life-threatening. SYMPTOMS   Vomiting bright red blood or material that looks like coffee grounds.  Bloody, black, or tarry  stools. DIAGNOSIS  Your caregiver may diagnose your condition by taking your history and performing a physical exam. More tests may be needed, including:  X-rays and other imaging tests.  Esophagogastroduodenoscopy (EGD). This test uses a flexible, lighted tube to look at your esophagus, stomach, and small intestine.  Colonoscopy. This test uses a flexible, lighted tube to look at your colon. TREATMENT  Treatment depends on the cause of your bleeding.   For bleeding from the esophagus, stomach, small intestine, or colon, the caregiver doing your EGD or colonoscopy may be able to stop the bleeding as part of the procedure.  Inflammation or infection of the colon can be treated with medicines.  Many rectal problems can be treated with creams, suppositories, or warm baths.  Surgery is sometimes needed.  Blood transfusions are sometimes needed if you have lost a lot of blood. If bleeding is slow, you may be allowed to go home. If there is a lot of bleeding, you will need to stay in the hospital for observation. HOME CARE INSTRUCTIONS   Take any medicines exactly as prescribed.  Keep your stools soft by eating foods that are high in fiber. These foods include whole grains, legumes, fruits, and vegetables. Prunes (1 to 3 a day) work well for many people.  Drink enough fluids to keep your urine clear or pale yellow. SEEK IMMEDIATE MEDICAL CARE IF:   Your bleeding increases.  You feel lightheaded, weak, or you faint.  You have severe cramps in your back or abdomen.  You pass large blood clots in your stool.  Your problems are getting worse. MAKE SURE YOU:   Understand these instructions.  Will  watch your condition.  Will get help right away if you are not doing well or get worse.   This information is not intended to replace advice given to you by your health care provider. Make sure you discuss any questions you have with your health care provider.   Document Released:  02/10/2000 Document Revised: 01/30/2012 Document Reviewed: 08/02/2014 Elsevier Interactive Patient Education [employer]

## 2014-12-14 ENCOUNTER — Inpatient Hospital Stay: Payer: BLUE CROSS/BLUE SHIELD | Admitting: Family Medicine

## 2015-01-05 ENCOUNTER — Other Ambulatory Visit: Payer: Self-pay | Admitting: Family Medicine

## 2015-03-25 ENCOUNTER — Other Ambulatory Visit: Payer: Self-pay | Admitting: Family Medicine

## 2015-04-01 ENCOUNTER — Ambulatory Visit (INDEPENDENT_AMBULATORY_CARE_PROVIDER_SITE_OTHER): Payer: BLUE CROSS/BLUE SHIELD | Admitting: Family Medicine

## 2015-04-01 ENCOUNTER — Encounter: Payer: Self-pay | Admitting: Family Medicine

## 2015-04-01 VITALS — BP 130/85 | HR 66 | Temp 97.9°F | Ht 72.0 in | Wt 234.3 lb

## 2015-04-01 DIAGNOSIS — Z23 Encounter for immunization: Secondary | ICD-10-CM | POA: Diagnosis not present

## 2015-04-01 DIAGNOSIS — I10 Essential (primary) hypertension: Secondary | ICD-10-CM | POA: Diagnosis not present

## 2015-04-01 DIAGNOSIS — Z9229 Personal history of other drug therapy: Secondary | ICD-10-CM

## 2015-04-01 DIAGNOSIS — F3170 Bipolar disorder, currently in remission, most recent episode unspecified: Secondary | ICD-10-CM

## 2015-04-01 DIAGNOSIS — K279 Peptic ulcer, site unspecified, unspecified as acute or chronic, without hemorrhage or perforation: Secondary | ICD-10-CM | POA: Diagnosis not present

## 2015-04-01 DIAGNOSIS — Z09 Encounter for follow-up examination after completed treatment for conditions other than malignant neoplasm: Secondary | ICD-10-CM | POA: Diagnosis not present

## 2015-04-01 LAB — CBC
HCT: 40.3 % (ref 39.0–52.0)
Hemoglobin: 13.1 g/dL (ref 13.0–17.0)
MCH: 32.5 pg (ref 26.0–34.0)
MCHC: 32.5 g/dL (ref 30.0–36.0)
MCV: 100 fL (ref 78.0–100.0)
MPV: 9.5 fL (ref 8.6–12.4)
PLATELETS: 190 10*3/uL (ref 150–400)
RBC: 4.03 MIL/uL — ABNORMAL LOW (ref 4.22–5.81)
RDW: 14 % (ref 11.5–15.5)
WBC: 8.6 10*3/uL (ref 4.0–10.5)

## 2015-04-01 LAB — COMPLETE METABOLIC PANEL WITH GFR
ALBUMIN: 3.5 g/dL — AB (ref 3.6–5.1)
ALK PHOS: 79 U/L (ref 40–115)
ALT: 239 U/L — AB (ref 9–46)
AST: 122 U/L — AB (ref 10–35)
BILIRUBIN TOTAL: 0.9 mg/dL (ref 0.2–1.2)
BUN: 17 mg/dL (ref 7–25)
CALCIUM: 8.7 mg/dL (ref 8.6–10.3)
CO2: 22 mmol/L (ref 20–31)
CREATININE: 1.26 mg/dL (ref 0.70–1.33)
Chloride: 107 mmol/L (ref 98–110)
GFR, Est African American: 73 mL/min (ref >=60)
GFR, Est Non African American: 63 mL/min (ref >=60)
Glucose, Bld: 126 mg/dL — ABNORMAL HIGH (ref 65–99)
Potassium: 4.8 mmol/L (ref 3.5–5.3)
Sodium: 139 mmol/L (ref 135–146)
Total Protein: 6.4 g/dL (ref 6.1–8.1)

## 2015-04-01 MED ORDER — PANTOPRAZOLE SODIUM 40 MG PO TBEC: 40.0000 mg | DELAYED_RELEASE_TABLET | Freq: Every day | ORAL | Status: DC

## 2015-04-01 MED ORDER — METOPROLOL TARTRATE 100 MG PO TABS: 100.0000 mg | ORAL_TABLET | Freq: Two times a day (BID) | ORAL | Status: DC

## 2015-04-01 MED ORDER — AMLODIPINE BESYLATE 5 MG PO TABS: 5.0000 mg | ORAL_TABLET | Freq: Every day | ORAL | Status: DC

## 2015-04-01 MED ORDER — ALLOPURINOL 300 MG PO TABS: 300.0000 mg | ORAL_TABLET | Freq: Every day | ORAL | Status: DC

## 2015-04-01 NOTE — Assessment & Plan Note (Signed)
Stable: Patient is brought in a prescription written by his psychiatrist asking for labs to be faxed to their office. Labs requested include lithium level, CMP, and TSH. - Labs obtained today. Maryclare Labrador fax these values to the [facility];  fax #: [phone]

## 2015-04-01 NOTE — Progress Notes (Signed)
HPI  CC: Medication refill Patient is here for medication refills. He states he has been doing well. Patient reports excellent compliance with his medications. No new symptoms or side effects reported.  Hypertension Today patient has a relatively elevated blood pressure. Patient reports taking his medications as prescribed this morning. He denies any symptoms of headache, vision changes, confusion, dizziness, fever, chills, nausea, vomiting, chest pain, shortness of breath.  Gastric ulcer Patient was admitted to the hospital back in October for a gastric ulcer and associated anemia. Patient did not follow-up for appropriate labs after this discharge. Since that time he endorses continued abdominal pain but no hematochezia or melena. He denies symptoms of reflux. Patient is unsure whether or not pain is exacerbated or relieved with food. It is noted that patient was not discharged on a PPI from previous admission. Patient denies any recent weakness, lightheadedness, headache, shortness of breath, chest pain, dizziness, confusion.  Bipolar disorder Patient reports excellent compliance with his medication regimen  Review of Systems   See HPI for ROS. All other systems reviewed and are negative.  Past medical history and social history reviewed and updated in the EMR as appropriate.  Objective: BP 130/85 mmHg  Pulse 66  Temp(Src) 97.9 F (36.6 C) (Oral)  Ht 6' (1.829 m)  Wt 234 lb 4.8 oz (106.278 kg)  BMI 31.77 kg/m2 Gen: NAD, alert, cooperative, and pleasant. HEENT: NCAT, EOMI, PERRL, slight pallor noted CV: RRR, no murmur Resp: CTAB, no wheezes, non-labored Abd: SNTND, moderately obese, BS present, no guarding or organomegaly Ext: No edema, warm   Assessment and plan:  Peptic ulcer Patient is here with complaints of vague abdominal pain. He denies any symptoms of GERD or heartburn. Unable to distinguish whether or not there was any association with food. He denies any  hematemesis, hematochezia, melena. Patient has a very significant history for peptic ulcer disease. Most recent admission was this past October in which he was noted to be anemic secondary to suspected GI blood loss. Patient was not discharged on PPI therapy. Patient did not follow-up with PCP after discharge. - Today I will initiate PPI therapy with Protonix 40 mg daily for the next 4 months.  - I informed patient that this was a prolonged dose and that's when he has no more refills I would like for him to take a vacation from this medication to assess if he continues to need it. Patient stated his understanding.  HYPERTENSION, BENIGN SYSTEMIC Stable/slightly worse: Patient presents today with reports of full compliance with his medication regimen. His blood pressure is slightly elevated specifically diastolic value. He denies any associated symptoms with this. Due to his high risk I would like to treat relatively aggressively. - I've refilled many of his medications today. - I would like for him to initiate amlodipine 5 mg daily. - I have asked that he make a follow-up appointment for a blood pressure check with our nursing staff in 2 weeks.  Bipolar disorder Stable: Patient is brought in a prescription written by his psychiatrist asking for labs to be faxed to their office. Labs requested include lithium level, CMP, and TSH. - Labs obtained today. Maryclare Labrador fax these values to the [facility];  fax #: [phone]    Orders Placed This Encounter  Procedures  . Flu Vaccine QUAD 36+ mos IM  . Tdap vaccine greater than or equal to 7yo IM  . COMPLETE METABOLIC PANEL WITH GFR  . TSH  . Lithium level  .  CBC    Meds ordered this encounter  Medications  . FLUoxetine (PROZAC) 40 MG capsule    Sig: Take 40 mg by mouth at bedtime.    Refill:  9  . pantoprazole (PROTONIX) 40 MG tablet    Sig: Take 1 tablet (40 mg total) by mouth daily.    Dispense:  30 tablet     Refill:  3  . metoprolol (LOPRESSOR) 100 MG tablet    Sig: Take 1 tablet (100 mg total) by mouth 2 (two) times daily.    Dispense:  60 tablet    Refill:  6  . allopurinol (ZYLOPRIM) 300 MG tablet    Sig: Take 1 tablet (300 mg total) by mouth daily.    Dispense:  30 tablet    Refill:  6  . amLODipine (NORVASC) 5 MG tablet    Sig: Take 1 tablet (5 mg total) by mouth daily.    Dispense:  30 tablet    Refill:  11     Kathee Delton, MD,MS,  PGY2 04/01/2015 7:46 PM

## 2015-04-01 NOTE — Patient Instructions (Signed)
It was a pleasure seeing you today in our clinic. Today we discussed your medications and blood pressure. Here is the treatment plan we have discussed and agreed upon together:   - I've refilled her medications which were in need of new orders. - I've added a medication which you had been taking in the past back onto your medication regimen. This medication is called amlodipine. Take 1 tablet every day. - If you experience any lightheadedness, dizziness, headache, nausea, or vomiting then discontinue this medication and contact our office. - I will mail you the results of the blood tests we took today or call you if there are any urgent matters to address. - I would like to have you follow-up in our office for a nursing blood pressure check in 2 weeks.

## 2015-04-01 NOTE — Assessment & Plan Note (Signed)
Stable/slightly worse: Patient presents today with reports of full compliance with his medication regimen. His blood pressure is slightly elevated specifically diastolic value. He denies any associated symptoms with this. Due to his high risk I would like to treat relatively aggressively. - I've refilled many of his medications today. - I would like for him to initiate amlodipine 5 mg daily. - I have asked that he make a follow-up appointment for a blood pressure check with our nursing staff in 2 weeks.

## 2015-04-01 NOTE — Assessment & Plan Note (Signed)
Patient is here with complaints of vague abdominal pain. He denies any symptoms of GERD or heartburn. Unable to distinguish whether or not there was any association with food. He denies any hematemesis, hematochezia, melena. Patient has a very significant history for peptic ulcer disease. Most recent admission was this past October in which he was noted to be anemic secondary to suspected GI blood loss. Patient was not discharged on PPI therapy. Patient did not follow-up with PCP after discharge. - Today I will initiate PPI therapy with Protonix 40 mg daily for the next 4 months.  - I informed patient that this was a prolonged dose and that's when he has no more refills I would like for him to take a vacation from this medication to assess if he continues to need it. Patient stated his understanding.

## 2015-04-02 LAB — LITHIUM LEVEL: Lithium Lvl: 0.7 mEq/L — ABNORMAL LOW (ref 0.80–1.40)

## 2015-04-02 LAB — TSH: TSH: 2.332 u[IU]/mL (ref 0.350–4.500)

## 2015-04-06 ENCOUNTER — Encounter: Payer: Self-pay | Admitting: Family Medicine

## 2015-04-15 ENCOUNTER — Ambulatory Visit (INDEPENDENT_AMBULATORY_CARE_PROVIDER_SITE_OTHER): Payer: BLUE CROSS/BLUE SHIELD | Admitting: *Deleted

## 2015-04-15 VITALS — BP 128/86 | HR 67

## 2015-04-15 DIAGNOSIS — Z136 Encounter for screening for cardiovascular disorders: Secondary | ICD-10-CM

## 2015-04-15 DIAGNOSIS — Z0130 Encounter for examination of blood pressure without abnormal findings: Secondary | ICD-10-CM

## 2015-04-15 NOTE — Progress Notes (Signed)
  Patient in nurse clinic for blood pressure check.  Patient denies any chest pain, SOB, Headache, dizziness, n/v or visual changes.  Patient stated he is taking his blood pressure medication at prescribed.  Clovis Pu, RN Filed Vitals:   04/15/15 1607  BP: 128/86  Pulse: 67

## 2015-07-26 ENCOUNTER — Other Ambulatory Visit: Payer: Self-pay | Admitting: *Deleted

## 2015-07-26 ENCOUNTER — Observation Stay (HOSPITAL_COMMUNITY)
Admission: EM | Admit: 2015-07-26 | Discharge: 2015-07-30 | Disposition: A | Discharge status: Home Health | Payer: BLUE CROSS/BLUE SHIELD | Attending: Family Medicine | Admitting: Family Medicine

## 2015-07-26 ENCOUNTER — Emergency Department (HOSPITAL_COMMUNITY): Payer: BLUE CROSS/BLUE SHIELD

## 2015-07-26 ENCOUNTER — Encounter (HOSPITAL_COMMUNITY): Payer: Self-pay | Admitting: Emergency Medicine

## 2015-07-26 DIAGNOSIS — F3162 Bipolar disorder, current episode mixed, moderate: Secondary | ICD-10-CM | POA: Insufficient documentation

## 2015-07-26 DIAGNOSIS — R260 Ataxic gait: Secondary | ICD-10-CM | POA: Diagnosis not present

## 2015-07-26 DIAGNOSIS — Z87820 Personal history of traumatic brain injury: Secondary | ICD-10-CM | POA: Diagnosis not present

## 2015-07-26 DIAGNOSIS — N179 Acute kidney failure, unspecified: Secondary | ICD-10-CM | POA: Diagnosis not present

## 2015-07-26 DIAGNOSIS — N39498 Other specified urinary incontinence: Secondary | ICD-10-CM | POA: Insufficient documentation

## 2015-07-26 DIAGNOSIS — R269 Unspecified abnormalities of gait and mobility: Secondary | ICD-10-CM

## 2015-07-26 DIAGNOSIS — R296 Repeated falls: Secondary | ICD-10-CM | POA: Insufficient documentation

## 2015-07-26 DIAGNOSIS — W1830XA Fall on same level, unspecified, initial encounter: Secondary | ICD-10-CM | POA: Insufficient documentation

## 2015-07-26 DIAGNOSIS — R4781 Slurred speech: Principal | ICD-10-CM | POA: Insufficient documentation

## 2015-07-26 DIAGNOSIS — Z8719 Personal history of other diseases of the digestive system: Secondary | ICD-10-CM | POA: Diagnosis not present

## 2015-07-26 DIAGNOSIS — Z8673 Personal history of transient ischemic attack (TIA), and cerebral infarction without residual deficits: Secondary | ICD-10-CM | POA: Diagnosis not present

## 2015-07-26 DIAGNOSIS — K921 Melena: Secondary | ICD-10-CM | POA: Insufficient documentation

## 2015-07-26 DIAGNOSIS — F17210 Nicotine dependence, cigarettes, uncomplicated: Secondary | ICD-10-CM | POA: Insufficient documentation

## 2015-07-26 DIAGNOSIS — N183 Chronic kidney disease, stage 3 (moderate): Secondary | ICD-10-CM | POA: Insufficient documentation

## 2015-07-26 DIAGNOSIS — F319 Bipolar disorder, unspecified: Secondary | ICD-10-CM | POA: Insufficient documentation

## 2015-07-26 DIAGNOSIS — I639 Cerebral infarction, unspecified: Secondary | ICD-10-CM

## 2015-07-26 DIAGNOSIS — K7290 Hepatic failure, unspecified without coma: Secondary | ICD-10-CM | POA: Diagnosis not present

## 2015-07-26 DIAGNOSIS — Y93H2 Activity, gardening and landscaping: Secondary | ICD-10-CM | POA: Diagnosis not present

## 2015-07-26 DIAGNOSIS — F17200 Nicotine dependence, unspecified, uncomplicated: Secondary | ICD-10-CM | POA: Diagnosis present

## 2015-07-26 DIAGNOSIS — F1020 Alcohol dependence, uncomplicated: Secondary | ICD-10-CM | POA: Diagnosis present

## 2015-07-26 DIAGNOSIS — Y998 Other external cause status: Secondary | ICD-10-CM | POA: Diagnosis not present

## 2015-07-26 DIAGNOSIS — Z79899 Other long term (current) drug therapy: Secondary | ICD-10-CM

## 2015-07-26 DIAGNOSIS — G3184 Mild cognitive impairment, so stated: Secondary | ICD-10-CM | POA: Diagnosis not present

## 2015-07-26 DIAGNOSIS — Z7982 Long term (current) use of aspirin: Secondary | ICD-10-CM | POA: Insufficient documentation

## 2015-07-26 DIAGNOSIS — I10 Essential (primary) hypertension: Secondary | ICD-10-CM | POA: Insufficient documentation

## 2015-07-26 DIAGNOSIS — E1122 Type 2 diabetes mellitus with diabetic chronic kidney disease: Secondary | ICD-10-CM | POA: Diagnosis not present

## 2015-07-26 DIAGNOSIS — W19XXXA Unspecified fall, initial encounter: Secondary | ICD-10-CM | POA: Insufficient documentation

## 2015-07-26 DIAGNOSIS — E876 Hypokalemia: Secondary | ICD-10-CM | POA: Insufficient documentation

## 2015-07-26 DIAGNOSIS — I129 Hypertensive chronic kidney disease with stage 1 through stage 4 chronic kidney disease, or unspecified chronic kidney disease: Secondary | ICD-10-CM | POA: Diagnosis not present

## 2015-07-26 DIAGNOSIS — R29898 Other symptoms and signs involving the musculoskeletal system: Secondary | ICD-10-CM | POA: Diagnosis present

## 2015-07-26 DIAGNOSIS — Y92007 Garden or yard of unspecified non-institutional (private) residence as the place of occurrence of the external cause: Secondary | ICD-10-CM | POA: Insufficient documentation

## 2015-07-26 DIAGNOSIS — M62838 Other muscle spasm: Secondary | ICD-10-CM | POA: Insufficient documentation

## 2015-07-26 DIAGNOSIS — R531 Weakness: Secondary | ICD-10-CM | POA: Diagnosis not present

## 2015-07-26 DIAGNOSIS — R32 Unspecified urinary incontinence: Secondary | ICD-10-CM

## 2015-07-26 LAB — CBC
HCT: 38.5 % — ABNORMAL LOW (ref 39.0–52.0)
Hemoglobin: 12.5 g/dL — ABNORMAL LOW (ref 13.0–17.0)
MCH: 32.1 pg (ref 26.0–34.0)
MCHC: 32.5 g/dL (ref 30.0–36.0)
MCV: 98.7 fL (ref 78.0–100.0)
PLATELETS: 133 10*3/uL — AB (ref 150–400)
RBC: 3.9 MIL/uL — AB (ref 4.22–5.81)
RDW: 13.2 % (ref 11.5–15.5)
WBC: 10.2 10*3/uL (ref 4.0–10.5)

## 2015-07-26 LAB — COMPREHENSIVE METABOLIC PANEL
ALBUMIN: 3.2 g/dL — AB (ref 3.5–5.0)
ALK PHOS: 56 U/L (ref 38–126)
ALT: 76 U/L — ABNORMAL HIGH (ref 17–63)
ANION GAP: 6 (ref 5–15)
AST: 71 U/L — ABNORMAL HIGH (ref 15–41)
BUN: 11 mg/dL (ref 6–20)
CALCIUM: 9.6 mg/dL (ref 8.9–10.3)
CHLORIDE: 111 mmol/L (ref 101–111)
CO2: 19 mmol/L — AB (ref 22–32)
Creatinine, Ser: 1.76 mg/dL — ABNORMAL HIGH (ref 0.61–1.24)
GFR calc non Af Amer: 42 mL/min — ABNORMAL LOW (ref >60)
GFR, EST AFRICAN AMERICAN: 48 mL/min — AB (ref >60)
GLUCOSE: 133 mg/dL — AB (ref 65–99)
POTASSIUM: 3.9 mmol/L (ref 3.5–5.1)
SODIUM: 136 mmol/L (ref 135–145)
Total Bilirubin: 1.1 mg/dL (ref 0.3–1.2)
Total Protein: 6.8 g/dL (ref 6.5–8.1)

## 2015-07-26 LAB — DIFFERENTIAL
BASOS PCT: 0 %
Basophils Absolute: 0 10*3/uL (ref 0.0–0.1)
EOS PCT: 0 %
Eosinophils Absolute: 0 10*3/uL (ref 0.0–0.7)
LYMPHS PCT: 9 %
Lymphs Abs: 0.9 10*3/uL (ref 0.7–4.0)
MONO ABS: 0.8 10*3/uL (ref 0.1–1.0)
Monocytes Relative: 8 %
NEUTROS PCT: 83 %
Neutro Abs: 8.5 10*3/uL — ABNORMAL HIGH (ref 1.7–7.7)

## 2015-07-26 LAB — I-STAT CHEM 8, ED
BUN: 14 mg/dL (ref 6–20)
CHLORIDE: 110 mmol/L (ref 101–111)
Calcium, Ion: 1.25 mmol/L — ABNORMAL HIGH (ref 1.12–1.23)
Creatinine, Ser: 1.6 mg/dL — ABNORMAL HIGH (ref 0.61–1.24)
GLUCOSE: 133 mg/dL — AB (ref 65–99)
HEMATOCRIT: 42 % (ref 39.0–52.0)
HEMOGLOBIN: 14.3 g/dL (ref 13.0–17.0)
POTASSIUM: 4 mmol/L (ref 3.5–5.1)
SODIUM: 140 mmol/L (ref 135–145)
TCO2: 18 mmol/L (ref 0–100)

## 2015-07-26 LAB — AMMONIA: AMMONIA: 51 umol/L — AB (ref 9–35)

## 2015-07-26 LAB — CBG MONITORING, ED: GLUCOSE-CAPILLARY: 130 mg/dL — AB (ref 65–99)

## 2015-07-26 LAB — APTT: aPTT: 24 seconds (ref 24–37)

## 2015-07-26 LAB — ETHANOL

## 2015-07-26 LAB — I-STAT TROPONIN, ED: Troponin i, poc: 0.03 ng/mL (ref 0.00–0.08)

## 2015-07-26 LAB — PROTIME-INR
INR: 1.14 (ref 0.00–1.49)
PROTHROMBIN TIME: 14.8 s (ref 11.6–15.2)

## 2015-07-26 MED ORDER — ATORVASTATIN CALCIUM 20 MG PO TABS: 20.0000 mg | ORAL_TABLET | Freq: Every day | ORAL | Status: DC

## 2015-07-26 MED ORDER — SODIUM CHLORIDE 0.9 % IV BOLUS (SEPSIS)
500.0000 mL | Freq: Once | INTRAVENOUS | Status: AC
  Administered 2015-07-26: 500 mL via INTRAVENOUS

## 2015-07-26 NOTE — ED Provider Notes (Signed)
CSN: [id number]     Arrival date & time 07/26/15  2147 History   First MD Initiated Contact with Patient 07/26/15 2155     Chief Complaint  Patient presents with  . Fall  . Stroke Symptoms     (Consider location/radiation/quality/duration/timing/severity/associated sxs/prior Treatment) Patient is a 55 y.o. male presenting with fall. The history is provided by the patient (Around 2:00 the patient fell and was unable to get up. Family states he's weaker than normal and has some slurred speech).  Fall This is a new problem. The current episode started 6 to 12 hours ago. The problem occurs rarely. The problem has been resolved. Pertinent negatives include no chest pain, no abdominal pain and no headaches. Nothing aggravates the symptoms. Nothing relieves the symptoms. He has tried nothing for the symptoms.    Past Medical History  Diagnosis Date  . Hypertension   . Diabetes mellitus   . Hyperkalemia 12/08/2014  . Bipolar disorder (HCC)   . Alcohol abuse   . Gastric ulcer   . Hepatic encephalopathy [facility](HCC)    Past Surgical History  Procedure Laterality Date  . Esophagogastroduodenoscopy (egd) with propofol Left 12/09/2014    Procedure: ESOPHAGOGASTRODUODENOSCOPY (EGD) WITH PROPOFOL;  Surgeon: Bernette Redbirdobert Buccini, MD;  [facility];  Service: Endoscopy;  Laterality: Left;   No family history on file. Social History  Substance Use Topics  . Smoking status: Current Every Day Smoker -- 0.50 packs/day for 30 years    Types: Cigarettes  . Smokeless tobacco: Never Used     Comment: does not want to quitt right now  . Alcohol Use: 8.4 oz/week    14 Cans of beer, 0 Shots of liquor per week     Comment: " every friday I get a 5th "    Review of Systems  Constitutional: Negative for appetite change and fatigue.  HENT: Negative for congestion, ear discharge and sinus pressure.   Eyes: Negative for discharge.  Respiratory: Negative for cough.   Cardiovascular: Negative for chest  pain.  Gastrointestinal: Negative for abdominal pain and diarrhea.  Genitourinary: Negative for frequency and hematuria.  Musculoskeletal: Negative for back pain.  Skin: Negative for rash.  Neurological: Negative for seizures and headaches.  Psychiatric/Behavioral: Negative for hallucinations.      Allergies  Ace inhibitors and Penicillins  Home Medications   Prior to Admission medications   Medication Sig Start Date End Date Taking? Authorizing Provider  allopurinol (ZYLOPRIM) 300 MG tablet Take 1 tablet (300 mg total) by mouth daily. 04/01/15   Kathee DeltonIan D McKeag, MD  amLODipine (NORVASC) 5 MG tablet Take 1 tablet (5 mg total) by mouth daily. 04/01/15   Kathee DeltonIan D McKeag, MD  aspirin 81 MG chewable tablet Chew 1 tablet (81 mg total) by mouth daily. 05/12/10   Floydene FlockSteven J Newton, MD  atorvastatin (LIPITOR) 20 MG tablet Take 1 tablet (20 mg total) by mouth daily. 07/26/15   Kathee DeltonIan D McKeag, MD  FLUoxetine (PROZAC) 40 MG capsule Take 40 mg by mouth at bedtime. 03/21/15   Historical Provider, MD  folic acid (FOLVITE) 1 MG tablet TAKE 1 TABLET (1 MG TOTAL) BY MOUTH DAILY. 03/29/15   Kathee DeltonIan D McKeag, MD  lactulose (CHRONULAC) 10 GM/15ML solution Take 45 mLs (30 g total) by mouth daily. 12/06/14   Kathee DeltonIan D McKeag, MD  lithium (ESKALITH) 450 MG CR tablet Take 1 tablet (450 mg total) by mouth daily. For mental health 05/12/10   Floydene FlockSteven J Newton, MD  metoprolol (LOPRESSOR) 100 MG  tablet Take 1 tablet (100 mg total) by mouth 2 (two) times daily. 04/01/15   Kathee Delton, MD  Multiple Vitamin (MULTIVITAMIN) capsule Take 1 capsule by mouth daily. 05/12/10   Floydene Flock, MD  nicotine (NICODERM CQ - DOSED IN MG/24 HOURS) 14 mg/24hr patch Place 1 patch (14 mg total) onto the skin daily. 12/10/14   Almon Hercules, MD  omega-3 acid ethyl esters (LOVAZA) 1 g capsule TAKE 1 CAPSULE (1 G TOTAL) BY MOUTH DAILY. 03/29/15   Kathee Delton, MD  pantoprazole (PROTONIX) 40 MG tablet Take 1 tablet (40 mg total) by mouth daily. 04/01/15   Kathee Delton,  MD  spironolactone (ALDACTONE) 50 MG tablet Take 1 tablet (50 mg total) by mouth daily. 06/03/14   Elenora Gamma, MD  Thiamine HCl (VITAMIN B-1) 100 MG tablet Take 1 tablet (100 mg total) by mouth daily. Patient not taking: Reported on 03/26/2014 05/12/10   Floydene Flock, MD   BP 108/71 mmHg  Pulse 86  Temp(Src) 98.2 F (36.8 C) (Oral)  Resp 15  SpO2 98% Physical Exam  Constitutional: He is oriented to person, place, and time. He appears well-developed.  HENT:  Head: Normocephalic.  Eyes: Conjunctivae and EOM are normal. No scleral icterus.  Neck: Neck supple. No thyromegaly present.  Cardiovascular: Normal rate and regular rhythm.  Exam reveals no gallop and no friction rub.   No murmur heard. Pulmonary/Chest: No stridor. He has no wheezes. He has no rales. He exhibits no tenderness.  Abdominal: He exhibits no distension. There is no tenderness. There is no rebound.  Musculoskeletal: Normal range of motion. He exhibits no edema.  Lymphadenopathy:    He has no cervical adenopathy.  Neurological: He is alert and oriented to person, place, and time. He exhibits normal muscle tone. Coordination normal.  Patient has some slurred speech. Also he is got general weakness both lower legs which according to family this is unusual for him. On his right leg seems to be more weak in his left leg. And he was too weak to get off the floor with palpation today  Skin: No rash noted. No erythema.  Psychiatric: He has a normal mood and affect. His behavior is normal.    ED Course  Procedures (including critical care time) Labs Review Labs Reviewed  CBC - Abnormal; Notable for the following:    RBC 3.90 (*)    Hemoglobin 12.5 (*)    HCT 38.5 (*)    Platelets 133 (*)    All other components within normal limits  DIFFERENTIAL - Abnormal; Notable for the following:    Neutro Abs 8.5 (*)    All other components within normal limits  COMPREHENSIVE METABOLIC PANEL - Abnormal; Notable for the  following:    CO2 19 (*)    Glucose, Bld 133 (*)    Creatinine, Ser 1.76 (*)    Albumin 3.2 (*)    AST 71 (*)    ALT 76 (*)    GFR calc non Af Amer 42 (*)    GFR calc Af Amer 48 (*)    All other components within normal limits  AMMONIA - Abnormal; Notable for the following:    Ammonia 51 (*)    All other components within normal limits  CBG MONITORING, ED - Abnormal; Notable for the following:    Glucose-Capillary 130 (*)    All other components within normal limits  I-STAT CHEM 8, ED - Abnormal; Notable for the following:  Creatinine, Ser 1.60 (*)    Glucose, Bld 133 (*)    Calcium, Ion 1.25 (*)    All other components within normal limits  PROTIME-INR  APTT  ETHANOL  I-STAT TROPOININ, ED    Imaging Review Dg Chest 2 View  07/26/2015  CLINICAL DATA:  Fall. EXAM: CHEST  2 VIEW COMPARISON:  08/14/2010 FINDINGS: Borderline cardiomegaly, likely accentuated by AP technique. The lungs are clear. Pulmonary vasculature is normal. No consolidation, pleural effusion, or pneumothorax. Remote left rib fractures are again seen. No acute osseous abnormalities are seen. IMPRESSION: No acute abnormality in the chest. Electronically Signed   By: Rubye Oaks M.D.   On: 07/26/2015 23:00   I have personally reviewed and evaluated these images and lab results as part of my medical decision-making.   EKG Interpretation None      MDM   Final diagnoses:  None    Patient will be admitted for stroke workup    Bethann Berkshire, MD 07/26/15 2335

## 2015-07-26 NOTE — ED Notes (Signed)
MD at bedside. 

## 2015-07-26 NOTE — ED Notes (Signed)
EDP at bedside  

## 2015-07-26 NOTE — ED Notes (Signed)
Patient transported to X-ray 

## 2015-07-26 NOTE — H&P (Signed)
Family Medicine Teaching [address] [facility] Admission History and Physical Service Pager: [phone]  Patient name: [name] [name] Medical record number: [id number] Date of birth: 1960/10/25 Age: 55 y.o. Gender: male  Primary Care Provider: Mickie Hillier, MD Consultants: Neurology  Code Status: Full   Chief Complaint: Fall   Assessment and Plan: [name] [name] is a 55 y.o. male presenting following a fall with acute onset of worsening weakness in left lower extremity. PMH is significant for HTN, hepatomegaly, hepatic encephalopathy, Biopolar disorder, Anemia, alcoholism, tobacco dependence   Presumed CVA, with dysarthria and Left LE weakness causing fall, H/o CVA: Acute worsening of leg weakness (more in left leg), no upper extremity weakness per patient. No changes in sensation or numbness. Neuro exam positive for hyperreflexia in LLE, 4/5 strength in both lower extremities. Dysmetria on right/left side. Worsened Dysarthria (patient has some baseline slurring). Patient also with centripetal weakness on exam. Risk factors for stroke include 20 pack years of smoking, HTN. Patient with previous stroke in 1999 per family. Head CT showing no evidence of acute intracranial abnormality. Old lacunar infarct in the left corona radiata. Atrophy with small vessel ischemic changes. Troponin 0.03 x 1. EKG with no ischemic changes. Cervical CT with no spinal injury. Patient also noted to have a hx of urinary/bowel incontinence over the last 2 months. However no saddle anesthesia,back pain, weight loss, or fever/chills. Moderate Rectal tone. Likely in this setting with multiple neurologic changes would consider the weakness due to central neurologic issue such as ischemic stroke, however would consider ruling out spinal cord compression.    - Admit to family medicine, attending Dr. Randolm Idol - Risk stratification labs include HA1C and Lipid panel  - Neuro check q2 hours for 12 hours, followed q4hrs  - MRI brain,  Carotids, ECHO, MRA Head/Neck - MRI brain and MRA without identified acute CVA, but significant chronic vascular changes, remote lacunar infarcts, with advanced cerebral atrophy, concern for possible vascular dementia - Neurology recs  - telemetry  - repeat EKG  - Vitals per floor  - NPO pending bedside swallow  - PT/OT/ST consulted  - Permissive HTN for 24 hours, if SBP> 220 or DBP > 120, can give prn hydralazine  - Holding spirolactone, metoprolol, Norvasc    Urinary / Bowel Incontinence, with lower ext weakness See above A&P regarding possible CVA. Concern for additional problem with spinal cord compression or low back injury given incontinence x 2 months, without other symptoms of cauda equina (no saddle anesthesia) some rectal tone but it is reduced. No known low back problem / OA / surgery or trauma - Will proceed with Lumbar MRI to evaluate any compression or nerve injury maybe contributing to LE weakness, incontinence, reduced rectal tone  Hx of Recent Falls: Patient has hx of hepatic encephalopathy, sometimes compliant with lactulose per review of family medicine notes. On admission, no asterixis noted, however ammonia elevated to 55 this admission. Patient also with hx of alcohol abuse, UDS positive for THC, Ethanol less than > 5. Hx of bipolar disease, on Lithium. Lithium toxicity can cause neurologic symptoms as well, therefore with check level for possibly toxicity  - Will get Lithium level - within normal range 0.78 - Will get CK level due to patient being down for 5 hours to check of rhabdo - Will continue lactulose   AKI, on CKDII-III: SCr 1.60 (baseline 1.2)  on admission, patient with dry mucosa membranes on admission. Received 500 ml bolus in the ED  - Will follow BMET  Hx of GI bleed. Patient currently endorse blood in stool and dark stools.  Hx of Upper GI bleeding 11/2014, positive for duodneal ulcers at that time. Hgb 12.5>14.3, sttable this admission. Patient denies  palpations, dizziness etc.  - FOBT while here (FOBT negative, 5/31) - Would consider outpatient work up with a colonoscopy  - monitor CBC   HTN, holding metoprolol, spirolactone and Norvasc  - Restart anti-HTN after 24 hours   Hx of alcohol use  - Denies recent alcohol use  - CIWA protocol  - Thiamine and Folate   Hepatic Encephalopathy: Ammonia 55, alert x self and place. Per family baseline does not know the month  - lactulose daily, titrate for bowel movement   Biopolar Disorder  - Continue Prozac and Lithium   FEN/GI: NPO pending swallow study > advanced diet Prophylaxis: Lovenox   Disposition: Telemetry   History of Present Illness:  [name] [name] is a 55 y.o. male presenting following a fall. Per patient was waking back from the bathroom, and fell in hall way. He could not get up following this fall. Therefore he ended up sitting in the hallway for about 5 hours, until his wife came home. Patient notes that he "just was not able to get back up." Patient did indicate having left lower extremity weakness. Patient denies ever losing consciousness during this event. Per discussion with wife and daughter, since his stroke in 1999, patient has had slow speech, weakness in both legs requiring a walker, and inability to perform complex task. Patient often ask what month of the year it is as he does not know. Per family patient has also has a hx of slight right eye droop due injury many years ago requiring surgery. Patient per family falls often, last falling against the fence yesterday after cutting the grass. Patient also has increasing hx of urinary and fecal incontience over the past two months, however denies any back pain, saddle anesthesia, or recent weight loss.   Denies any known history of low back injury or arthritis. No prior imaging. No prior surgery on low back.  Review Of Systems: Per HPI with the following additions: Review of Systems  Constitutional: Negative for weight  loss and diaphoresis.  Respiratory: Negative for cough, hemoptysis and shortness of breath.   Cardiovascular: Negative for chest pain and palpitations.  Gastrointestinal: Positive for blood in stool and melena. Negative for nausea, vomiting and abdominal pain.  Genitourinary: Negative for dysuria.  Neurological: Positive for focal weakness. Negative for dizziness, tingling, sensory change and loss of consciousness.    Otherwise the remainder of the systems were negative.  Patient Active Problem List   Diagnosis Date Noted  . Gastrointestinal hemorrhage associated with duodenal ulcer   . Hyperkalemia 12/08/2014  . Tinea corporis 12/25/2011  . Hepatic encephalopathy (HCC) 09/03/2010  . Hepatic steatosis 06/09/2010  . Bipolar disorder (HCC) 05/14/2010  . Hepatomegaly 05/12/2010  . ANEMIA, FOLIC ACID DEFICIENCY 08/02/2009  . ABNORMALITY OF GAIT 07/28/2009  . Alcoholism (HCC) 07/11/2009  . PROTEINURIA 08/07/2006  . HYPERTRIGLYCERIDEMIA 04/25/2006  . GOUT, CHRONIC 04/25/2006  . OBESITY, NOS 04/25/2006  . DEPRESSION, MAJOR, RECURRENT 04/25/2006  . TOBACCO DEPENDENCE 04/25/2006  . HYPERTENSION, BENIGN SYSTEMIC 04/25/2006  . Peptic ulcer 04/25/2006  . IMPOTENCE, ORGANIC 04/25/2006    Past Medical History: Past Medical History  Diagnosis Date  . Hypertension   . Diabetes mellitus   . Hyperkalemia 12/08/2014  . Bipolar disorder (HCC)   . Alcohol abuse   . Gastric ulcer   .  Hepatic encephalopathy [facility])     Past Surgical History: Past Surgical History  Procedure Laterality Date  . Esophagogastroduodenoscopy (egd) with propofol Left 12/09/2014    Procedure: ESOPHAGOGASTRODUODENOSCOPY (EGD) WITH PROPOFOL;  Surgeon: Bernette Redbird, MD;  Location: [facility] ENDOSCOPY;  Service: Endoscopy;  Laterality: Left;    Social History: Social History  Substance Use Topics  . Smoking status: Current Every Day Smoker -- 0.50 packs/day for 30 years    Types: Cigarettes  . Smokeless tobacco: Never  Used     Comment: does not want to quitt right now  . Alcohol Use: 8.4 oz/week    14 Cans of beer, 0 Shots of liquor per week     Comment: " every friday I get a 5th "   Additional social history:   Please also refer to relevant sections of EMR.  Family History: No family history on file.  Allergies and Medications: Allergies  Allergen Reactions  . Ace Inhibitors     REACTION: angioedema  . Penicillins     REACTION: rash   No current facility-administered medications on file prior to encounter.   Current Outpatient Prescriptions on File Prior to Encounter  Medication Sig Dispense Refill  . allopurinol (ZYLOPRIM) 300 MG tablet Take 1 tablet (300 mg total) by mouth daily. 30 tablet 6  . amLODipine (NORVASC) 5 MG tablet Take 1 tablet (5 mg total) by mouth daily. 30 tablet 11  . aspirin 81 MG chewable tablet Chew 1 tablet (81 mg total) by mouth daily. 30 tablet 6  . atorvastatin (LIPITOR) 20 MG tablet Take 1 tablet (20 mg total) by mouth daily. 90 tablet 3  . FLUoxetine (PROZAC) 40 MG capsule Take 40 mg by mouth at bedtime.  9  . folic acid (FOLVITE) 1 MG tablet TAKE 1 TABLET (1 MG TOTAL) BY MOUTH DAILY. 30 tablet 6  . lactulose (CHRONULAC) 10 GM/15ML solution Take 45 mLs (30 g total) by mouth daily. 1892 mL 11  . lithium (ESKALITH) 450 MG CR tablet Take 1 tablet (450 mg total) by mouth daily. For mental health 30 tablet 6  . metoprolol (LOPRESSOR) 100 MG tablet Take 1 tablet (100 mg total) by mouth 2 (two) times daily. 60 tablet 6  . Multiple Vitamin (MULTIVITAMIN) capsule Take 1 capsule by mouth daily. 30 capsule 6  . nicotine (NICODERM CQ - DOSED IN MG/24 HOURS) 14 mg/24hr patch Place 1 patch (14 mg total) onto the skin daily. 28 patch 0  . omega-3 acid ethyl esters (LOVAZA) 1 g capsule TAKE 1 CAPSULE (1 G TOTAL) BY MOUTH DAILY. 30 capsule 6  . pantoprazole (PROTONIX) 40 MG tablet Take 1 tablet (40 mg total) by mouth daily. 30 tablet 3  . spironolactone (ALDACTONE) 50 MG tablet  Take 1 tablet (50 mg total) by mouth daily. 30 tablet 5  . Thiamine HCl (VITAMIN B-1) 100 MG tablet Take 1 tablet (100 mg total) by mouth daily. (Patient not taking: Reported on 03/26/2014) 30 tablet 6    Objective: BP 108/71 mmHg  Pulse 86  Temp(Src) 98.2 F (36.8 C) (Oral)  Resp 15  SpO2 98% Exam: General: Patient lying in bed, NAD  Eyes: PERRL, EOMI, right eye drop compared to left eye  ENTM: Dry mucosa membranes Neck: No lymphadenopathy  Cardiovascular: RRR, no murmurs  Respiratory: Anterior only, CTAB, no wheezes  Abdomen: protuberant, BS+, no ttp, no distention MSK: no lowe extremity edema  Skin: Extensive Bruising on left upper extremity  Neuro:  Cn 2-7 intact Strength equal &  normal in upper, 4/5 strength lower extremities (Left lower ext 4/5 with Right LE improved to 5/5), LLE hyperreflexia, RLE normal reflexes, No clonus  Heel to shin performed wnl, finger to nose not normal   Psych: flat affect  Rectal: (performed 5/31 AM) normal appearing external rectum without fissure or ext hemorrhoid, minimal to mild anal wink reflex on light touch bilaterally, reduced but mild-mod rectal tone on digital exam, able to control and clench anal sphincter, no masses or lesions, non-tender, no bright red blood, stool card FOBT collected  Labs and Imaging: CBC BMET   Recent Labs Lab 07/26/15 2204 07/26/15 2211  WBC 10.2  --   HGB 12.5* 14.3  HCT 38.5* 42.0  PLT 133*  --     Recent Labs Lab 07/26/15 2204 07/26/15 2211  NA 136 140  K 3.9 4.0  CL 111 110  CO2 19*  --   BUN 11 14  CREATININE 1.76* 1.60*  GLUCOSE 133* 133*  CALCIUM 9.6  --      Troponin 0.03   EKG - no ischemic changes, artifact   Dg Chest 2 View  07/26/2015  CLINICAL DATA:  Fall. EXAM: CHEST  2 VIEW COMPARISON:  08/14/2010 FINDINGS: Borderline cardiomegaly, likely accentuated by AP technique. The lungs are clear. Pulmonary vasculature is normal. No consolidation, pleural effusion, or pneumothorax.  Remote left rib fractures are again seen. No acute osseous abnormalities are seen. IMPRESSION: No acute abnormality in the chest. Electronically Signed   By: Rubye Oaks M.D.   On: 07/26/2015 23:00   Ct Head Wo Contrast  07/26/2015  CLINICAL DATA:  Found down EXAM: CT HEAD WITHOUT CONTRAST CT CERVICAL SPINE WITHOUT CONTRAST TECHNIQUE: Multidetector CT imaging of the head and cervical spine was performed following the standard protocol without intravenous contrast. Multiplanar CT image reconstructions of the cervical spine were also generated. COMPARISON:  MRI brain dated 08/11/2010 FINDINGS: CT HEAD FINDINGS No evidence of parenchymal hemorrhage or extra-axial fluid collection. No mass lesion, mass effect, or midline shift. No CT evidence of acute infarction. Old lacunar infarct in the left corona radiata. Subcortical white matter and periventricular small vessel ischemic changes. Global cortical atrophy.  No ventriculomegaly. The visualized paranasal sinuses are essentially clear. Minimal partial opacification of the left mastoid air cells. No evidence of calvarial fracture. CT CERVICAL SPINE FINDINGS Mild straightening of the normal cervical lordosis. No evidence of fracture or dislocation. Vertebral body heights and intervertebral disc spaces are maintained. Dens appears intact. No prevertebral soft tissue swelling. Mild degenerative changes of the mid cervical spine. Visualized thyroid is unremarkable. Visualized lung apices are clear. IMPRESSION: No evidence of acute intracranial abnormality. Old lacunar infarct in the left corona radiata. Atrophy with small vessel ischemic changes. No evidence of traumatic injury to the cervical spine. Mild degenerative changes. Electronically Signed   By: Charline Bills M.D.   On: 07/26/2015 23:22   Ct Cervical Spine Wo Contrast  07/26/2015  CLINICAL DATA:  Found down EXAM: CT HEAD WITHOUT CONTRAST CT CERVICAL SPINE WITHOUT CONTRAST TECHNIQUE: Multidetector CT  imaging of the head and cervical spine was performed following the standard protocol without intravenous contrast. Multiplanar CT image reconstructions of the cervical spine were also generated. COMPARISON:  MRI brain dated 08/11/2010 FINDINGS: CT HEAD FINDINGS No evidence of parenchymal hemorrhage or extra-axial fluid collection. No mass lesion, mass effect, or midline shift. No CT evidence of acute infarction. Old lacunar infarct in the left corona radiata. Subcortical white matter and periventricular small vessel ischemic changes.  Global cortical atrophy.  No ventriculomegaly. The visualized paranasal sinuses are essentially clear. Minimal partial opacification of the left mastoid air cells. No evidence of calvarial fracture. CT CERVICAL SPINE FINDINGS Mild straightening of the normal cervical lordosis. No evidence of fracture or dislocation. Vertebral body heights and intervertebral disc spaces are maintained. Dens appears intact. No prevertebral soft tissue swelling. Mild degenerative changes of the mid cervical spine. Visualized thyroid is unremarkable. Visualized lung apices are clear. IMPRESSION: No evidence of acute intracranial abnormality. Old lacunar infarct in the left corona radiata. Atrophy with small vessel ischemic changes. No evidence of traumatic injury to the cervical spine. Mild degenerative changes. Electronically Signed   By: Charline Bills M.D.   On: 07/26/2015 23:22     Asiyah Mayra Reel, MD 07/26/2015, 11:34 PM PGY-1, [facility] FPTS Intern pager: [phone], text pages welcome  Upper Level Addendum:  I have seen and evaluated this patient along with Dr. Cathlean Cower and reviewed the above note, making necessary revisions in purple.  Saralyn Pilar, DO [facility], PGY-3

## 2015-07-26 NOTE — ED Notes (Signed)
Pt in EMS after wife pt found on floor. Unknown time of fall, unwitnessed. Wife left for work 0800 and pt was normal, returned at 1840 and found pt on floor. Pt was initially nonverbal and began to answer questions en route. Pt incontinent and diaphoretic. VSS.  CBG 221

## 2015-07-26 NOTE — ED Notes (Signed)
CBG 130 

## 2015-07-26 NOTE — ED Notes (Signed)
Pt also has bruising all over body. EDP is aware of pt S/S

## 2015-07-27 ENCOUNTER — Other Ambulatory Visit (HOSPITAL_COMMUNITY): Payer: BLUE CROSS/BLUE SHIELD

## 2015-07-27 ENCOUNTER — Inpatient Hospital Stay (HOSPITAL_COMMUNITY): Payer: BLUE CROSS/BLUE SHIELD

## 2015-07-27 DIAGNOSIS — I10 Essential (primary) hypertension: Secondary | ICD-10-CM | POA: Insufficient documentation

## 2015-07-27 DIAGNOSIS — R32 Unspecified urinary incontinence: Secondary | ICD-10-CM | POA: Diagnosis not present

## 2015-07-27 DIAGNOSIS — R29898 Other symptoms and signs involving the musculoskeletal system: Secondary | ICD-10-CM | POA: Diagnosis not present

## 2015-07-27 DIAGNOSIS — Z8673 Personal history of transient ischemic attack (TIA), and cerebral infarction without residual deficits: Secondary | ICD-10-CM | POA: Diagnosis not present

## 2015-07-27 DIAGNOSIS — R269 Unspecified abnormalities of gait and mobility: Secondary | ICD-10-CM | POA: Diagnosis not present

## 2015-07-27 DIAGNOSIS — N39498 Other specified urinary incontinence: Secondary | ICD-10-CM | POA: Insufficient documentation

## 2015-07-27 DIAGNOSIS — W19XXXA Unspecified fall, initial encounter: Secondary | ICD-10-CM | POA: Diagnosis not present

## 2015-07-27 DIAGNOSIS — F1020 Alcohol dependence, uncomplicated: Secondary | ICD-10-CM

## 2015-07-27 DIAGNOSIS — M62838 Other muscle spasm: Secondary | ICD-10-CM | POA: Diagnosis not present

## 2015-07-27 DIAGNOSIS — F3162 Bipolar disorder, current episode mixed, moderate: Secondary | ICD-10-CM

## 2015-07-27 DIAGNOSIS — R260 Ataxic gait: Secondary | ICD-10-CM | POA: Diagnosis not present

## 2015-07-27 DIAGNOSIS — R531 Weakness: Secondary | ICD-10-CM | POA: Diagnosis not present

## 2015-07-27 DIAGNOSIS — G3184 Mild cognitive impairment, so stated: Secondary | ICD-10-CM | POA: Diagnosis not present

## 2015-07-27 DIAGNOSIS — R4781 Slurred speech: Secondary | ICD-10-CM | POA: Diagnosis not present

## 2015-07-27 DIAGNOSIS — N179 Acute kidney failure, unspecified: Secondary | ICD-10-CM | POA: Insufficient documentation

## 2015-07-27 HISTORY — DX: Other symptoms and signs involving the musculoskeletal system: R29.898

## 2015-07-27 LAB — URINALYSIS, ROUTINE W REFLEX MICROSCOPIC
Bilirubin Urine: NEGATIVE
Glucose, UA: NEGATIVE mg/dL
Hgb urine dipstick: NEGATIVE
KETONES UR: NEGATIVE mg/dL
LEUKOCYTES UA: NEGATIVE
NITRITE: NEGATIVE
PH: 5.5 (ref 5.0–8.0)
Protein, ur: 30 mg/dL — AB
Specific Gravity, Urine: 1.015 (ref 1.005–1.030)

## 2015-07-27 LAB — COMPREHENSIVE METABOLIC PANEL
ALBUMIN: 3.1 g/dL — AB (ref 3.5–5.0)
ALK PHOS: 50 U/L (ref 38–126)
ALT: 70 U/L — AB (ref 17–63)
AST: 65 U/L — AB (ref 15–41)
Anion gap: 7 (ref 5–15)
BUN: 11 mg/dL (ref 6–20)
CALCIUM: 9.5 mg/dL (ref 8.9–10.3)
CHLORIDE: 108 mmol/L (ref 101–111)
CO2: 21 mmol/L — AB (ref 22–32)
CREATININE: 1.55 mg/dL — AB (ref 0.61–1.24)
GFR calc Af Amer: 56 mL/min — ABNORMAL LOW (ref >60)
GFR calc non Af Amer: 48 mL/min — ABNORMAL LOW (ref >60)
GLUCOSE: 120 mg/dL — AB (ref 65–99)
Potassium: 3.4 mmol/L — ABNORMAL LOW (ref 3.5–5.1)
SODIUM: 136 mmol/L (ref 135–145)
Total Bilirubin: 1 mg/dL (ref 0.3–1.2)
Total Protein: 6.8 g/dL (ref 6.5–8.1)

## 2015-07-27 LAB — CBC
HCT: 36.8 % — ABNORMAL LOW (ref 39.0–52.0)
HCT: 34.3 % — ABNORMAL LOW (ref 39.0–52.0)
Hemoglobin: 11.9 g/dL — ABNORMAL LOW (ref 13.0–17.0)
Hemoglobin: 11 g/dL — ABNORMAL LOW (ref 13.0–17.0)
MCH: 32.2 pg (ref 26.0–34.0)
MCH: 31.9 pg (ref 26.0–34.0)
MCHC: 32.3 g/dL (ref 30.0–36.0)
MCHC: 32.1 g/dL (ref 30.0–36.0)
MCV: 99.5 fL (ref 78.0–100.0)
MCV: 99.4 fL (ref 78.0–100.0)
PLATELETS: 139 10*3/uL — AB (ref 150–400)
PLATELETS: 122 10*3/uL — AB (ref 150–400)
RBC: 3.7 MIL/uL — ABNORMAL LOW (ref 4.22–5.81)
RBC: 3.45 MIL/uL — ABNORMAL LOW (ref 4.22–5.81)
RDW: 13.3 % (ref 11.5–15.5)
RDW: 13.6 % (ref 11.5–15.5)
WBC: 11.1 10*3/uL — ABNORMAL HIGH (ref 4.0–10.5)
WBC: 9.8 10*3/uL (ref 4.0–10.5)

## 2015-07-27 LAB — URINE MICROSCOPIC-ADD ON: RBC / HPF: NONE SEEN RBC/hpf (ref 0–5)

## 2015-07-27 LAB — BASIC METABOLIC PANEL
Anion gap: 7 (ref 5–15)
BUN: 10 mg/dL (ref 6–20)
CO2: 22 mmol/L (ref 22–32)
CREATININE: 1.44 mg/dL — AB (ref 0.61–1.24)
Calcium: 9.1 mg/dL (ref 8.9–10.3)
Chloride: 107 mmol/L (ref 101–111)
GFR calc Af Amer: >60 mL/min (ref >60)
GFR, EST NON AFRICAN AMERICAN: 53 mL/min — AB (ref >60)
Glucose, Bld: 103 mg/dL — ABNORMAL HIGH (ref 65–99)
Potassium: 3.1 mmol/L — ABNORMAL LOW (ref 3.5–5.1)
SODIUM: 136 mmol/L (ref 135–145)

## 2015-07-27 LAB — RAPID URINE DRUG SCREEN, HOSP PERFORMED
Amphetamines: NOT DETECTED
Barbiturates: NOT DETECTED
Benzodiazepines: NOT DETECTED
Cocaine: NOT DETECTED
OPIATES: NOT DETECTED
TETRAHYDROCANNABINOL: POSITIVE — AB

## 2015-07-27 LAB — LIPID PANEL
Cholesterol: 187 mg/dL (ref 0–200)
HDL: 68 mg/dL (ref >40)
LDL Cholesterol: 89 mg/dL (ref 0–99)
Total CHOL/HDL Ratio: 2.8 RATIO
Triglycerides: 152 mg/dL — ABNORMAL HIGH (ref <150)
VLDL: 30 mg/dL (ref 0–40)

## 2015-07-27 LAB — OCCULT BLOOD X 1 CARD TO LAB, STOOL: FECAL OCCULT BLD: NEGATIVE

## 2015-07-27 LAB — LITHIUM LEVEL: Lithium Lvl: 0.78 mmol/L (ref 0.60–1.20)

## 2015-07-27 LAB — AMMONIA: Ammonia: 37 umol/L — ABNORMAL HIGH (ref 9–35)

## 2015-07-27 LAB — CK: CK TOTAL: 561 U/L — AB (ref 49–397)

## 2015-07-27 MED ORDER — ENOXAPARIN SODIUM 40 MG/0.4ML ~~LOC~~ SOLN
40.0000 mg | Freq: Every day | SUBCUTANEOUS | Status: DC
  Administered 2015-07-27 – 2015-07-29 (×4): 40 mg via SUBCUTANEOUS
  Filled 2015-07-27 (×4): qty 0.4

## 2015-07-27 MED ORDER — ASPIRIN 81 MG PO CHEW
81.0000 mg | CHEWABLE_TABLET | Freq: Every day | ORAL | Status: DC
  Administered 2015-07-27 – 2015-07-30 (×4): 81 mg via ORAL
  Filled 2015-07-27 (×5): qty 1

## 2015-07-27 MED ORDER — LITHIUM CARBONATE ER 450 MG PO TBCR
450.0000 mg | EXTENDED_RELEASE_TABLET | Freq: Every day | ORAL | Status: DC
  Administered 2015-07-27 – 2015-07-30 (×4): 450 mg via ORAL
  Filled 2015-07-27 (×4): qty 1

## 2015-07-27 MED ORDER — SENNOSIDES-DOCUSATE SODIUM 8.6-50 MG PO TABS: 1.0000 | ORAL_TABLET | Freq: Every evening | ORAL | Status: DC | PRN

## 2015-07-27 MED ORDER — ADULT MULTIVITAMIN W/MINERALS CH
1.0000 | ORAL_TABLET | Freq: Every day | ORAL | Status: DC
  Administered 2015-07-27 – 2015-07-30 (×4): 1 via ORAL
  Filled 2015-07-27 (×4): qty 1

## 2015-07-27 MED ORDER — METOPROLOL TARTRATE 50 MG PO TABS: 100.0000 mg | ORAL_TABLET | Freq: Two times a day (BID) | ORAL | Status: DC

## 2015-07-27 MED ORDER — STROKE: EARLY STAGES OF RECOVERY BOOK
Freq: Once | Status: AC
  Administered 2015-07-27: 03:00:00
  Filled 2015-07-27: qty 1

## 2015-07-27 MED ORDER — FLUOXETINE HCL 20 MG PO CAPS
40.0000 mg | ORAL_CAPSULE | Freq: Every day | ORAL | Status: DC
  Administered 2015-07-27 – 2015-07-29 (×4): 40 mg via ORAL
  Filled 2015-07-27 (×4): qty 2

## 2015-07-27 MED ORDER — POTASSIUM CHLORIDE CRYS ER 20 MEQ PO TBCR
40.0000 meq | EXTENDED_RELEASE_TABLET | Freq: Once | ORAL | Status: AC
  Administered 2015-07-27: 40 meq via ORAL
  Filled 2015-07-27: qty 2

## 2015-07-27 MED ORDER — THIAMINE HCL 100 MG/ML IJ SOLN: 100.0000 mg | Freq: Every day | INTRAMUSCULAR | Status: DC

## 2015-07-27 MED ORDER — NICOTINE 21 MG/24HR TD PT24
21.0000 mg | MEDICATED_PATCH | Freq: Every day | TRANSDERMAL | Status: DC
  Administered 2015-07-27 – 2015-07-30 (×4): 21 mg via TRANSDERMAL
  Filled 2015-07-27 (×4): qty 1

## 2015-07-27 MED ORDER — PANTOPRAZOLE SODIUM 40 MG PO TBEC
40.0000 mg | DELAYED_RELEASE_TABLET | Freq: Every day | ORAL | Status: DC
  Administered 2015-07-27 – 2015-07-30 (×4): 40 mg via ORAL
  Filled 2015-07-27 (×4): qty 1

## 2015-07-27 MED ORDER — FOLIC ACID 1 MG PO TABS
1.0000 mg | ORAL_TABLET | Freq: Every day | ORAL | Status: DC
  Administered 2015-07-27 – 2015-07-30 (×4): 1 mg via ORAL
  Filled 2015-07-27 (×4): qty 1

## 2015-07-27 MED ORDER — LACTULOSE 10 GM/15ML PO SOLN: 30.0000 g | Freq: Every day | ORAL | Status: DC | PRN

## 2015-07-27 MED ORDER — VITAMIN B-1 100 MG PO TABS
100.0000 mg | ORAL_TABLET | Freq: Every day | ORAL | Status: DC
  Administered 2015-07-27 – 2015-07-30 (×4): 100 mg via ORAL
  Filled 2015-07-27 (×5): qty 1

## 2015-07-27 MED ORDER — ATORVASTATIN CALCIUM 10 MG PO TABS
20.0000 mg | ORAL_TABLET | Freq: Every day | ORAL | Status: DC
  Administered 2015-07-27 – 2015-07-28 (×2): 20 mg via ORAL
  Filled 2015-07-27 (×2): qty 2

## 2015-07-27 MED ORDER — SODIUM CHLORIDE 0.9 % IV SOLN
INTRAVENOUS | Status: DC
  Administered 2015-07-27 – 2015-07-28 (×3): via INTRAVENOUS

## 2015-07-27 MED ORDER — ALLOPURINOL 100 MG PO TABS
300.0000 mg | ORAL_TABLET | Freq: Every day | ORAL | Status: DC
  Administered 2015-07-27 – 2015-07-30 (×4): 300 mg via ORAL
  Filled 2015-07-27 (×4): qty 3

## 2015-07-27 NOTE — Consult Note (Signed)
Requesting Physician: Dr.  Randolm Idol    Reason for consultation:  To evaluate ambulatory dysfunction  HPI:                                                                                                                                         [name] [name] is an 55 y.o. male patient gait instability. PMH HTN, Hepatic Encephalopathy, TBI in 1997, Bipolar, Alcohol Abuse, and Tobacco Dependence admitted after fall at home and worsening dysarthria/left lower extremity weakness. Patient reports a history of frequent mechanical falls at home. He states that he was walking to the bathroom and fell, no associated chest pain/sob, patient unsure if he had loc, was down approximately 5 hours as was unable to move/ambulate. Neurology service is consulted for further evaluation of his gait dysfunction.  Past Medical History: Past Medical History  Diagnosis Date  . Hypertension   . Diabetes mellitus   . Hyperkalemia 12/08/2014  . Bipolar disorder (HCC)   . Alcohol abuse   . Gastric ulcer   . Hepatic encephalopathy [facility])     Past Surgical History  Procedure Laterality Date  . Esophagogastroduodenoscopy (egd) with propofol Left 12/09/2014    Procedure: ESOPHAGOGASTRODUODENOSCOPY (EGD) WITH PROPOFOL;  Surgeon: Bernette Redbird, MD;  Location: [facility] ENDOSCOPY;  Service: Endoscopy;  Laterality: Left;    Family History: No family history on file.  Social History:   reports that he has been smoking Cigarettes.  He has a 15 pack-year smoking history. He has never used smokeless tobacco. He reports that he drinks about 8.4 oz of alcohol per week. He reports that he does not use illicit drugs.  Allergies:  Allergies  Allergen Reactions  . Ace Inhibitors     REACTION: angioedema  . Penicillins     REACTION: rash     Medications:                                                                                                                         Current facility-administered medications:  .  0.9 %   sodium chloride infusion, , Intravenous, Continuous, Asiyah Mayra Reel, MD, Last Rate: 125 mL/hr at 07/27/15 1900 .  allopurinol (ZYLOPRIM) tablet 300 mg, 300 mg, Oral, Daily, Asiyah Mayra Reel, MD, 300 mg at 07/27/15 0947 .  aspirin chewable tablet 81 mg, 81 mg, Oral, Daily, Asiyah Mayra Reel, MD, 81 mg at  07/27/15 0204 .  atorvastatin (LIPITOR) tablet 20 mg, 20 mg, Oral, Daily, Asiyah Mayra Reel, MD, 20 mg at 07/27/15 0947 .  enoxaparin (LOVENOX) injection 40 mg, 40 mg, Subcutaneous, QHS, Asiyah Mayra Reel, MD, 40 mg at 07/27/15 0204 .  FLUoxetine (PROZAC) capsule 40 mg, 40 mg, Oral, QHS, Asiyah Mayra Reel, MD, 40 mg at 07/27/15 0204 .  folic acid (FOLVITE) tablet 1 mg, 1 mg, Oral, Daily, Asiyah Mayra Reel, MD, 1 mg at 07/27/15 0947 .  lactulose (CHRONULAC) 10 GM/15ML solution 30 g, 30 g, Oral, Daily PRN, Asiyah Mayra Reel, MD .  lithium carbonate (ESKALITH) CR tablet 450 mg, 450 mg, Oral, Daily, Asiyah Mayra Reel, MD, 450 mg at 07/27/15 0947 .  multivitamin with minerals tablet 1 tablet, 1 tablet, Oral, Daily, Asiyah Mayra Reel, MD, 1 tablet at 07/27/15 0947 .  nicotine (NICODERM CQ - dosed in mg/24 hours) patch 21 mg, 21 mg, Transdermal, Daily, Beaulah Dinning, MD, 21 mg at 07/27/15 0945 .  pantoprazole (PROTONIX) EC tablet 40 mg, 40 mg, Oral, Daily, Asiyah Mayra Reel, MD, 40 mg at 07/27/15 0947 .  senna-docusate (Senokot-S) tablet 1 tablet, 1 tablet, Oral, QHS PRN, Asiyah Mayra Reel, MD .  thiamine (VITAMIN B-1) tablet 100 mg, 100 mg, Oral, Daily, 100 mg at 07/27/15 0947 **OR** thiamine (B-1) injection 100 mg, 100 mg, Intravenous, Daily, Asiyah Mayra Reel, MD   ROS:                                                                                                                                       History obtained from the patient  General ROS: negative for - chills, fatigue, fever, night sweats, weight gain or weight loss Psychological ROS: negative for -  behavioral disorder, hallucinations, memory difficulties, mood swings or suicidal ideation Ophthalmic ROS: negative for - blurry vision, double vision, eye pain or loss of vision ENT ROS: negative for - epistaxis, nasal discharge, oral lesions, sore throat, tinnitus or vertigo Allergy and Immunology ROS: negative for - hives or itchy/watery eyes Hematological and Lymphatic ROS: negative for - bleeding problems, bruising or swollen lymph nodes Endocrine ROS: negative for - galactorrhea, hair pattern changes, polydipsia/polyuria or temperature intolerance Respiratory ROS: negative for - cough, hemoptysis, shortness of breath or wheezing Cardiovascular ROS: negative for - chest pain, dyspnea on exertion, edema or irregular heartbeat Gastrointestinal ROS: negative for - abdominal pain, diarrhea, hematemesis, nausea/vomiting or stool incontinence Genito-Urinary ROS: negative for - dysuria, hematuria, incontinence or urinary frequency/urgency Musculoskeletal ROS: negative for - joint swelling or muscular weakness Neurological ROS: as noted in HPI Dermatological ROS: negative for rash and skin lesion changes  Neurologic Examination:  Today's Vitals   07/27/15 0951 07/27/15 1200 07/27/15 1418 07/27/15 1559  BP:  120/69 115/69 125/85  Pulse:  74 77 79  Temp:  98.7 F (37.1 C) 99 F (37.2 C) 98.3 F (36.8 C)  TempSrc:  Oral Oral Oral  Resp:  Height: 6' (1.829 m)     Weight: 109.181 kg (240 lb 11.2 oz)     SpO2:  97% 95% 97%  PainSc:  0-No pain 0-No pain 0-No pain    Evaluation of higher integrative functions including: Level of alertness: Alert,  Oriented to time, place and person Speech: fluent, no evidence of dysarthria or aphasia noted.  Test the following cranial nerves: 2-12 grossly intact Motor examination: mild to moderate increased tone in 4 extremities , full 5/5 motor  strength in all 4 extremities Examination of sensation : Length dependent sensory loss up to knees bilaterally symmetric Examination of deep tendon reflexes: Brisk, normal plantars bilaterally Test coordination: Normal finger nose testing, with no evidence of limb appendicular ataxia. Gait: Spastic ataxic gait    Lab Results: Basic Metabolic Panel:  Recent Labs Lab 07/26/15 2204 07/26/15 2211 07/27/15 0209 07/27/15 0528  NA 136 140 136 136  K 3.9 4.0 3.4* 3.1*  CL 111 110 108 107  CO2 19*  --  21* 22  GLUCOSE 133* 133* 120* 103*  BUN CREATININE 1.76* 1.60* 1.55* 1.44*  CALCIUM 9.6  --  9.5 9.1    Liver Function Tests:  Recent Labs Lab 07/26/15 2204 07/27/15 0209  AST 71* 65*  ALT 76* 70*  ALKPHOS 56 50  BILITOT 1.1 1.0  PROT 6.8 6.8  ALBUMIN 3.2* 3.1*   No results for input(s): LIPASE, AMYLASE in the last 168 hours.  Recent Labs Lab 07/26/15 2214 07/27/15 0210  AMMONIA 51* 37*    CBC:  Recent Labs Lab 07/26/15 2204 07/26/15 2211 07/27/15 0209 07/27/15 0528  WBC 10.2  --  11.1* 9.8  NEUTROABS 8.5*  --   --   --   HGB 12.5* 14.3 11.9* 11.0*  HCT 38.5* 42.0 36.8* 34.3*  MCV 98.7  --  99.5 99.4  PLT 133*  --  139* 122*    Cardiac Enzymes:  Recent Labs Lab 07/27/15 0209  CKTOTAL 561*    Lipid Panel:  Recent Labs Lab 07/27/15 0210  CHOL 187  TRIG 152*  HDL 68  CHOLHDL 2.8  VLDL 30  LDLCALC 89    CBG:  Recent Labs Lab 07/26/15 2306  GLUCAP 130*    Microbiology: Results for orders placed or performed during the hospital encounter of 08/10/10  Pathologist smear review     Status: None   Collection Time: 08/10/10  2:45 PM  Result Value Ref Range Status   Tech Review NEUTROPHILIA WITH LEFT SHIFT  Final    Comment: LYPHOCYTIC ANEMIA SLIGHT THROMBOCYTOSIS Reviewed by Beulah Gandy. Luisa Hart, M.D. 6.14.12  Urine culture     Status: None   Collection Time: 08/10/10  3:15 PM  Result Value Ref Range Status   Specimen  Description URINE, CLEAN CATCH  Final   Special Requests NONE  Final   Culture  Setup Time [id number]  Final   Colony Count NO GROWTH  Final   Culture NO GROWTH  Final   Report Status 08/11/2010 FINAL  Final  Culture, blood (routine x 2)     Status: None   Collection Time: 08/10/10  4:55 PM  Result Value Ref Range Status  Specimen Description BLOOD HAND RIGHT  Final   Special Requests BOTTLES DRAWN AEROBIC ONLY [facility]  Final   Culture  Setup Time [id number]  Final   Culture NO GROWTH 5 DAYS  Final   Report Status 08/17/2010 FINAL  Final  Culture, blood (routine x 2)     Status: None   Collection Time: 08/10/10  5:10 PM  Result Value Ref Range Status   Specimen Description BLOOD HAND RIGHT  Final   Special Requests BOTTLES DRAWN AEROBIC AND ANAEROBIC 10CC  Final   Culture  Setup Time [id number]  Final   Culture NO GROWTH 5 DAYS  Final   Report Status 08/17/2010 FINAL  Final  MRSA PCR Screening     Status: None   Collection Time: 08/10/10  8:15 PM  Result Value Ref Range Status   MRSA by PCR NEGATIVE NEGATIVE Final    Comment:        The GeneXpert MRSA Assay (FDA approved for NASAL specimens only), is one component of a comprehensive MRSA colonization surveillance program. It is not intended to diagnose MRSA infection nor to guide or monitor treatment for MRSA infections.  Culture, respiratory     Status: None   Collection Time: 08/11/10  5:30 AM  Result Value Ref Range Status   Specimen Description ENDOTRACHEAL  Final   Special Requests NONE  Final   Gram Stain   Final    FEW WBC PRESENT, PREDOMINANTLY PMN RARE SQUAMOUS EPITHELIAL CELLS PRESENT ABUNDANT GRAM POSITIVE COCCI IN PAIRS AND CHAINS RARE GRAM NEGATIVE RODS   Culture Non-Pathogenic Oropharyngeal-type Flora Isolated.  Final   Report Status 08/13/2010 FINAL  Final  Urine culture     Status: None   Collection Time: 08/14/10  9:07 PM  Result Value Ref Range Status   Specimen Description URINE, CLEAN CATCH   Final   Special Requests NONE  Final   Culture  Setup Time [id number]  Final   Colony Count NO GROWTH  Final   Culture NO GROWTH  Final   Report Status 08/15/2010 FINAL  Final     Imaging: Dg Chest 2 View  07/26/2015  CLINICAL DATA:  Fall. EXAM: CHEST  2 VIEW COMPARISON:  08/14/2010 FINDINGS: Borderline cardiomegaly, likely accentuated by AP technique. The lungs are clear. Pulmonary vasculature is normal. No consolidation, pleural effusion, or pneumothorax. Remote left rib fractures are again seen. No acute osseous abnormalities are seen. IMPRESSION: No acute abnormality in the chest. Electronically Signed   By: Rubye OaksMelanie  Ehinger M.D.   On: 07/26/2015 23:00   Ct Head Wo Contrast  07/26/2015  CLINICAL DATA:  Found down EXAM: CT HEAD WITHOUT CONTRAST CT CERVICAL SPINE WITHOUT CONTRAST TECHNIQUE: Multidetector CT imaging of the head and cervical spine was performed following the standard protocol without intravenous contrast. Multiplanar CT image reconstructions of the cervical spine were also generated. COMPARISON:  MRI brain dated 08/11/2010 FINDINGS: CT HEAD FINDINGS No evidence of parenchymal hemorrhage or extra-axial fluid collection. No mass lesion, mass effect, or midline shift. No CT evidence of acute infarction. Old lacunar infarct in the left corona radiata. Subcortical white matter and periventricular small vessel ischemic changes. Global cortical atrophy.  No ventriculomegaly. The visualized paranasal sinuses are essentially clear. Minimal partial opacification of the left mastoid air cells. No evidence of calvarial fracture. CT CERVICAL SPINE FINDINGS Mild straightening of the normal cervical lordosis. No evidence of fracture or dislocation. Vertebral body heights and intervertebral disc spaces are maintained. Dens appears intact. No prevertebral soft tissue swelling.  Mild degenerative changes of the mid cervical spine. Visualized thyroid is unremarkable. Visualized lung apices are clear.  IMPRESSION: No evidence of acute intracranial abnormality. Old lacunar infarct in the left corona radiata. Atrophy with small vessel ischemic changes. No evidence of traumatic injury to the cervical spine. Mild degenerative changes. Electronically Signed   By: Charline Bills M.D.   On: 07/26/2015 23:22   Ct Cervical Spine Wo Contrast  07/26/2015  CLINICAL DATA:  Found down EXAM: CT HEAD WITHOUT CONTRAST CT CERVICAL SPINE WITHOUT CONTRAST TECHNIQUE: Multidetector CT imaging of the head and cervical spine was performed following the standard protocol without intravenous contrast. Multiplanar CT image reconstructions of the cervical spine were also generated. COMPARISON:  MRI brain dated 08/11/2010 FINDINGS: CT HEAD FINDINGS No evidence of parenchymal hemorrhage or extra-axial fluid collection. No mass lesion, mass effect, or midline shift. No CT evidence of acute infarction. Old lacunar infarct in the left corona radiata. Subcortical white matter and periventricular small vessel ischemic changes. Global cortical atrophy.  No ventriculomegaly. The visualized paranasal sinuses are essentially clear. Minimal partial opacification of the left mastoid air cells. No evidence of calvarial fracture. CT CERVICAL SPINE FINDINGS Mild straightening of the normal cervical lordosis. No evidence of fracture or dislocation. Vertebral body heights and intervertebral disc spaces are maintained. Dens appears intact. No prevertebral soft tissue swelling. Mild degenerative changes of the mid cervical spine. Visualized thyroid is unremarkable. Visualized lung apices are clear. IMPRESSION: No evidence of acute intracranial abnormality. Old lacunar infarct in the left corona radiata. Atrophy with small vessel ischemic changes. No evidence of traumatic injury to the cervical spine. Mild degenerative changes. Electronically Signed   By: Charline Bills M.D.   On: 07/26/2015 23:22   Mr Brain Wo Contrast  07/27/2015  CLINICAL DATA:   Initial evaluation for acute left lower extremity weakness, fall. EXAM: MRI HEAD WITHOUT CONTRAST MRA HEAD WITHOUT CONTRAST TECHNIQUE: Multiplanar, multiecho pulse sequences of the brain and surrounding structures were obtained without intravenous contrast. Angiographic images of the head were obtained using MRA technique without contrast. COMPARISON:  Prior CT from 07/26/2015. FINDINGS: MRI HEAD FINDINGS Diffuse prominence of the CSF containing spaces is compatible with generalized cerebral atrophy, advanced for patient age. Patchy T2/FLAIR hyperintensity within the periventricular and deep white matter both cerebral hemispheres most consistent with chronic small vessel ischemic disease, moderate nature. Superimposed remote lacunar infarcts within the bilateral basal ganglia/ corona radiata. Chronic small vessel ischemic changes present within the pons. Remote lacunar infarct within the left thalamus. No normal foci of restricted diffusion to suggest acute intracranial infarct. Gray-white matter differentiation maintained. Major intracranial vascular flow voids preserved. No acute intracranial hemorrhage. Susceptibility artifact within the anterior left frontal lobe compatible with remote hemorrhage, and may be related to remote ischemia and/or trauma. Small focus susceptibility artifact also present within the left parietal lobe. No mass lesion, midline shift, or mass effect. Ventricular prominence related to global parenchymal volume loss without hydrocephalus. No extra-axial fluid collection. Major dural sinuses are Adrian patent. Bifrontal encephalomalacia and likely related to remote trauma. Craniocervical junction within normal limits. Visualized upper cervical spine unremarkable. Pituitary gland normal.  No acute abnormality about the orbits. Minimal layering opacity within the right sphenoid sinus. Paranasal sinuses are otherwise clear. Bilateral mastoid effusions, left greater than right. Inner ear structures  grossly normal. Bone marrow signal intensity within normal limits. No scalp soft tissue abnormality. MRA HEAD FINDINGS ANTERIOR CIRCULATION: Visualized distal cervical segments of the internal carotid arteries are patent with antegrade  flow. Petrous, cavernous, and supraclinoid segments patent bilaterally without flow-limiting stenosis. A1 segments patent. Right A1 segment hypoplastic. Anterior communicating artery normal. Anterior cerebral arteries well opacified. M1 segments patent without stenosis or occlusion. MCA bifurcations normal. Distal MCA branches well opacified and symmetric. POSTERIOR CIRCULATION: Right vertebral artery dominant and patent to the vertebrobasilar junction. Left vertebral artery hypoplastic and not well visualized, likely terminates in PICA. Left posterior inferior cerebral artery is patent. Basilar artery widely patent. Superior cerebellar and posterior cerebral arteries patent bilaterally. No aneurysm or vascular malformation. IMPRESSION: MRI HEAD IMPRESSION: 1. No acute intracranial infarct or other process identified. 2. Remote lacunar infarcts involving the bilateral basal ganglia/corona radiata as well as the left thalamus. 3. 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 4. Generalized cerebral atrophy with moderate chronic small vessel ischemic disease, advanced for patient age. MRA HEAD IMPRESSION: 1. Negative intracranial MRA. No large or proximal arterial branch occlusion. No high-grade or correctable stenosis. 2. 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   On: 07/27/2015 06:59   Mr Maxine Glenn Head/brain Wo Cm  07/27/2015  CLINICAL DATA:  Initial evaluation for acute left lower extremity weakness, fall. EXAM: MRI HEAD WITHOUT CONTRAST MRA HEAD WITHOUT CONTRAST TECHNIQUE: Multiplanar, multiecho pulse sequences of the brain and surrounding structures were obtained without intravenous contrast.  Angiographic images of the head were obtained using MRA technique without contrast. COMPARISON:  Prior CT from 07/26/2015. FINDINGS: MRI HEAD FINDINGS Diffuse prominence of the CSF containing spaces is compatible with generalized cerebral atrophy, advanced for patient age. Patchy T2/FLAIR hyperintensity within the periventricular and deep white matter both cerebral hemispheres most consistent with chronic small vessel ischemic disease, moderate nature. Superimposed remote lacunar infarcts within the bilateral basal ganglia/ corona radiata. Chronic small vessel ischemic changes present within the pons. Remote lacunar infarct within the left thalamus. No normal foci of restricted diffusion to suggest acute intracranial infarct. Gray-white matter differentiation maintained. Major intracranial vascular flow voids preserved. No acute intracranial hemorrhage. Susceptibility artifact within the anterior left frontal lobe compatible with remote hemorrhage, and may be related to remote ischemia and/or trauma. Small focus susceptibility artifact also present within the left parietal lobe. No mass lesion, midline shift, or mass effect. Ventricular prominence related to global parenchymal volume loss without hydrocephalus. No extra-axial fluid collection. Major dural sinuses are Wilson patent. Bifrontal encephalomalacia and likely related to remote trauma. Craniocervical junction within normal limits. Visualized upper cervical spine unremarkable. Pituitary gland normal.  No acute abnormality about the orbits. Minimal layering opacity within the right sphenoid sinus. Paranasal sinuses are otherwise clear. Bilateral mastoid effusions, left greater than right. Inner ear structures grossly normal. Bone marrow signal intensity within normal limits. No scalp soft tissue abnormality. MRA HEAD FINDINGS ANTERIOR CIRCULATION: Visualized distal cervical segments of the internal carotid arteries are patent with antegrade flow. Petrous,  cavernous, and supraclinoid segments patent bilaterally without flow-limiting stenosis. A1 segments patent. Right A1 segment hypoplastic. Anterior communicating artery normal. Anterior cerebral arteries well opacified. M1 segments patent without stenosis or occlusion. MCA bifurcations normal. Distal MCA branches well opacified and symmetric. POSTERIOR CIRCULATION: Right vertebral artery dominant and patent to the vertebrobasilar junction. Left vertebral artery hypoplastic and not well visualized, likely terminates in PICA. Left posterior inferior cerebral artery is patent. Basilar artery widely patent. Superior cerebellar and posterior cerebral arteries patent bilaterally. No aneurysm or vascular malformation. IMPRESSION: MRI HEAD IMPRESSION: 1. No acute intracranial infarct or other process identified. 2. Remote lacunar infarcts  involving the bilateral basal ganglia/corona radiata as well as the left thalamus. 3. 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 4. Generalized cerebral atrophy with moderate chronic small vessel ischemic disease, advanced for patient age. MRA HEAD IMPRESSION: 1. Negative intracranial MRA. No large or proximal arterial branch occlusion. No high-grade or correctable stenosis. 2. 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   On: 07/27/2015 06:59    Assessment and plan:   [name] [name] is an 55 y.o. male patient who presented with gait instability, noted to have spastic ataxic gait. MRI of the brain negative for acute pathology. Evidence of chronic traumatic injury noted, but not severe enough to explain the gait abnormalities.  Recommend MRI of the cervical spine to rule out cervical spinal canal stenosis contributing to the spastic gait abnormality. Recommend continuing physical, occupational therapy.  We'll follow-up after completion of his C-spine MRI.

## 2015-07-27 NOTE — Discharge Summary (Signed)
Family Medicine Teaching [facility] Discharge Summary  Patient name: [name] [name] Medical record number: [id number] Date of birth: [birth year] Age: 55 y.o. Gender: male Date of Admission: 07/26/2015  Date of Discharge: 07/30/15 Admitting Physician: Uvaldo Rising, MD  Primary Care Provider: Mickie Hillier, MD Consultants:   Indication for Hospitalization:   Discharge Diagnoses/Problem List:  Possible TIA  Disposition: Home with advanced home care  Discharge Condition: Improved  Discharge Exam: Please refer to progress note from day of discharge  Brief Hospital Course:  [name] [name] is a 55 y.o. male presenting following a fall with acute onset of worsening weakness in left lower extremity. PMH is significant for HTN, hepatomegaly, hepatic encephalopathy, Biopolar disorder, Anemia, alcoholism, tobacco dependence, hx of TBI   Patient admitted after acute worsening of LLE weakness causing a fall. On admission, patient had hyperreflexia in LLE, 4/5 strength in both lower extremities. Dysmetria on right/left side. Worsened Dysarthria (patient has some baseline slurring). Rectal sphincter tone was slightly reduced. Head CT showed no evidence of acute intracranial abnormality (did show old lacunar infarct in the left corona radiata and atrophy with small vessel ischemic changes). MRI showed no signs of infarct but generalized atrophy seen. Troponin negative x 3. EKG with no ischemic changes. Cervical CT showed no spinal compression or edema; did show segmental stenosis. Patient also noted to have a hx of urinary/bowel incontinence over the last 2 months. However no saddle anesthesia,back pain, weight loss, or fever/chills. Lumbar MRI ordered to evaluate for any compression or nerve injury and it showed no acute abnormality; imaging did show moderate canal stenosis at L3-4 and L4-5 related to mild disc bulging, facet disease, superimposed short pedicles, and mild bilateral foraminal narrowing.  Mini Cog was performed, patient only scored 1 and was noted to have left hemi neglect after drawing the face of the clock. His weakness and dysarthria resolved on subsequent exam. PT/OT evaluated patient and noted of generalized weakness, LLE > RLE incoordination, impaired balance, cognitive deficit and impaired mobility; PT/OT recommended CIR.  Neurology was consulted and recommended Baclofen 10mg  QAM and 10mg  qHS for spastic ataxic gait. Patient was unable to go to CIR due to insurance issues (unable to keep in the hospital over a weekend since medically ready). Patient declined SNF and opted to go home with home health.  Additionally, with history of recent falls the following were also evaluated: patient has a history of hepatic encephalopathy, sometimes compliant with lactulose per review of family medicine notes. On admission, no asterixis noted, however ammonia elevated to 55. Per patient, he takes Lactulose PRN mild constipation. Lactulose was not given, and ammonia levels decreased to 37. He had regular bowel movements through out hospitalization.   His home blood pressure medications were initially held to allow permissive hypertension. Home medications were eventually added back.   His electrolytes were repleated (Potassium and Magnesium) as needed.   On admission, patient reported blood in stool and dark stools. He has a history of upper GI bleeding in 11/2014, positive for duodneal ulcers at that time. Patient was asymptomatic. FOBT on admission was negative. Hemoglobin was up to 14.3 then went to 11/9 >>10.4, and has been stable at 10.4.   All other chronic medical conditions stable throughout admission and managed with home regimens.  Issues for Follow Up:  - consider colonoscopy (for screening and also for report of dark stools). Additionally, MCV seems slightly elevated. Consider checking Folate and Vitamin B12 - per patient, is taking Lactulose PRN mild  constipation (however, the  original prescription was for daily; per notes, patient agreed to take 2-3 times per week)  - history of TBI; mini cog score was 1 (with left hemineglect); consider referral to geri clinic - consider imaging of thoracic spine to further evaluate gait issue  Significant Procedures: none  Significant Labs and Imaging:   Recent Labs Lab 07/27/15 0528 07/28/15 0518 07/29/15 0546  WBC 9.8 7.0 6.6  HGB 11.0* 10.5* 10.4*  HCT 34.3* 33.5* 33.3*  PLT 122* 124* 138*    Recent Labs Lab 07/26/15 2204  07/27/15 0209 07/27/15 0528 07/28/15 0518 07/29/15 0546 07/30/15 0621  NA 136  < > 136 136 139 138 139  K 3.9  < > 3.4* 3.1* 3.4* 3.4* 3.8  CL 111  < > 108 107 112* 108 107  CO2 19*  --  21* GLUCOSE 133*  < > 120* 103* 108* 114* 124*  BUN 11  < > CREATININE 1.76*  < > 1.55* 1.44* 1.17 1.18 1.21  CALCIUM 9.6  --  9.5 9.1 8.8* 8.8* 9.0  MG  --   --   --   --   --  1.6* 1.7  ALKPHOS 56  --  50  --   --   --   --   AST 71*  --  65*  --   --   --   --   ALT 76*  --  70*  --   --   --   --   ALBUMIN 3.2*  --  3.1*  --   --   --   --   < > = values in this interval not displayed.CXR: IMPRESSION: No acute abnormality in the chest  CT Head:  IMPRESSION: No evidence of acute intracranial abnormality. Old lacunar infarct in the left corona radiata. Atrophy with small vessel ischemic changes.  No evidence of traumatic injury to the cervical spine. Mild degenerative changes.  CT Cervical Spine:  IMPRESSION: No evidence of acute intracranial abnormality. Old lacunar infarct in the left corona radiata. Atrophy with small vessel ischemic changes.  No evidence of traumatic injury to the cervical spine. Mild degenerative changes.   MRI Brain:  IMPRESSION: MRI HEAD IMPRESSION:  1. No acute intracranial infarct or other process identified. 2. Remote lacunar infarcts involving the bilateral basal ganglia/corona radiata as well as the left thalamus. 3.  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 4. Generalized cerebral atrophy with moderate chronic small vessel ischemic disease, advanced for patient age.  MRA HEAD IMPRESSION:  1. Negative intracranial MRA. No large or proximal arterial branch occlusion. No high-grade or correctable stenosis. 2. 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 No acute abnormality within the cervical spine. Normal appearance of the cervical spinal cord. 2. Degenerative disc bulges with resultant moderate canal stenosis at C3-4, C4-5, and C5-6 as detailed above. 3. Severe right foraminal narrowing at C3-4 and C5-6. More moderate right-sided foraminal narrowing at C4-5. 4. Severe left foraminal narrowing at C5-6, with more moderate left foraminal narrowing at C3-4 and C4-5.  MRI Lumbar Spine:  IMPRESSION: 1. No acute abnormality within the lumbar spine. 2. Moderate canal stenosis at L3-4 and L4-5 related to mild disc bulging, facet disease, and superimposed short pedicles. Mild bilateral foraminal narrowing at these levels as well. 3. No other significant degenerative changes within the lumbar Spine.  US Carotid:  Summary:  There is no obvious evidence of hemodynamically significant internal carotid artery stenosis bilaterally. Vertebral arteries are patent with antegrade flow.  Results/Tests Pending at Time of Discharge: none  Discharge Medications:    Medication List    TAKE these medications        allopurinol 300 MG tablet  Commonly known as:  ZYLOPRIM  Take 1 tablet (300 mg total) by mouth daily.     amLODipine 5 MG tablet  Commonly known as:  NORVASC  Take 1 tablet (5 mg total) by mouth daily.     amLODipine 5 MG tablet  Commonly known as:  NORVASC  Take 1 tablet (5 mg total) by mouth daily.     aspirin 81 MG chewable tablet  Chew 1 tablet (81 mg total) by mouth daily.     atorvastatin 20 MG tablet  Commonly  known as:  LIPITOR  Take 1 tablet (20 mg total) by mouth daily.     baclofen 10 MG tablet  Commonly known as:  LIORESAL  Take 1 tablet (10 mg total) by mouth 2 (two) times daily.     FLUoxetine 40 MG capsule  Commonly known as:  PROZAC  Take 40 mg by mouth at bedtime.     folic acid 1 MG tablet  Commonly known as:  FOLVITE  TAKE 1 TABLET (1 MG TOTAL) BY MOUTH DAILY.     lactulose 10 GM/15ML solution  Commonly known as:  CHRONULAC  Take 45 mLs (30 g total) by mouth daily.     lithium carbonate 450 MG CR tablet  Commonly known as:  ESKALITH  Take 1 tablet (450 mg total) by mouth daily. For mental health     metoprolol 100 MG tablet  Commonly known as:  LOPRESSOR  Take 1 tablet (100 mg total) by mouth 2 (two) times daily.     nicotine 14 mg/24hr patch  Commonly known as:  NICODERM CQ - dosed in mg/24 hours  Place 1 patch (14 mg total) onto the skin daily.     nicotine 21 mg/24hr patch  Commonly known as:  NICODERM CQ - dosed in mg/24 hours  Place 1 patch (21 mg total) onto the skin daily.     omega-3 acid ethyl esters 1 g capsule  Commonly known as:  LOVAZA  TAKE 1 CAPSULE (1 G TOTAL) BY MOUTH DAILY.     pantoprazole 40 MG tablet  Commonly known as:  PROTONIX  Take 1 tablet (40 mg total) by mouth daily.     senna-docusate 8.6-50 MG tablet  Commonly known as:  Senokot-S  Take 1 tablet by mouth at bedtime as needed for moderate constipation.     spironolactone 50 MG tablet  Commonly known as:  ALDACTONE  Take 1 tablet (50 mg total) by mouth daily.        Discharge Instructions: Please refer to Patient Instructions section of EMR for full details.  Patient was counseled important signs and symptoms that should prompt return to medical care, changes in medications, dietary instructions, activity restrictions, and follow up appointments.   Follow-Up Appointments: Follow-up Information    Follow up with Mickie HillierIan McKeag, MD On 08/12/2015.   Specialty:  Family Medicine    Why:  9:30 AM   Contact information:   [address] [place] [address] [phone]       Palma HolterKanishka G Antonea Gaut, MD 08/01/2015, 7:22 PM PGY-1, [facility]

## 2015-07-27 NOTE — Progress Notes (Signed)
Pt sitting up in the chair at this time with no noted distress. Instructed to ambulate pt per verbal from MD pt with unsteady gait pt was able to take a few steps to chair using rolling walker and gait belt staff x2.  Pt stated that the fall that occurred prior to him arriving to facility makes him nervous about  walking. Pt denies pain or discomfort. Safety measures in place. Call bell within reach. Will continue to monitor.

## 2015-07-27 NOTE — Progress Notes (Signed)
Patient arrived to 5C04 from the ED at 0105, A/OX3 with severe dysarthria and generalized weakness. Patient oriented to room, equipment and all safety measures. MAEW. VSS. Cardiac monitoring and stroke education initiated. Will monitor closely overnight.

## 2015-07-27 NOTE — Progress Notes (Signed)
Family Medicine Teaching Service Daily Progress Note Intern Pager: [phone]  Patient name: [name] [name] Medical record number: [id number] Date of birth: 06/28/1960 Age: 55 y.o. Gender: male  Primary Care Provider: Mickie HillierIan McKeag, MD Consultants: Neurology Code Status: Full  Pt Overview and Major Events to Date:  5/30: Admit to FPTS  Assessment and Plan: [name] Forestnthony L Tatem is a 55 y.o. male presenting following a fall with acute onset of worsening weakness in left lower extremity. PMH is significant for HTN, hepatomegaly, hepatic encephalopathy, Biopolar disorder, Anemia, alcoholism, tobacco dependence   Left leg weakness causing fall: Acute worsening of leg weakness L>R, no upper extremity weakness per patient. No changes in sensation or numbness. Neuro exam positive for hyperreflexia in LLE, 4/5 strength in both lower extremities. Dysmetria on right/left side. Worsened Dysarthria (patient has some baseline slurring). Additionally, today mini cog revealed left hemineglect and score of 1 (see picture below). Risk factors for stroke include 20 pack years of smoking, HTN. Patient with previous stroke in 1999 per family. Head CT showing no evidence of acute intracranial abnormality. Old lacunar infarct in the left corona radiata. Atrophy with small vessel ischemic changes. MRI no signs of infarct but generalized atrophy seen. Troponin negative x 3. EKG with no ischemic changes. Cervical CT with no spinal injury. Patient also noted to have a hx of urinary/bowel incontinence over the last 2 months. However no saddle anesthesia,back pain, weight loss, or fever/chills. Moderate Rectal tone. Acute stroke has been ruled out, would consider ruling out spinal cord compression as well.  - Risk stratification labs include HA1C and Lipid panel  - Neuro check q2 hours for 12 hours, followed q4hrs  - Carotids, ECHO pending - Neurology consulted, appreciate their assistance - MRI lumbar, thoracic--> ordered  today with and without contrast - telemetry  - repeat EKG  - Vitals per floor  - NPO pending bedside swallow--> passed and diet advanced - PT/OT/ST consulted   Urinary / Bowel Incontinence, with lower ext weakness See above A&P regarding possible CVA. Concern for additional problem with spinal cord compression or low back injury given incontinence x 2 months, without other symptoms of cauda equina (no saddle anesthesia) some rectal tone but it is reduced. No known low back problem / OA / surgery or trauma - Will proceed with Lumbar MRI to evaluate any compression or nerve injury maybe contributing to LE weakness, incontinence, reduced rectal tone  Hx of Recent Falls: Patient has hx of hepatic encephalopathy, sometimes compliant with lactulose per review of family medicine notes. On admission, no asterixis noted, however ammonia elevated to 55 this admission. Patient also with hx of alcohol abuse, UDS positive for THC, Ethanol less than > 5. Hx of bipolar disease, on Lithium. Lithium toxicity can cause neurologic symptoms as well, therefore with check level for possibly toxicity  - Lithium level--> 0.78 (normal) - CK level due to patient being down for 5 hours to check of rhabdo--> 561 - Will continue lactulose   AKI, on CKDII-III: SCr 1.60 (baseline 1.2) on admission, now 1.44, patient with dry mucosa membranes on admission. Received 500 ml bolus in the ED.   - Will follow BMET   Hx of GI bleed. Patient currently endorse blood in stool and dark stools. Hx of Upper GI bleeding 11/2014, positive for duodneal ulcers at that time. Hgb 12.5>14.3>11.0. Patient denies palpations, dizziness etc.  - FOBT while here--> negative  - Would consider outpatient work up with a colonoscopy  - monitor CBC  HTN, Home meds: metoprolol, spirolactone and Norvasc. Normotensive now - Holding spirolactone, metoprolol, Norvasc   Hx of alcohol use  - Denies recent alcohol use  - CIWA protocol   - Thiamine and Folate   Hepatic Encephalopathy: Ammonia 55, alert x self and place. Per family baseline does not know the month  - lactulose daily, titrate for bowel movement   Biopolar Disorder  - Continue Prozac and Lithium   Hypokalemia: K 3.1 this AM - 40 meq KDUR this AM - Trend BMP  FEN/GI: SLIV/Heart healthy PPx: Lovenox  Disposition: Continue to monitor, neurology recs  Subjective:  Patient has no complaints this morning. He feels as though his weakness has improved from yesterday. Denies any numbness. Denies any pain. Good appetite.  Objective: Temp:  [98.2 F (36.8 C)-98.4 F (36.9 C)] 98.2 F (36.8 C) (05/31 0600) Pulse Rate:  [67-91] 67 (05/31 0600) Resp:  [15-22] 19 (05/31 0600) BP: (108-139)/(71-95) 124/87 mmHg (05/31 0600) SpO2:  [96 %-98 %] 98 % (05/31 0600) Physical Exam: General: Patient sitting up in bed, NAD  Cardiovascular: RRR, no murmurs  Respiratory: Anterior only, CTAB, no wheezes  Abdomen: protuberant, BS+, no ttp, no distention Skin: Extensive Bruising on left upper extremity  Psych: flat affect, appropriate mood Neuro:  Mental Status: alert; oriented to person, place and year; knowledge is normal for age Cranial Nerves: extraocular movements are full and conjugate; pupils are round reactive to light; midline tongue and uvula, dysarthric speech Motor: Strength equal & normal in upper, 4/5 strength lower extremities (Left lower ext 4/5 with Right LE improved to 5/5) Sensory: grossly intact throughout Coordination: Heel to shin performed wnl, finger to nose not normal  Gait and Station: Not observed  Reflexes: LLE hyperreflexia, RLE normal reflexes, No clonus     Laboratory:  Recent Labs Lab 07/26/15 2204 07/26/15 2211 07/27/15 0209 07/27/15 0528  WBC 10.2  --  11.1* 9.8  HGB 12.5* 14.3 11.9* 11.0*  HCT 38.5* 42.0 36.8* 34.3*  PLT 133*  --  139* 122*    Recent Labs Lab 07/26/15 2204 07/26/15 2211 07/27/15 0209  07/27/15 0528  NA 136 140 136 136  K 3.9 4.0 3.4* 3.1*  CL 111 110 108 107  CO2 19*  --  21* 22  BUN 11 14 11 10   CREATININE 1.76* 1.60* 1.55* 1.44*  CALCIUM 9.6  --  9.5 9.1  PROT 6.8  --  6.8  --   BILITOT 1.1  --  1.0  --   ALKPHOS 56  --  50  --   ALT 76*  --  70*  --   AST 71*  --  65*  --   GLUCOSE 133* 133* 120* 103*    Imaging/Diagnostic Tests: Dg Chest 2 View  07/26/2015  CLINICAL DATA:  Fall. EXAM: CHEST  2 VIEW COMPARISON:  08/14/2010 FINDINGS: Borderline cardiomegaly, likely accentuated by AP technique. The lungs are clear. Pulmonary vasculature is normal. No consolidation, pleural effusion, or pneumothorax. Remote left rib fractures are again seen. No acute osseous abnormalities are seen. IMPRESSION: No acute abnormality in the chest. Electronically Signed   By: Rubye Oaks M.D.   On: 07/26/2015 23:00   Ct Head Wo Contrast  07/26/2015  CLINICAL DATA:  Found down EXAM: CT HEAD WITHOUT CONTRAST CT CERVICAL SPINE WITHOUT CONTRAST TECHNIQUE: Multidetector CT imaging of the head and cervical spine was performed following the standard protocol without intravenous contrast. Multiplanar CT image reconstructions of the cervical spine were also generated. COMPARISON:  MRI brain dated 08/11/2010 FINDINGS: CT HEAD FINDINGS No evidence of parenchymal hemorrhage or extra-axial fluid collection. No mass lesion, mass effect, or midline shift. No CT evidence of acute infarction. Old lacunar infarct in the left corona radiata. Subcortical white matter and periventricular small vessel ischemic changes. Global cortical atrophy.  No ventriculomegaly. The visualized paranasal sinuses are essentially clear. Minimal partial opacification of the left mastoid air cells. No evidence of calvarial fracture. CT CERVICAL SPINE FINDINGS Mild straightening of the normal cervical lordosis. No evidence of fracture or dislocation. Vertebral body heights and intervertebral disc spaces are maintained. Dens appears  intact. No prevertebral soft tissue swelling. Mild degenerative changes of the mid cervical spine. Visualized thyroid is unremarkable. Visualized lung apices are clear. IMPRESSION: No evidence of acute intracranial abnormality. Old lacunar infarct in the left corona radiata. Atrophy with small vessel ischemic changes. No evidence of traumatic injury to the cervical spine. Mild degenerative changes. Electronically Signed   By: Charline Bills M.D.   On: 07/26/2015 23:22   Ct Cervical Spine Wo Contrast  07/26/2015  CLINICAL DATA:  Found down EXAM: CT HEAD WITHOUT CONTRAST CT CERVICAL SPINE WITHOUT CONTRAST TECHNIQUE: Multidetector CT imaging of the head and cervical spine was performed following the standard protocol without intravenous contrast. Multiplanar CT image reconstructions of the cervical spine were also generated. COMPARISON:  MRI brain dated 08/11/2010 FINDINGS: CT HEAD FINDINGS No evidence of parenchymal hemorrhage or extra-axial fluid collection. No mass lesion, mass effect, or midline shift. No CT evidence of acute infarction. Old lacunar infarct in the left corona radiata. Subcortical white matter and periventricular small vessel ischemic changes. Global cortical atrophy.  No ventriculomegaly. The visualized paranasal sinuses are essentially clear. Minimal partial opacification of the left mastoid air cells. No evidence of calvarial fracture. CT CERVICAL SPINE FINDINGS Mild straightening of the normal cervical lordosis. No evidence of fracture or dislocation. Vertebral body heights and intervertebral disc spaces are maintained. Dens appears intact. No prevertebral soft tissue swelling. Mild degenerative changes of the mid cervical spine. Visualized thyroid is unremarkable. Visualized lung apices are clear. IMPRESSION: No evidence of acute intracranial abnormality. Old lacunar infarct in the left corona radiata. Atrophy with small vessel ischemic changes. No evidence of traumatic injury to the  cervical spine. Mild degenerative changes. Electronically Signed   By: Charline Bills M.D.   On: 07/26/2015 23:22   Mr Brain Wo Contrast  07/27/2015  CLINICAL DATA:  Initial evaluation for acute left lower extremity weakness, fall. EXAM: MRI HEAD WITHOUT CONTRAST MRA HEAD WITHOUT CONTRAST TECHNIQUE: Multiplanar, multiecho pulse sequences of the brain and surrounding structures were obtained without intravenous contrast. Angiographic images of the head were obtained using MRA technique without contrast. COMPARISON:  Prior CT from 07/26/2015. FINDINGS: MRI HEAD FINDINGS Diffuse prominence of the CSF containing spaces is compatible with generalized cerebral atrophy, advanced for patient age. Patchy T2/FLAIR hyperintensity within the periventricular and deep white matter both cerebral hemispheres most consistent with chronic small vessel ischemic disease, moderate nature. Superimposed remote lacunar infarcts within the bilateral basal ganglia/ corona radiata. Chronic small vessel ischemic changes present within the pons. Remote lacunar infarct within the left thalamus. No normal foci of restricted diffusion to suggest acute intracranial infarct. Gray-white matter differentiation maintained. Major intracranial vascular flow voids preserved. No acute intracranial hemorrhage. Susceptibility artifact within the anterior left frontal lobe compatible with remote hemorrhage, and may be related to remote ischemia and/or trauma. Small focus susceptibility artifact also present within the left parietal lobe. No mass lesion,  midline shift, or mass effect. Ventricular prominence related to global parenchymal volume loss without hydrocephalus. No extra-axial fluid collection. Major dural sinuses are [place] patent. Bifrontal encephalomalacia and likely related to remote trauma. Craniocervical junction within normal limits. Visualized upper cervical spine unremarkable. Pituitary gland normal.  No acute abnormality about the orbits.  Minimal layering opacity within the right sphenoid sinus. Paranasal sinuses are otherwise clear. Bilateral mastoid effusions, left greater than right. Inner ear structures grossly normal. Bone marrow signal intensity within normal limits. No scalp soft tissue abnormality. MRA HEAD FINDINGS ANTERIOR CIRCULATION: Visualized distal cervical segments of the internal carotid arteries are patent with antegrade flow. Petrous, cavernous, and supraclinoid segments patent bilaterally without flow-limiting stenosis. A1 segments patent. Right A1 segment hypoplastic. Anterior communicating artery normal. Anterior cerebral arteries well opacified. M1 segments patent without stenosis or occlusion. MCA bifurcations normal. Distal MCA branches well opacified and symmetric. POSTERIOR CIRCULATION: Right vertebral artery dominant and patent to the vertebrobasilar junction. Left vertebral artery hypoplastic and not well visualized, likely terminates in PICA. Left posterior inferior cerebral artery is patent. Basilar artery widely patent. Superior cerebellar and posterior cerebral arteries patent bilaterally. No aneurysm or vascular malformation. IMPRESSION: MRI HEAD IMPRESSION: 1. No acute intracranial infarct or other process identified. 2. Remote lacunar infarcts involving the bilateral basal ganglia/corona radiata as well as the left thalamus. 3. 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 4. Generalized cerebral atrophy with moderate chronic small vessel ischemic disease, advanced for patient age. MRA HEAD IMPRESSION: 1. Negative intracranial MRA. No large or proximal arterial branch occlusion. No high-grade or correctable stenosis. 2. 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   On: 07/27/2015 06:59   Mr Maxine Glenn Head/brain Wo Cm  07/27/2015  CLINICAL DATA:  Initial evaluation for acute left lower extremity weakness, fall. EXAM: MRI HEAD WITHOUT  CONTRAST MRA HEAD WITHOUT CONTRAST TECHNIQUE: Multiplanar, multiecho pulse sequences of the brain and surrounding structures were obtained without intravenous contrast. Angiographic images of the head were obtained using MRA technique without contrast. COMPARISON:  Prior CT from 07/26/2015. FINDINGS: MRI HEAD FINDINGS Diffuse prominence of the CSF containing spaces is compatible with generalized cerebral atrophy, advanced for patient age. Patchy T2/FLAIR hyperintensity within the periventricular and deep white matter both cerebral hemispheres most consistent with chronic small vessel ischemic disease, moderate nature. Superimposed remote lacunar infarcts within the bilateral basal ganglia/ corona radiata. Chronic small vessel ischemic changes present within the pons. Remote lacunar infarct within the left thalamus. No normal foci of restricted diffusion to suggest acute intracranial infarct. Gray-white matter differentiation maintained. Major intracranial vascular flow voids preserved. No acute intracranial hemorrhage. Susceptibility artifact within the anterior left frontal lobe compatible with remote hemorrhage, and may be related to remote ischemia and/or trauma. Small focus susceptibility artifact also present within the left parietal lobe. No mass lesion, midline shift, or mass effect. Ventricular prominence related to global parenchymal volume loss without hydrocephalus. No extra-axial fluid collection. Major dural sinuses are [place] patent. Bifrontal encephalomalacia and likely related to remote trauma. Craniocervical junction within normal limits. Visualized upper cervical spine unremarkable. Pituitary gland normal.  No acute abnormality about the orbits. Minimal layering opacity within the right sphenoid sinus. Paranasal sinuses are otherwise clear. Bilateral mastoid effusions, left greater than right. Inner ear structures grossly normal. Bone marrow signal intensity within normal limits. No scalp soft tissue  abnormality. MRA HEAD FINDINGS ANTERIOR CIRCULATION: Visualized distal cervical segments of the internal carotid arteries are patent with antegrade  flow. Petrous, cavernous, and supraclinoid segments patent bilaterally without flow-limiting stenosis. A1 segments patent. Right A1 segment hypoplastic. Anterior communicating artery normal. Anterior cerebral arteries well opacified. M1 segments patent without stenosis or occlusion. MCA bifurcations normal. Distal MCA branches well opacified and symmetric. POSTERIOR CIRCULATION: Right vertebral artery dominant and patent to the vertebrobasilar junction. Left vertebral artery hypoplastic and not well visualized, likely terminates in PICA. Left posterior inferior cerebral artery is patent. Basilar artery widely patent. Superior cerebellar and posterior cerebral arteries patent bilaterally. No aneurysm or vascular malformation. IMPRESSION: MRI HEAD IMPRESSION: 1. No acute intracranial infarct or other process identified. 2. Remote lacunar infarcts involving the bilateral basal ganglia/corona radiata as well as the left thalamus. 3. 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 4. Generalized cerebral atrophy with moderate chronic small vessel ischemic disease, advanced for patient age. MRA HEAD IMPRESSION: 1. Negative intracranial MRA. No large or proximal arterial branch occlusion. No high-grade or correctable stenosis. 2. 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   On: 07/27/2015 06:59     Beaulah Dinning, MD 07/27/2015, 8:32 AM PGY-1, [place] [facility] FPTS Intern pager: [phone], text pages welcome

## 2015-07-27 NOTE — Progress Notes (Signed)
*  PRELIMINARY RESULTS* Vascular Ultrasound Carotid Duplex (Doppler) has been completed.   There is no obvious evidence of hemodynamically significant internal carotid artery stenosis bilaterally. Vertebral arteries are patent with antegrade flow.  07/27/2015 2:29 PM Gertie FeyMichelle Blake Goya, RVT, RDCS, RDMS

## 2015-07-27 NOTE — Care Management Note (Signed)
Case Management Note  Patient Details  Name: [name] [name] MRN: [id number] Date of Birth: 06/26/1960  Subjective/Objective:   Pt in with Lt leg weakness. Pt found in floor at home by his wife. MRI of head negative. Pt currently on CIWA protocol.                  Action/Plan: Awaiting PT/OT recs. CM following for discharge disposition.   Expected Discharge Date:                  Expected Discharge Plan:     In-House Referral:     Discharge planning Services     Post Acute Care Choice:    Choice offered to:     DME Arranged:    DME Agency:     HH Arranged:    HH Agency:     Status of Service:  In process, will continue to follow  Medicare Important Message Given:    Date Medicare IM Given:    Medicare IM give by:    Date Additional Medicare IM Given:    Additional Medicare Important Message give by:     If discussed at Long Length of Stay Meetings, dates discussed:    Additional Comments:  Kermit BaloKelli F Cathy Ropp, RN 07/27/2015, 10:48 AM

## 2015-07-27 NOTE — ED Notes (Signed)
Attempted report to floor X2.

## 2015-07-28 ENCOUNTER — Inpatient Hospital Stay (HOSPITAL_COMMUNITY): Payer: BLUE CROSS/BLUE SHIELD

## 2015-07-28 ENCOUNTER — Observation Stay (HOSPITAL_COMMUNITY): Payer: BLUE CROSS/BLUE SHIELD

## 2015-07-28 DIAGNOSIS — R29898 Other symptoms and signs involving the musculoskeletal system: Secondary | ICD-10-CM

## 2015-07-28 DIAGNOSIS — M62838 Other muscle spasm: Secondary | ICD-10-CM | POA: Diagnosis not present

## 2015-07-28 DIAGNOSIS — N179 Acute kidney failure, unspecified: Secondary | ICD-10-CM | POA: Diagnosis not present

## 2015-07-28 DIAGNOSIS — R269 Unspecified abnormalities of gait and mobility: Secondary | ICD-10-CM | POA: Diagnosis not present

## 2015-07-28 LAB — BASIC METABOLIC PANEL
ANION GAP: 5 (ref 5–15)
BUN: 8 mg/dL (ref 6–20)
CALCIUM: 8.8 mg/dL — AB (ref 8.9–10.3)
CHLORIDE: 112 mmol/L — AB (ref 101–111)
CO2: 22 mmol/L (ref 22–32)
CREATININE: 1.17 mg/dL (ref 0.61–1.24)
GFR calc non Af Amer: >60 mL/min (ref >60)
Glucose, Bld: 108 mg/dL — ABNORMAL HIGH (ref 65–99)
Potassium: 3.4 mmol/L — ABNORMAL LOW (ref 3.5–5.1)
SODIUM: 139 mmol/L (ref 135–145)

## 2015-07-28 LAB — HEMOGLOBIN A1C
Hgb A1c MFr Bld: 5.3 % (ref 4.8–5.6)
Mean Plasma Glucose: 105 mg/dL

## 2015-07-28 LAB — CBC
HCT: 33.5 % — ABNORMAL LOW (ref 39.0–52.0)
HEMOGLOBIN: 10.5 g/dL — AB (ref 13.0–17.0)
MCH: 31.3 pg (ref 26.0–34.0)
MCHC: 31.3 g/dL (ref 30.0–36.0)
MCV: 100 fL (ref 78.0–100.0)
PLATELETS: 124 10*3/uL — AB (ref 150–400)
RBC: 3.35 MIL/uL — AB (ref 4.22–5.81)
RDW: 13.8 % (ref 11.5–15.5)
WBC: 7 10*3/uL (ref 4.0–10.5)

## 2015-07-28 MED ORDER — POTASSIUM CHLORIDE CRYS ER 20 MEQ PO TBCR
40.0000 meq | EXTENDED_RELEASE_TABLET | Freq: Once | ORAL | Status: AC
  Administered 2015-07-28: 40 meq via ORAL
  Filled 2015-07-28: qty 2

## 2015-07-28 MED ORDER — ATORVASTATIN CALCIUM 40 MG PO TABS
40.0000 mg | ORAL_TABLET | Freq: Every day | ORAL | Status: DC
  Administered 2015-07-29 – 2015-07-30 (×2): 40 mg via ORAL
  Filled 2015-07-28 (×2): qty 1

## 2015-07-28 NOTE — Evaluation (Signed)
Physical Therapy Evaluation Patient Details Name: [name] [name] MRN: [id number] DOB: 12/04/1960 Today's Date: 07/28/2015   History of Present Illness  Patient is a 55 y/o male with hx of HTN, DM, bipolar, Hepatic Encephalopathy, TBI in 1997, and alcohol abuse presents s/p fall at home with left sided weakness and dysarthria. Some fecal incontinence reported. MRI- unremarkable for acute CVA, remote lacunar infarcts, with advanced cerebral atrophy, concern for possible vascular dementia. MRI spine-Moderate canal stenosis at L3-4, L4-5 related to mild disc bulging. Cervical spine MRI- Degenerative disc bulges C3-6i  Clinical Impression  Patient presents with generalized weakness, LLE>RLE, incoordination, impaired balance, cognitive deficits and impaired mobility s/p above. Tolerated gait training with Mod A for balance/safety secondary to bil knee instability. Pt is a high fall risk. Pt independent PTA and lives with wife who works during the day. Would benefit from post acute rehab to maximize independence and mobility prior to return home. Will follow acutely.     Follow Up Recommendations CIR    Equipment Recommendations  Other (comment) (TBA)    Recommendations for Other Services OT consult;Rehab consult     Precautions / Restrictions Precautions Precautions: Fall Restrictions Weight Bearing Restrictions: No      Mobility  Bed Mobility Overal bed mobility: Needs Assistance Bed Mobility: Supine to Sit     Supine to sit: Min guard;HOB elevated     General bed mobility comments: No assist needed, use of rail for support.   Transfers Overall transfer level: Needs assistance Equipment used: Rolling walker (2 wheeled);None Transfers: Sit to/from [employer];Stand Pivot Transfers Sit to Stand: Min assist Stand pivot transfers: Min assist       General transfer comment: Min A to boost from EOB x1, from chair x2 with cues for hand placement/technique. SPT to chair Min A for balance.    Ambulation/Gait Ambulation/Gait assistance: Mod assist Ambulation Distance (Feet): 75 Feet (x2 bouts) Assistive device:  (rail in hallway ) Gait Pattern/deviations: Step-to pattern;Step-through pattern;Decreased stride length;Trunk flexed;Staggering right;Staggering left Gait velocity: decreased Gait velocity interpretation: Below normal speed for age/gender General Gait Details: Bil knee instability noted during gait training LLE>RLE. DOE. 1 seated rest break. Pt leaning to right/left inconsistently.  Stairs            Wheelchair Mobility    Modified Rankin (Stroke Patients Only) Modified Rankin (Stroke Patients Only) Pre-Morbid Rankin Score: Moderate disability Modified Rankin: Moderately severe disability     Balance Overall balance assessment: Needs assistance;History of Falls Sitting-balance support: Feet supported;No upper extremity supported Sitting balance-Leahy Scale: Fair     Standing balance support: During functional activity Standing balance-Leahy Scale: Poor Standing balance comment: Requires UE support for static standing balance.                              Pertinent Vitals/Pain Pain Assessment: No/denies pain    Home Living Family/patient expects to be discharged to:: Private residence Living Arrangements: Spouse/significant other Available Help at Discharge: Family;Available PRN/intermittently (wife works) Type of Home: House Home Access: Level entry     Home Layout: One level Home Equipment: Cane - single point;Crutches      Prior Function Level of Independence: Independent         Comments: Reports 2-3 falls in last few months but not sure of accuracy.      Hand Dominance   Dominant Hand: Right    Extremity/Trunk Assessment   Upper Extremity Assessment: Defer to OT evaluation (  Incoordination BUEs, Rt>left as noted during FTN testing.)           Lower Extremity Assessment: Generalized weakness (Grossly ~4/5  throughout )         Communication   Communication: No difficulties  Cognition Arousal/Alertness: Awake/alert Behavior During Therapy: WFL for tasks assessed/performed Overall Cognitive Status: No family/caregiver present to determine baseline cognitive functioning (Not able to state date. Difficulty reporting symptoms, inconsistencies noted with notes and things told to this therapist.)                      General Comments      Exercises        Assessment/Plan    PT Assessment Patient needs continued PT services  PT Diagnosis Difficulty walking;Generalized weakness   PT Problem List Decreased strength;Decreased coordination;Cardiopulmonary status limiting activity;Decreased cognition;Decreased activity tolerance;Decreased balance;Decreased mobility;Decreased safety awareness;Decreased knowledge of use of DME  PT Treatment Interventions Balance training;Gait training;Functional mobility training;Therapeutic activities;Therapeutic exercise;Patient/family education;Neuromuscular re-education   PT Goals (Current goals can be found in the Care Plan section) Acute Rehab PT Goals Patient Stated Goal: to be able to return to normal PT Goal Formulation: With patient Time For Goal Achievement: 08/11/15 Potential to Achieve Goals: Fair    Frequency Min 4X/week   Barriers to discharge Decreased caregiver support home alone during the day    Co-evaluation               End of Session Equipment Utilized During Treatment: Gait belt Activity Tolerance: Patient tolerated treatment well Patient left: in chair;with call bell/phone within reach;with chair alarm set Nurse Communication: Mobility status    Functional Assessment Tool Used: clinical judgment Functional Limitation: Mobility: Walking and moving around Mobility: Walking and Moving Around Current Status [phone]): At least 40 percent but less than 60 percent impaired, limited or restricted Mobility: Walking and  Moving Around Goal Status [phone]): At least 20 percent but less than 40 percent impaired, limited or restricted    Time: 1011-1038 PT Time Calculation (min) (ACUTE ONLY): 27 min   Charges:   PT Evaluation $PT Eval Moderate Complexity: 1 Procedure PT Treatments $Gait Training: 8-22 mins   PT G Codes:   PT G-Codes **NOT FOR INPATIENT CLASS** Functional Assessment Tool Used: clinical judgment Functional Limitation: Mobility: Walking and moving around Mobility: Walking and Moving Around Current Status ([id number]): At least 40 percent but less than 60 percent impaired, limited or restricted Mobility: Walking and Moving Around Goal Status [phone]): At least 20 percent but less than 40 percent impaired, limited or restricted    Nicola Heinemann A Gilles Trimpe 07/28/2015, 11:53 AM Mylo Red, PT, DPT [phone]

## 2015-07-28 NOTE — Consult Note (Signed)
Physical Medicine and Rehabilitation Consult Reason for Consult: Lumbar canal stenosis/multi-medical Referring Physician: Internal medicine    HPI: ROSCO [name] is a 55 y.o. right handed male with history of hypertension, hepatic encephalopathy, TBI 1997, alcohol tobacco abuse. Per chart review patient lives with spouse independent with occasional cane prior to admission. Wife works during the day. No local family.  Presented 07/26/2015 after a fall at home with left lower extremity weakness/spastic ataxic gait. Patient had reported a history of frequent mechanical falls recently. Denied loss of consciousness. Ammonia level 37. Urine drug screen positive for marijuana. CT/MRI of the brain showed no acute intracranial infarct or other process identified. Remote lacunar infarcts involving the bilateral basal ganglia corona radiata as well as left thalamus. Bifrontal encephalomalacia noted. MRA of the head negative for occlusion or stenosis. MRI cervical spine no cord compression or edema.Marland Kitchen MRI lumbar spine no acute abnormality noted moderate canal stenosis L3-4 and L4-5 related to mild disc bulging. Neurology consulted with workup ongoing. Physical occupational therapy evaluations completed with recommendations of physical medicine rehabilitation consult.  Patient states that he was ambulating without  An assisted device and was able to do  Floor transfers prior to his admission. He states that for the last month or 2 he has not had any alcohol however prior to that was drinking quite heavily.Patient has not been able to work since his traumatic brain injury in 1997. In addition to his walking problems that primarily affects his left leg he notes some problem with his arms but cannot  Specifically name any specific activities that he can no longer do.  Cervical MRI did show severe foraminal stenosis  On the right at C3-4, C5-6 and on the left At C5-6. Review of Systems  Constitutional:  Negative for fever and chills.  HENT: Negative for hearing loss.   Eyes: Negative for blurred vision and double vision.  Cardiovascular: Negative for chest pain, palpitations and leg swelling.  Gastrointestinal: Positive for constipation. Negative for nausea and vomiting.  Genitourinary: Positive for urgency. Negative for dysuria and hematuria.  Musculoskeletal: Positive for myalgias and falls.  Skin: Negative for rash.  Neurological: Positive for weakness and headaches. Negative for seizures.  Psychiatric/Behavioral:       Bipolar disorder  All other systems reviewed and are negative.  Past Medical History  Diagnosis Date  . Hypertension   . Diabetes mellitus   . Hyperkalemia 12/08/2014  . Bipolar disorder (HCC)   . Alcohol abuse   . Gastric ulcer   . Hepatic encephalopathy [facility])    Past Surgical History  Procedure Laterality Date  . Esophagogastroduodenoscopy (egd) with propofol Left 12/09/2014    Procedure: ESOPHAGOGASTRODUODENOSCOPY (EGD) WITH PROPOFOL;  Surgeon: Bernette Redbird, MD;  Location[facility] ENDOSCOPY;  Service: Endoscopy;  Laterality: Left;   No family history on file. Social History:  reports that he has been smoking Cigarettes.  He has a 15 pack-year smoking history. He has never used smokeless tobacco. He reports that he drinks about 8.4 oz of alcohol per week. He reports that he does not use illicit drugs. Allergies:  Allergies  Allergen Reactions  . Ace Inhibitors     REACTION: angioedema  . Penicillins     REACTION: rash   Medications Prior to Admission  Medication Sig Dispense Refill  . allopurinol (ZYLOPRIM) 300 MG tablet Take 1 tablet (300 mg total) by mouth daily. 30 tablet 6  . amLODipine (NORVASC) 5 MG tablet Take 1 tablet (5 mg total)  by mouth daily. 30 tablet 11  . aspirin 81 MG chewable tablet Chew 1 tablet (81 mg total) by mouth daily. 30 tablet 6  . atorvastatin (LIPITOR) 20 MG tablet Take 1 tablet (20 mg total) by mouth daily. 90 tablet 3  .  FLUoxetine (PROZAC) 40 MG capsule Take 40 mg by mouth at bedtime.  9  . folic acid (FOLVITE) 1 MG tablet TAKE 1 TABLET (1 MG TOTAL) BY MOUTH DAILY. 30 tablet 6  . lactulose (CHRONULAC) 10 GM/15ML solution Take 45 mLs (30 g total) by mouth daily. (Patient taking differently: Take 30 g by mouth daily as needed for mild constipation. ) 1892 mL 11  . lithium (ESKALITH) 450 MG CR tablet Take 1 tablet (450 mg total) by mouth daily. For mental health 30 tablet 6  . metoprolol (LOPRESSOR) 100 MG tablet Take 1 tablet (100 mg total) by mouth 2 (two) times daily. 60 tablet 6  . omega-3 acid ethyl esters (LOVAZA) 1 g capsule TAKE 1 CAPSULE (1 G TOTAL) BY MOUTH DAILY. 30 capsule 6  . pantoprazole (PROTONIX) 40 MG tablet Take 1 tablet (40 mg total) by mouth daily. 30 tablet 3  . nicotine (NICODERM CQ - DOSED IN MG/24 HOURS) 14 mg/24hr patch Place 1 patch (14 mg total) onto the skin daily. (Patient not taking: Reported on 07/27/2015) 28 patch 0  . spironolactone (ALDACTONE) 50 MG tablet Take 1 tablet (50 mg total) by mouth daily. (Patient not taking: Reported on 07/27/2015) 30 tablet 5    Home: Home Living Family/patient expects to be discharged to:: Private residence Living Arrangements: Spouse/significant other Available Help at Discharge: Family, Available PRN/intermittently (wife works full-time, daughter lives nearby) Type of Home: House Home Access: Level entry Home Layout: One level Bathroom Shower/Tub: Engineer, manufacturing systems: Standard Home Equipment: Cane - single point, Crutches  Functional History: Prior Function Level of Independence: Independent Comments: Reports 2-3 falls in last few months but not sure of accuracy.  Functional Status:  Mobility: Bed Mobility Overal bed mobility: Needs Assistance Bed Mobility: Supine to Sit Supine to sit: Min guard, HOB elevated General bed mobility comments: in recliner Transfers Overall transfer level: Needs assistance Equipment used:  Rolling walker (2 wheeled) Transfers: Sit to/from Stand Sit to Stand: Min assist Stand pivot transfers: Min assist General transfer comment: Min A during powerup to standing from recliner. Cues for hand placement onto rw.  Ambulation/Gait Ambulation/Gait assistance: Mod assist Ambulation Distance (Feet): 75 Feet (x2 bouts) Assistive device:  (rail in hallway ) Gait Pattern/deviations: Step-to pattern, Step-through pattern, Decreased stride length, Trunk flexed, Staggering right, Staggering left General Gait Details: Bil knee instability noted during gait training LLE>RLE. DOE. 1 seated rest break. Pt leaning to right/left inconsistently. Gait velocity: decreased Gait velocity interpretation: Below normal speed for age/gender    ADL: ADL Overall ADL's : Needs assistance/impaired Eating/Feeding: Set up, Sitting Eating/Feeding Details (indicate cue type and reason): likely extra time needed due to weakness Grooming: Brushing hair, Sitting, Set up Grooming Details (indicate cue type and reason): fatigue reported in RUE, switched between R and LUE Upper Body Bathing: Minimal assitance, Sitting Lower Body Bathing: Sit to/from stand, Moderate assistance Upper Body Dressing : Minimal assistance, Sitting Lower Body Dressing: Moderate assistance, Sit to/from stand Toilet Transfer: Moderate assistance, Stand-pivot, BSC, RW Toileting- Clothing Manipulation and Hygiene: Moderate assistance, Sit to/from stand Tub/ Shower Transfer: Moderate assistance, Stand-pivot, 3 in 1, Rolling walker Tub/Shower Transfer Details (indicate cue type and reason): clinical judgment General ADL Comments: Pt manipulated  lids to various grooming items with no physical assist needed but some increased effort noted. Stood and walked in place with 1 episode of R knee buckling. Did not attempt mobilty due to +1 assist only. Ataxia during [facility] activities?  Cognition: Cognition Overall Cognitive Status: No family/caregiver  present to determine baseline cognitive functioning Orientation Level: Oriented to person, Oriented to place, Oriented to situation, Disoriented to time Cognition Arousal/Alertness: Awake/alert Behavior During Therapy: WFL for tasks assessed/performed Overall Cognitive Status: No family/caregiver present to determine baseline cognitive functioning  Blood pressure 130/82, pulse 60, temperature 98.7 F (37.1 C), temperature source Oral, resp. rate 18, height 6' (1.829 m), weight 109.181 kg (240 lb 11.2 oz), SpO2 97 %. Physical Exam  Constitutional:  55 year old right-handed male no acute distress sitting up in chair.  HENT:  Head: Normocephalic.  Eyes: EOM are normal.  Neck: Normal range of motion. Neck supple. No thyromegaly present.  Cardiovascular: Normal rate and regular rhythm.   Respiratory: Effort normal and breath sounds normal. No respiratory distress.  GI: Soft. Bowel sounds are normal. He exhibits no distension.  Neurological: He is alert.  Mood is flat but appropriate. Follows simple commands. He is able to provide his name age and date of birth.  Skin:  Multiple bruises to the arms  Motor strength is3+ right deltoid and right tricep 4 minus right bicep 4 at the  Right finger flexors 4+ at the left deltoid bicep tricep and grip Sensation intact to light touch in bilateral upper limbs Deep tendon reflexes are 3+ brisk in bilateral biceps triceps brachioradialis as well as left quad and left Achilles Right quad and right Achilles deep tendon reflexes are absent Motor strength is 4/5 bilateral hip flexors and knee extensors 4 minus bilateral ankle dorsiflexor plantar flexor   Results for orders placed or performed during the hospital encounter of 07/26/15 (from the past 24 hour(s))  Basic metabolic panel     Status: Abnormal   Collection Time: 07/28/15  5:18 AM  Result Value Ref Range   Sodium 139 135 - 145 mmol/L   Potassium 3.4 (L) 3.5 - 5.1 mmol/L   Chloride 112 (H) 101  - 111 mmol/L   CO2 22 22 - 32 mmol/L   Glucose, Bld 108 (H) 65 - 99 mg/dL   BUN 8 6 - 20 mg/dL   Creatinine, Ser 1.61 0.61 - 1.24 mg/dL   Calcium 8.8 (L) 8.9 - 10.3 mg/dL   GFR calc non Af Amer >60 >60 mL/min   GFR calc Af Amer >60 >60 mL/min   Anion gap 5 5 - 15  CBC     Status: Abnormal   Collection Time: 07/28/15  5:18 AM  Result Value Ref Range   WBC 7.0 4.0 - 10.5 K/uL   RBC 3.35 (L) 4.22 - 5.81 MIL/uL   Hemoglobin 10.5 (L) 13.0 - 17.0 g/dL   HCT 09.6 (L) 04.5 - 40.9 %   MCV 100.0 78.0 - 100.0 fL   MCH 31.3 26.0 - 34.0 pg   MCHC 31.3 30.0 - 36.0 g/dL   RDW 81.1 91.4 - 78.2 %   Platelets 124 (L) 150 - 400 K/uL   Dg Chest 2 View  07/26/2015  CLINICAL DATA:  Fall. EXAM: CHEST  2 VIEW COMPARISON:  08/14/2010 FINDINGS: Borderline cardiomegaly, likely accentuated by AP technique. The lungs are clear. Pulmonary vasculature is normal. No consolidation, pleural effusion, or pneumothorax. Remote left rib fractures are again seen. No acute osseous abnormalities are seen. IMPRESSION: No  acute abnormality in the chest. Electronically Signed   By: Rubye Oaks M.D.   On: 07/26/2015 23:00   Ct Head Wo Contrast  07/26/2015  CLINICAL DATA:  Found down EXAM: CT HEAD WITHOUT CONTRAST CT CERVICAL SPINE WITHOUT CONTRAST TECHNIQUE: Multidetector CT imaging of the head and cervical spine was performed following the standard protocol without intravenous contrast. Multiplanar CT image reconstructions of the cervical spine were also generated. COMPARISON:  MRI brain dated 08/11/2010 FINDINGS: CT HEAD FINDINGS No evidence of parenchymal hemorrhage or extra-axial fluid collection. No mass lesion, mass effect, or midline shift. No CT evidence of acute infarction. Old lacunar infarct in the left corona radiata. Subcortical white matter and periventricular small vessel ischemic changes. Global cortical atrophy.  No ventriculomegaly. The visualized paranasal sinuses are essentially clear. Minimal partial  opacification of the left mastoid air cells. No evidence of calvarial fracture. CT CERVICAL SPINE FINDINGS Mild straightening of the normal cervical lordosis. No evidence of fracture or dislocation. Vertebral body heights and intervertebral disc spaces are maintained. Dens appears intact. No prevertebral soft tissue swelling. Mild degenerative changes of the mid cervical spine. Visualized thyroid is unremarkable. Visualized lung apices are clear. IMPRESSION: No evidence of acute intracranial abnormality. Old lacunar infarct in the left corona radiata. Atrophy with small vessel ischemic changes. No evidence of traumatic injury to the cervical spine. Mild degenerative changes. Electronically Signed   By: Charline Bills M.D.   On: 07/26/2015 23:22   Ct Cervical Spine Wo Contrast  07/26/2015  CLINICAL DATA:  Found down EXAM: CT HEAD WITHOUT CONTRAST CT CERVICAL SPINE WITHOUT CONTRAST TECHNIQUE: Multidetector CT imaging of the head and cervical spine was performed following the standard protocol without intravenous contrast. Multiplanar CT image reconstructions of the cervical spine were also generated. COMPARISON:  MRI brain dated 08/11/2010 FINDINGS: CT HEAD FINDINGS No evidence of parenchymal hemorrhage or extra-axial fluid collection. No mass lesion, mass effect, or midline shift. No CT evidence of acute infarction. Old lacunar infarct in the left corona radiata. Subcortical white matter and periventricular small vessel ischemic changes. Global cortical atrophy.  No ventriculomegaly. The visualized paranasal sinuses are essentially clear. Minimal partial opacification of the left mastoid air cells. No evidence of calvarial fracture. CT CERVICAL SPINE FINDINGS Mild straightening of the normal cervical lordosis. No evidence of fracture or dislocation. Vertebral body heights and intervertebral disc spaces are maintained. Dens appears intact. No prevertebral soft tissue swelling. Mild degenerative changes of the  mid cervical spine. Visualized thyroid is unremarkable. Visualized lung apices are clear. IMPRESSION: No evidence of acute intracranial abnormality. Old lacunar infarct in the left corona radiata. Atrophy with small vessel ischemic changes. No evidence of traumatic injury to the cervical spine. Mild degenerative changes. Electronically Signed   By: Charline Bills M.D.   On: 07/26/2015 23:22   Mr Brain Wo Contrast  07/27/2015  CLINICAL DATA:  Initial evaluation for acute left lower extremity weakness, fall. EXAM: MRI HEAD WITHOUT CONTRAST MRA HEAD WITHOUT CONTRAST TECHNIQUE: Multiplanar, multiecho pulse sequences of the brain and surrounding structures were obtained without intravenous contrast. Angiographic images of the head were obtained using MRA technique without contrast. COMPARISON:  Prior CT from 07/26/2015. FINDINGS: MRI HEAD FINDINGS Diffuse prominence of the CSF containing spaces is compatible with generalized cerebral atrophy, advanced for patient age. Patchy T2/FLAIR hyperintensity within the periventricular and deep white matter both cerebral hemispheres most consistent with chronic small vessel ischemic disease, moderate nature. Superimposed remote lacunar infarcts within the bilateral basal ganglia/ corona radiata. Chronic  small vessel ischemic changes present within the pons. Remote lacunar infarct within the left thalamus. No normal foci of restricted diffusion to suggest acute intracranial infarct. Gray-white matter differentiation maintained. Major intracranial vascular flow voids preserved. No acute intracranial hemorrhage. Susceptibility artifact within the anterior left frontal lobe compatible with remote hemorrhage, and may be related to remote ischemia and/or trauma. Small focus susceptibility artifact also present within the left parietal lobe. No mass lesion, midline shift, or mass effect. Ventricular prominence related to global parenchymal volume loss without hydrocephalus. No  extra-axial fluid collection. Major dural sinuses are [place] patent. Bifrontal encephalomalacia and likely related to remote trauma. Craniocervical junction within normal limits. Visualized upper cervical spine unremarkable. Pituitary gland normal.  No acute abnormality about the orbits. Minimal layering opacity within the right sphenoid sinus. Paranasal sinuses are otherwise clear. Bilateral mastoid effusions, left greater than right. Inner ear structures grossly normal. Bone marrow signal intensity within normal limits. No scalp soft tissue abnormality. MRA HEAD FINDINGS ANTERIOR CIRCULATION: Visualized distal cervical segments of the internal carotid arteries are patent with antegrade flow. Petrous, cavernous, and supraclinoid segments patent bilaterally without flow-limiting stenosis. A1 segments patent. Right A1 segment hypoplastic. Anterior communicating artery normal. Anterior cerebral arteries well opacified. M1 segments patent without stenosis or occlusion. MCA bifurcations normal. Distal MCA branches well opacified and symmetric. POSTERIOR CIRCULATION: Right vertebral artery dominant and patent to the vertebrobasilar junction. Left vertebral artery hypoplastic and not well visualized, likely terminates in PICA. Left posterior inferior cerebral artery is patent. Basilar artery widely patent. Superior cerebellar and posterior cerebral arteries patent bilaterally. No aneurysm or vascular malformation. IMPRESSION: MRI HEAD IMPRESSION: 1. No acute intracranial infarct or other process identified. 2. Remote lacunar infarcts involving the bilateral basal ganglia/corona radiata as well as the left thalamus. 3. 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 4. Generalized cerebral atrophy with moderate chronic small vessel ischemic disease, advanced for patient age. MRA HEAD IMPRESSION: 1. Negative intracranial MRA. No large or proximal arterial branch occlusion. No high-grade or correctable stenosis. 2.  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   On: 07/27/2015 06:59   Mr Cervical Spine Wo Contrast  07/28/2015  CLINICAL DATA:  Initial evaluation for a gait abnormality, frequent falls at home. EXAM: MRI CERVICAL SPINE WITHOUT CONTRAST TECHNIQUE: Multiplanar, multisequence MR imaging of the cervical spine was performed. No intravenous contrast was administered. COMPARISON:  Prior CT from 07/26/2015. FINDINGS: Alignment: Vertebral bodies are normally aligned with preservation of the normal cervical lordosis. Trace anterolisthesis of C2 on C3. No listhesis or malalignment. Vertebrae: Vertebral body heights are well maintained. No evidence for acute, subacute, or chronic fracture. Signal intensity within the vertebral body bone marrow is normal. No worrisome osseous lesions. No marrow edema. Cord: Signal intensity within the cervical spinal cord is normal. Posterior Fossa, vertebral arteries, paraspinal tissues: Paraspinous soft tissues demonstrate no acute abnormality. No prevertebral edema. Normal intravascular flow voids present within knee vertebral arteries bilaterally. Right vertebral artery dominant. Visualized thyroid grossly unremarkable. Disc levels: C2-C3: Mild bilateral uncovertebral hypertrophy. No significant disc bulge. No stenosis. C3-C4: Mild bilateral uncovertebral hypertrophy with diffuse disc bulge. Disc bulging is eccentric to the right with a broad-based posterior disc protrusion. Protruding disc flattens and partially effaces the ventral thecal sac with mild cord flattening. No cord signal changes. Resultant moderate canal stenosis. Severe right with moderate left foraminal narrowing. C4-C5: Broad posterior disc protrusion indents the ventral thecal sac, abutting the cervical spinal cord  mild cord flattening. No cord signal changes. Resultant moderate canal stenosis. Superimposed bilateral uncovertebral hypertrophy with right  worse than left facet arthrosis. Moderate bilateral foraminal narrowing. C5-C6: Diffuse disc bulge with intervertebral disc space narrowing and bilateral uncovertebral spurring. Bilateral facet arthrosis. Broad-based posterior disc osteophyte flattens the ventral thecal sac with mild cord flattening. No cord signal changes. Superimposed ligamentum flavum thickening at this level. Resultant moderate canal stenosis. Severe bilateral foraminal narrowing. C6-C7: Mild diffuse disc bulge, minimally indenting the ventral thecal sac without significant canal stenosis. Mild bilateral foraminal narrowing related to uncovertebral spurring and disc bulge. C7-T1: No significant disc bulge or disc protrusion. No significant stenosis. Visualized upper thoracic spine within normal limits. No significant degenerative changes. No stenosis. IMPRESSION: 1. No acute abnormality within the cervical spine. Normal appearance of the cervical spinal cord. 2. Degenerative disc bulges with resultant moderate canal stenosis at C3-4, C4-5, and C5-6 as detailed above. 3. Severe right foraminal narrowing at C3-4 and C5-6. More moderate right-sided foraminal narrowing at C4-5. 4. Severe left foraminal narrowing at C5-6, with more moderate left foraminal narrowing at C3-4 and C4-5. Electronically Signed   By: Rise Mu M.D.   On: 07/28/2015 05:20   Mr Lumbar Spine Wo Contrast  07/28/2015  CLINICAL DATA:  Initial evaluation for acute weakness, frequent mechanical falls. Worse in left lower extremity weakness. EXAM: MRI LUMBAR SPINE WITHOUT CONTRAST TECHNIQUE: Multiplanar, multisequence MR imaging of the lumbar spine was performed. No intravenous contrast was administered. COMPARISON:  None. FINDINGS: Segmentation: Transitional lumbosacral anatomy with partial sacralization of the L5 vertebral body. Alignment: Vertebral bodies are normally aligned with preservation of the normal lumbar lordosis. No listhesis or malalignment. Vertebrae:  Vertebral body heights are well maintained. No acute or chronic fracture. Signal intensity within the vertebral body bone marrow is somewhat heterogeneous without worrisome osseous lesion. Conus medullaris: Extends to the L2 level and appears normal. Nerve roots of the cauda equina within normal limits. Paraspinal and other soft tissues: Paraspinous soft tissues demonstrate no acute abnormality. Few scattered T2 hyperintense cyst noted within the partially visualized right kidney. No retroperitoneal adenopathy. Disc levels: Mild diffuse congenital shortening of the pedicles present. L1-2:  Negative. L2-3:  Negative. L3-4: Mild diffuse annular disc bulge without focal disc protrusion. Mild bilateral facet arthrosis. Short pedicles. No significant canal stenosis. Mild bilateral foraminal narrowing. L4-5: Mild diffuse disc bulge with disc desiccation. No focal disc herniation. Moderate bilateral facet arthrosis with ligamentum flavum thickening. Short pedicles. Mild epidural lipomatosis. Resultant moderate canal stenosis. Mild bilateral foraminal narrowing. L5-S1: No significant disc bulge or disc protrusion. No significant facet disease. Mild epidural lipomatosis. No significant canal or foraminal stenosis. IMPRESSION: 1. No acute abnormality within the lumbar spine. 2. Moderate canal stenosis at L3-4 and L4-5 related to mild disc bulging, facet disease, and superimposed short pedicles. Mild bilateral foraminal narrowing at these levels as well. 3. No other significant degenerative changes within the lumbar spine. Electronically Signed   By: Rise Mu M.D.   On: 07/28/2015 06:30   Mr Maxine Glenn Head/brain Wo Cm  07/27/2015  CLINICAL DATA:  Initial evaluation for acute left lower extremity weakness, fall. EXAM: MRI HEAD WITHOUT CONTRAST MRA HEAD WITHOUT CONTRAST TECHNIQUE: Multiplanar, multiecho pulse sequences of the brain and surrounding structures were obtained without intravenous contrast. Angiographic  images of the head were obtained using MRA technique without contrast. COMPARISON:  Prior CT from 07/26/2015. FINDINGS: MRI HEAD FINDINGS Diffuse prominence of the CSF containing spaces is compatible with generalized cerebral atrophy, advanced  for patient age. Patchy T2/FLAIR hyperintensity within the periventricular and deep white matter both cerebral hemispheres most consistent with chronic small vessel ischemic disease, moderate nature. Superimposed remote lacunar infarcts within the bilateral basal ganglia/ corona radiata. Chronic small vessel ischemic changes present within the pons. Remote lacunar infarct within the left thalamus. No normal foci of restricted diffusion to suggest acute intracranial infarct. Gray-white matter differentiation maintained. Major intracranial vascular flow voids preserved. No acute intracranial hemorrhage. Susceptibility artifact within the anterior left frontal lobe compatible with remote hemorrhage, and may be related to remote ischemia and/or trauma. Small focus susceptibility artifact also present within the left parietal lobe. No mass lesion, midline shift, or mass effect. Ventricular prominence related to global parenchymal volume loss without hydrocephalus. No extra-axial fluid collection. Major dural sinuses are [place] patent. Bifrontal encephalomalacia and likely related to remote trauma. Craniocervical junction within normal limits. Visualized upper cervical spine unremarkable. Pituitary gland normal.  No acute abnormality about the orbits. Minimal layering opacity within the right sphenoid sinus. Paranasal sinuses are otherwise clear. Bilateral mastoid effusions, left greater than right. Inner ear structures grossly normal. Bone marrow signal intensity within normal limits. No scalp soft tissue abnormality. MRA HEAD FINDINGS ANTERIOR CIRCULATION: Visualized distal cervical segments of the internal carotid arteries are patent with antegrade flow. Petrous, cavernous, and  supraclinoid segments patent bilaterally without flow-limiting stenosis. A1 segments patent. Right A1 segment hypoplastic. Anterior communicating artery normal. Anterior cerebral arteries well opacified. M1 segments patent without stenosis or occlusion. MCA bifurcations normal. Distal MCA branches well opacified and symmetric. POSTERIOR CIRCULATION: Right vertebral artery dominant and patent to the vertebrobasilar junction. Left vertebral artery hypoplastic and not well visualized, likely terminates in PICA. Left posterior inferior cerebral artery is patent. Basilar artery widely patent. Superior cerebellar and posterior cerebral arteries patent bilaterally. No aneurysm or vascular malformation. IMPRESSION: MRI HEAD IMPRESSION: 1. No acute intracranial infarct or other process identified. 2. Remote lacunar infarcts involving the bilateral basal ganglia/corona radiata as well as the left thalamus. 3. 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 4. Generalized cerebral atrophy with moderate chronic small vessel ischemic disease, advanced for patient age. MRA HEAD IMPRESSION: 1. Negative intracranial MRA. No large or proximal arterial branch occlusion. No high-grade or correctable stenosis. 2. 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   On: 07/27/2015 06:59    Assessment/Plan: Diagnosis: Quadriparesis multifactorial with history of traumatic brain injury, cervical spinal stenosis mainly foraminal but no central canal stenosis.The left lower extremity spasticity is still unexplained. 1. Does the need for close, 24 hr/day medical supervision in concert with the patient's rehab needs make it unreasonable for this patient to be served in a less intensive setting? Yes 2. Co-Morbidities requiring supervision/potential complications:Polysubstance abuse history, history of hepatic encephalopathy,History of bipolar disorder 3. Due to  bladder management, bowel management, safety, skin/wound care, disease management, medication administration, pain management and patient education, does the patient require 24 hr/day rehab nursing? Yes 4. Does the patient require coordinated care of a physician, rehab nurse, PT (1-2 hrs/day, 5 days/week), OT (1-2 hrs/day, 55 days/week) and SLP (0.5-1 hrs/day, 5 days/week) to address physical and functional deficits in the context of the above medical diagnosis(es)? Yes Addressing deficits in the following areas: balance, endurance, locomotion, strength, transferring, bowel/bladder control, bathing, dressing, feeding, grooming, toileting, cognition and psychosocial support 5. Can the patient actively participate in an intensive therapy program of at least 3 hrs of therapy per day at least 5  days per week? Yes 6. The potential for patient to make measurable gains while on inpatient rehab is good 7. Anticipated functional outcomes upon discharge from inpatient rehab are supervision  with PT, supervision with OT, modified independent and supervision with SLP. 8. Estimated rehab length of stay to reach the above functional goals is: 10-14 days 9. Does the patient have adequate social supports and living environment to accommodate these discharge functional goals? Yes 10. Anticipated D/C setting: Home 11. Anticipated post D/C treatments: HH therapy 12. Overall Rehab/Functional Prognosis: good  RECOMMENDATIONS: This patient's condition is appropriate for continued rehabilitative care in the following setting: CIR Patient has agreed to participate in recommended program. Yes Note that insurance prior authorization may be required for reimbursement for recommended care.  Comment: Patient may benefit from thoracic spine MRI to further evaluate left lower limb spasticity. The upper extremity symptoms and weakness could be related to his cervical stenosis.    07/28/2015

## 2015-07-28 NOTE — Progress Notes (Signed)
Interval History:                                                                                                                      [name] [name] is an 55 y.o. male patient  who presented with gait instability, noted to have spastic ataxic gait. MRI of the brain negative for acute pathology. Evidence of chronic traumatic injury noted, but not severe enough to explain the gait abnormalities. MRI Cervical spine shows segmental stenosis but no compression or edema to cause his spacticity.     Past Medical History: Past Medical History  Diagnosis Date  . Hypertension   . Diabetes mellitus   . Hyperkalemia 12/08/2014  . Bipolar disorder (HCC)   . Alcohol abuse   . Gastric ulcer   . Hepatic encephalopathy [facility])     Past Surgical History  Procedure Laterality Date  . Esophagogastroduodenoscopy (egd) with propofol Left 12/09/2014    Procedure: ESOPHAGOGASTRODUODENOSCOPY (EGD) WITH PROPOFOL;  Surgeon: Bernette Redbird, MD;  Location: [facility] ENDOSCOPY;  Service: Endoscopy;  Laterality: Left;    Family History: No family history on file.  Social History:   reports that he has been smoking Cigarettes.  He has a 15 pack-year smoking history. He has never used smokeless tobacco. He reports that he drinks about 8.4 oz of alcohol per week. He reports that he does not use illicit drugs.  Allergies:  Allergies  Allergen Reactions  . Ace Inhibitors     REACTION: angioedema  . Penicillins     REACTION: rash     Medications:                                                                                                                         Current facility-administered medications:  .  0.9 %  sodium chloride infusion, , Intravenous, Continuous, Asiyah Mayra Reel, MD, Last Rate: 125 mL/hr at 07/28/15 0546 .  allopurinol (ZYLOPRIM) tablet 300 mg, 300 mg, Oral, Daily, Asiyah Mayra Reel, MD, 300 mg at 07/28/15 1005 .  aspirin chewable tablet 81 mg, 81 mg, Oral, Daily, Asiyah Mayra Reel,  MD, 81 mg at 07/28/15 1005 .  atorvastatin (LIPITOR) tablet 20 mg, 20 mg, Oral, Daily, Asiyah Mayra Reel, MD, 20 mg at 07/28/15 1005 .  enoxaparin (LOVENOX) injection 40 mg, 40 mg, Subcutaneous, QHS, Asiyah Mayra Reel, MD, 40 mg at 07/27/15 2152 .  FLUoxetine (PROZAC) capsule 40 mg, 40 mg, Oral, QHS, Asiyah Mayra Reel, MD, 40 mg at  07/27/15 2151 .  folic acid (FOLVITE) tablet 1 mg, 1 mg, Oral, Daily, Asiyah Mayra Reel, MD, 1 mg at 07/28/15 1005 .  lactulose (CHRONULAC) 10 GM/15ML solution 30 g, 30 g, Oral, Daily PRN, Asiyah Mayra Reel, MD .  lithium carbonate (ESKALITH) CR tablet 450 mg, 450 mg, Oral, Daily, Asiyah Mayra Reel, MD, 450 mg at 07/28/15 1005 .  multivitamin with minerals tablet 1 tablet, 1 tablet, Oral, Daily, Asiyah Mayra Reel, MD, 1 tablet at 07/28/15 1005 .  nicotine (NICODERM CQ - dosed in mg/24 hours) patch 21 mg, 21 mg, Transdermal, Daily, Beaulah Dinning, MD, 21 mg at 07/28/15 1007 .  pantoprazole (PROTONIX) EC tablet 40 mg, 40 mg, Oral, Daily, Asiyah Mayra Reel, MD, 40 mg at 07/28/15 1005 .  senna-docusate (Senokot-S) tablet 1 tablet, 1 tablet, Oral, QHS PRN, Asiyah Mayra Reel, MD .  thiamine (VITAMIN B-1) tablet 100 mg, 100 mg, Oral, Daily, 100 mg at 07/28/15 1005 **OR** thiamine (B-1) injection 100 mg, 100 mg, Intravenous, Daily, Asiyah Mayra Reel, MD   Neurologic Examination:                                                                                                     Today's Vitals   07/28/15 0135 07/28/15 0531 07/28/15 0800 07/28/15 1042  BP: 144/88 140/82  130/82  Pulse: 65 72  60  Temp: 98.4 F (36.9 C) 99.5 F (37.5 C)  98.7 F (37.1 C)  TempSrc: Oral Oral  Oral  Resp: 20 18  18   Height:      Weight:      SpO2: 97% 95%  97%  PainSc:   0-No pain     Evaluation of higher integrative functions including: Level of alertness: Alert,  Oriented to time, place and person Recent and remote memory - intact  Attention span and  concentration - intact  Speech: fluent, no evidence of dysarthria or aphasia noted.  Test the following cranial nerves: 2-12 grossly intact Motor examination: Normal tone, bulk, full 5/5 motor strength in all 4 extremities with spasticity Examination of sensation : Normal and symmetric sensation to pinprick in all 4 extremities and on face Examination of deep tendon reflexes: 2+, normal and symmetric in all extremities, normal plantars bilaterally Test coordination: Normal finger nose testing, with no evidence of limb appendicular ataxia or abnormal involuntary movements or tremors noted.  Gait: Deferred   Lab Results: Basic Metabolic Panel:  Recent Labs Lab 07/26/15 2204 07/26/15 2211 07/27/15 0209 07/27/15 0528 07/28/15 0518  NA 136 140 136 136 139  K 3.9 4.0 3.4* 3.1* 3.4*  CL 111 110 108 107 112*  CO2 19*  --  21* 22 22  GLUCOSE 133* 133* 120* 103* 108*  BUN 11 14 11 10 8   CREATININE 1.76* 1.60* 1.55* 1.44* 1.17  CALCIUM 9.6  --  9.5 9.1 8.8*    Liver Function Tests:  Recent Labs Lab 07/26/15 2204 07/27/15 0209  AST 71* 65*  ALT 76* 70*  ALKPHOS 56 50  BILITOT 1.1 1.0  PROT 6.8 6.8  ALBUMIN 3.2* 3.1*  No results for input(s): LIPASE, AMYLASE in the last 168 hours.  Recent Labs Lab 07/26/15 2214 07/27/15 0210  AMMONIA 51* 37*    CBC:  Recent Labs Lab 07/26/15 2204 07/26/15 2211 07/27/15 0209 07/27/15 0528 07/28/15 0518  WBC 10.2  --  11.1* 9.8 7.0  NEUTROABS 8.5*  --   --   --   --   HGB 12.5* 14.3 11.9* 11.0* 10.5*  HCT 38.5* 42.0 36.8* 34.3* 33.5*  MCV 98.7  --  99.5 99.4 100.0  PLT 133*  --  139* 122* 124*    Cardiac Enzymes:  Recent Labs Lab 07/27/15 0209  CKTOTAL 561*    Lipid Panel:  Recent Labs Lab 07/27/15 0210  CHOL 187  TRIG 152*  HDL 68  CHOLHDL 2.8  VLDL 30  LDLCALC 89    CBG:  Recent Labs Lab 07/26/15 2306  GLUCAP 130*    Microbiology: Results for orders placed or performed during the hospital  encounter of 08/10/10  Pathologist smear review     Status: None   Collection Time: 08/10/10  2:45 PM  Result Value Ref Range Status   Tech Review NEUTROPHILIA WITH LEFT SHIFT  Final    Comment: LYPHOCYTIC ANEMIA SLIGHT THROMBOCYTOSIS Reviewed by Beulah Gandy. Luisa Hart, M.D. 6.14.12  Urine culture     Status: None   Collection Time: 08/10/10  3:15 PM  Result Value Ref Range Status   Specimen Description URINE, CLEAN CATCH  Final   Special Requests NONE  Final   Culture  Setup Time [id number]  Final   Colony Count NO GROWTH  Final   Culture NO GROWTH  Final   Report Status 08/11/2010 FINAL  Final  Culture, blood (routine x 2)     Status: None   Collection Time: 08/10/10  4:55 PM  Result Value Ref Range Status   Specimen Description BLOOD HAND RIGHT  Final   Special Requests BOTTLES DRAWN AEROBIC ONLY 3CC  Final   Culture  Setup Time [id number]  Final   Culture NO GROWTH 5 DAYS  Final   Report Status 08/17/2010 FINAL  Final  Culture, blood (routine x 2)     Status: None   Collection Time: 08/10/10  5:10 PM  Result Value Ref Range Status   Specimen Description BLOOD HAND RIGHT  Final   Special Requests BOTTLES DRAWN AEROBIC AND ANAEROBIC 10CC  Final   Culture  Setup Time [id number]  Final   Culture NO GROWTH 5 DAYS  Final   Report Status 08/17/2010 FINAL  Final  MRSA PCR Screening     Status: None   Collection Time: 08/10/10  8:15 PM  Result Value Ref Range Status   MRSA by PCR NEGATIVE NEGATIVE Final    Comment:        The GeneXpert MRSA Assay (FDA approved for NASAL specimens only), is one component of a comprehensive MRSA colonization surveillance program. It is not intended to diagnose MRSA infection nor to guide or monitor treatment for MRSA infections.  Culture, respiratory     Status: None   Collection Time: 08/11/10  5:30 AM  Result Value Ref Range Status   Specimen Description ENDOTRACHEAL  Final   Special Requests NONE  Final   Gram Stain   Final    FEW WBC  PRESENT, PREDOMINANTLY PMN RARE SQUAMOUS EPITHELIAL CELLS PRESENT ABUNDANT GRAM POSITIVE COCCI IN PAIRS AND CHAINS RARE GRAM NEGATIVE RODS   Culture Non-Pathogenic Oropharyngeal-type Flora Isolated.  Final   Report Status 08/13/2010 FINAL  Final  Urine culture     Status: None   Collection Time: 08/14/10  9:07 PM  Result Value Ref Range Status   Specimen Description URINE, CLEAN CATCH  Final   Special Requests NONE  Final   Culture  Setup Time [id number]  Final   Colony Count NO GROWTH  Final   Culture NO GROWTH  Final   Report Status 08/15/2010 FINAL  Final    Imaging: Dg Chest 2 View  07/26/2015  CLINICAL DATA:  Fall. EXAM: CHEST  2 VIEW COMPARISON:  08/14/2010 FINDINGS: Borderline cardiomegaly, likely accentuated by AP technique. The lungs are clear. Pulmonary vasculature is normal. No consolidation, pleural effusion, or pneumothorax. Remote left rib fractures are again seen. No acute osseous abnormalities are seen. IMPRESSION: No acute abnormality in the chest. Electronically Signed   By: Rubye Oaks M.D.   On: 07/26/2015 23:00   Ct Head Wo Contrast  07/26/2015  CLINICAL DATA:  Found down EXAM: CT HEAD WITHOUT CONTRAST CT CERVICAL SPINE WITHOUT CONTRAST TECHNIQUE: Multidetector CT imaging of the head and cervical spine was performed following the standard protocol without intravenous contrast. Multiplanar CT image reconstructions of the cervical spine were also generated. COMPARISON:  MRI brain dated 08/11/2010 FINDINGS: CT HEAD FINDINGS No evidence of parenchymal hemorrhage or extra-axial fluid collection. No mass lesion, mass effect, or midline shift. No CT evidence of acute infarction. Old lacunar infarct in the left corona radiata. Subcortical white matter and periventricular small vessel ischemic changes. Global cortical atrophy.  No ventriculomegaly. The visualized paranasal sinuses are essentially clear. Minimal partial opacification of the left mastoid air cells. No evidence  of calvarial fracture. CT CERVICAL SPINE FINDINGS Mild straightening of the normal cervical lordosis. No evidence of fracture or dislocation. Vertebral body heights and intervertebral disc spaces are maintained. Dens appears intact. No prevertebral soft tissue swelling. Mild degenerative changes of the mid cervical spine. Visualized thyroid is unremarkable. Visualized lung apices are clear. IMPRESSION: No evidence of acute intracranial abnormality. Old lacunar infarct in the left corona radiata. Atrophy with small vessel ischemic changes. No evidence of traumatic injury to the cervical spine. Mild degenerative changes. Electronically Signed   By: Charline Bills M.D.   On: 07/26/2015 23:22   Ct Cervical Spine Wo Contrast  07/26/2015  CLINICAL DATA:  Found down EXAM: CT HEAD WITHOUT CONTRAST CT CERVICAL SPINE WITHOUT CONTRAST TECHNIQUE: Multidetector CT imaging of the head and cervical spine was performed following the standard protocol without intravenous contrast. Multiplanar CT image reconstructions of the cervical spine were also generated. COMPARISON:  MRI brain dated 08/11/2010 FINDINGS: CT HEAD FINDINGS No evidence of parenchymal hemorrhage or extra-axial fluid collection. No mass lesion, mass effect, or midline shift. No CT evidence of acute infarction. Old lacunar infarct in the left corona radiata. Subcortical white matter and periventricular small vessel ischemic changes. Global cortical atrophy.  No ventriculomegaly. The visualized paranasal sinuses are essentially clear. Minimal partial opacification of the left mastoid air cells. No evidence of calvarial fracture. CT CERVICAL SPINE FINDINGS Mild straightening of the normal cervical lordosis. No evidence of fracture or dislocation. Vertebral body heights and intervertebral disc spaces are maintained. Dens appears intact. No prevertebral soft tissue swelling. Mild degenerative changes of the mid cervical spine. Visualized thyroid is unremarkable.  Visualized lung apices are clear. IMPRESSION: No evidence of acute intracranial abnormality. Old lacunar infarct in the left corona radiata. Atrophy with small vessel ischemic changes. No evidence of traumatic injury to the cervical spine. Mild degenerative changes. Electronically Signed  By: Charline Bills M.D.   On: 07/26/2015 23:22   Mr Brain Wo Contrast  07/27/2015  CLINICAL DATA:  Initial evaluation for acute left lower extremity weakness, fall. EXAM: MRI HEAD WITHOUT CONTRAST MRA HEAD WITHOUT CONTRAST TECHNIQUE: Multiplanar, multiecho pulse sequences of the brain and surrounding structures were obtained without intravenous contrast. Angiographic images of the head were obtained using MRA technique without contrast. COMPARISON:  Prior CT from 07/26/2015. FINDINGS: MRI HEAD FINDINGS Diffuse prominence of the CSF containing spaces is compatible with generalized cerebral atrophy, advanced for patient age. Patchy T2/FLAIR hyperintensity within the periventricular and deep white matter both cerebral hemispheres most consistent with chronic small vessel ischemic disease, moderate nature. Superimposed remote lacunar infarcts within the bilateral basal ganglia/ corona radiata. Chronic small vessel ischemic changes present within the pons. Remote lacunar infarct within the left thalamus. No normal foci of restricted diffusion to suggest acute intracranial infarct. Gray-white matter differentiation maintained. Major intracranial vascular flow voids preserved. No acute intracranial hemorrhage. Susceptibility artifact within the anterior left frontal lobe compatible with remote hemorrhage, and may be related to remote ischemia and/or trauma. Small focus susceptibility artifact also present within the left parietal lobe. No mass lesion, midline shift, or mass effect. Ventricular prominence related to global parenchymal volume loss without hydrocephalus. No extra-axial fluid collection. Major dural sinuses are [place]  patent. Bifrontal encephalomalacia and likely related to remote trauma. Craniocervical junction within normal limits. Visualized upper cervical spine unremarkable. Pituitary gland normal.  No acute abnormality about the orbits. Minimal layering opacity within the right sphenoid sinus. Paranasal sinuses are otherwise clear. Bilateral mastoid effusions, left greater than right. Inner ear structures grossly normal. Bone marrow signal intensity within normal limits. No scalp soft tissue abnormality. MRA HEAD FINDINGS ANTERIOR CIRCULATION: Visualized distal cervical segments of the internal carotid arteries are patent with antegrade flow. Petrous, cavernous, and supraclinoid segments patent bilaterally without flow-limiting stenosis. A1 segments patent. Right A1 segment hypoplastic. Anterior communicating artery normal. Anterior cerebral arteries well opacified. M1 segments patent without stenosis or occlusion. MCA bifurcations normal. Distal MCA branches well opacified and symmetric. POSTERIOR CIRCULATION: Right vertebral artery dominant and patent to the vertebrobasilar junction. Left vertebral artery hypoplastic and not well visualized, likely terminates in PICA. Left posterior inferior cerebral artery is patent. Basilar artery widely patent. Superior cerebellar and posterior cerebral arteries patent bilaterally. No aneurysm or vascular malformation. IMPRESSION: MRI HEAD IMPRESSION: 1. No acute intracranial infarct or other process identified. 2. Remote lacunar infarcts involving the bilateral basal ganglia/corona radiata as well as the left thalamus. 3. 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 4. Generalized cerebral atrophy with moderate chronic small vessel ischemic disease, advanced for patient age. MRA HEAD IMPRESSION: 1. Negative intracranial MRA. No large or proximal arterial branch occlusion. No high-grade or correctable stenosis. 2. Hypoplastic left vertebral artery, terminating in PICA  Electronically Signed   By: Rise Mu M.D.   On: 07/27/2015 06:59   Mr Cervical Spine Wo Contrast  07/28/2015  CLINICAL DATA:  Initial evaluation for a gait abnormality, frequent falls at home. EXAM: MRI CERVICAL SPINE WITHOUT CONTRAST TECHNIQUE: Multiplanar, multisequence MR imaging of the cervical spine was performed. No intravenous contrast was administered. COMPARISON:  Prior CT from 07/26/2015. FINDINGS: Alignment: Vertebral bodies are normally aligned with preservation of the normal cervical lordosis. Trace anterolisthesis of C2 on C3. No listhesis or malalignment. Vertebrae: Vertebral body heights are well maintained. No evidence for acute, subacute, or chronic fracture. Signal intensity within the vertebral body bone marrow is normal.  No worrisome osseous lesions. No marrow edema. Cord: Signal intensity within the cervical spinal cord is normal. Posterior Fossa, vertebral arteries, paraspinal tissues: Paraspinous soft tissues demonstrate no acute abnormality. No prevertebral edema. Normal intravascular flow voids present within knee vertebral arteries bilaterally. Right vertebral artery dominant. Visualized thyroid grossly unremarkable. Disc levels: C2-C3: Mild bilateral uncovertebral hypertrophy. No significant disc bulge. No stenosis. C3-C4: Mild bilateral uncovertebral hypertrophy with diffuse disc bulge. Disc bulging is eccentric to the right with a broad-based posterior disc protrusion. Protruding disc flattens and partially effaces the ventral thecal sac with mild cord flattening. No cord signal changes. Resultant moderate canal stenosis. Severe right with moderate left foraminal narrowing. C4-C5: Broad posterior disc protrusion indents the ventral thecal sac, abutting the cervical spinal cord mild cord flattening. No cord signal changes. Resultant moderate canal stenosis. Superimposed bilateral uncovertebral hypertrophy with right worse than left facet arthrosis. Moderate bilateral  foraminal narrowing. C5-C6: Diffuse disc bulge with intervertebral disc space narrowing and bilateral uncovertebral spurring. Bilateral facet arthrosis. Broad-based posterior disc osteophyte flattens the ventral thecal sac with mild cord flattening. No cord signal changes. Superimposed ligamentum flavum thickening at this level. Resultant moderate canal stenosis. Severe bilateral foraminal narrowing. C6-C7: Mild diffuse disc bulge, minimally indenting the ventral thecal sac without significant canal stenosis. Mild bilateral foraminal narrowing related to uncovertebral spurring and disc bulge. C7-T1: No significant disc bulge or disc protrusion. No significant stenosis. Visualized upper thoracic spine within normal limits. No significant degenerative changes. No stenosis. IMPRESSION: 1. No acute abnormality within the cervical spine. Normal appearance of the cervical spinal cord. 2. Degenerative disc bulges with resultant moderate canal stenosis at C3-4, C4-5, and C5-6 as detailed above. 3. Severe right foraminal narrowing at C3-4 and C5-6. More moderate right-sided foraminal narrowing at C4-5. 4. Severe left foraminal narrowing at C5-6, with more moderate left foraminal narrowing at C3-4 and C4-5. Electronically Signed   By: Rise Mu M.D.   On: 07/28/2015 05:20   Mr Lumbar Spine Wo Contrast  07/28/2015  CLINICAL DATA:  Initial evaluation for acute weakness, frequent mechanical falls. Worse in left lower extremity weakness. EXAM: MRI LUMBAR SPINE WITHOUT CONTRAST TECHNIQUE: Multiplanar, multisequence MR imaging of the lumbar spine was performed. No intravenous contrast was administered. COMPARISON:  None. FINDINGS: Segmentation: Transitional lumbosacral anatomy with partial sacralization of the L5 vertebral body. Alignment: Vertebral bodies are normally aligned with preservation of the normal lumbar lordosis. No listhesis or malalignment. Vertebrae: Vertebral body heights are well maintained. No acute  or chronic fracture. Signal intensity within the vertebral body bone marrow is somewhat heterogeneous without worrisome osseous lesion. Conus medullaris: Extends to the L2 level and appears normal. Nerve roots of the cauda equina within normal limits. Paraspinal and other soft tissues: Paraspinous soft tissues demonstrate no acute abnormality. Few scattered T2 hyperintense cyst noted within the partially visualized right kidney. No retroperitoneal adenopathy. Disc levels: Mild diffuse congenital shortening of the pedicles present. L1-2:  Negative. L2-3:  Negative. L3-4: Mild diffuse annular disc bulge without focal disc protrusion. Mild bilateral facet arthrosis. Short pedicles. No significant canal stenosis. Mild bilateral foraminal narrowing. L4-5: Mild diffuse disc bulge with disc desiccation. No focal disc herniation. Moderate bilateral facet arthrosis with ligamentum flavum thickening. Short pedicles. Mild epidural lipomatosis. Resultant moderate canal stenosis. Mild bilateral foraminal narrowing. L5-S1: No significant disc bulge or disc protrusion. No significant facet disease. Mild epidural lipomatosis. No significant canal or foraminal stenosis. IMPRESSION: 1. No acute abnormality within the lumbar spine. 2. Moderate canal stenosis at L3-4  and L4-5 related to mild disc bulging, facet disease, and superimposed short pedicles. Mild bilateral foraminal narrowing at these levels as well. 3. No other significant degenerative changes within the lumbar spine. Electronically Signed   By: Rise Mu M.D.   On: 07/28/2015 06:30   Mr Maxine Glenn Head/brain Wo Cm  07/27/2015  CLINICAL DATA:  Initial evaluation for acute left lower extremity weakness, fall. EXAM: MRI HEAD WITHOUT CONTRAST MRA HEAD WITHOUT CONTRAST TECHNIQUE: Multiplanar, multiecho pulse sequences of the brain and surrounding structures were obtained without intravenous contrast. Angiographic images of the head were obtained using MRA technique  without contrast. COMPARISON:  Prior CT from 07/26/2015. FINDINGS: MRI HEAD FINDINGS Diffuse prominence of the CSF containing spaces is compatible with generalized cerebral atrophy, advanced for patient age. Patchy T2/FLAIR hyperintensity within the periventricular and deep white matter both cerebral hemispheres most consistent with chronic small vessel ischemic disease, moderate nature. Superimposed remote lacunar infarcts within the bilateral basal ganglia/ corona radiata. Chronic small vessel ischemic changes present within the pons. Remote lacunar infarct within the left thalamus. No normal foci of restricted diffusion to suggest acute intracranial infarct. Gray-white matter differentiation maintained. Major intracranial vascular flow voids preserved. No acute intracranial hemorrhage. Susceptibility artifact within the anterior left frontal lobe compatible with remote hemorrhage, and may be related to remote ischemia and/or trauma. Small focus susceptibility artifact also present within the left parietal lobe. No mass lesion, midline shift, or mass effect. Ventricular prominence related to global parenchymal volume loss without hydrocephalus. No extra-axial fluid collection. Major dural sinuses are [place] patent. Bifrontal encephalomalacia and likely related to remote trauma. Craniocervical junction within normal limits. Visualized upper cervical spine unremarkable. Pituitary gland normal.  No acute abnormality about the orbits. Minimal layering opacity within the right sphenoid sinus. Paranasal sinuses are otherwise clear. Bilateral mastoid effusions, left greater than right. Inner ear structures grossly normal. Bone marrow signal intensity within normal limits. No scalp soft tissue abnormality. MRA HEAD FINDINGS ANTERIOR CIRCULATION: Visualized distal cervical segments of the internal carotid arteries are patent with antegrade flow. Petrous, cavernous, and supraclinoid segments patent bilaterally without  flow-limiting stenosis. A1 segments patent. Right A1 segment hypoplastic. Anterior communicating artery normal. Anterior cerebral arteries well opacified. M1 segments patent without stenosis or occlusion. MCA bifurcations normal. Distal MCA branches well opacified and symmetric. POSTERIOR CIRCULATION: Right vertebral artery dominant and patent to the vertebrobasilar junction. Left vertebral artery hypoplastic and not well visualized, likely terminates in PICA. Left posterior inferior cerebral artery is patent. Basilar artery widely patent. Superior cerebellar and posterior cerebral arteries patent bilaterally. No aneurysm or vascular malformation. IMPRESSION: MRI HEAD IMPRESSION: 1. No acute intracranial infarct or other process identified. 2. Remote lacunar infarcts involving the bilateral basal ganglia/corona radiata as well as the left thalamus. 3. 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 4. Generalized cerebral atrophy with moderate chronic small vessel ischemic disease, advanced for patient age. MRA HEAD IMPRESSION: 1. Negative intracranial MRA. No large or proximal arterial branch occlusion. No high-grade or correctable stenosis. 2. 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   On: 07/27/2015 06:59    Assessment and plan:   HIRAM MCIVER is an 55 y.o. 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 MRI of the brain negative for acute pathology. Evidence of chronic traumatic injury noted, but not severe enough to explain the gait abnormalities. MRI cervical spine shows no pathology for his spasticity.  MRI brain also shows  cerebellar atrophy likely related to his prolonged ETOH use.   Recommend: 1) Baclofen 10 mg Q AM and 10 mg Q HS.

## 2015-07-28 NOTE — Progress Notes (Signed)
Rehab Admissions Coordinator Note:  Patient was screened by Clois DupesBoyette, Royanne Warshaw Godwin for appropriateness for an Inpatient Acute Rehab Consult per PT and OT recommendations.   At this time, we are recommending Inpatient Rehab consult.  Clois DupesBoyette, Vernie Vinciguerra Godwin 07/28/2015, 2:02 PM  I can be reached at [phone].

## 2015-07-28 NOTE — Progress Notes (Signed)
Family Medicine Teaching Service Daily Progress Note Intern Pager: [phone]  Patient name: [name] [name] Medical record number: [id number] Date of birth: 09/01/1960 Age: 55 y.o. Gender: male  Primary Care Provider: Mickie HillierIan McKeag, MD Consultants: Neurology Code Status: Full  Pt Overview and Major Events to Date:  5/30: Admit to FPTS  Assessment and Plan: [name] Forestnthony L Bayon is a 55 y.o. male presenting following a fall with acute onset of worsening weakness in left lower extremity. PMH is significant for HTN, hepatomegaly, hepatic encephalopathy, Biopolar disorder, Anemia, alcoholism, tobacco dependence   Left leg weakness causing fall:  Risk factors for stroke include 20 pack years of smoking, HTN. Patient with previous stroke in 1999 per family. Acute stroke has been ruled out, including no acute changes on MRI brain and Carotid US but ECHO pending.  Due to hx of urinary/bowel incontinence over the last 2 months, spinal imaging obtained. Cervical CT with no spinal injury. Lumbar CT with no acute changes but with moderate canal stenosis at L3-4 and L4-5 related to mild disc bulging, facet disease.  - Lipid Panel: total cholesterol 187, TAG 152, HDL 68, LDL 89; A1c 5.3 -> will increase to Lipitor 40mg  (high dose with history of CVA and also ASCVD 8246yr risk ~20%) - Neuro check q4hrs  - ECHO pending - Neurology consulted, appreciate their assistance - telemetry  - Vitals per floor  - PT/OT/ST consulted : PT recommended CIR  Urinary / Bowel Incontinence, with lower ext weakness See above A&P.   Hx of Recent Falls: Patient has hx of hepatic encephalopathy, sometimes compliant with lactulose per review of family medicine notes. On admission, no asterixis noted, however ammonia elevated to 55 this admission. Patient also with hx of alcohol abuse, UDS positive for THC, Ethanol less than > 5. Hx of bipolar disease, on Lithium. Lithium toxicity can cause neurologic symptoms as well, therefore with  check level for possibly toxicity  - Lithium level--> 0.78 (normal) - Will continue lactulose   Cognitive Impairment: Minicog completed 5/31 with score of 1. Additionally noted to have left hemineglect. MRI brain noted generalized cerebral atrophy with moderate chronic small vessel ischemic disease that is advanced for patient age.  - will touch base with family regarding baseline - PT/OT/ST consulted.  AKI (resolved), on CKDII-III: Cr 1.60 (baseline 1.2) on admission, now 1.17    Hx of GI bleed. Patient currently endorse blood in stool and dark stools. Hx of Upper GI bleeding 11/2014, positive for duodneal ulcers at that time. Hgb 12.5>14.3>11.0 > 10.5. Patient denies palpations, dizziness etc.  - FOBT while here--> negative  - Would consider outpatient work up with a colonoscopy   HTN, Home meds: metoprolol, spirolactone and Norvasc. Normotensive now - Holding spirolactone, metoprolol, Norvasc   Hx of alcohol use  - Denies recent alcohol use  - CIWA protocol : scores 0  - Thiamine and Folate   Hepatic Encephalopathy: Ammonia 55, alert x self and place. Per family baseline does not know the month . BM x 1 over 24 hrs  - lactulose PRN, titrate for bowel movement   Biopolar Disorder  - Continue Prozac and Lithium   Hypokalemia: K 3.4 this AM (from 3.1) - 40 meq KDUR this AM - Trend BMP  FEN/GI: Heart Healthy; SLIV PPx: Lovenox  Disposition: Continue to monitor, neurology recs  Subjective:  Patient has no complaints this morning. Reports he is scared to walk due to fear of falling but is able to walk.   Objective: Temp:  [  98.1 F (36.7 C)-99 F (37.2 C)] 98.4 F (36.9 C) (06/01 0135) Pulse Rate:  [65-79] 65 (06/01 0135) Resp:  [16-20] 20 (06/01 0135) BP: (115-144)/(69-88) 144/88 mmHg (06/01 0135) SpO2:  [95 %-98 %] 97 % (06/01 0135) Weight:  [109.181 kg (240 lb 11.2 oz)] 109.181 kg (240 lb 11.2 oz) (05/31 0951) Physical Exam: General: Patient sitting up  in bed, NAD  Cardiovascular: RRR, no murmurs  Respiratory: Anterior only, CTAB, no wheezes  Abdomen: protuberant, BS+, no ttp, no distention Skin: Extensive Bruising on left upper extremity  Neuro:  Mental Status: alert; oriented to person, place but not year (baseline) Cranial Nerves: extraocular movements are full and conjugate; pupils are round slightly asymmetric slightly sluggish; midline uvula, mildly left ward deviation of tongue which patient can correct,  mildly dysarthric speech Motor: 4/5 strength RUE, 5/5 strength LUE, 5/5 strength lower extremities Sensory: grossly intact throughout Coordination: Heel to shin performed wnl, finger to nose not normal  Gait and Station: Not observed  Reflexes: LLE hyperreflexia, RLE normal reflexes, No clonus  Laboratory:  Recent Labs Lab 07/27/15 0209 07/27/15 0528 07/28/15 0518  WBC 11.1* 9.8 7.0  HGB 11.9* 11.0* 10.5*  HCT 36.8* 34.3* 33.5*  PLT 139* 122* 124*    Recent Labs Lab 07/26/15 2204  07/27/15 0209 07/27/15 0528 07/28/15 0518  NA 136  < > 136 136 139  K 3.9  < > 3.4* 3.1* 3.4*  CL 111  < > 108 107 112*  CO2 19*  --  21* 22 22  BUN 11  < > 11 10 8   CREATININE 1.76*  < > 1.55* 1.44* 1.17  CALCIUM 9.6  --  9.5 9.1 8.8*  PROT 6.8  --  6.8  --   --   BILITOT 1.1  --  1.0  --   --   ALKPHOS 56  --  50  --   --   ALT 76*  --  70*  --   --   AST 71*  --  65*  --   --   GLUCOSE 133*  < > 120* 103* 108*  < > = values in this interval not displayed.  Imaging/Diagnostic Tests: Mr Cervical Spine Wo Contrast  07/28/2015  CLINICAL DATA:  Initial evaluation for a gait abnormality, frequent falls at home. EXAM: MRI CERVICAL SPINE WITHOUT CONTRAST TECHNIQUE: Multiplanar, multisequence MR imaging of the cervical spine was performed. No intravenous contrast was administered. COMPARISON:  Prior CT from 07/26/2015. FINDINGS: Alignment: Vertebral bodies are normally aligned with preservation of the normal cervical lordosis.  Trace anterolisthesis of C2 on C3. No listhesis or malalignment. Vertebrae: Vertebral body heights are well maintained. No evidence for acute, subacute, or chronic fracture. Signal intensity within the vertebral body bone marrow is normal. No worrisome osseous lesions. No marrow edema. Cord: Signal intensity within the cervical spinal cord is normal. Posterior Fossa, vertebral arteries, paraspinal tissues: Paraspinous soft tissues demonstrate no acute abnormality. No prevertebral edema. Normal intravascular flow voids present within knee vertebral arteries bilaterally. Right vertebral artery dominant. Visualized thyroid grossly unremarkable. Disc levels: C2-C3: Mild bilateral uncovertebral hypertrophy. No significant disc bulge. No stenosis. C3-C4: Mild bilateral uncovertebral hypertrophy with diffuse disc bulge. Disc bulging is eccentric to the right with a broad-based posterior disc protrusion. Protruding disc flattens and partially effaces the ventral thecal sac with mild cord flattening. No cord signal changes. Resultant moderate canal stenosis. Severe right with moderate left foraminal narrowing. C4-C5: Broad posterior disc protrusion indents the ventral thecal  sac, abutting the cervical spinal cord mild cord flattening. No cord signal changes. Resultant moderate canal stenosis. Superimposed bilateral uncovertebral hypertrophy with right worse than left facet arthrosis. Moderate bilateral foraminal narrowing. C5-C6: Diffuse disc bulge with intervertebral disc space narrowing and bilateral uncovertebral spurring. Bilateral facet arthrosis. Broad-based posterior disc osteophyte flattens the ventral thecal sac with mild cord flattening. No cord signal changes. Superimposed ligamentum flavum thickening at this level. Resultant moderate canal stenosis. Severe bilateral foraminal narrowing. C6-C7: Mild diffuse disc bulge, minimally indenting the ventral thecal sac without significant canal stenosis. Mild bilateral  foraminal narrowing related to uncovertebral spurring and disc bulge. C7-T1: No significant disc bulge or disc protrusion. No significant stenosis. Visualized upper thoracic spine within normal limits. No significant degenerative changes. No stenosis. IMPRESSION: 1. No acute abnormality within the cervical spine. Normal appearance of the cervical spinal cord. 2. Degenerative disc bulges with resultant moderate canal stenosis at C3-4, C4-5, and C5-6 as detailed above. 3. Severe right foraminal narrowing at C3-4 and C5-6. More moderate right-sided foraminal narrowing at C4-5. 4. Severe left foraminal narrowing at C5-6, with more moderate left foraminal narrowing at C3-4 and C4-5. Electronically Signed   By: Rise Mu M.D.   On: 07/28/2015 05:20   Mr Lumbar Spine Wo Contrast  07/28/2015  CLINICAL DATA:  Initial evaluation for acute weakness, frequent mechanical falls. Worse in left lower extremity weakness. EXAM: MRI LUMBAR SPINE WITHOUT CONTRAST TECHNIQUE: Multiplanar, multisequence MR imaging of the lumbar spine was performed. No intravenous contrast was administered. COMPARISON:  None. FINDINGS: Segmentation: Transitional lumbosacral anatomy with partial sacralization of the L5 vertebral body. Alignment: Vertebral bodies are normally aligned with preservation of the normal lumbar lordosis. No listhesis or malalignment. Vertebrae: Vertebral body heights are well maintained. No acute or chronic fracture. Signal intensity within the vertebral body bone marrow is somewhat heterogeneous without worrisome osseous lesion. Conus medullaris: Extends to the L2 level and appears normal. Nerve roots of the cauda equina within normal limits. Paraspinal and other soft tissues: Paraspinous soft tissues demonstrate no acute abnormality. Few scattered T2 hyperintense cyst noted within the partially visualized right kidney. No retroperitoneal adenopathy. Disc levels: Mild diffuse congenital shortening of the pedicles  present. L1-2:  Negative. L2-3:  Negative. L3-4: Mild diffuse annular disc bulge without focal disc protrusion. Mild bilateral facet arthrosis. Short pedicles. No significant canal stenosis. Mild bilateral foraminal narrowing. L4-5: Mild diffuse disc bulge with disc desiccation. No focal disc herniation. Moderate bilateral facet arthrosis with ligamentum flavum thickening. Short pedicles. Mild epidural lipomatosis. Resultant moderate canal stenosis. Mild bilateral foraminal narrowing. L5-S1: No significant disc bulge or disc protrusion. No significant facet disease. Mild epidural lipomatosis. No significant canal or foraminal stenosis. IMPRESSION: 1. No acute abnormality within the lumbar spine. 2. Moderate canal stenosis at L3-4 and L4-5 related to mild disc bulging, facet disease, and superimposed short pedicles. Mild bilateral foraminal narrowing at these levels as well. 3. No other significant degenerative changes within the lumbar spine. Electronically Signed   By: Rise Mu M.D.   On: 07/28/2015 06:30     Palma Holter, MD 07/28/2015, 7:24 AM PGY-1, [facility] FPTS Intern pager: [phone], text pages welcome

## 2015-07-28 NOTE — Progress Notes (Signed)
Occupational Therapy Evaluation Patient Details Name: [name] [name] MRN: [id number] DOB: [birth year] Today's Date: 07/28/2015    History of Present Illness Patient is a 55 y/o male with hx of HTN, DM, bipolar, Hepatic Encephalopathy, TBI in 1997, and alcohol abuse presents s/p fall at home with left sided weakness and dysarthria. Some fecal incontinence reported. MRI- unremarkable for acute CVA, remote lacunar infarcts, with advanced cerebral atrophy, concern for possible vascular dementia. MRI spine-Moderate canal stenosis at L3-4, L4-5 related to mild disc bulging. Cervical spine MRI- Degenerative disc bulges C3-6i   Clinical Impression   Pt admitted with the above diagnoses and presents with below problem list. Pt will benefit from continued OT to address the below listed deficits and maximize independence with BADLs prior to d/c to venue below. PTA pt was independent with ADLs. Pt is currently min A for most ADLs, min A +2 for safety for SPT and functional mobility. Session details below.      Follow Up Recommendations  CIR    Equipment Recommendations  Other (comment) (defer to next venue)    Recommendations for Other Services Rehab consult;Speech consult     Precautions / Restrictions Precautions Precautions: Fall Precaution Comments: h/o falls, knees buckle Restrictions Weight Bearing Restrictions: No      Mobility Bed Mobility Overal bed mobility: Needs Assistance Bed Mobility: Supine to Sit     Supine to sit: Min guard;HOB elevated     General bed mobility comments: in recliner  Transfers Overall transfer level: Needs assistance Equipment used: Rolling walker (2 wheeled) Transfers: Sit to/from Stand Sit to Stand: Min assist Stand pivot transfers: Min assist       General transfer comment: Min A during powerup to standing from recliner. Cues for hand placement onto rw.     Balance Overall balance assessment: Needs assistance;History of  Falls Sitting-balance support: No upper extremity supported;Feet supported Sitting balance-Leahy Scale: Fair     Standing balance support: Bilateral upper extremity supported Standing balance-Leahy Scale: Poor Standing balance comment: external support needed                            ADL Overall ADL's : Needs assistance/impaired Eating/Feeding: Set up;Sitting Eating/Feeding Details (indicate cue type and reason): likely extra time needed due to weakness Grooming: Brushing hair;Sitting;Set up Grooming Details (indicate cue type and reason): fatigue reported in RUE, switched between R and LUE Upper Body Bathing: Minimal assitance;Sitting   Lower Body Bathing: Sit to/from stand;Moderate assistance   Upper Body Dressing : Minimal assistance;Sitting   Lower Body Dressing: Moderate assistance;Sit to/from stand   Toilet Transfer: Moderate assistance;Stand-pivot;BSC;RW   Toileting- Clothing Manipulation and Hygiene: Moderate assistance;Sit to/from stand   Tub/ Engineer, structural: Moderate assistance;Stand-pivot;3 in 1;Rolling walker Tub/Shower Transfer Details (indicate cue type and reason): clinical judgment   General ADL Comments: Pt manipulated lids to various grooming items with no physical assist needed but some increased effort noted. Stood and walked in place with 1 episode of L knee buckling. Did not attempt mobilty due to +1 assist only. Ataxia during [facility] activities?     Vision     Perception     Praxis      Pertinent Vitals/Pain Pain Assessment: No/denies pain     Hand Dominance Right   Extremity/Trunk Assessment Upper Extremity Assessment Upper Extremity Assessment:  (impaired fine motor; weakness R>L noted, pt reports RUE weak)   Lower Extremity Assessment Lower Extremity Assessment: Defer to PT evaluation  Communication Communication Communication: Other (comment) (labored/slurred speech noted. Pt reports speech is different from baseline.  )   Cognition Arousal/Alertness: Awake/alert Behavior During Therapy: WFL for tasks assessed/performed Overall Cognitive Status: No family/caregiver present to determine baseline cognitive functioning                     General Comments    Pt verbalized, "I know they don't want me up by myself. Don't worry I won't get up without help.    Exercises       Shoulder Instructions      Home Living Family/patient expects to be discharged to:: Private residence Living Arrangements: Spouse/significant other Available Help at Discharge: Family;Available PRN/intermittently (wife works full-time, daughter lives nearby) Type of Home: House Home Access: Level entry     Home Layout: One level     Bathroom Shower/Tub: Chief Strategy OfficerTub/shower unit   Bathroom Toilet: Standard     Home Equipment: Cane - single point;Crutches          Prior Functioning/Environment Level of Independence: Independent        Comments: Reports 2-3 falls in last few months but not sure of accuracy.     OT Diagnosis: Generalized weakness;Paresis;Cognitive deficits   OT Problem List: Decreased strength;Decreased activity tolerance;Impaired balance (sitting and/or standing);Decreased coordination;Decreased cognition;Decreased knowledge of use of DME or AE;Decreased knowledge of precautions;Impaired UE functional use   OT Treatment/Interventions: Self-care/ADL training;Therapeutic exercise;DME and/or AE instruction;Neuromuscular education;Therapeutic activities;Cognitive remediation/compensation;Patient/family education;Balance training    OT Goals(Current goals can be found in the care plan section) Acute Rehab OT Goals Patient Stated Goal: to be able to return to normal OT Goal Formulation: With patient Time For Goal Achievement: 08/11/15 Potential to Achieve Goals: Good ADL Goals Pt Will Perform Upper Body Bathing: with modified independence;sitting Pt Will Perform Lower Body Bathing: with modified  independence;sit to/from stand Pt Will Perform Upper Body Dressing: with modified independence;sitting Pt Will Perform Lower Body Dressing: with modified independence;sit to/from stand Pt Will Transfer to Toilet: with modified independence;ambulating (3n1 over toilet) Pt Will Perform Toileting - Clothing Manipulation and hygiene: with modified independence;sit to/from stand;sitting/lateral leans Pt Will Perform Tub/Shower Transfer: with min guard assist;ambulating;3 in 1;rolling walker Pt/caregiver will Perform Home Exercise Program: Increased strength;Right Upper extremity;With written HEP provided;With theraputty;With theraband  OT Frequency: Min 3X/week   Barriers to D/C:            Co-evaluation              End of Session Equipment Utilized During Treatment: Gait belt;Rolling walker  Activity Tolerance: Patient tolerated treatment well Patient left: in chair;with call bell/phone within reach;with chair alarm set   Time: 8119-14781311-1332 OT Time Calculation (min): 21 min Charges:  OT General Charges $OT Visit: 1 Procedure OT Evaluation $OT Eval Low Complexity: 1 Procedure G-Codes: OT G-codes **NOT FOR INPATIENT CLASS** Functional Assessment Tool Used: clinical judgement Functional Limitation: Self care Self Care Current Status (G9562(G8987): At least 40 percent but less than 60 percent impaired, limited or restricted Self Care Goal Status (Z3086(G8988): At least 1 percent but less than 20 percent impaired, limited or restricted  Pilar GrammesMathews, Chane Cowden H 07/28/2015, 1:51 PM

## 2015-07-29 ENCOUNTER — Observation Stay (HOSPITAL_BASED_OUTPATIENT_CLINIC_OR_DEPARTMENT_OTHER): Payer: BLUE CROSS/BLUE SHIELD

## 2015-07-29 DIAGNOSIS — R29898 Other symptoms and signs involving the musculoskeletal system: Secondary | ICD-10-CM | POA: Diagnosis not present

## 2015-07-29 DIAGNOSIS — I6350 Cerebral infarction due to unspecified occlusion or stenosis of unspecified cerebral artery: Secondary | ICD-10-CM

## 2015-07-29 LAB — MAGNESIUM: MAGNESIUM: 1.6 mg/dL — AB (ref 1.7–2.4)

## 2015-07-29 LAB — CBC
HCT: 33.3 % — ABNORMAL LOW (ref 39.0–52.0)
Hemoglobin: 10.4 g/dL — ABNORMAL LOW (ref 13.0–17.0)
MCH: 31.3 pg (ref 26.0–34.0)
MCHC: 31.2 g/dL (ref 30.0–36.0)
MCV: 100.3 fL — ABNORMAL HIGH (ref 78.0–100.0)
PLATELETS: 138 10*3/uL — AB (ref 150–400)
RBC: 3.32 MIL/uL — AB (ref 4.22–5.81)
RDW: 13.8 % (ref 11.5–15.5)
WBC: 6.6 10*3/uL (ref 4.0–10.5)

## 2015-07-29 LAB — BASIC METABOLIC PANEL
Anion gap: 7 (ref 5–15)
BUN: 6 mg/dL (ref 6–20)
CO2: 23 mmol/L (ref 22–32)
Calcium: 8.8 mg/dL — ABNORMAL LOW (ref 8.9–10.3)
Chloride: 108 mmol/L (ref 101–111)
Creatinine, Ser: 1.18 mg/dL (ref 0.61–1.24)
Glucose, Bld: 114 mg/dL — ABNORMAL HIGH (ref 65–99)
POTASSIUM: 3.4 mmol/L — AB (ref 3.5–5.1)
SODIUM: 138 mmol/L (ref 135–145)

## 2015-07-29 LAB — ECHOCARDIOGRAM COMPLETE
HEIGHTINCHES: 72 in
WEIGHTICAEL: 3851.2 [oz_av]

## 2015-07-29 MED ORDER — AMLODIPINE BESYLATE 5 MG PO TABS
5.0000 mg | ORAL_TABLET | Freq: Every day | ORAL | Status: DC
  Administered 2015-07-29 – 2015-07-30 (×2): 5 mg via ORAL
  Filled 2015-07-29 (×2): qty 1

## 2015-07-29 MED ORDER — BACLOFEN 10 MG PO TABS
10.0000 mg | ORAL_TABLET | Freq: Two times a day (BID) | ORAL | Status: DC
  Administered 2015-07-29 – 2015-07-30 (×3): 10 mg via ORAL
  Filled 2015-07-29 (×3): qty 1

## 2015-07-29 MED ORDER — PERFLUTREN LIPID MICROSPHERE
1.0000 mL | INTRAVENOUS | Status: AC | PRN
  Administered 2015-07-29: 2 mL via INTRAVENOUS
  Filled 2015-07-29: qty 10

## 2015-07-29 MED ORDER — MAGNESIUM OXIDE 400 (241.3 MG) MG PO TABS
400.0000 mg | ORAL_TABLET | Freq: Every day | ORAL | Status: AC
  Administered 2015-07-29: 400 mg via ORAL
  Filled 2015-07-29: qty 1

## 2015-07-29 MED ORDER — POTASSIUM CHLORIDE CRYS ER 20 MEQ PO TBCR
40.0000 meq | EXTENDED_RELEASE_TABLET | Freq: Once | ORAL | Status: AC
  Administered 2015-07-29: 40 meq via ORAL
  Filled 2015-07-29: qty 2

## 2015-07-29 NOTE — Progress Notes (Signed)
Rehab admissions - I met with patient and gave him rehab brochures.  I have called and faxed information to [facility] requesting acute inpatient rehab admission.  I will not hear back from [facility] until Monday.  I will follow up then if patient is not discharged over the weekend.  Call me for questions.  #[phone]

## 2015-07-29 NOTE — Progress Notes (Signed)
Physical Therapy Treatment Patient Details Name: [name] [name] MRN: [id number] DOB: 01/15/1961 Today's Date: 07/29/2015    History of Present Illness Patient is a 55 y/o male with hx of HTN, DM, bipolar, Hepatic Encephalopathy, TBI in 1997, and alcohol abuse presents s/p fall at home with left sided weakness and dysarthria. Some fecal incontinence reported. MRI- unremarkable for acute CVA, remote lacunar infarcts, with advanced cerebral atrophy, concern for possible vascular dementia. MRI spine-Moderate canal stenosis at L3-4, L4-5 related to mild disc bulging. Cervical spine MRI- Degenerative disc bulges C3-6i    PT Comments    Patient is progressing toward mobility goals although continues to demonstrate balance and cognitive impairments. Pt tolerated gait training with high level balance activities. Current plan remains appropriate.   Follow Up Recommendations  CIR     Equipment Recommendations  Other (comment)    Recommendations for Other Services OT consult;Rehab consult     Precautions / Restrictions Precautions Precautions: Fall Precaution Comments: h/o falls Restrictions Weight Bearing Restrictions: No    Mobility  Bed Mobility Overal bed mobility: Needs Assistance Bed Mobility: Supine to Sit     Supine to sit: HOB elevated;Supervision     General bed mobility comments: increased time   Transfers Overall transfer level: Needs assistance Equipment used: Rolling walker (2 wheeled);None Transfers: Sit to/from Stand Sit to Stand: Min guard;Min assist         General transfer comment: min guard to stand with cues for hand placement and technique and min A upon standing to steady with no knee buckling  Ambulation/Gait Ambulation/Gait assistance: Min assist Ambulation Distance (Feet): 200 Feet Assistive device: None Gait Pattern/deviations: Step-through pattern;Decreased stride length;Staggering left;Staggering right Gait velocity: decreased   General Gait  Details: cues for cadence and posture; unsteady gait but no LOB and with  lateral sway   Stairs            Wheelchair Mobility    Modified Rankin (Stroke Patients Only) Modified Rankin (Stroke Patients Only) Pre-Morbid Rankin Score: Moderate disability Modified Rankin: Moderately severe disability     Balance Overall balance assessment: Needs assistance;History of Falls Sitting-balance support: No upper extremity supported;Feet supported Sitting balance-Leahy Scale: Fair     Standing balance support: No upper extremity supported Standing balance-Leahy Scale: Fair               High level balance activites: Backward walking;Direction changes;Turns;Sudden stops;Head turns High Level Balance Comments: gait deviations demonstrated with all high level balance activities but no LOB    Cognition Arousal/Alertness: Awake/alert Behavior During Therapy: WFL for tasks assessed/performed Overall Cognitive Status: No family/caregiver present to determine baseline cognitive functioning                      Exercises      General Comments General comments (skin integrity, edema, etc.): pt demonstrated emergent awareness in regards to deficits; pt asked if this (gait during therapy) meant he could go to the bathroom by himself and therapist explained that due to balance impairments he has to ask for assistance; pt verbalized understanding      Pertinent Vitals/Pain Pain Assessment: No/denies pain    Home Living     Available Help at Discharge: Family;Available PRN/intermittently Type of Home: House              Prior Function            PT Goals (current goals can now be found in the care plan section) Acute Rehab  PT Goals Patient Stated Goal: to be able to return to normal Progress towards PT goals: Progressing toward goals    Frequency  Min 4X/week    PT Plan Current plan remains appropriate    Co-evaluation             End of Session  Equipment Utilized During Treatment: Gait belt Activity Tolerance: Patient tolerated treatment well Patient left: in chair;with call bell/phone within reach;with chair alarm set     Time: 1300-1320 PT Time Calculation (min) (ACUTE ONLY): 20 min  Charges:  $Gait Training: 8-22 mins                    G Codes:      Derek M[place], PTA Pager: [phone]   07/29/2015, 1:42 PM

## 2015-07-29 NOTE — Care Management Note (Signed)
Case Management Note  Patient Details  Name: Jacobo Forestnthony L Kozel MRN: [id number] Date of Birth: 02/06/1961  Subjective/Objective:                    Action/Plan: Plan is CIR vs SNF. CSW notified of back up SNF. CM following for d/c needs.  Expected Discharge Date:                  Expected Discharge Plan:     In-House Referral:     Discharge planning Services     Post Acute Care Choice:    Choice offered to:     DME Arranged:    DME Agency:     HH Arranged:    HH Agency:     Status of Service:  In process, will continue to follow  Medicare Important Message Given:    Date Medicare IM Given:    Medicare IM give by:    Date Additional Medicare IM Given:    Additional Medicare Important Message give by:     If discussed at Long Length of Stay Meetings, dates discussed:    Additional Comments:  Kermit BaloKelli F Stafford [place], RN 07/29/2015, 4:00 PM

## 2015-07-29 NOTE — Progress Notes (Signed)
Family Medicine Teaching Service Daily Progress Note Intern Pager: [phone]  Patient name: [name] [name] Austill Medical record number: [id number] Date of birth: 01/21/1961 Age: 55 y.o. Gender: male  Primary Care Provider: Mickie HillierIan McKeag, MD Consultants: Neurology Code Status: Full  Pt Overview and Major Events to Date:  5/30: Admit to FPTS 6/1: high intensity Lipitor dose   Assessment and Plan: [name] [name] Lantier is a 55 y.o. male presenting following a fall with acute onset of worsening weakness in left lower extremity. PMH is significant for HTN, hepatomegaly, hepatic encephalopathy, Biopolar disorder, Anemia, alcoholism, tobacco dependence   Left leg weakness causing fall:  Risk factors for stroke include 20 pack years of smoking, HTN. Patient with previous stroke in 1999 per family. Acute stroke has been ruled out, including no acute changes on MRI brain and Carotid US but ECHO pending.  Due to hx of urinary/bowel incontinence over the last 2 months, spinal imaging obtained. Cervical CT with no spinal injury. Lumbar CT with no acute changes but with moderate canal stenosis at L3-4 and L4-5 related to mild disc bulging, facet disease.  - Lipitor 40mg  (high dose with history of CVA and also ASCVD 4059yr risk ~20%); ASA - Neuro check q4hrs (  Will discontinue)  - ECHO pending - Neurology consulted, appreciate their assistance: - telemetry  - Vitals per floor  - PT/OT/ST consulted : PT recommended CIR - CIR: accepts patient   Urinary / Bowel Incontinence, with lower ext weakness: Decreased urine output over the past 24 hours (after discontinuing IVF). Creatinine is stable from yesterday. Poor PO intake recorded (420cc).  See above A&P.    Hx of Recent Falls: Patient has hx of hepatic encephalopathy, sometimes compliant with lactulose per review of family medicine notes. On admission, no asterixis noted, however ammonia elevated to 55 this admission. Patient also with hx of alcohol abuse, UDS  positive for THC, Ethanol less than > 5. Hx of bipolar disease, on Lithium. Lithium toxicity can cause neurologic symptoms as well, therefore with check level for possibly toxicity  - Lithium level--> 0.78 (normal) - Will continue lactulose   Cognitive Impairment: Minicog completed 5/31 with score of 1. Additionally noted to have left hemineglect. MRI brain noted generalized cerebral atrophy with moderate chronic small vessel ischemic disease that is advanced for patient age.  - will touch base with family regarding baseline  AKI (resolved), on CKDII-III: Cr 1.60 (baseline 1.2) on admission. 1.18    Hx of GI bleed. Patient currently endorse blood in stool and dark stools. Hx of Upper GI bleeding 11/2014, positive for duodneal ulcers at that time. Hgb 12.5>14.3>11.0 > 10.5. Patient denies palpations, dizziness etc.  - FOBT while here--> negative  - Would consider outpatient work up with a colonoscopy   HTN, Home meds: metoprolol, spirolactone and Norvasc. BP overall stable to mildly elevated. 143-50/83-90.  - Holding spirolactone, metoprolol - will restart home Norvasc 5mg  daily   Hx of alcohol use  - Denies recent alcohol use  - CIWA protocol : scores 0 (will discontinue)  - Thiamine and Folate   Hepatic Encephalopathy: Ammonia 55, alert x self and place. Per family baseline does not know the month . BM x 1 over 24 hrs  - lactulose PRN, titrate for bowel movement   Biopolar Disorder  - Continue Prozac and Lithium   Hypokalemia: K 3.4 this AM (from 3.4) despite 40Kdur. Magnesium 1.6  - will replete both    FEN/GI: Heart Healthy; SLIV PPx: Lovenox  Disposition: Continue  to monitor, neurology recs  Subjective:  Doing well no concerns.  Spoke with wife on the phone. Reports she is responsible for finances and groceries since 1997 after Mr. Radle had a TBI.   Objective: Temp:  [97.9 F (36.6 C)-98.7 F (37.1 C)] 98.2 F (36.8 C) (06/02 0735) Pulse Rate:  [60-88] 88  (06/02 0735) Resp:  [16-18] 18 (06/02 0735) BP: (130-150)/(81-90) 144/81 mmHg (06/02 0735) SpO2:  [95 %-100 %] 96 % (06/02 0735) Physical Exam: General: Patient sitting up in bed, NAD  Cardiovascular: RRR, no murmurs  Respiratory: Anterior only, CTAB, no wheezes  Abdomen: protuberant, BS+, no ttp, no distention Skin: Extensive Bruising on left upper extremity  Neuro:  Mental Status: alert; oriented to person, place and year Cranial Nerves: extraocular movements are full and conjugate; pupils are round slightly asymmetric but reactive (R smaller than left); midline uvula, mildly left ward deviation of tongue which patient can correct,  mildly dysarthric speech Motor: 4/5 strength RUE (mainly deltoid muscle), 5/5 strength LUE, 5/5 strength lower extremities Sensory: grossly intact throughout Coordination: Heel to shin performed wnl, finger to nose not normal  Gait and Station: Not observed  Reflexes: LLE hyperreflexia, RLE normal reflexes, No clonus  Laboratory:  Recent Labs Lab 07/27/15 0528 07/28/15 0518 07/29/15 0546  WBC 9.8 7.0 6.6  HGB 11.0* 10.5* 10.4*  HCT 34.3* 33.5* 33.3*  PLT 122* 124* 138*    Recent Labs Lab 07/26/15 2204  07/27/15 0209 07/27/15 0528 07/28/15 0518 07/29/15 0546  NA 136  < > 136 136 139 138  K 3.9  < > 3.4* 3.1* 3.4* 3.4*  CL 111  < > 108 107 112* 108  CO2 19*  --  21* BUN 11  < > CREATININE 1.76*  < > 1.55* 1.44* 1.17 1.18  CALCIUM 9.6  --  9.5 9.1 8.8* 8.8*  PROT 6.8  --  6.8  --   --   --   BILITOT 1.1  --  1.0  --   --   --   ALKPHOS 56  --  50  --   --   --   ALT 76*  --  70*  --   --   --   AST 71*  --  65*  --   --   --   GLUCOSE 133*  < > 120* 103* 108* 114*  < > = values in this interval not displayed.  Imaging/Diagnostic Tests: No results found.   Palma Holter, MD 07/29/2015, 9:53 AM PGY-1, [place] [facility] FPTS Intern pager: [phone], text pages welcome

## 2015-07-29 NOTE — Evaluation (Signed)
Speech Language Pathology Evaluation Patient Details Name: [name] [name] MRN: [id number] DOB: 1960-03-18 Today's Date: 07/29/2015 Time: 1112-1127 SLP Time Calculation (min) (ACUTE ONLY): 15 min  Problem List:  Patient Active Problem List   Diagnosis Date Noted  . Left leg weakness 07/27/2015  . History of stroke, 53   . Absence of bladder continence   . Fall   . Essential hypertension   . Bipolar 1 disorder, mixed, moderate (HCC)   . AKI (acute kidney injury) (HCC)   . Gastrointestinal hemorrhage associated with duodenal ulcer   . Hyperkalemia 12/08/2014  . Tinea corporis 12/25/2011  . Hepatic encephalopathy (HCC) 09/03/2010  . Hepatic steatosis 06/09/2010  . Bipolar disorder (HCC) 05/14/2010  . Hepatomegaly 05/12/2010  . ANEMIA, FOLIC ACID DEFICIENCY 08/02/2009  . Abnormality of gait 07/28/2009  . Alcoholism (HCC) 07/11/2009  . PROTEINURIA 08/07/2006  . HYPERTRIGLYCERIDEMIA 04/25/2006  . GOUT, CHRONIC 04/25/2006  . OBESITY, NOS 04/25/2006  . DEPRESSION, MAJOR, RECURRENT 04/25/2006  . TOBACCO DEPENDENCE 04/25/2006  . HYPERTENSION, BENIGN SYSTEMIC 04/25/2006  . Peptic ulcer 04/25/2006  . IMPOTENCE, ORGANIC 04/25/2006   Past Medical History:  Past Medical History  Diagnosis Date  . Hypertension   . Diabetes mellitus   . Hyperkalemia 12/08/2014  . Bipolar disorder (HCC)   . Alcohol abuse   . Gastric ulcer   . Hepatic encephalopathy [facility])    Past Surgical History:  Past Surgical History  Procedure Laterality Date  . Esophagogastroduodenoscopy (egd) with propofol Left 12/09/2014    Procedure: ESOPHAGOGASTRODUODENOSCOPY (EGD) WITH PROPOFOL;  Surgeon: [name] Redbird, MD;  Location: [facility] ENDOSCOPY;  Service: Endoscopy;  Laterality: Left;   HPI:  Patient is a 55 y/o male with hx of HTN, DM, bipolar, Hepatic Encephalopathy, TBI in 1997, and alcohol abuse presents s/p fall at home with left sided weakness and dysarthria. Some fecal incontinence reported. MRI- unremarkable  for acute CVA, remote lacunar infarcts, with advanced cerebral atrophy, concern for possible vascular dementia. MRI spine-Moderate canal stenosis at L3-4, L4-5 related to mild disc bulging. Cervical spine MRI- Degenerative disc bulges C3-6i   Assessment / Plan / Recommendation Clinical Impression  Pt has cognitive impairments in the areas of selective attention, mildly complex problem solving, working memory, and intellectual awareness, although it is not clear what his baseline level of function is after prior TBI. Pt believes he is at his baseline, although this will likely have to be discussed with family as well given that he does not acknowledge his phsyical impairments either. Will f/u at least briefly to better assess.    SLP Assessment  Patient needs continued Speech Lanaguage Pathology Services    Follow Up Recommendations  Inpatient Rehab;24 hour supervision/assistance    Frequency and Duration    One additional visit      SLP Evaluation Prior Functioning  Cognitive/Linguistic Baseline: Baseline deficits Baseline deficit details: prior TBI in 1997, unclear what baseline is Type of Home: House  Lives With: Spouse Available Help at Discharge: Family;Available PRN/intermittently   Cognition  Overall Cognitive Status: No family/caregiver present to determine baseline cognitive functioning Arousal/Alertness: Awake/alert Orientation Level: Oriented X4 Attention: Sustained;Selective Sustained Attention: Appears intact Selective Attention: Impaired Selective Attention Impairment: Functional basic Memory: Impaired Memory Impairment: Decreased recall of new information Awareness: Impaired Awareness Impairment: Intellectual impairment;Anticipatory impairment;Emergent impairment Problem Solving: Impaired Problem Solving Impairment: Functional basic Safety/Judgment: Impaired    Comprehension  Auditory Comprehension Overall Auditory Comprehension: Impaired Commands:  Impaired One Step Basic Commands: 75-100% accurate Complex Commands: 50-74% accurate Conversation: Simple  Expression Expression Primary Mode of Expression: Verbal Verbal Expression Overall Verbal Expression: Appears within functional limits for tasks assessed   Oral / Motor      GO          Functional Assessment Tool Used: skilled clinical judgment Functional Limitations: Memory Memory Current Status (Z6109(G9168): At least 40 percent but less than 60 percent impaired, limited or restricted Memory Goal Status (U0454(G9169): At least 20 percent but less than 40 percent impaired, limited or restricted          [name] [name], M.A. CCC-SLP [phone]  [name] [name], [name] [name] 07/29/2015, 1:00 PM

## 2015-07-30 DIAGNOSIS — R29898 Other symptoms and signs involving the musculoskeletal system: Secondary | ICD-10-CM | POA: Diagnosis not present

## 2015-07-30 DIAGNOSIS — R269 Unspecified abnormalities of gait and mobility: Secondary | ICD-10-CM | POA: Diagnosis not present

## 2015-07-30 DIAGNOSIS — N179 Acute kidney failure, unspecified: Secondary | ICD-10-CM | POA: Diagnosis not present

## 2015-07-30 LAB — BASIC METABOLIC PANEL
Anion gap: 9 (ref 5–15)
BUN: 8 mg/dL (ref 6–20)
CALCIUM: 9 mg/dL (ref 8.9–10.3)
CO2: 23 mmol/L (ref 22–32)
Chloride: 107 mmol/L (ref 101–111)
Creatinine, Ser: 1.21 mg/dL (ref 0.61–1.24)
GFR calc Af Amer: >60 mL/min (ref >60)
GLUCOSE: 124 mg/dL — AB (ref 65–99)
Potassium: 3.8 mmol/L (ref 3.5–5.1)
Sodium: 139 mmol/L (ref 135–145)

## 2015-07-30 LAB — MAGNESIUM: Magnesium: 1.7 mg/dL (ref 1.7–2.4)

## 2015-07-30 MED ORDER — SENNOSIDES-DOCUSATE SODIUM 8.6-50 MG PO TABS: 1.0000 | ORAL_TABLET | Freq: Every evening | ORAL | Status: DC | PRN

## 2015-07-30 MED ORDER — AMLODIPINE BESYLATE 5 MG PO TABS: 5.0000 mg | ORAL_TABLET | Freq: Every day | ORAL | Status: DC

## 2015-07-30 MED ORDER — BACLOFEN 10 MG PO TABS: 10.0000 mg | ORAL_TABLET | Freq: Two times a day (BID) | ORAL | Status: DC

## 2015-07-30 MED ORDER — NICOTINE 21 MG/24HR TD PT24: 21.0000 mg | MEDICATED_PATCH | Freq: Every day | TRANSDERMAL | Status: DC

## 2015-07-30 NOTE — Progress Notes (Signed)
Family Medicine Teaching Service Daily Progress Note Intern Pager: [phone]  Patient name: [name] [name] Medical record number: [id number] Date of birth: Nov 13, 1960 Age: 55 y.o. Gender: male  Primary Care Provider: Mickie Hillier, MD Consultants: Neurology Code Status: Full  Pt Overview and Major Events to Date:  5/30: Admit to FPTS 6/1: high intensity Lipitor dose   Assessment and Plan: [name] [name] is a 55 y.o. male presenting following a fall with acute onset of worsening weakness in left lower extremity. PMH is significant for HTN, hepatomegaly, hepatic encephalopathy, Biopolar disorder, Anemia, alcoholism, tobacco dependence   Left leg weakness causing fall:  Risk factors for stroke include 20 pack years of smoking, HTN. Patient with previous stroke in 1999 per family. Acute stroke has been ruled out, including no acute changes on MRI brain and Carotid US but ECHO pending.  Due to hx of urinary/bowel incontinence over the last 2 months, spinal imaging obtained. Cervical CT with no spinal injury. Lumbar CT with no acute changes but with moderate canal stenosis at L3-4 and L4-5 related to mild disc bulging, facet disease. Echo EF 60-65% - Lipitor  (high dose with history of CVA and also ASCVD 6yr risk ~20%); ASA - Neurology consulted, appreciate their assistance: - Vitals per floor  - PT/OT/ST consulted : PT recommended CIR - CIR: accepts patient but unable to accept until 6/2 due to insurance issues - SW consulted for consideration of SNF and given his insurance company is closed now and no work up for placement has been started yet, it is unlikely he will be placed in SNF over the weekend - patient refusing SNF and wants to go home   Hx of Recent Falls: Patient has hx of hepatic encephalopathy, sometimes compliant with lactulose per review of family medicine notes. On admission, no asterixis noted, however ammonia elevated to 55 this admission. Patient also with hx of alcohol  abuse, UDS positive for THC, Ethanol less than > 5. Hx of bipolar disease, on Lithium. Lithium toxicity can cause neurologic symptoms as well, therefore with check level for possibly toxicity  - Lithium level--> 0.78 (normal) - Will continue lactulose   Cognitive Impairment: Minicog completed 5/31 with score of 1. Additionally noted to have left hemineglect. MRI brain noted generalized cerebral atrophy with moderate chronic small vessel ischemic disease that is advanced for patient age.  - does have baseline mild cognitive impairment  AKI (resolved), on CKDII-III: Cr 1.60 on admission. Baseline 1.2 today 1.21 - follow Cr as needed  Hx of GI bleed. Stable now with endorsement of dark stools on admission - FOBT while here--> negative  - Would consider outpatient work up with a colonoscopy   HTN, Home meds: metoprolol, spirolactone and Norvasc. BP overall stable to mildly elevated.  - Holding spirolactone, metoprolol - will restart home Norvasc  daily   Hx of alcohol use  - Denies recent alcohol use  - CIWA protocol : scores 0 (will discontinue)  - Thiamine and Folate   Hepatic Encephalopathy: Ammonia 55, alert x self and place. Per family baseline does not know the month . BM x 1 over 24 hrs  - lactulose PRN, titrate for bowel movement   Biopolar Disorder  - Continue Prozac and Lithium   Hypokalemia/hypomagnesemia: Resolved with repletion  - continue to follow  FEN/GI: Heart Healthy; SLIV PPx: Lovenox  Disposition: D/c today to home with home health  Subjective:  Doing well no concerns.    Objective: Temp:  [97.9 F (36.6 C)-98.6  F (37 C)] 98.2 F (36.8 C) (06/03 0546) Pulse Rate:  [67-93] 72 (06/03 0546) Resp:  [18] 18 (06/03 0546) BP: (134-155)/(80-93) 155/91 mmHg (06/03 0546) SpO2:  [96 %-100 %] 97 % (06/03 0546) Physical Exam: General: Patient sitting up in bed, NAD ,eating breakfast Cardiovascular: RRR, no murmurs  Respiratory:  CTAB, no wheezes   Abdomen: protuberant, BS+, no ttp, no distention Neuro: alert to person place, Month and year and reason why in the hospital  Laboratory:  Recent Labs Lab 07/27/15 0528 07/28/15 0518 07/29/15 0546  WBC 9.8 7.0 6.6  HGB 11.0* 10.5* 10.4*  HCT 34.3* 33.5* 33.3*  PLT 122* 124* 138*    Recent Labs Lab 07/26/15 2204  07/27/15 0209  07/28/15 0518 07/29/15 0546 07/30/15 0621  NA 136  < > 136  < > 139 138 139  K 3.9  < > 3.4*  < > 3.4* 3.4* 3.8  CL 111  < > 108  < > 112* 108 107  CO2 19*  --  21*  < > 22 23 23   BUN 11  < > 11  < > 8 6 8   CREATININE 1.76*  < > 1.55*  < > 1.17 1.18 1.21  CALCIUM 9.6  --  9.5  < > 8.8* 8.8* 9.0  PROT 6.8  --  6.8  --   --   --   --   BILITOT 1.1  --  1.0  --   --   --   --   ALKPHOS 56  --  50  --   --   --   --   ALT 76*  --  70*  --   --   --   --   AST 71*  --  65*  --   --   --   --   GLUCOSE 133*  < > 120*  < > 108* 114* 124*  < > = values in this interval not displayed.  Imaging/Diagnostic Tests: No results found.   Bonney AidAlyssa A Dyke Weible, MD 07/30/2015, 8:17 AM PGY-2,  Family Medicine FPTS Intern pager: [phone], text pages welcome

## 2015-07-30 NOTE — Clinical Social Work Note (Signed)
Clinical Social Worker met with patient in regards to SNF placement. Patient kindly declined SNF placement and stated he prefers to return home at discharge.  CSW to notify RNCM.   Clinical Social Worker will sign off for now as social work intervention is no longer needed. Please consult [place] again if new need arises.  Glendon Axe, MSW, LCSWA [phone] 07/30/2015 11:28 AM

## 2015-07-30 NOTE — Care Management Note (Addendum)
Case Management Note  Patient Details  Name: [name] [name] MRN: [id number] Date of Birth: 06/02/1960  Subjective/Objective:                  . Fall  . Stroke Symptoms        Action/Plan: Discharge planning  Expected Discharge Date:                  Expected Discharge Plan:  [place]  In-House Referral:     Discharge planning Services  CM Consult  Post Acute Care Choice:    Choice offered to:  Patient  DME Arranged:  Walker rolling DME Agency:  [place] Arranged:  PT, OT, RN, Nurse's Aide, Social Work [employer] Agency:  [place]  Status of Service:  Completed, signed off  Medicare Important Message Given:    Date Medicare IM Given:    Medicare IM give by:    Date Additional Medicare IM Given:    Additional Medicare Important Message give by:     If discussed at [place] of Stay Meetings, dates discussed:    Additional Comments: CM received call from [place] stating family has chosen to only receive HHPT. Cm received calls from both CSW and MD confirming discharge and called referral to [facility] rep, Tiffany for [facility] services.  CM called [facility] DME rep, Merry Proud to please deliver the rolling walker to room so pt can discharge.  No other CM needs wre communicated. CM met with pt to discuss discharge.  Pt states he does NOT want to go to SNF and wishes to go home, today.  CM discussed [facility] services and DME and pt states he could use a rolling walker at home but declines need for a bedside commode.  CM has called CSW, Marjorie Smolder to please touch base with pt to discuss SNF to ensure pt is making the best decision possible for his needs.  CM has discussed with MD and waiting for CSW to meet with pt before arranging [facility] services.   Dellie Catholic, RN 07/30/2015, 10:48 AM

## 2015-07-30 NOTE — Progress Notes (Signed)
D/C orders received, pt for D/C home today with [facility].  IV and telemetry D/C.  Rx and D/C instructions given with verbalized understanding.  Family at bedside to assist with D/C.  Staff brought pt downstairs via wheelchair.  

## 2015-07-30 NOTE — Discharge Instructions (Signed)
Follow-up Information    Follow up with Mickie HillierIan McKeag, MD On 08/12/2015.   Specialty:  Family Medicine   Why:  9:30 AM   Contact information:   [address] [place] [address] [phone]

## 2015-07-30 NOTE — Progress Notes (Signed)
Physical Therapy Treatment Patient Details Name: [name] [name] MRN: [id number] DOB: 02/08/1961 Today's Date: 07/30/2015    History of Present Illness Patient is a 55 y/o male with hx of HTN, DM, bipolar, Hepatic Encephalopathy, TBI in 1997, and alcohol abuse presents s/p fall at home with left sided weakness and dysarthria. Some fecal incontinence reported. MRI- unremarkable for acute CVA, remote lacunar infarcts, with advanced cerebral atrophy, concern for possible vascular dementia. MRI spine-Moderate canal stenosis at L3-4, L4-5 related to mild disc bulging. Cervical spine MRI- Degenerative disc bulges C3-6i    PT Comments    Pt seen today for mobility and is now going home vs CIR. Trialed RW with gait today (vs no AD as with previous session). Pt with increased stability and improved gait quality/safety. therefore will recommend pt have RW for home discharge today, along with family supervision. Pt's family member in room and stated he will have supervision "most of the time" from family and friends.    Follow Up Recommendations  CIR     Equipment Recommendations  Rolling walker with 5" wheels    Recommendations for Other Services OT consult     Precautions / Restrictions Precautions Precautions: Fall Precaution Comments: h/o falls Restrictions Weight Bearing Restrictions: No    Mobility  Bed Mobility                  Transfers Overall transfer level: Needs assistance Equipment used: Rolling walker (2 wheeled) Transfers: Sit to/from Stand           General transfer comment: demo'd safe technique with increased time needed  Ambulation/Gait Ambulation/Gait assistance: Min guard;Supervision Ambulation Distance (Feet): 200 Feet Assistive device: Rolling walker (2 wheeled) Gait Pattern/deviations: Step-through pattern;Ataxic   Gait velocity interpretation: at or above normal speed for age/gender General Gait Details: mildly ataxic leg movements, however no  balance loss or other instability noted with use of walker       Cognition Arousal/Alertness: Awake/alert Behavior During Therapy: WFL for tasks assessed/performed Overall Cognitive Status: Within Functional Limits for tasks assessed                       Pertinent Vitals/Pain Pain Assessment: No/denies pain     PT Goals (current goals can now be found in the care plan section) Acute Rehab PT Goals Patient Stated Goal: to be able to return to normal PT Goal Formulation: With patient Time For Goal Achievement: 08/11/15 Potential to Achieve Goals: Fair Progress towards PT goals: Progressing toward goals    Frequency  Min 4X/week    PT Plan Current plan remains appropriate    End of Session Equipment Utilized During Treatment: Gait belt Activity Tolerance: Patient tolerated treatment well Patient left: in chair;with call bell/phone within reach;with chair alarm set;with family/visitor present     Time: 1250-1302 PT Time Calculation (min) (ACUTE ONLY): 12 min  Charges:  $Gait Training: 8-22 mins           Sallyanne KusterBury, Kathy 07/30/2015, 1:07 PM   Sallyanne KusterKathy Bury, PTA, CLT Acute Rehab Services [phone] 07/30/2015, 1:09 PM

## 2015-08-01 DIAGNOSIS — M62838 Other muscle spasm: Secondary | ICD-10-CM | POA: Insufficient documentation

## 2015-08-12 ENCOUNTER — Ambulatory Visit: Payer: BLUE CROSS/BLUE SHIELD | Admitting: Family Medicine

## 2015-08-12 ENCOUNTER — Ambulatory Visit (INDEPENDENT_AMBULATORY_CARE_PROVIDER_SITE_OTHER): Payer: BLUE CROSS/BLUE SHIELD | Admitting: Family Medicine

## 2015-08-12 VITALS — BP 132/92 | HR 63 | Temp 98.4°F | Wt 248.0 lb

## 2015-08-12 DIAGNOSIS — D649 Anemia, unspecified: Secondary | ICD-10-CM | POA: Diagnosis not present

## 2015-08-12 DIAGNOSIS — Z79899 Other long term (current) drug therapy: Secondary | ICD-10-CM

## 2015-08-12 DIAGNOSIS — K709 Alcoholic liver disease, unspecified: Secondary | ICD-10-CM | POA: Diagnosis not present

## 2015-08-12 DIAGNOSIS — Z8673 Personal history of transient ischemic attack (TIA), and cerebral infarction without residual deficits: Secondary | ICD-10-CM

## 2015-08-12 DIAGNOSIS — I10 Essential (primary) hypertension: Secondary | ICD-10-CM

## 2015-08-12 DIAGNOSIS — R292 Abnormal reflex: Secondary | ICD-10-CM

## 2015-08-12 LAB — CBC
HEMATOCRIT: 36.4 % — AB (ref 38.5–50.0)
HEMOGLOBIN: 12 g/dL — AB (ref 13.2–17.1)
MCH: 32.9 pg (ref 27.0–33.0)
MCHC: 33 g/dL (ref 32.0–36.0)
MCV: 99.7 fL (ref 80.0–100.0)
MPV: 9.8 fL (ref 7.5–12.5)
Platelets: 265 10*3/uL (ref 140–400)
RBC: 3.65 MIL/uL — AB (ref 4.20–5.80)
RDW: 14.3 % (ref 11.0–15.0)
WBC: 7 10*3/uL (ref 3.8–10.8)

## 2015-08-12 LAB — VITAMIN B12: Vitamin B-12: 412 pg/mL (ref 200–1100)

## 2015-08-12 LAB — FOLATE: Folate: >24 ng/mL (ref >5.4)

## 2015-08-12 MED ORDER — ATORVASTATIN CALCIUM 40 MG PO TABS: 40.0000 mg | ORAL_TABLET | Freq: Every day | ORAL | Status: DC

## 2015-08-12 NOTE — Progress Notes (Signed)
   HPI  CC: Hospital follow-up Patient is here for hospital follow-up. He has wife states that he has had no setbacks since discharge. He feels fine and has been taking his medications as prescribed. He states that on discharge from the hospital he had been using a walker to help ambulate. Since discharge he is required less and less from this walker and today was able to ambulate to his office appointment without the use of the walker.  ROS: He denies dizziness, confusion, headaches, vision changes, dysphagia, chest pain, shortness of breath, nausea, vomiting, diarrhea, paresthesias, weakness, numbness, falls, or injuries.   Objective: BP 132/92 mmHg  Pulse 63  Temp(Src) 98.4 F (36.9 C) (Oral)  Wt 248 lb (112.492 kg) Gen: NAD, alert, cooperative, and pleasant. HEENT: NCAT, EOMI, pupils reactive to light bilaterally, left pupil slightly larger than right. MMM, neck FROM CV: RRR, no murmur Resp: CTAB, no wheezes, non-labored Abd: SNTND, BS present. Ext: No edema, warm Neuro: Alert and oriented, Speech clear but slow, strength 4/5 and equal throughout, DTRs +2 in upper extremities and left lower extremity, DTR 0 and left lower extremity, no evidence of clonus, no sensation deficits, gait slow but normal.  Assessment and plan:  History of stroke Residual symptoms appear to be improving: Patient relation appears to be improving. Today he and his wife have no complaints this time. Per the neurology notes from his recent hospitalization there is not appear to be any recommendation for follow-up with neurology at this time. - Increasing patient's Lipitor at this time. - Obtaining vitamin B-12, folate, and RPR due to patient's lower extremity weakness and residual symptoms    Orders Placed This Encounter  Procedures  . AMMONIA  . Vitamin B12  . Folate  . CBC  . BASIC METABOLIC PANEL WITH GFR  . RPR    Meds ordered this encounter  Medications  . atorvastatin (LIPITOR) 40 MG  tablet    Sig: Take 1 tablet (40 mg total) by mouth daily.    Dispense:  90 tablet    Refill:  3     Kathee DeltonIan D McKeag, MD,MS,  PGY2 08/13/2015 12:29 AM

## 2015-08-12 NOTE — Patient Instructions (Signed)
It was a pleasure seeing you today in our clinic. Today we discussed your recent hospital visit. Here is the treatment plan we have discussed and agreed upon together:   - I have increased your atorvastatin to 40 mg daily. You may take 2 tablets of your current atorvastatin prescription until the bottle is empty. When you go for a refill of this medication keep in mind that the new prescription will only require you to take one tablet daily. - I would like for you to set up a colonoscopy. I provided you with the information needed to call and set up this appointment. - Keep taking your medications as prescribed and if you have any questions do not hesitate to call our office.

## 2015-08-13 LAB — BASIC METABOLIC PANEL WITH GFR
BUN: 14 mg/dL (ref 7–25)
CALCIUM: 9 mg/dL (ref 8.6–10.3)
CO2: 21 mmol/L (ref 20–31)
CREATININE: 1.11 mg/dL (ref 0.70–1.33)
Chloride: 107 mmol/L (ref 98–110)
GFR, Est African American: 85 mL/min (ref >=60)
GFR, Est Non African American: 74 mL/min (ref >=60)
Glucose, Bld: 109 mg/dL — ABNORMAL HIGH (ref 65–99)
Potassium: 4.4 mmol/L (ref 3.5–5.3)
Sodium: 138 mmol/L (ref 135–146)

## 2015-08-13 LAB — RPR

## 2015-08-13 LAB — AMMONIA: AMMONIA: 62 umol/L — AB (ref 16–53)

## 2015-08-13 NOTE — Assessment & Plan Note (Signed)
Residual symptoms appear to be improving: Patient relation appears to be improving. Today he and his wife have no complaints this time. Per the neurology notes from his recent hospitalization there is not appear to be any recommendation for follow-up with neurology at this time. - Increasing patient's Lipitor at this time. - Obtaining vitamin B-12, folate, and RPR due to patient's lower extremity weakness and residual symptoms

## 2015-08-16 ENCOUNTER — Encounter: Payer: Self-pay | Admitting: Gastroenterology

## 2015-08-19 ENCOUNTER — Other Ambulatory Visit: Payer: Self-pay | Admitting: Family Medicine

## 2015-09-07 ENCOUNTER — Ambulatory Visit (AMBULATORY_SURGERY_CENTER): Payer: Self-pay | Admitting: *Deleted

## 2015-09-07 DIAGNOSIS — Z1211 Encounter for screening for malignant neoplasm of colon: Secondary | ICD-10-CM

## 2015-10-05 ENCOUNTER — Encounter: Payer: BLUE CROSS/BLUE SHIELD | Admitting: Gastroenterology

## 2015-12-15 ENCOUNTER — Other Ambulatory Visit: Payer: Self-pay | Admitting: Family Medicine

## 2016-01-20 ENCOUNTER — Other Ambulatory Visit: Payer: Self-pay | Admitting: Family Medicine

## 2016-01-24 MED ORDER — PANTOPRAZOLE SODIUM 40 MG PO TBEC: DELAYED_RELEASE_TABLET | ORAL | 5 refills | Status: DC

## 2016-02-13 ENCOUNTER — Ambulatory Visit (INDEPENDENT_AMBULATORY_CARE_PROVIDER_SITE_OTHER): Payer: BLUE CROSS/BLUE SHIELD | Admitting: Family Medicine

## 2016-02-13 VITALS — BP 145/100 | HR 66 | Temp 97.6°F | Wt 246.0 lb

## 2016-02-13 DIAGNOSIS — Z23 Encounter for immunization: Secondary | ICD-10-CM | POA: Diagnosis not present

## 2016-02-13 DIAGNOSIS — M25561 Pain in right knee: Secondary | ICD-10-CM | POA: Diagnosis not present

## 2016-02-13 DIAGNOSIS — G8929 Other chronic pain: Secondary | ICD-10-CM | POA: Diagnosis not present

## 2016-02-13 DIAGNOSIS — I10 Essential (primary) hypertension: Secondary | ICD-10-CM

## 2016-02-13 MED ORDER — NICOTINE 21 MG/24HR TD PT24: 21.0000 mg | MEDICATED_PATCH | Freq: Every day | TRANSDERMAL | 2 refills | Status: DC

## 2016-02-13 MED ORDER — METHYLPREDNISOLONE ACETATE 80 MG/ML IJ SUSP
80.0000 mg | Freq: Once | INTRAMUSCULAR | Status: AC
  Administered 2016-02-13: 40 mg via INTRAMUSCULAR

## 2016-02-13 NOTE — Assessment & Plan Note (Signed)
Patient has worsening knee pain. Signs and symptoms with physical exam consistent with osteoarthritis. - Steroid injection provided today. Patient tolerated well and had significant improvement shortly thereafter.

## 2016-02-13 NOTE — Patient Instructions (Signed)
It was a pleasure seeing you today in our clinic. Today we discussed your medications and right knee pain. Here is the treatment plan we have discussed and agreed upon together:   - Continue taking your medications as prescribed. - I've increased your amlodipine from 5 mg daily to 10 mg daily. Begin taking 2 of your current tablets once a day, when you get a refill on this prescription the new dosage will be 10 mg you should only take 1 tablet at that time. - The knee injection that we provided today should help with your knee pain over the next 5-7 days. If you do not receive at least 3 months of relief from this injection then we will likely no longer treat this pain this way. Please let me know if you have any questions.

## 2016-02-13 NOTE — Assessment & Plan Note (Signed)
Worse: Patient is here with worsening hypertension. He endorses good compliance with all medications. - Increase amlodipine from 5 mg daily to 10 mg daily

## 2016-02-13 NOTE — Progress Notes (Signed)
   HPI  CC: Knee pain and medications Patient is here to discuss his knee pain and medications. Regarding his medications he states that he has been very compliant. He denies any issues with his regimen at this time and has been feeling well overall. Patient is just wanting us to go through them individually to make sure that he is taking them appropriately. Patient denies any symptoms of hypotension.  Knee pain Patient is endorsing worsening bilateral knee pain with his right knee being worse than the left. He denies any injury or trauma. He endorses some crepitus but no locking or catching. No significant swelling or erythema. He states that the pain is worse when he gets up from a seated position. Pain is located primarily at the medial and anterior aspect of the right knee. He denies any fevers, chills, numbness, weakness, or paresthesias.  Review of Systems    See HPI for ROS. All other systems reviewed and are negative.  CC, SH/smoking status, and VS noted  Objective: BP (!) 145/100   Pulse 66   Temp 97.6 F (36.4 C)   Wt 246 lb (111.6 kg)   SpO2 97%   BMI 33.36 kg/m  Gen: NAD, alert, cooperative, and pleasant. CV: RRR, no murmur Resp: CTAB, no wheezes, non-labored Knee, Right: No obvious effusion, erythema, skin lesions, or bony deformity. Tenderness to palpation along the medial joint line and inferior pole of the patella. Range of motion intact. Muscle tone slightly increased but symmetric. +1 crepitus over the patella. All 4 ligaments intact. ROM slightly reduced due to muscle tone. Ext: No edema, warm Neuro: Alert and oriented, Speech clear, No gross deficits   INJECTION: Knee, Right Patient was given informed consent, signed copy in the chart. Appropriate time out was taken. Area prepped and draped in usual sterile fashion. 1 cc of methylprednisolone 40 mg/ml plus  4 cc of 1% lidocaine without epinephrine was injected into the right knee using a(n) anterolateral approach.  The patient tolerated the procedure well. There were no complications. Post procedure instructions were given.   Assessment and plan:  Essential hypertension Worse: Patient is here with worsening hypertension. He endorses good compliance with all medications. - Increase amlodipine from 5 mg daily to 10 mg daily  Chronic pain of right knee Patient has worsening knee pain. Signs and symptoms with physical exam consistent with osteoarthritis. - Steroid injection provided today. Patient tolerated well and had significant improvement shortly thereafter.   Orders Placed This Encounter  Procedures  . Flu Vaccine QUAD 36+ mos IM    Meds ordered this encounter  Medications  . nicotine (NICODERM CQ - DOSED IN MG/24 HOURS) 21 mg/24hr patch    Sig: Place 1 patch (21 mg total) onto the skin daily.    Dispense:  28 patch    Refill:  2  . methylPREDNISolone acetate (DEPO-MEDROL) injection 80 mg     Kathee DeltonIan D McKeag, MD,MS,  PGY3 02/13/2016 5:47 PM

## 2016-08-14 ENCOUNTER — Other Ambulatory Visit: Payer: Self-pay | Admitting: Family Medicine

## 2016-08-14 DIAGNOSIS — Z79899 Other long term (current) drug therapy: Secondary | ICD-10-CM

## 2016-08-14 DIAGNOSIS — I10 Essential (primary) hypertension: Secondary | ICD-10-CM

## 2016-08-17 ENCOUNTER — Other Ambulatory Visit: Payer: Self-pay | Admitting: Family Medicine

## 2016-09-12 ENCOUNTER — Other Ambulatory Visit: Payer: Self-pay | Admitting: *Deleted

## 2016-09-12 MED ORDER — PANTOPRAZOLE SODIUM 40 MG PO TBEC: DELAYED_RELEASE_TABLET | ORAL | 5 refills | Status: DC

## 2016-09-13 ENCOUNTER — Other Ambulatory Visit: Payer: Self-pay | Admitting: *Deleted

## 2016-09-14 MED ORDER — METOPROLOL TARTRATE 100 MG PO TABS: 100.0000 mg | ORAL_TABLET | Freq: Two times a day (BID) | ORAL | 5 refills | Status: DC

## 2016-09-14 MED ORDER — ALLOPURINOL 300 MG PO TABS: 300.0000 mg | ORAL_TABLET | Freq: Every day | ORAL | 5 refills | Status: DC

## 2016-09-14 NOTE — Telephone Encounter (Signed)
2nd request.  Martin, Tamika L, RN  

## 2016-10-11 ENCOUNTER — Ambulatory Visit (INDEPENDENT_AMBULATORY_CARE_PROVIDER_SITE_OTHER): Payer: BLUE CROSS/BLUE SHIELD | Admitting: Internal Medicine

## 2016-10-11 ENCOUNTER — Encounter: Payer: Self-pay | Admitting: Internal Medicine

## 2016-10-11 VITALS — BP 162/100 | HR 66 | Temp 98.1°F | Ht 72.0 in | Wt 247.6 lb

## 2016-10-11 DIAGNOSIS — E785 Hyperlipidemia, unspecified: Secondary | ICD-10-CM

## 2016-10-11 DIAGNOSIS — Z79899 Other long term (current) drug therapy: Secondary | ICD-10-CM

## 2016-10-11 DIAGNOSIS — I10 Essential (primary) hypertension: Secondary | ICD-10-CM

## 2016-10-11 DIAGNOSIS — Z0000 Encounter for general adult medical examination without abnormal findings: Secondary | ICD-10-CM

## 2016-10-11 DIAGNOSIS — F17200 Nicotine dependence, unspecified, uncomplicated: Secondary | ICD-10-CM

## 2016-10-11 MED ORDER — AMLODIPINE BESYLATE 10 MG PO TABS: 10.0000 mg | ORAL_TABLET | Freq: Every day | ORAL | 3 refills | Status: DC

## 2016-10-11 MED ORDER — NICOTINE 21 MG/24HR TD PT24
21.0000 mg | MEDICATED_PATCH | Freq: Every day | TRANSDERMAL | 2 refills | Status: DC
Start: 2016-10-11 — End: 2017-05-08

## 2016-10-11 NOTE — Progress Notes (Signed)
Subjective:    KALDEN WANKE - 56 y.o. male MRN [id number]  Date of birth: March 30, 1960  HPI  ONDRA DEBOARD is here for medication refills.  Chronic HTN Disease Monitoring:  Home BP Monitoring - No Chest pain- no  Dyspnea- no Headache - no  Medications: Metoprolol 100 mg BID and Amlodipine 5 mg daily   Compliance- yes Lightheadedness- no  Edema- no   HYPERLIPIDEMIA Symptoms Chest pain on exertion:  no    Leg claudication:   no  Medication Monitoring Compliance- Patient has ASA 81 mg with him today. He does not have Lipitor that appears on medication list. Believes he has taken this in the recent past.   Right upper quadrant pain- no   Muscle aches- no   Tobacco Use: Patient smoking over 1/2 ppd of unfiltered [place] cigarettes. Wants to quit smoking and requests nicotine patch.   Health Maintenance:  Health Maintenance Due  Topic Date Due  . COLONOSCOPY  09/02/2008  . INFLUENZA VACCINE  09/26/2016    -  reports that he has been smoking Cigarettes.  He has a 15.00 pack-year smoking history. He has never used smokeless tobacco. - Review of Systems: Per HPI. - Past Medical History: Patient Active Problem List   Diagnosis Date Noted  . Chronic pain of right knee 02/13/2016  . Spasm of muscle   . Left leg weakness 07/27/2015  . History of stroke   . Absence of bladder continence   . Fall   . Essential hypertension   . Bipolar 1 disorder, mixed, moderate (HCC)   . AKI (acute kidney injury) (HCC)   . Gastrointestinal hemorrhage associated with duodenal ulcer   . Hyperkalemia 12/08/2014  . Tinea corporis 12/25/2011  . Hepatic encephalopathy (HCC) 09/03/2010  . Hepatic steatosis 06/09/2010  . Bipolar disorder (HCC) 05/14/2010  . Hepatomegaly 05/12/2010  . ANEMIA, FOLIC ACID DEFICIENCY 08/02/2009  . Abnormality of gait 07/28/2009  . Alcoholism (HCC) 07/11/2009  . PROTEINURIA 08/07/2006  . HYPERTRIGLYCERIDEMIA 04/25/2006  . GOUT, CHRONIC  04/25/2006  . OBESITY, NOS 04/25/2006  . DEPRESSION, MAJOR, RECURRENT 04/25/2006  . TOBACCO DEPENDENCE 04/25/2006  . HYPERTENSION, BENIGN SYSTEMIC 04/25/2006  . Peptic ulcer 04/25/2006  . IMPOTENCE, ORGANIC 04/25/2006   - Medications: reviewed and updated   Objective:   Physical Exam BP (!) 162/100   Pulse 66   Temp 98.1 F (36.7 C) (Oral)   Ht 6' (1.829 m)   Wt 247 lb 9.6 oz (112.3 kg)   SpO2 96%   BMI 33.58 kg/m  Gen: NAD, alert, cooperative with exam, well-appearing HEENT: NCAT, PERRL, clear conjunctiva, oropharynx clear, supple neck CV: RRR, good S1/S2, no murmur, no edema, capillary refill brisk  Resp: CTABL, no wheezes, non-labored Abd: SNTND, BS present, no guarding or organomegaly Skin: no rashes, normal turgor  Neuro: no gross deficits.  Psych: good insight, alert and oriented     Assessment & Plan:   1. Essential hypertension Uncontrolled today. Will increase Amlodipine to 10 mg daily from 5 mg daily.  - amLODipine (NORVASC) 10 MG tablet; Take 1 tablet (10 mg total) by mouth daily.  Dispense: 90 tablet; Refill: 3 - Comprehensive metabolic panel -return in one week for RN BP check   2. Hyperlipidemia, unspecified hyperlipidemia type Patient with history of CVA. Suspect statin therapy has somehow fallen off patients medications. Will check lipid panel and resume high intensity statin.  - Lipid Panel -continue ASA 81 mg   3. High risk medication use Patient taking  lithium. Will send lab results to Dr. Talbert ForestPolvsky at [facility].  - Comprehensive metabolic panel - TSH - CBC with Differential  4. Tobacco use disorder Patient desiring to quit smoking and requests nicotine patch.  - nicotine (NICODERM CQ - DOSED IN MG/24 HOURS) 21 mg/24hr patch; Place 1 patch (21 mg total) onto the skin daily.  Dispense: 28 patch; Refill: 2  5. Healthcare maintenance -recommended colonoscopy   Marcy Sirenatherine Wallace, D.O. 10/11/2016, 4:39 PM PGY-3, [facility]

## 2016-10-11 NOTE — Patient Instructions (Signed)
I have increased your Amlodipine to 10 mg. Please return in one week for your BP check with the nurse.   I have sent in a nicotine patch as well.   We will call you with the lab results and send them to your psychiatrist.   Larence PenningNice to meet you,   Dr. Earlene PlaterWallace

## 2016-10-12 ENCOUNTER — Encounter: Payer: Self-pay | Admitting: Internal Medicine

## 2016-10-12 ENCOUNTER — Telehealth: Payer: Self-pay | Admitting: Internal Medicine

## 2016-10-12 DIAGNOSIS — Z79899 Other long term (current) drug therapy: Secondary | ICD-10-CM

## 2016-10-12 DIAGNOSIS — I10 Essential (primary) hypertension: Secondary | ICD-10-CM

## 2016-10-12 LAB — COMPREHENSIVE METABOLIC PANEL
ALBUMIN: 3.9 g/dL (ref 3.5–5.5)
ALK PHOS: 100 IU/L (ref 39–117)
ALT: 20 IU/L (ref 0–44)
AST: 22 IU/L (ref 0–40)
Albumin/Globulin Ratio: 1.2 (ref 1.2–2.2)
BILIRUBIN TOTAL: 0.4 mg/dL (ref 0.0–1.2)
BUN / CREAT RATIO: 9 (ref 9–20)
BUN: 10 mg/dL (ref 6–24)
CHLORIDE: 101 mmol/L (ref 96–106)
CO2: 21 mmol/L (ref 20–29)
Calcium: 9.3 mg/dL (ref 8.7–10.2)
Creatinine, Ser: 1.15 mg/dL (ref 0.76–1.27)
GFR calc Af Amer: 81 mL/min/{1.73_m2} (ref >59)
GFR calc non Af Amer: 70 mL/min/{1.73_m2} (ref >59)
GLOBULIN, TOTAL: 3.3 g/dL (ref 1.5–4.5)
GLUCOSE: 98 mg/dL (ref 65–99)
Potassium: 4.4 mmol/L (ref 3.5–5.2)
SODIUM: 138 mmol/L (ref 134–144)
Total Protein: 7.2 g/dL (ref 6.0–8.5)

## 2016-10-12 LAB — CBC WITH DIFFERENTIAL/PLATELET
BASOS ABS: 0 10*3/uL (ref 0.0–0.2)
Basos: 1 %
EOS (ABSOLUTE): 0.2 10*3/uL (ref 0.0–0.4)
Eos: 2 %
Hematocrit: 41.5 % (ref 37.5–51.0)
Hemoglobin: 14 g/dL (ref 13.0–17.7)
Immature Grans (Abs): 0 10*3/uL (ref 0.0–0.1)
Immature Granulocytes: 1 %
LYMPHS ABS: 2.4 10*3/uL (ref 0.7–3.1)
Lymphs: 28 %
MCH: 31.6 pg (ref 26.6–33.0)
MCHC: 33.7 g/dL (ref 31.5–35.7)
MCV: 94 fL (ref 79–97)
MONOS ABS: 0.9 10*3/uL (ref 0.1–0.9)
Monocytes: 10 %
NEUTROS ABS: 5.1 10*3/uL (ref 1.4–7.0)
Neutrophils: 58 %
PLATELETS: 253 10*3/uL (ref 150–379)
RBC: 4.43 x10E6/uL (ref 4.14–5.80)
RDW: 15.4 % (ref 12.3–15.4)
WBC: 8.6 10*3/uL (ref 3.4–10.8)

## 2016-10-12 LAB — TSH: TSH: 2.8 u[IU]/mL (ref 0.450–4.500)

## 2016-10-12 LAB — LIPID PANEL
CHOLESTEROL TOTAL: 194 mg/dL (ref 100–199)
Chol/HDL Ratio: 3 ratio (ref 0.0–5.0)
HDL: 65 mg/dL (ref >39)
LDL CALC: 108 mg/dL — AB (ref 0–99)
Triglycerides: 103 mg/dL (ref 0–149)
VLDL CHOLESTEROL CAL: 21 mg/dL (ref 5–40)

## 2016-10-12 MED ORDER — ATORVASTATIN CALCIUM 40 MG PO TABS: 40.0000 mg | ORAL_TABLET | Freq: Every day | ORAL | 3 refills | Status: DC

## 2016-10-12 NOTE — Telephone Encounter (Signed)
Lab results noted and ASCVD risk calculated to be near 17%.   Please call patient to let him know basic metabolic profile, blood counts and thyroid studies all normal. His cholesterol was elevated and given his other risk factors (smoker, high blood pressure) as well as his history of stroke I would recommend we re-start his cholesterol medication. I have sent Lipitor which he was taking in the past to his pharmacy. I am happy to talk to him if he has other questions.   Marcy Siren, D.O. 10/12/2016, 10:31 AM PGY-3, [facility]

## 2016-10-12 NOTE — Progress Notes (Signed)
Please send results letter to Dr. Archer Asa at [facility].   Marcy Siren, D.O. 10/12/2016, 10:29 AM PGY-3, [facility]

## 2016-10-15 NOTE — Telephone Encounter (Signed)
Pt wife informed of below information, states they will pick up Rx today. Lamonte Sakai, [place] D, [place]

## 2016-10-25 ENCOUNTER — Ambulatory Visit (INDEPENDENT_AMBULATORY_CARE_PROVIDER_SITE_OTHER): Payer: BLUE CROSS/BLUE SHIELD | Admitting: *Deleted

## 2016-10-25 VITALS — BP 138/90 | HR 71

## 2016-10-25 DIAGNOSIS — I10 Essential (primary) hypertension: Secondary | ICD-10-CM

## 2016-10-25 DIAGNOSIS — Z0130 Encounter for examination of blood pressure without abnormal findings: Secondary | ICD-10-CM

## 2016-10-25 NOTE — Progress Notes (Signed)
Based on Mr. Guardiola elevated BP at [employer] check, I would like him to follow up with me within the next month. Please call him and ask him to schedule an appointment. If he needs assistance with quitting smoking, scheduling an appointment with Dr. Raymondo BandKoval.   Marcy Sirenatherine Wallace, D.O. 10/25/2016, 4:21 PM PGY-3, [facility]

## 2016-10-25 NOTE — Progress Notes (Signed)
   Patient in nurse clinic with wife for blood pressure check. Patient denies chest pain, SOB, dizziness, visual changes, headache, n/v or numbness/tingling of arms/legs. Patient reported taking his medications every morning as prescribed. Patient's blood pressure was elevated today. Patient reported of smoking a cigarette just before his appointment. Advised patient that he should stop smoking which can contributes to increased blood pressure. Patient stated he has patches, advised patient to continue using the patches as prescribed.  If he feels the patches are not helping to follow up with PCP.  Advised patient to walk into clinic or go to ED if he start experiencing any of the symptoms listed.  Will forward to PCP.  Clovis PuMartin, Sholonda Jobst L, RN  Today's Vitals   10/25/16 1518 10/25/16 1529  BP: (!) 150/88 138/90  Pulse: 86 71  SpO2: 98%   PainSc: 0-No pain

## 2017-01-25 ENCOUNTER — Other Ambulatory Visit: Payer: Self-pay | Admitting: Internal Medicine

## 2017-01-25 MED ORDER — OMEGA-3-ACID ETHYL ESTERS 1 G PO CAPS: ORAL_CAPSULE | ORAL | 11 refills | Status: DC

## 2017-01-25 MED ORDER — FOLIC ACID 1 MG PO TABS: ORAL_TABLET | ORAL | 11 refills | Status: DC

## 2017-05-04 ENCOUNTER — Other Ambulatory Visit: Payer: Self-pay | Admitting: Internal Medicine

## 2017-05-07 NOTE — Telephone Encounter (Signed)
Attempted to call, no answer and VM not set up.  Will await callback but will also mail a latter. [name] [name], Maryjo RochesterJessica Dawn, CMA

## 2017-05-07 NOTE — Telephone Encounter (Signed)
One month supply of medications refilled. Patient needs to have an appointment for further refills as I wanted him to follow up for BP several months ago.   Marcy Sirenatherine Marqual Mi, D.O. 05/07/2017, 9:23 AM PGY-3, [facility]

## 2017-05-08 ENCOUNTER — Other Ambulatory Visit: Payer: Self-pay | Admitting: Internal Medicine

## 2017-05-08 DIAGNOSIS — F17200 Nicotine dependence, unspecified, uncomplicated: Secondary | ICD-10-CM

## 2017-05-25 ENCOUNTER — Encounter (HOSPITAL_COMMUNITY): Payer: Self-pay | Admitting: Emergency Medicine

## 2017-05-25 ENCOUNTER — Emergency Department (HOSPITAL_COMMUNITY)
Admission: EM | Admit: 2017-05-25 | Discharge: 2017-05-25 | Disposition: A | Discharge status: Home / Self Care | Payer: BLUE CROSS/BLUE SHIELD | Attending: Emergency Medicine | Admitting: Emergency Medicine

## 2017-05-25 ENCOUNTER — Other Ambulatory Visit: Payer: Self-pay

## 2017-05-25 ENCOUNTER — Emergency Department (HOSPITAL_COMMUNITY): Payer: BLUE CROSS/BLUE SHIELD

## 2017-05-25 DIAGNOSIS — Y999 Unspecified external cause status: Secondary | ICD-10-CM | POA: Insufficient documentation

## 2017-05-25 DIAGNOSIS — I615 Nontraumatic intracerebral hemorrhage, intraventricular: Secondary | ICD-10-CM

## 2017-05-25 DIAGNOSIS — I10 Essential (primary) hypertension: Secondary | ICD-10-CM | POA: Diagnosis not present

## 2017-05-25 DIAGNOSIS — Y92009 Unspecified place in unspecified non-institutional (private) residence as the place of occurrence of the external cause: Secondary | ICD-10-CM

## 2017-05-25 DIAGNOSIS — S0232XA Fracture of orbital floor, left side, initial encounter for closed fracture: Secondary | ICD-10-CM

## 2017-05-25 DIAGNOSIS — F17210 Nicotine dependence, cigarettes, uncomplicated: Secondary | ICD-10-CM | POA: Diagnosis not present

## 2017-05-25 DIAGNOSIS — Z7982 Long term (current) use of aspirin: Secondary | ICD-10-CM | POA: Insufficient documentation

## 2017-05-25 DIAGNOSIS — Y939 Activity, unspecified: Secondary | ICD-10-CM | POA: Insufficient documentation

## 2017-05-25 DIAGNOSIS — Z23 Encounter for immunization: Secondary | ICD-10-CM | POA: Insufficient documentation

## 2017-05-25 DIAGNOSIS — W19XXXA Unspecified fall, initial encounter: Secondary | ICD-10-CM | POA: Insufficient documentation

## 2017-05-25 DIAGNOSIS — S0230XA Fracture of orbital floor, unspecified side, initial encounter for closed fracture: Secondary | ICD-10-CM

## 2017-05-25 DIAGNOSIS — E119 Type 2 diabetes mellitus without complications: Secondary | ICD-10-CM | POA: Insufficient documentation

## 2017-05-25 DIAGNOSIS — S0990XA Unspecified injury of head, initial encounter: Secondary | ICD-10-CM | POA: Diagnosis present

## 2017-05-25 DIAGNOSIS — Z79899 Other long term (current) drug therapy: Secondary | ICD-10-CM | POA: Insufficient documentation

## 2017-05-25 HISTORY — DX: Fracture of orbital floor, unspecified side, initial encounter for closed fracture: S02.30XA

## 2017-05-25 LAB — BASIC METABOLIC PANEL
Anion gap: 11 (ref 5–15)
BUN: 12 mg/dL (ref 6–20)
CHLORIDE: 102 mmol/L (ref 101–111)
CO2: 22 mmol/L (ref 22–32)
CREATININE: 1.1 mg/dL (ref 0.61–1.24)
Calcium: 9.2 mg/dL (ref 8.9–10.3)
GFR calc Af Amer: >60 mL/min (ref >60)
GFR calc non Af Amer: >60 mL/min (ref >60)
Glucose, Bld: 111 mg/dL — ABNORMAL HIGH (ref 65–99)
POTASSIUM: 3.9 mmol/L (ref 3.5–5.1)
Sodium: 135 mmol/L (ref 135–145)

## 2017-05-25 LAB — CBC WITH DIFFERENTIAL/PLATELET
Basophils Absolute: 0 10*3/uL (ref 0.0–0.1)
Basophils Relative: 1 %
Eosinophils Absolute: 0.2 10*3/uL (ref 0.0–0.7)
Eosinophils Relative: 2 %
HEMATOCRIT: 40.9 % (ref 39.0–52.0)
HEMOGLOBIN: 13.4 g/dL (ref 13.0–17.0)
LYMPHS PCT: 25 %
Lymphs Abs: 1.6 10*3/uL (ref 0.7–4.0)
MCH: 32.1 pg (ref 26.0–34.0)
MCHC: 32.8 g/dL (ref 30.0–36.0)
MCV: 98.1 fL (ref 78.0–100.0)
MONOS PCT: 9 %
Monocytes Absolute: 0.6 10*3/uL (ref 0.1–1.0)
NEUTROS PCT: 63 %
Neutro Abs: 3.9 10*3/uL (ref 1.7–7.7)
Platelets: 232 10*3/uL (ref 150–400)
RBC: 4.17 MIL/uL — ABNORMAL LOW (ref 4.22–5.81)
RDW: 14.1 % (ref 11.5–15.5)
WBC: 6.3 10*3/uL (ref 4.0–10.5)

## 2017-05-25 LAB — HEPATIC FUNCTION PANEL
ALT: 39 U/L (ref 17–63)
AST: 32 U/L (ref 15–41)
Albumin: 3.4 g/dL — ABNORMAL LOW (ref 3.5–5.0)
Alkaline Phosphatase: 92 U/L (ref 38–126)
BILIRUBIN DIRECT: 0.2 mg/dL (ref 0.1–0.5)
BILIRUBIN TOTAL: 1.3 mg/dL — AB (ref 0.3–1.2)
Indirect Bilirubin: 1.1 mg/dL — ABNORMAL HIGH (ref 0.3–0.9)
Total Protein: 7.5 g/dL (ref 6.5–8.1)

## 2017-05-25 LAB — AMMONIA

## 2017-05-25 LAB — ETHANOL: Alcohol, Ethyl (B): <10 mg/dL (ref <10)

## 2017-05-25 MED ORDER — TETRACAINE HCL 0.5 % OP SOLN
2.0000 [drp] | Freq: Once | OPHTHALMIC | Status: AC
Start: 2017-05-25 — End: 2017-05-25
  Administered 2017-05-25: 2 [drp] via OPHTHALMIC
  Filled 2017-05-25: qty 4

## 2017-05-25 MED ORDER — FLUORESCEIN SODIUM 1 MG OP STRP
1.0000 | ORAL_STRIP | Freq: Once | OPHTHALMIC | Status: AC
  Administered 2017-05-25: 1 via OPHTHALMIC
  Filled 2017-05-25: qty 1

## 2017-05-25 MED ORDER — TETANUS-DIPHTH-ACELL PERTUSSIS 5-2.5-18.5 LF-MCG/0.5 IM SUSP
0.5000 mL | Freq: Once | INTRAMUSCULAR | Status: AC
  Administered 2017-05-25: 0.5 mL via INTRAMUSCULAR
  Filled 2017-05-25: qty 0.5

## 2017-05-25 MED ORDER — ERYTHROMYCIN 5 MG/GM OP OINT
1.0000 "application " | TOPICAL_OINTMENT | Freq: Three times a day (TID) | OPHTHALMIC | Status: DC
  Administered 2017-05-25 (×2): 1 via OPHTHALMIC
  Filled 2017-05-25: qty 3.5

## 2017-05-25 NOTE — ED Triage Notes (Signed)
Pt to ER for evaluation accompanied by wife who reports patient had ETOH on board Thursday and had an unwitnessed fall. Wife reports patient has significant hx for stroke. Pt reports he does not remember the fall. Left orbit with significant swelling and purulent drainage. Pt is a/o x4 at this time. PA Bowie recommending imaging and labs + visual acuity, orders placed accordingly.

## 2017-05-25 NOTE — ED Provider Notes (Signed)
[facility] EMERGENCY DEPARTMENT Provider Note   CSN: [id number] Arrival date & time: 05/25/17  7846     History   Chief Complaint Chief Complaint  Patient presents with  . Fall  . Eye Injury    HPI [name] [name] is a 57 y.o. male.  HPI   57 year old male with history of bipolar, diabetes, alcohol abuse, hepatic encephalopathy brought here to the ED accompanied by family member for evaluation of a fall.  History obtained through wife and daughter at bedside.  Patient with history of recurrent falls as well as alcohol abuse.  3 days ago he had an unwitnessed fall at home wife found him on the ground on his back.  He did not recall what had happened.  He does admit to drinking alcohol on the day.  Since then she noticed bruising and swelling involving his left eyelid.  His daughter went to visit him recently and was concerned of the situation prompting patient to come for further evaluation.  At this time, patient complaining of mild sharp pain to his left side of face no other complaint.  He denies having headache, nausea, vomiting, vision changes, increased confusion, chest pain, trouble breathing, abdominal pain, back pain, or pain to his extremities.  He is unable to recall his last tetanus shot.  He is a poor historian.  He is not on the blood thinner medication.  Past Medical History:  Diagnosis Date  . Alcohol abuse   . Bipolar disorder (HCC)   . Diabetes mellitus   . Gastric ulcer   . Hepatic encephalopathy (HCC)   . Hyperkalemia 12/08/2014  . Hypertension     Patient Active Problem List   Diagnosis Date Noted  . Chronic pain of right knee 02/13/2016  . Spasm of muscle   . Left leg weakness 07/27/2015  . History of stroke   . Absence of bladder continence   . Fall   . Essential hypertension   . Bipolar 1 disorder, mixed, moderate (HCC)   . AKI (acute kidney injury) (HCC)   . Gastrointestinal hemorrhage associated with duodenal ulcer   .  Hyperkalemia 12/08/2014  . Tinea corporis 12/25/2011  . Hepatic encephalopathy (HCC) 09/03/2010  . Hepatic steatosis 06/09/2010  . Bipolar disorder (HCC) 05/14/2010  . Hepatomegaly 05/12/2010  . ANEMIA, FOLIC ACID DEFICIENCY 08/02/2009  . Abnormality of gait 07/28/2009  . Alcoholism (HCC) 07/11/2009  . PROTEINURIA 08/07/2006  . HYPERTRIGLYCERIDEMIA 04/25/2006  . GOUT, CHRONIC 04/25/2006  . OBESITY, NOS 04/25/2006  . DEPRESSION, MAJOR, RECURRENT 04/25/2006  . TOBACCO DEPENDENCE 04/25/2006  . HYPERTENSION, BENIGN SYSTEMIC 04/25/2006  . Peptic ulcer 04/25/2006  . IMPOTENCE, ORGANIC 04/25/2006    Past Surgical History:  Procedure Laterality Date  . ESOPHAGOGASTRODUODENOSCOPY (EGD) WITH PROPOFOL Left 12/09/2014   Procedure: ESOPHAGOGASTRODUODENOSCOPY (EGD) WITH PROPOFOL;  Surgeon: Bernette Redbird, MD;  Location: [facility] ENDOSCOPY;  Service: Endoscopy;  Laterality: Left;        Home Medications    Prior to Admission medications   Medication Sig Start Date End Date Taking? Authorizing Provider  allopurinol (ZYLOPRIM) 300 MG tablet TAKE 1 TABLET BY MOUTH EVERY DAY 05/07/17   Arvilla Market, DO  amLODipine (NORVASC) 10 MG tablet Take 1 tablet (10 mg total) by mouth daily. 10/11/16   Arvilla Market, DO  aspirin 81 MG chewable tablet Chew 1 tablet (81 mg total) by mouth daily. 05/12/10   Floydene Flock, MD  atorvastatin (LIPITOR) 40 MG tablet Take 1 tablet (40 mg  total) by mouth daily. 10/12/16   Arvilla MarketWallace, Catherine Lauren, DO  FLUoxetine (PROZAC) 40 MG capsule Take 40 mg by mouth at bedtime. 03/21/15   [provider]  folic acid (FOLVITE) 1 MG tablet TAKE 1 TABLET (1 MG TOTAL) BY MOUTH DAILY. 01/25/17   Mayo, Allyn KennerKaty Dodd, MD  lithium (ESKALITH) 450 MG CR tablet Take 1 tablet (450 mg total) by mouth daily. For mental health 05/12/10   Floydene FlockNewton, Steven J, MD  metoprolol tartrate (LOPRESSOR) 100 MG tablet TAKE 1 TABLET BY MOUTH TWICE A DAY 05/07/17   Arvilla MarketWallace, Catherine  Lauren, DO  NICOTINE STEP 1 21 MG/24HR patch PLACE 1 PATCH ONTO THE SKIN DAILY. 05/09/17   Arvilla MarketWallace, Catherine Lauren, DO  omega-3 acid ethyl esters (LOVAZA) 1 g capsule TAKE 1 CAPSULE (1 G TOTAL) BY MOUTH DAILY. 01/25/17   Mayo, Allyn KennerKaty Dodd, MD  pantoprazole (PROTONIX) 40 MG tablet TAKE 1 TABLET BY MOUTH EVERY DAY 05/07/17   Arvilla MarketWallace, Catherine Lauren, DO    Family History History reviewed. No pertinent family history.  Social History Social History   Tobacco Use  . Smoking status: Current Every Day Smoker    Packs/day: 0.50    Years: 30.00    Pack years: 15.00    Types: Cigarettes  . Smokeless tobacco: Never Used  . Tobacco comment: does not want to quitt right now  Substance Use Topics  . Alcohol use: Yes    Alcohol/week: 8.4 oz    Types: 14 Cans of beer per week    Comment: " every friday I get a 5th "  . Drug use: No     Allergies   Ace inhibitors and Penicillins   Review of Systems Review of Systems  All other systems reviewed and are negative.    Physical Exam Updated Vital Signs BP (!) 137/97 (BP Location: Left Arm)   Pulse 66   Temp 97.8 F (36.6 C) (Oral)   Resp 20   Ht 6' (1.829 m)   Wt 113.4 kg (250 lb)   SpO2 99%   BMI 33.91 kg/m    Physical Exam  Constitutional: He appears well-developed and well-nourished. No distress.  HENT:  Left upper and lower eyelid is moderately edematous, with obvious hematoma, and eyelids are crusted over.  Faint hematoma to left lower cheek.  No hemotympanum, no septal hematoma, no malocclusion.  Eyes: Pupils are equal, round, and reactive to light. EOM are normal. Left eye exhibits chemosis. Left conjunctiva has a hemorrhage.  Pupils are 2+ and reactive bilaterally.   L eye: Subconjunctival hemorrhage of left conjunctiva involving the majority of the conjunctiva. No hyphema. No fluoreince uptake, no corneal abrasion and IOP is 20  Neck: Neck supple.  Neurological: He is alert.  Skin: No rash noted.  Psychiatric: He has  a normal mood and affect.  Nursing note and vitals reviewed.    ED Treatments / Results  Labs (all labs ordered are listed, but only abnormal results are displayed) Labs Reviewed  CBC WITH DIFFERENTIAL/PLATELET - Abnormal; Notable for the following components:      Result Value   RBC 4.17 (*)    All other components within normal limits  BASIC METABOLIC PANEL - Abnormal; Notable for the following components:   Glucose, Bld 111 (*)    All other components within normal limits  ETHANOL  AMMONIA  HEPATIC FUNCTION PANEL    EKG None  Radiology Ct Head Wo Contrast  Result Date: 05/25/2017 CLINICAL DATA:  Fall yesterday. Left orbital  injury. Initial encounter. EXAM: CT HEAD WITHOUT CONTRAST CT MAXILLOFACIAL WITHOUT CONTRAST CT CERVICAL SPINE WITHOUT CONTRAST TECHNIQUE: Multidetector CT imaging of the head, cervical spine, and maxillofacial structures were performed using the standard protocol without intravenous contrast. Multiplanar CT image reconstructions of the cervical spine and maxillofacial structures were also generated. COMPARISON:  Head CT 07/26/2015 FINDINGS: CT HEAD FINDINGS Brain: 4 mm rounded focus of high density in the temporal horn of the right lateral ventricle, new from 2017 MRI and head CT. Although somewhat usual in its location and rounded shape this is most consistent with a small focus of intraventricular hemorrhage in the setting of trauma. Anticipate follow-up. There is encephalomalacia in the bilateral inferior frontal lobes consistent with old contusions. And there is a band of encephalomalacia and wallerian changes seen across the corpus callosum body likely posttraumatic due to impaction on the falx given the frontal lobe findings. Age advanced atrophy and chronic microvascular ischemic type change in the cerebral white matter. No evidence of acute infarct, hydrocephalus, or collection. Vascular: Atherosclerotic calcification Skull: Negative for skull fracture. CT  MAXILLOFACIAL FINDINGS Osseous: Blowout fracture of the inferior wall left orbit with depressed floor fragment and herniated orbital fat. Posteriorly the inferior rectus is also partially herniated. No additional fractures for zygomaticomaxillary complex injury. Tiny fragment along the right nasal bone is chronic based on prior. Orbits: Postseptal and preseptal gas attributed to the sinus fracture. Negative for hematoma. Moderate left proptosis. Sinuses: Hemosinus in the left maxilla. There is underlying changes of chronic left maxillary sinusitis. Soft tissues: Contusion over the left face. CT CERVICAL SPINE FINDINGS Alignment: No traumatic malalignment. Skull base and vertebrae: Negative for fracture Soft tissues and spinal canal: No prevertebral fluid or swelling. No visible canal hematoma. Disc levels:  No degenerative impingement of the cord. Upper chest: Negative Critical Value/emergent results were called by telephone at the time of interpretation on 05/25/2017 at 10:44 am to Dr. Fayrene Helper , who verbally acknowledged these results. IMPRESSION: 1. Minimal hemorrhage in the temporal horn of the right lateral ventricle. 2. Blowout fracture of the left orbital floor with herniated orbital fat and inferior rectus. Related orbital and preseptal soft tissue emphysema with left proptosis. 3. Negative for cervical spine fracture. 4. Age advanced atrophy and chronic small vessel disease. Remote inferior frontal contusions. Electronically Signed   By: Marnee Spring M.D.   On: 05/25/2017 10:47   Ct Cervical Spine Wo Contrast  Result Date: 05/25/2017 CLINICAL DATA:  Fall yesterday. Left orbital injury. Initial encounter. EXAM: CT HEAD WITHOUT CONTRAST CT MAXILLOFACIAL WITHOUT CONTRAST CT CERVICAL SPINE WITHOUT CONTRAST TECHNIQUE: Multidetector CT imaging of the head, cervical spine, and maxillofacial structures were performed using the standard protocol without intravenous contrast. Multiplanar CT image  reconstructions of the cervical spine and maxillofacial structures were also generated. COMPARISON:  Head CT 07/26/2015 FINDINGS: CT HEAD FINDINGS Brain: 4 mm rounded focus of high density in the temporal horn of the right lateral ventricle, new from 2017 MRI and head CT. Although somewhat usual in its location and rounded shape this is most consistent with a small focus of intraventricular hemorrhage in the setting of trauma. Anticipate follow-up. There is encephalomalacia in the bilateral inferior frontal lobes consistent with old contusions. And there is a band of encephalomalacia and wallerian changes seen across the corpus callosum body likely posttraumatic due to impaction on the falx given the frontal lobe findings. Age advanced atrophy and chronic microvascular ischemic type change in the cerebral white matter. No evidence of acute  infarct, hydrocephalus, or collection. Vascular: Atherosclerotic calcification Skull: Negative for skull fracture. CT MAXILLOFACIAL FINDINGS Osseous: Blowout fracture of the inferior wall left orbit with depressed floor fragment and herniated orbital fat. Posteriorly the inferior rectus is also partially herniated. No additional fractures for zygomaticomaxillary complex injury. Tiny fragment along the right nasal bone is chronic based on prior. Orbits: Postseptal and preseptal gas attributed to the sinus fracture. Negative for hematoma. Moderate left proptosis. Sinuses: Hemosinus in the left maxilla. There is underlying changes of chronic left maxillary sinusitis. Soft tissues: Contusion over the left face. CT CERVICAL SPINE FINDINGS Alignment: No traumatic malalignment. Skull base and vertebrae: Negative for fracture Soft tissues and spinal canal: No prevertebral fluid or swelling. No visible canal hematoma. Disc levels:  No degenerative impingement of the cord. Upper chest: Negative Critical Value/emergent results were called by telephone at the time of interpretation on  05/25/2017 at 10:44 am to Dr. Fayrene Helper , who verbally acknowledged these results. IMPRESSION: 1. Minimal hemorrhage in the temporal horn of the right lateral ventricle. 2. Blowout fracture of the left orbital floor with herniated orbital fat and inferior rectus. Related orbital and preseptal soft tissue emphysema with left proptosis. 3. Negative for cervical spine fracture. 4. Age advanced atrophy and chronic small vessel disease. Remote inferior frontal contusions. Electronically Signed   By: Marnee Spring M.D.   On: 05/25/2017 10:47   Ct Maxillofacial Wo Contrast  Result Date: 05/25/2017 CLINICAL DATA:  Fall yesterday. Left orbital injury. Initial encounter. EXAM: CT HEAD WITHOUT CONTRAST CT MAXILLOFACIAL WITHOUT CONTRAST CT CERVICAL SPINE WITHOUT CONTRAST TECHNIQUE: Multidetector CT imaging of the head, cervical spine, and maxillofacial structures were performed using the standard protocol without intravenous contrast. Multiplanar CT image reconstructions of the cervical spine and maxillofacial structures were also generated. COMPARISON:  Head CT 07/26/2015 FINDINGS: CT HEAD FINDINGS Brain: 4 mm rounded focus of high density in the temporal horn of the right lateral ventricle, new from 2017 MRI and head CT. Although somewhat usual in its location and rounded shape this is most consistent with a small focus of intraventricular hemorrhage in the setting of trauma. Anticipate follow-up. There is encephalomalacia in the bilateral inferior frontal lobes consistent with old contusions. And there is a band of encephalomalacia and wallerian changes seen across the corpus callosum body likely posttraumatic due to impaction on the falx given the frontal lobe findings. Age advanced atrophy and chronic microvascular ischemic type change in the cerebral white matter. No evidence of acute infarct, hydrocephalus, or collection. Vascular: Atherosclerotic calcification Skull: Negative for skull fracture. CT MAXILLOFACIAL  FINDINGS Osseous: Blowout fracture of the inferior wall left orbit with depressed floor fragment and herniated orbital fat. Posteriorly the inferior rectus is also partially herniated. No additional fractures for zygomaticomaxillary complex injury. Tiny fragment along the right nasal bone is chronic based on prior. Orbits: Postseptal and preseptal gas attributed to the sinus fracture. Negative for hematoma. Moderate left proptosis. Sinuses: Hemosinus in the left maxilla. There is underlying changes of chronic left maxillary sinusitis. Soft tissues: Contusion over the left face. CT CERVICAL SPINE FINDINGS Alignment: No traumatic malalignment. Skull base and vertebrae: Negative for fracture Soft tissues and spinal canal: No prevertebral fluid or swelling. No visible canal hematoma. Disc levels:  No degenerative impingement of the cord. Upper chest: Negative Critical Value/emergent results were called by telephone at the time of interpretation on 05/25/2017 at 10:44 am to Dr. Fayrene Helper , who verbally acknowledged these results. IMPRESSION: 1. Minimal hemorrhage in the temporal horn  of the right lateral ventricle. 2. Blowout fracture of the left orbital floor with herniated orbital fat and inferior rectus. Related orbital and preseptal soft tissue emphysema with left proptosis. 3. Negative for cervical spine fracture. 4. Age advanced atrophy and chronic small vessel disease. Remote inferior frontal contusions. Electronically Signed   By: Marnee Spring M.D.   On: 05/25/2017 10:47    Procedures Procedures (including critical care time)  Medications Ordered in ED Medications - No data to display   Initial Impression / Assessment and Plan / ED Course  I have reviewed the triage vital signs and the nursing notes.  Pertinent labs & imaging results that were available during my care of the patient were reviewed by me and considered in my medical decision making (see chart for details).     BP 138/88   Pulse  (!) 58   Temp 97.8 F (36.6 C) (Oral)   Resp 16   Ht 6' (1.829 m)   Wt 113.4 kg (250 lb)   SpO2 96%   BMI 33.91 kg/m     Final Clinical Impressions(s) / ED Diagnoses   Final diagnoses:  Hemorrhage into ventricle [facility])  Closed blow-out fracture of left orbit, initial encounter (HCC)  Fall at home, initial encounter    ED Discharge Orders    None     11:50 AM Patient with unwitnessed fall 3 days ago.  History of alcohol abuse.  He does have significant injury involving his left orbital region.  Maxillofacial CT scan and head CT scan demonstrate minimal hemorrhage in the temporal horn of the right lateral ventricle.  This information was discussed with on-call neurosurgeon, Dr. Lovell Sheehan, who have reviewed the CT scan and does not think patient need any additional management on a neurosurgical standpoint as the bleeding is very minimal.  Patient also has blowout fracture of the left orbital floor with herniated orbital fat and inferior rectus.  Related orbital and preseptal soft tissue emphysema with left proptosis.  Patient's vision is intact, no complaints of pain with eye movement.  Does have significant sub-conjunctival hemorrhage as well as ecchymosis to the orbital region.  No hyphema noted.  Normal intraocular pressure.  Plan to consult maxillofacial surgeon for further management of his blowout fracture.  Will update tetanus. Care discussed with Dr. Dalene Seltzer.   1:06 PM I have consulted with maxillofacial specialist Dr. Jearld Fenton in regard to the orbital blow out fracture.  He recommend outpt f/u in 5 days for recheck.  1:38 PM Patient and family members were made aware of plan.  Encourage patient to avoid blowing his nose, and to sleep upright to decrease the risk of complication from his blowout fracture.  Recommend cool compress several times daily to help with the swelling.  Erythromycin ointment prescribed for potential infection.  Encourage alcohol cessation and I will also  include a list of resources for outpatient management.  His labs otherwise reassuringAnd at this time he will be discharged home with his family members.  Fayrene Helper, PA-C 05/25/17 1345  Alvira Monday, MD 05/27/17 [phone]

## 2017-05-25 NOTE — Discharge Instructions (Addendum)
You have been evaluated for your recent fall.  There's a small amount of blood in your brain from the fall.  Avoid alcohol use to decrease risk of falling.  You have several broken bones around your left eye socket.  Apply cool compress to affected area several times daily to decrease swelling.  Avoid blowing your nose. Apply a small bead of erythromycin ointment every 6-8 hrs while awake for the next 5 days to fight eye infection. Sleep in a recliner to help decrease swelling.  Patient avoid alcohol use.  Use resources below for outpatient support.  He would need to follow-up with facial specialist, Dr. Jearld FentonByers, in 5 days so please call and make an appointment.  Return if you have any concern.

## 2017-05-28 ENCOUNTER — Other Ambulatory Visit: Payer: Self-pay

## 2017-05-28 ENCOUNTER — Encounter: Payer: Self-pay | Admitting: Internal Medicine

## 2017-05-28 ENCOUNTER — Ambulatory Visit: Payer: BLUE CROSS/BLUE SHIELD | Admitting: Internal Medicine

## 2017-05-28 VITALS — BP 108/76 | HR 63 | Temp 98.0°F | Ht 72.0 in | Wt 244.0 lb

## 2017-05-28 DIAGNOSIS — Z0000 Encounter for general adult medical examination without abnormal findings: Secondary | ICD-10-CM | POA: Diagnosis not present

## 2017-05-28 DIAGNOSIS — I10 Essential (primary) hypertension: Secondary | ICD-10-CM | POA: Diagnosis not present

## 2017-05-28 DIAGNOSIS — Z79899 Other long term (current) drug therapy: Secondary | ICD-10-CM

## 2017-05-28 MED ORDER — ALLOPURINOL 300 MG PO TABS: 300.0000 mg | ORAL_TABLET | Freq: Every day | ORAL | 11 refills | Status: DC

## 2017-05-28 MED ORDER — ATORVASTATIN CALCIUM 40 MG PO TABS: 40.0000 mg | ORAL_TABLET | Freq: Every day | ORAL | 3 refills | Status: DC

## 2017-05-28 MED ORDER — AMLODIPINE BESYLATE 10 MG PO TABS: 10.0000 mg | ORAL_TABLET | Freq: Every day | ORAL | 3 refills | Status: DC

## 2017-05-28 MED ORDER — METOPROLOL TARTRATE 100 MG PO TABS: 100.0000 mg | ORAL_TABLET | Freq: Two times a day (BID) | ORAL | 0 refills | Status: DC

## 2017-05-28 NOTE — Progress Notes (Signed)
Subjective:    [name] [name] - 57 y.o. male MRN [id number]  Date of birth: 04/02/1960  HPI  [name] [name] is here for follow up of chronic medical conditions. Had recent unwitnessed fall for which he was seen in ED.  Maxillofacial CT scan and head CT scan demonstrate minimal hemorrhage in the temporal horn of the right lateral ventricle. Patient also has blowout fracture of the left orbital floor with herniated orbital fat and inferior rectus. Has follow up with Dr. Jearld FentonByers tomorrow.    Chronic HTN Disease Monitoring:  Home BP Monitoring - No Chest pain- no  Dyspnea- no Headache - no  Medications: Metoprolol 100 mg BID and Amlodipine 10 mg daily   Compliance- yes Lightheadedness- no  Edema- no   Medication Management:  Patient taking Lithium. Psychiatrist Dr. Bynum BellowsPolvksy requests lab results for lithium level, TSH, and CMET be sent to his office.    Health Maintenance Due  Topic Date Due  . COLONOSCOPY  09/02/2008    -  reports that he has been smoking cigarettes.  He has a 15.00 pack-year smoking history. He has never used smokeless tobacco. - Review of Systems: Per HPI. - Past Medical History: Patient Active Problem List   Diagnosis Date Noted  . Chronic pain of right knee 02/13/2016  . Spasm of muscle   . Left leg weakness 07/27/2015  . History of stroke   . Absence of bladder continence   . Fall   . Essential hypertension   . Bipolar 1 disorder, mixed, moderate (HCC)   . AKI (acute kidney injury) (HCC)   . Gastrointestinal hemorrhage associated with duodenal ulcer   . Hyperkalemia 12/08/2014  . Tinea corporis 12/25/2011  . Hepatic encephalopathy (HCC) 09/03/2010  . Hepatic steatosis 06/09/2010  . Bipolar disorder (HCC) 05/14/2010  . Hepatomegaly 05/12/2010  . ANEMIA, FOLIC ACID DEFICIENCY 08/02/2009  . Abnormality of gait 07/28/2009  . Alcoholism (HCC) 07/11/2009  . PROTEINURIA 08/07/2006  . HYPERTRIGLYCERIDEMIA 04/25/2006  . GOUT, CHRONIC  04/25/2006  . OBESITY, NOS 04/25/2006  . DEPRESSION, MAJOR, RECURRENT 04/25/2006  . TOBACCO DEPENDENCE 04/25/2006  . HYPERTENSION, BENIGN SYSTEMIC 04/25/2006  . Peptic ulcer 04/25/2006  . IMPOTENCE, ORGANIC 04/25/2006   - Medications: reviewed and updated   Objective:   Physical Exam BP 108/76   Pulse 63   Temp 98 F (36.7 C) (Oral)   Ht 6' (1.829 m)   Wt 244 lb (110.7 kg)   SpO2 99%   BMI 33.09 kg/m  Gen: NAD, alert, cooperative with exam, well-appearing HEENT: NCAT, PERRL, left conjunctiva with a hemorrhage, left upper and lower eyelid has mild edema with yellow ecchymosis, left cheek with significant bruising  CV: RRR, good S1/S2, no murmur, no edema, capillary refill brisk  Resp: CTABL, no wheezes, non-labored Abd: SNTND, BS present, no guarding or organomegaly Skin: no rashes, normal turgor  Neuro: no gross deficits.  Psych: good insight, alert and oriented    Assessment & Plan:   1. High risk medication use Patient taking Lithium. Will fax results to [facility].  - Comprehensive metabolic panel - Lithium level - TSH  2. Essential hypertension Well controlled today. Continue current therapy.  - amLODipine (NORVASC) 10 MG tablet; Take 1 tablet (10 mg total) by mouth daily.  Dispense: 90 tablet; Refill: 3 - metoprolol tartrate (LOPRESSOR) 100 MG tablet; Take 1 tablet (100 mg total) by mouth 2 (two) times daily.  Dispense: 60 tablet; Refill: 0  3. On statin therapy due to risk  of future cardiovascular event - atorvastatin (LIPITOR) 40 MG tablet; Take 1 tablet (40 mg total) by mouth daily.  Dispense: 90 tablet; Refill: 3  4. Healthcare maintenance -discussed need for colonoscopy for colon cancer screening; handout given    Marcy Siren, D.O. 06/04/2017, 9:55 AM PGY-3, [facility]

## 2017-05-28 NOTE — Patient Instructions (Addendum)
Please follow up with Dr. Byers tomorrowJearld [name] at your scheduled visit. It is good that you are staying away from alcohol.  I have refilled your medications. Your blood pressure looks great today.   We will check some labs and fax the results to Dr. Talbert ForestPolvsky at [facility].   I would recommend a colonoscopy to screen for colon cancer.

## 2017-05-29 LAB — TSH: TSH: 1.78 u[IU]/mL (ref 0.450–4.500)

## 2017-05-29 LAB — COMPREHENSIVE METABOLIC PANEL
ALBUMIN: 4.3 g/dL (ref 3.5–5.5)
ALK PHOS: 98 IU/L (ref 39–117)
ALT: 38 IU/L (ref 0–44)
AST: 31 IU/L (ref 0–40)
Albumin/Globulin Ratio: 1.3 (ref 1.2–2.2)
BUN/Creatinine Ratio: 9 (ref 9–20)
BUN: 10 mg/dL (ref 6–24)
Bilirubin Total: 0.5 mg/dL (ref 0.0–1.2)
CO2: 22 mmol/L (ref 20–29)
CREATININE: 1.15 mg/dL (ref 0.76–1.27)
Calcium: 9.8 mg/dL (ref 8.7–10.2)
Chloride: 104 mmol/L (ref 96–106)
GFR calc Af Amer: 81 mL/min/{1.73_m2} (ref >59)
GFR calc non Af Amer: 70 mL/min/{1.73_m2} (ref >59)
GLUCOSE: 109 mg/dL — AB (ref 65–99)
Globulin, Total: 3.3 g/dL (ref 1.5–4.5)
Potassium: 4.3 mmol/L (ref 3.5–5.2)
Sodium: 140 mmol/L (ref 134–144)
Total Protein: 7.6 g/dL (ref 6.0–8.5)

## 2017-05-29 LAB — LITHIUM LEVEL

## 2017-06-04 ENCOUNTER — Encounter: Payer: Self-pay | Admitting: Internal Medicine

## 2017-06-04 NOTE — Progress Notes (Signed)
Please send letter with lab results to Dr. Talbert ForestPolvsky at [facility] via fax number [phone].   Marcy Sirenatherine Mehr Depaoli, D.O. 06/04/2017, 9:53 AM PGY-3, [facility]

## 2017-06-06 ENCOUNTER — Other Ambulatory Visit: Payer: Self-pay | Admitting: Internal Medicine

## 2017-07-29 NOTE — Progress Notes (Signed)
Subjective:   Patient ID: [name] [name]    DOB: 05/11/1960, 57 y.o. male   MRN: [id number]  [name] [name] is a 57 y.o. male with a history of HTN, gout, obesity, chronic R knee pain here for   L knee pain Patient presents with L knee pain for a few months. Reports falling earlier this month but states it was hurting previous to fall. Describes pain as aching 8-10/10 pain located in joint. Denies fevers, swelling, redness. He feels the pain the most when he walks. Has tried advil off and on which helps a little. Previously seen in 2017 for R knee pain, received steroid injection which helped a lot and is requesting injection today for L knee.  Review of Systems:  Per HPI.   PMFSH: reviewed. Smoking status reviewed. Medications reviewed.  Objective:   BP 118/74   Pulse (!) 56   Temp 98.1 F (36.7 C) (Oral)   Wt 242 lb (109.8 kg)   SpO2 96%   BMI 32.82 kg/m  Vitals and nursing note reviewed.  General: obese male, in no acute distress with non-toxic appearance CV: regular rate and rhythm without murmurs, rubs, or gallops Lungs: clear to auscultation bilaterally with normal work of breathing Extremities: warm and well perfused. No effusions, redness, or swelling of knee. MSK: ROM grossly intact. L knee with negative Lachman's/posterior drawer. Minimal crepitus on exam. No laxity appreciated on exam. Some pain with medial stress to L knee. Antalgic gait. Ambulates with a cane at baseline. Strength 4/5 L leg, 5/5 R leg. Neuro: Alert and oriented, speech normal  Assessment & Plan:   Chronic pain of left knee Likely has some contributing arthritis given improvement with intermittent NSAID use and previous relief of R knee with steroid injection. However with no crepitus appreciated on exam and minimal to no pain with joint manipulation, question this etiology. Patient does have some weakness in L quadricept and antalgic gait (noted to be chronic from chart review), could possibly  have referred pain from hip. Will defer steroid injection until able to obtain imaging. Will provide tramadol for pain relief given patient is on lithium and propensity for NSAIDs to raise concern for lithium toxicity. Offered physical therapy but patient declined. Believe he would have benefit from seeing Sports Medicine, referral placed. Follow up with PCP in a couple weeks if no improvement.  Orders Placed This Encounter  Procedures  . DG Knee Complete 4 Views Left    Standing Status:   Future    Standing Expiration Date:   09/30/2018    Order Specific Question:   Reason for Exam (SYMPTOM  OR DIAGNOSIS REQUIRED)    Answer:   knee pain    Order Specific Question:   Preferred imaging [facility]?    [facility]    Order Specific Question:   Radiology Contrast Protocol - do NOT remove file path    Answer:   [email or web address]  . Ambulatory referral to Sports Medicine    Referral Priority:   Routine    Referral Type:   Consultation    Number of Visits Requested:   1   Meds ordered this encounter  Medications  . traMADol (ULTRAM) 50 MG tablet    Sig: Take 1 tablet (50 mg total) by mouth every 6 (six) hours as needed for up to 7 days.    Dispense:  30 tablet    Refill:  0    [name] DenseAlison Shabria Egley, DO PGY-1, [facility] 07/30/2017 6:56 PM

## 2017-07-30 ENCOUNTER — Ambulatory Visit: Payer: BLUE CROSS/BLUE SHIELD | Admitting: Family Medicine

## 2017-07-30 ENCOUNTER — Other Ambulatory Visit: Payer: Self-pay

## 2017-07-30 ENCOUNTER — Encounter: Payer: Self-pay | Admitting: Family Medicine

## 2017-07-30 VITALS — BP 118/74 | HR 56 | Temp 98.1°F | Wt 242.0 lb

## 2017-07-30 DIAGNOSIS — M25562 Pain in left knee: Secondary | ICD-10-CM

## 2017-07-30 DIAGNOSIS — G8929 Other chronic pain: Secondary | ICD-10-CM

## 2017-07-30 MED ORDER — TRAMADOL HCL 50 MG PO TABS: 50.0000 mg | ORAL_TABLET | Freq: Four times a day (QID) | ORAL | 0 refills | Status: AC | PRN

## 2017-07-30 NOTE — Assessment & Plan Note (Addendum)
Likely has some contributing arthritis given improvement with intermittent NSAID use and previous relief of R knee with steroid injection. However with no crepitus appreciated on exam and minimal to no pain with joint manipulation, question this etiology. Patient does have some weakness in L quadricept and antalgic gait (noted to be chronic from chart review), could possibly have referred pain from hip. Will defer steroid injection until able to obtain imaging. Will provide tramadol for pain relief given patient is on lithium and propensity for NSAIDs to raise concern for lithium toxicity. Offered physical therapy but patient declined. Believe he would have benefit from seeing Sports Medicine, referral placed. Follow up with PCP in a couple weeks if no improvement.

## 2017-07-30 NOTE — Patient Instructions (Signed)
It was great to see you!  For your knee pain,  - I would like for you to get an xray of your knee before getting a steroid injection. I am also referring you to Sports Medicine given you did not get much relief with physical therapy in the past.  - Losing weight will help to take some strain off of your knee and will help with pain. - Take the tramadol as needed for pain and follow up with your primary doctor in a couple weeks if no better.  Take care and seek immediate care sooner if you develop any concerns.   Dr. Mollie Germanyumball [facility]

## 2017-08-05 ENCOUNTER — Ambulatory Visit (HOSPITAL_COMMUNITY)
Admission: RE | Admit: 2017-08-05 | Discharge: 2017-08-05 | Disposition: A | Discharge status: Home / Self Care | Payer: BLUE CROSS/BLUE SHIELD | Source: Ambulatory Visit | Attending: Family Medicine | Admitting: Family Medicine

## 2017-08-05 ENCOUNTER — Other Ambulatory Visit: Payer: Self-pay

## 2017-08-05 ENCOUNTER — Emergency Department (HOSPITAL_COMMUNITY): Admission: EM | Admit: 2017-08-05 | Payer: BLUE CROSS/BLUE SHIELD | Source: Home / Self Care

## 2017-08-05 DIAGNOSIS — G8929 Other chronic pain: Secondary | ICD-10-CM

## 2017-08-05 DIAGNOSIS — M25562 Pain in left knee: Secondary | ICD-10-CM | POA: Diagnosis present

## 2017-08-05 NOTE — ED Notes (Signed)
Pt needs to be registered as out patient so they may have an X-Ray.  [facility]

## 2017-08-06 ENCOUNTER — Telehealth: Payer: Self-pay | Admitting: Family Medicine

## 2017-08-06 ENCOUNTER — Telehealth: Payer: Self-pay | Admitting: Internal Medicine

## 2017-08-06 NOTE — Telephone Encounter (Signed)
Attempted to call patient regarding L knee xray results. No answer and inability to leave voicemail.   Xray shows some possible joint space narrowing but very mild. Would like for him to be evaluated further by Sports Medicine prior to steroid injection as there seems to be some joint space preservation. Happy to take his call if he has further questions.  Ellwood DenseAlison Milanna Kozlov, DO PGY-1, [facility] 08/06/2017 3:35 PM

## 2017-08-06 NOTE — Telephone Encounter (Signed)
Pt wife called and said she missed Dr Quentin Cornwallumballs call earlier. She was returning her call and would like to have Dr Linwood Dibblesumball call her back concerning her husbands results.

## 2017-08-06 NOTE — Telephone Encounter (Signed)
Returned call to patient. Informed him of xray results. Patient has appointment with Sports Medicine tomorrow.

## 2017-08-06 NOTE — Telephone Encounter (Signed)
Will forward to Dr. Rumball.  Deanda Ruddell,CMA  

## 2017-08-07 ENCOUNTER — Ambulatory Visit (INDEPENDENT_AMBULATORY_CARE_PROVIDER_SITE_OTHER): Payer: BLUE CROSS/BLUE SHIELD | Admitting: Family Medicine

## 2017-08-07 DIAGNOSIS — M25562 Pain in left knee: Secondary | ICD-10-CM

## 2017-08-07 DIAGNOSIS — G8929 Other chronic pain: Secondary | ICD-10-CM

## 2017-08-07 MED ORDER — METHYLPREDNISOLONE ACETATE 40 MG/ML IJ SUSP
40.0000 mg | Freq: Once | INTRAMUSCULAR | Status: AC
  Administered 2017-08-07: 40 mg via INTRA_ARTICULAR

## 2017-08-07 NOTE — Patient Instructions (Signed)
Your pain is due to mild arthritis. These are the different medications you can take for this: Tylenol 500mg  1-2 tabs three times a day for pain. Capsaicin, aspercreme, or biofreeze topically up to four times a day may also help with pain. Some supplements that may help for arthritis: Boswellia extract, curcumin, pycnogenol Cortisone injections are an option - you were given this today. It's important that you continue to stay active. Straight leg raises, knee extensions 3 sets of 10 once a day (add ankle weight if these become too easy). Consider physical therapy to strengthen muscles around the joint that hurts to take pressure off of the joint itself. Shoe inserts with good arch support may be helpful. Heat or ice 15 minutes at a time 3-4 times a day as needed to help with pain. Water aerobics and cycling with low resistance are the best two types of exercise for arthritis though any exercise is ok as long as it doesn't worsen the pain. Follow up with me in 1 month.

## 2017-08-09 ENCOUNTER — Encounter: Payer: Self-pay | Admitting: Family Medicine

## 2017-08-09 NOTE — Assessment & Plan Note (Signed)
independently reviewed radiographs and mild arthropathy noted but films are not weight bearing.  Exam is reassuring.  Consistent with flare of arthritis.  Discussed tylenol, topical medications, supplements that may help.  Intraarticular cortisone injection given today.  Shown home exercises.  Heat/ice if needed.  F/u in 1 month.  After informed written consent timeout was performed, patient was seated on exam table. Left knee was prepped with alcohol swab and utilizing anteromedial approach, patient's left knee was injected intraarticularly with 3:1 bupivicaine: depomedrol. Patient tolerated the procedure well without immediate complications.

## 2017-08-09 NOTE — Progress Notes (Signed)
PCP and consultation requested by: Arvilla MarketWallace, Catherine Lauren, DO  Subjective:   HPI: Patient is a 57 y.o. male here for left knee pain.  Patient reports for about 3+ months he's had anterior left knee pain. About 1 1/2 years ago he had similar problems with right knee and injection helped tremendously. Pain in left knee is 8/10 level, achy. Tramadol helps some. He had a fall about a month ago but had pain prior to this. Advil with mild benefit as well. No skin changes, numbness.  Past Medical History:  Diagnosis Date  . Alcohol abuse   . Bipolar disorder (HCC)   . Diabetes mellitus   . Gastric ulcer   . Hepatic encephalopathy (HCC)   . Hyperkalemia 12/08/2014  . Hypertension     Current Outpatient Medications on File Prior to Visit  Medication Sig Dispense Refill  . allopurinol (ZYLOPRIM) 300 MG tablet Take 1 tablet (300 mg total) by mouth daily. 30 tablet 11  . amLODipine (NORVASC) 10 MG tablet Take 1 tablet (10 mg total) by mouth daily. 90 tablet 3  . aspirin EC 81 MG tablet Take 81 mg by mouth daily.    Marland Kitchen. atorvastatin (LIPITOR) 40 MG tablet Take 1 tablet (40 mg total) by mouth daily. 90 tablet 3  . FLUoxetine (PROZAC) 40 MG capsule Take 40 mg by mouth daily.   9  . folic acid (FOLVITE) 1 MG tablet TAKE 1 TABLET (1 MG TOTAL) BY MOUTH DAILY. 30 tablet 11  . ibuprofen (ADVIL,MOTRIN) 200 MG tablet Take 400 mg by mouth every 6 (six) hours as needed (for pain).    Marland Kitchen. lithium (ESKALITH) 450 MG CR tablet Take 1 tablet (450 mg total) by mouth daily. For mental health 30 tablet 6  . metoprolol tartrate (LOPRESSOR) 100 MG tablet Take 1 tablet (100 mg total) by mouth 2 (two) times daily. 60 tablet 0  . NICOTINE STEP 1 21 MG/24HR patch PLACE 1 PATCH ONTO THE SKIN DAILY. 28 patch 2  . omega-3 acid ethyl esters (LOVAZA) 1 g capsule TAKE 1 CAPSULE (1 G TOTAL) BY MOUTH DAILY. 30 capsule 11  . pantoprazole (PROTONIX) 40 MG tablet Take 1 tablet (40 mg total) by mouth daily. 30 tablet 3   No  current facility-administered medications on file prior to visit.     Past Surgical History:  Procedure Laterality Date  . ESOPHAGOGASTRODUODENOSCOPY (EGD) WITH PROPOFOL Left 12/09/2014   Procedure: ESOPHAGOGASTRODUODENOSCOPY (EGD) WITH PROPOFOL;  Surgeon: Bernette Redbirdobert Buccini, MD;  [facility];  Service: Endoscopy;  Laterality: Left;    Allergies  Allergen Reactions  . Ace Inhibitors Anaphylaxis and Swelling    Tongue and face became swollen  . Penicillins Rash    From childhood: Has patient had a PCN reaction causing immediate rash, facial/tongue/throat swelling, SOB or lightheadedness with hypotension: Yes Has patient had a PCN reaction causing severe rash involving mucus membranes or skin necrosis: Unk Has patient had a PCN reaction that required hospitalization: Unk Has patient had a PCN reaction occurring within the last 10 years: No If all of the above answers are "NO", then may proceed with Cephalosporin use.     Social History   Socioeconomic History  . Marital status: Married    Spouse name: Not on file  . Number of children: Not on file  . Years of education: Not on file  . Highest education level: Not on file  Occupational History  . Not on file  Social Needs  . Financial resource strain: Not  on file  . Food insecurity:    Worry: Not on file    Inability: Not on file  . Transportation needs:    Medical: Not on file    Non-medical: Not on file  Tobacco Use  . Smoking status: Current Every Day Smoker    Packs/day: 0.50    Years: 30.00    Pack years: 15.00    Types: Cigarettes  . Smokeless tobacco: Never Used  . Tobacco comment: does not want to quitt right now  Substance and Sexual Activity  . Alcohol use: Yes    Alcohol/week: 8.4 oz    Types: 14 Cans of beer per week    Comment: " every friday I get a 5th "  . Drug use: No  . Sexual activity: Not on file  Lifestyle  . Physical activity:    Days per week: Not on file    Minutes per session:  Not on file  . Stress: Not on file  Relationships  . Social connections:    Talks on phone: Not on file    Gets together: Not on file    Attends religious service: Not on file    Active member of club or organization: Not on file    Attends meetings of clubs or organizations: Not on file    Relationship status: Not on file  . Intimate partner violence:    Fear of current or ex partner: Not on file    Emotionally abused: Not on file    Physically abused: Not on file    Forced sexual activity: Not on file  Other Topics Concern  . Not on file  Social History Narrative  . Not on file    History reviewed. No pertinent family history.  BP (!) 131/95   Ht 6' (1.829 m)   Wt 243 lb (110.2 kg)   BMI 32.96 kg/m   Review of Systems: See HPI above.     Objective:  Physical Exam:  Gen: NAD, comfortable in exam room  Left knee: No gross deformity, ecchymoses, swelling. TTP medial joint line.  No other tenderness FROM with 5/5 strength. Negative ant/post drawers. Negative valgus/varus testing. Negative lachmanns. Negative mcmurrays, apleys, patellar apprehension. NV intact distally.  Right knee: No deformity. FROM with 5/5 strength. No tenderness to palpation. NVI distally.   Assessment & Plan:  1. Left knee pain - independently reviewed radiographs and mild arthropathy noted but films are not weight bearing.  Exam is reassuring.  Consistent with flare of arthritis.  Discussed tylenol, topical medications, supplements that may help.  Intraarticular cortisone injection given today.  Shown home exercises.  Heat/ice if needed.  F/u in 1 month.  After informed written consent timeout was performed, patient was seated on exam table. Left knee was prepped with alcohol swab and utilizing anteromedial approach, patient's left knee was injected intraarticularly with 3:1 bupivicaine: depomedrol. Patient tolerated the procedure well without immediate complications.

## 2017-10-21 ENCOUNTER — Other Ambulatory Visit: Payer: Self-pay

## 2017-10-21 ENCOUNTER — Other Ambulatory Visit: Payer: Self-pay | Admitting: Family Medicine

## 2017-10-21 ENCOUNTER — Telehealth: Payer: Self-pay

## 2017-10-21 DIAGNOSIS — I10 Essential (primary) hypertension: Secondary | ICD-10-CM

## 2017-10-21 DIAGNOSIS — K279 Peptic ulcer, site unspecified, unspecified as acute or chronic, without hemorrhage or perforation: Secondary | ICD-10-CM

## 2017-10-21 MED ORDER — PANTOPRAZOLE SODIUM 40 MG PO TBEC: 40.0000 mg | DELAYED_RELEASE_TABLET | Freq: Every day | ORAL | 3 refills | Status: DC

## 2017-10-21 MED ORDER — METOPROLOL TARTRATE 100 MG PO TABS: 100.0000 mg | ORAL_TABLET | Freq: Two times a day (BID) | ORAL | 0 refills | Status: DC

## 2017-11-05 ENCOUNTER — Emergency Department (HOSPITAL_COMMUNITY): Payer: BLUE CROSS/BLUE SHIELD

## 2017-11-05 ENCOUNTER — Emergency Department (HOSPITAL_COMMUNITY)
Admission: EM | Admit: 2017-11-05 | Discharge: 2017-11-05 | Disposition: A | Discharge status: Home / Self Care | Payer: BLUE CROSS/BLUE SHIELD | Attending: Emergency Medicine | Admitting: Emergency Medicine

## 2017-11-05 ENCOUNTER — Encounter (HOSPITAL_COMMUNITY): Payer: Self-pay | Admitting: *Deleted

## 2017-11-05 ENCOUNTER — Other Ambulatory Visit: Payer: Self-pay

## 2017-11-05 DIAGNOSIS — W19XXXA Unspecified fall, initial encounter: Secondary | ICD-10-CM | POA: Diagnosis not present

## 2017-11-05 DIAGNOSIS — Z79899 Other long term (current) drug therapy: Secondary | ICD-10-CM | POA: Diagnosis not present

## 2017-11-05 DIAGNOSIS — Y999 Unspecified external cause status: Secondary | ICD-10-CM | POA: Insufficient documentation

## 2017-11-05 DIAGNOSIS — Z7982 Long term (current) use of aspirin: Secondary | ICD-10-CM | POA: Insufficient documentation

## 2017-11-05 DIAGNOSIS — F17210 Nicotine dependence, cigarettes, uncomplicated: Secondary | ICD-10-CM | POA: Diagnosis not present

## 2017-11-05 DIAGNOSIS — Y9289 Other specified places as the place of occurrence of the external cause: Secondary | ICD-10-CM | POA: Insufficient documentation

## 2017-11-05 DIAGNOSIS — Z23 Encounter for immunization: Secondary | ICD-10-CM | POA: Diagnosis not present

## 2017-11-05 DIAGNOSIS — Y9389 Activity, other specified: Secondary | ICD-10-CM | POA: Insufficient documentation

## 2017-11-05 DIAGNOSIS — S0591XA Unspecified injury of right eye and orbit, initial encounter: Secondary | ICD-10-CM | POA: Diagnosis present

## 2017-11-05 DIAGNOSIS — S0561XA Penetrating wound without foreign body of right eyeball, initial encounter: Secondary | ICD-10-CM | POA: Diagnosis not present

## 2017-11-05 DIAGNOSIS — I10 Essential (primary) hypertension: Secondary | ICD-10-CM | POA: Diagnosis not present

## 2017-11-05 LAB — BASIC METABOLIC PANEL
Anion gap: 14 (ref 5–15)
BUN: 6 mg/dL (ref 6–20)
CO2: 20 mmol/L — ABNORMAL LOW (ref 22–32)
Calcium: 9.5 mg/dL (ref 8.9–10.3)
Chloride: 106 mmol/L (ref 98–111)
Creatinine, Ser: 1.09 mg/dL (ref 0.61–1.24)
GFR calc Af Amer: >60 mL/min (ref >60)
GFR calc non Af Amer: >60 mL/min (ref >60)
Glucose, Bld: 107 mg/dL — ABNORMAL HIGH (ref 70–99)
Potassium: 3.9 mmol/L (ref 3.5–5.1)
Sodium: 140 mmol/L (ref 135–145)

## 2017-11-05 LAB — CBC WITH DIFFERENTIAL/PLATELET
Abs Immature Granulocytes: 0.1 10*3/uL (ref 0.0–0.1)
Basophils Absolute: 0.1 10*3/uL (ref 0.0–0.1)
Basophils Relative: 1 %
Eosinophils Absolute: 0.2 10*3/uL (ref 0.0–0.7)
Eosinophils Relative: 2 %
HCT: 43.5 % (ref 39.0–52.0)
Hemoglobin: 14.1 g/dL (ref 13.0–17.0)
Immature Granulocytes: 1 %
Lymphocytes Relative: 29 %
Lymphs Abs: 3.2 10*3/uL (ref 0.7–4.0)
MCH: 33.2 pg (ref 26.0–34.0)
MCHC: 32.4 g/dL (ref 30.0–36.0)
MCV: 102.4 fL — ABNORMAL HIGH (ref 78.0–100.0)
Monocytes Absolute: 0.7 10*3/uL (ref 0.1–1.0)
Monocytes Relative: 7 %
Neutro Abs: 6.7 10*3/uL (ref 1.7–7.7)
Neutrophils Relative %: 60 %
Platelets: 205 10*3/uL (ref 150–400)
RBC: 4.25 MIL/uL (ref 4.22–5.81)
RDW: 13 % (ref 11.5–15.5)
WBC: 11 10*3/uL — ABNORMAL HIGH (ref 4.0–10.5)

## 2017-11-05 MED ORDER — VANCOMYCIN HCL 10 G IV SOLR: 15.0000 mg/kg | Freq: Once | INTRAVENOUS | Status: DC

## 2017-11-05 MED ORDER — DEXTROSE 5 % IV SOLN: 50.0000 mg/kg | Freq: Once | INTRAVENOUS | Status: DC

## 2017-11-05 MED ORDER — TETANUS-DIPHTH-ACELL PERTUSSIS 5-2.5-18.5 LF-MCG/0.5 IM SUSP
0.5000 mL | Freq: Once | INTRAMUSCULAR | Status: AC
  Administered 2017-11-05: 0.5 mL via INTRAMUSCULAR
  Filled 2017-11-05: qty 0.5

## 2017-11-05 MED ORDER — HYDROCODONE-ACETAMINOPHEN 5-325 MG PO TABS
1.0000 | ORAL_TABLET | Freq: Once | ORAL | Status: AC
  Administered 2017-11-05: 1 via ORAL
  Filled 2017-11-05: qty 1

## 2017-11-05 NOTE — ED Provider Notes (Signed)
[facility] EMERGENCY DEPARTMENT Provider Note   CSN: [id number] Arrival date & time: 11/05/17  1551     History   Chief Complaint Chief Complaint  Patient presents with  . Fall  . Eye Injury    HPI [name] [name] is a 57 y.o. male.  The history is provided by the patient.  Fall  This is a new problem. Episode onset: 5 days ago. The problem has not changed since onset.Pertinent negatives include no chest pain, no abdominal pain and no shortness of breath. Nothing aggravates the symptoms. Nothing relieves the symptoms. Treatments tried: cleaning the wound and ice packs. The treatment provided no relief.  Eye Injury  Pertinent negatives include no chest pain, no abdominal pain and no shortness of breath.    Past Medical History:  Diagnosis Date  . Alcohol abuse   . Arthritis    left knee  . Bipolar disorder (HCC)   . Depression   . Gastric ulcer   . GERD (gastroesophageal reflux disease)   . Hepatic encephalopathy (HCC)   . Hyperkalemia 12/08/2014  . Hypertension   . Pneumonia   . Stroke [facility])    a few spanning from 1997 to last year    Patient Active Problem List   Diagnosis Date Noted  . Chronic pain of left knee 07/30/2017  . Chronic pain of right knee 02/13/2016  . Spasm of muscle   . Left leg weakness 07/27/2015  . History of stroke   . Absence of bladder continence   . Fall   . Essential hypertension   . Bipolar 1 disorder, mixed, moderate (HCC)   . AKI (acute kidney injury) (HCC)   . Gastrointestinal hemorrhage associated with duodenal ulcer   . Hyperkalemia 12/08/2014  . Tinea corporis 12/25/2011  . Hepatic encephalopathy (HCC) 09/03/2010  . Hepatic steatosis 06/09/2010  . Bipolar disorder (HCC) 05/14/2010  . Hepatomegaly 05/12/2010  . ANEMIA, FOLIC ACID DEFICIENCY 08/02/2009  . Abnormality of gait 07/28/2009  . Alcoholism (HCC) 07/11/2009  . PROTEINURIA 08/07/2006  . HYPERTRIGLYCERIDEMIA 04/25/2006  . GOUT, CHRONIC  04/25/2006  . OBESITY, NOS 04/25/2006  . DEPRESSION, MAJOR, RECURRENT 04/25/2006  . TOBACCO DEPENDENCE 04/25/2006  . HYPERTENSION, BENIGN SYSTEMIC 04/25/2006  . Peptic ulcer 04/25/2006  . IMPOTENCE, ORGANIC 04/25/2006    Past Surgical History:  Procedure Laterality Date  . COLONOSCOPY    . ESOPHAGOGASTRODUODENOSCOPY (EGD) WITH PROPOFOL Left 12/09/2014   Procedure: ESOPHAGOGASTRODUODENOSCOPY (EGD) WITH PROPOFOL;  Surgeon: Bernette Redbird, MD;  Location: [facility] ENDOSCOPY;  Service: Endoscopy;  Laterality: Left;  . EYE SURGERY     eye lid was split        Home Medications    Prior to Admission medications   Medication Sig Start Date End Date Taking? Authorizing Provider  allopurinol (ZYLOPRIM) 300 MG tablet Take 1 tablet (300 mg total) by mouth daily. 05/28/17   Arvilla Market, DO  amLODipine (NORVASC) 10 MG tablet Take 1 tablet (10 mg total) by mouth daily. 05/28/17   Arvilla Market, DO  aspirin EC 81 MG tablet Take 81 mg by mouth daily.    [provider]  atorvastatin (LIPITOR) 40 MG tablet Take 1 tablet (40 mg total) by mouth daily. 05/28/17   Arvilla Market, DO  diphenhydrAMINE-Acetaminophen (TYLENOL COLD RELIEF PO) Take 1 tablet by mouth as needed (cold symptoms).    [provider]  FLUoxetine (PROZAC) 40 MG capsule Take 40 mg by mouth daily.  03/21/15   [provider]  folic acid (FOLVITE) 1 MG tablet TAKE 1 TABLET (1 MG TOTAL) BY MOUTH DAILY. 01/25/17   Mayo, Allyn Kenner, MD  ibuprofen (ADVIL,MOTRIN) 200 MG tablet Take 400 mg by mouth every 6 (six) hours as needed (for pain).    [provider]  lithium (ESKALITH) 450 MG CR tablet Take 1 tablet (450 mg total) by mouth daily. For mental health 05/12/10   Floydene Flock, MD  metoprolol tartrate (LOPRESSOR) 100 MG tablet Take 1 tablet (100 mg total) by mouth 2 (two) times daily. 10/21/17   Bland, Scott, DO  NICOTINE STEP 1 21 MG/24HR patch PLACE 1 PATCH ONTO THE SKIN  DAILY. Patient taking differently: Place 21 mg onto the skin daily.  05/09/17   Arvilla Market, DO  omega-3 acid ethyl esters (LOVAZA) 1 g capsule TAKE 1 CAPSULE (1 G TOTAL) BY MOUTH DAILY. 01/25/17   Mayo, Allyn Kenner, MD  pantoprazole (PROTONIX) 40 MG tablet Take 1 tablet (40 mg total) by mouth daily. 10/21/17   Marthenia Rolling, DO    Family History History reviewed. No pertinent family history.  Social History Social History   Tobacco Use  . Smoking status: Current Every Day Smoker    Packs/day: 0.50    Years: 30.00    Pack years: 15.00    Types: Cigarettes  . Smokeless tobacco: Never Used  . Tobacco comment: does not want to quitt right now  Substance Use Topics  . Alcohol use: Yes    Alcohol/week: 14.0 standard drinks    Types: 14 Cans of beer per week    Comment: " every friday I get a 5th "  . Drug use: No     Allergies   Ace inhibitors and Penicillins   Review of Systems Review of Systems  Constitutional: Negative for chills and fever.  HENT: Negative for ear pain and sore throat.   Eyes: Positive for pain and visual disturbance.  Respiratory: Negative for cough and shortness of breath.   Cardiovascular: Negative for chest pain and palpitations.  Gastrointestinal: Negative for abdominal pain and vomiting.  Genitourinary: Negative for dysuria and hematuria.  Musculoskeletal: Negative for arthralgias and back pain.  Skin: Negative for color change and rash.  Neurological: Negative for seizures and syncope.  All other systems reviewed and are negative.    Physical Exam Updated Vital Signs BP (!) 156/90   Pulse (!) 59   Temp 98.7 F (37.1 C) (Oral)   Resp 13   SpO2 100%   Physical Exam  Constitutional: He appears well-developed and well-nourished.  HENT:  Head: Normocephalic.  Periorbital bruising around right eye  Eyes:  Right eye open globe injury, unable to see light with right eye  Neck: Neck supple.  Cardiovascular: Normal rate and  regular rhythm.  No murmur heard. Pulmonary/Chest: Effort normal and breath sounds normal. No respiratory distress.  Abdominal: Soft. There is no tenderness.  Musculoskeletal: He exhibits no edema.  Neurological: He is alert.  Skin: Skin is warm and dry.  Psychiatric: He has a normal mood and affect.  Nursing note and vitals reviewed.    ED Treatments / Results  Labs (all labs ordered are listed, but only abnormal results are displayed) Labs Reviewed  BASIC METABOLIC PANEL - Abnormal; Notable for the following components:      Result Value   CO2 20 (*)    Glucose, Bld 107 (*)    All other components within normal limits  CBC WITH DIFFERENTIAL/PLATELET - Abnormal; Notable for the following  components:   WBC 11.0 (*)    MCV 102.4 (*)    All other components within normal limits    EKG EKG Interpretation  Date/Time:  Tuesday November 05 2017 16:49:09 EDT Ventricular Rate:  63 PR Interval:  188 QRS Duration: 90 QT Interval:  396 QTC Calculation: 405 R Axis:   -27 Text Interpretation:  Normal sinus rhythm Moderate voltage criteria for LVH, may be normal variant Borderline ECG No significant change since last tracing Confirmed by Lorre Nick ([id number]) on 11/05/2017 8:21:06 PM   Radiology Ct Lorelee Cover W/o Cm  Result Date: 11/05/2017 CLINICAL DATA:  Initial evaluation for acute trauma to right eye, vision loss. EXAM: CT ORBITS WITHOUT CONTRAST TECHNIQUE: Multidetector CT images were obtained using the standard protocol without intravenous contrast. COMPARISON:  None. FINDINGS: Orbits: Abnormal appearance of the right globe with loss of normal rounded contour, now somewhat flattened relative to the contralateral left globe. Margins somewhat ill-defined with surrounding stranding. Internal heterogeneous hyperdensity, concerning for hemorrhage. The lens is not well seen, and may be displaced/dislocated and/or injured. Constellation of findings concerning for traumatic load injury with  possible globe rupture/open globe. The right optic nerve intact without abnormality. Extraocular muscles intact. No retro-orbital hematoma. Lacrimal gland within normal limits. Left globe and left orbital structures within normal limits. Faint linear lucencies extending through the right orbital floor concerning for acute nondisplaced fractures (series 9, image 23 on coronal sequence, series 10, image 27 on sagittal sequence. Right lamina papyracea of and orbital roof grossly intact. Remote left orbital floor fracture noted. Remainder the visualized osseous structures within normal limits. Visualized sinuses: Moderate mucosal thickening throughout the right maxillary sinus with superimposed air-fluid level. Layering hyperdensity may reflect blood products. Mucosal thickening seen within the right ethmoidal air cells as well. Visualized paranasal sinuses otherwise clear. Trace left mastoid effusion. Mastoid air cells otherwise clear as are the middle ear cavities. Soft tissues: Right periorbital soft tissue swelling and contusion. No frank hematoma. Limited intracranial: Age-related cerebral atrophy. Visualized intracranial contents otherwise unremarkable. IMPRESSION: 1. Acute traumatic injury to the right globe with findings concerning for traumatic globe rupture/open globe as above. Remainder of the right orbital structures intact. No retro-orbital hematoma. 2. Subtle lucencies through the right orbital floor, suspicious for acute nondisplaced orbital floor fractures. 3. Mucosal thickening with probable layering blood products within the right maxillary sinus. Electronically Signed   By: Rise Mu M.D.   On: 11/05/2017 20:51    Procedures Procedures (including critical care time)  Medications Ordered in ED Medications  HYDROcodone-acetaminophen (NORCO/VICODIN) 5-325 MG per tablet 1 tablet (1 tablet Oral Given 11/05/17 1805)  Tdap (BOOSTRIX) injection 0.5 mL (0.5 mLs Intramuscular Given 11/05/17  2309)     Initial Impression / Assessment and Plan / ED Course  I have reviewed the triage vital signs and the nursing notes.  Pertinent labs & imaging results that were available during my care of the patient were reviewed by me and considered in my medical decision making (see chart for details).     The patient is a 57yoM with PMH EtOH use who presents with injury to his right eye 5 days ago. Physical exam and CT reveal right sided open globe. Ophthalmology consulted and recommended follow up tomorrow morning for surgery. The patient received Tdap but declined to wait for antibiotics in the ED. The patient agrees to follow up for surgery tomorrow. Patient reports understanding with and agreement of discharge plan and return precautions.  Patient care supervised by Dr.  Les Pou, MD  Final Clinical Impressions(s) / ED Diagnoses   Final diagnoses:  Penetrating eye injury of right eye, initial encounter    ED Discharge Orders    None       Nash Dimmer, MD 11/06/17 1502    Lorre Nick, MD 11/06/17 [phone]

## 2017-11-05 NOTE — ED Notes (Signed)
Patient transported to CT 

## 2017-11-05 NOTE — ED Notes (Signed)
Provider at bedside

## 2017-11-05 NOTE — ED Notes (Signed)
Dr. Patel at bedside 

## 2017-11-05 NOTE — Consult Note (Signed)
[name] [name]                                                                               11/05/2017                                               Ophthalmology Consultation                                         Consult requested by: Dr. Freida Busman  Reason for consultation: blunt trauma to right globe with associated rupture  HPI: 5 days prior patient fell and ruptured right globe.  Denies significant pain.  No purulent discharge.    Pertinent Medical History:   Active Ambulatory Problems    Diagnosis Date Noted  . HYPERTRIGLYCERIDEMIA 04/25/2006  . GOUT, CHRONIC 04/25/2006  . OBESITY, NOS 04/25/2006  . ANEMIA, FOLIC ACID DEFICIENCY 08/02/2009  . DEPRESSION, MAJOR, RECURRENT 04/25/2006  . Alcoholism (HCC) 07/11/2009  . TOBACCO DEPENDENCE 04/25/2006  . HYPERTENSION, BENIGN SYSTEMIC 04/25/2006  . Peptic ulcer 04/25/2006  . IMPOTENCE, ORGANIC 04/25/2006  . Abnormality of gait 07/28/2009  . PROTEINURIA 08/07/2006  . Hepatomegaly 05/12/2010  . Bipolar disorder (HCC) 05/14/2010  . Hepatic steatosis 06/09/2010  . Hepatic encephalopathy (HCC) 09/03/2010  . Tinea corporis 12/25/2011  . Hyperkalemia 12/08/2014  . Gastrointestinal hemorrhage associated with duodenal ulcer   . Left leg weakness 07/27/2015  . History of stroke   . Absence of bladder continence   . Fall   . Essential hypertension   . Bipolar 1 disorder, mixed, moderate (HCC)   . AKI (acute kidney injury) (HCC)   . Spasm of muscle   . Chronic pain of right knee 02/13/2016  . Chronic pain of left knee 07/30/2017   Resolved Ambulatory Problems    Diagnosis Date Noted  . LEG PAIN, BILATERAL 07/28/2009  . WEIGHT LOSS, RECENT 07/11/2009  . ABDOMINAL PAIN OTHER SPECIFIED SITE 07/11/2009  . TRANSAMINASES, SERUM, ELEVATED 09/09/2008  . Gastric ulcer 08/28/2010  . Aspiration pneumonia due to vomit (HCC) 08/28/2010  . Routine health maintenance 03/31/2013  . On statin therapy due to risk of future cardiovascular event  03/29/2014  . Metabolic acidosis   . Abdominal pain 12/08/2014   Past Medical History:  Diagnosis Date  . Alcohol abuse   . Diabetes mellitus   . Gastric ulcer   . Hypertension      Pertinent Ophthalmic History: None      Current Eye Medications:    Systemic medications on admission:   (Not in a hospital admission)      ROS: Eyes - trauma, rupture, Psych - ETOH intoxication, HENT - blunt trauma to right orbit, Negative (M/S, Neuro, GI, GU, CV, Resp) Integ - bruising and periorbital edema  Visual Fields: FTC OS, None OD   Pupils:  Irregular, hemorrhage OD  Near acuity:   NLP   OD      J2 OS     TA:  Rupture OD  Normal to palpation OS        External:   OD:  Periorbital edema    OS:  Normal    Anterior segment exam:  By penlight    Conjunctiva:  OD:  hemorrhage   OS:  Quiet     Cornea:    OD: laceration along superior cornea extending into sclera   OS: Clear, no fluorescein stain      Anterior Chamber:   OD:  shallow   OS:  Deep/quiet     Iris:    OD:  Corectopia   OS:  Normal      Lens:    OD:  Not present on CT      OS:  Clear          Optic disc:  OD:  No view  OS:  Flat, sharp, pink, healthy       Impression:   Right globe rupture OD with laceration of cornea and periorbital edema  Recommendations/Plan: Evisceration OD   I've discussed these findings with the resident and attending. Please contact our office with any questions or concerns at [phone]  [facility]

## 2017-11-05 NOTE — ED Provider Notes (Addendum)
Patient placed in Quick Look pathway, seen and evaluated   Chief Complaint: Right eye swelling  HPI:   Patient fell 5 days ago after getting up from the couch.  States he became lightheaded.  Denies syncope.  Residual deficits from previous stroke.  Since then has had right eye swelling, denies pain.  Notes he has not been able to see anything out of that eye since the fall.  Intermittent bleeding from the area.  Fevers or chills.  Denies numbness or weakness out of the ordinary for him.  ROS: Positive for eye injury, eye redness, vision change, lightheadedness Negative for syncope, headaches, fevers, numbness  Physical Exam:   Gen: No distress  Neuro: Awake and Alert  Skin: Warm    Focused Exam: Right eye with significant chemosis, some bleeding.  Cornea appears irregular, deformed, no visible pupillary reflex.  Limited EOMs in all directions.  Mild proptosis.  Unable to visualize light.  Significant surrounding ecchymosis and swelling.      Initiation of care has begun. The patient has been counseled on the process, plan, and necessity for staying for the completion/evaluation, and the remainder of the medical screening examination    Jeanie Sewer, PA-C 11/05/17 1632    Jeanie Sewer, PA-C 11/05/17 1633    Lorre Nick, MD 11/06/17 [phone]

## 2017-11-05 NOTE — ED Provider Notes (Signed)
I saw and evaluated the patient, reviewed the resident's note and I agree with the findings and plan.  EKG: None 57 year old male who fell several days ago onto his right eye.  He has loss of vision and on exam appears to have a globe injury.  Will order CT maxillofacial and consult ophthalmology   Lorre Nick, MD 11/05/17 1731

## 2017-11-05 NOTE — ED Triage Notes (Signed)
Pt had a fall on Friday, reports swelling to both eyes after the fall. Left eye has improved but right still has significant swelling.

## 2017-11-06 ENCOUNTER — Other Ambulatory Visit: Payer: Self-pay

## 2017-11-06 ENCOUNTER — Encounter (HOSPITAL_COMMUNITY): Payer: Self-pay

## 2017-11-06 ENCOUNTER — Encounter (HOSPITAL_COMMUNITY)
Admission: RE | Disposition: A | Discharge status: Home / Self Care | Payer: Self-pay | Source: Ambulatory Visit | Attending: Ophthalmology

## 2017-11-06 ENCOUNTER — Ambulatory Visit: Payer: Self-pay | Admitting: Ophthalmology

## 2017-11-06 ENCOUNTER — Ambulatory Visit (HOSPITAL_COMMUNITY)
Admission: RE | Admit: 2017-11-06 | Discharge: 2017-11-06 | Disposition: A | Discharge status: Home / Self Care | Payer: BLUE CROSS/BLUE SHIELD | Source: Ambulatory Visit | Attending: Ophthalmology | Admitting: Ophthalmology

## 2017-11-06 ENCOUNTER — Ambulatory Visit (HOSPITAL_COMMUNITY): Payer: BLUE CROSS/BLUE SHIELD | Admitting: Anesthesiology

## 2017-11-06 DIAGNOSIS — K219 Gastro-esophageal reflux disease without esophagitis: Secondary | ICD-10-CM | POA: Insufficient documentation

## 2017-11-06 DIAGNOSIS — W19XXXA Unspecified fall, initial encounter: Secondary | ICD-10-CM | POA: Diagnosis not present

## 2017-11-06 DIAGNOSIS — X58XXXA Exposure to other specified factors, initial encounter: Secondary | ICD-10-CM | POA: Insufficient documentation

## 2017-11-06 DIAGNOSIS — S0531XA Ocular laceration without prolapse or loss of intraocular tissue, right eye, initial encounter: Secondary | ICD-10-CM | POA: Insufficient documentation

## 2017-11-06 DIAGNOSIS — Z8711 Personal history of peptic ulcer disease: Secondary | ICD-10-CM | POA: Insufficient documentation

## 2017-11-06 DIAGNOSIS — Z880 Allergy status to penicillin: Secondary | ICD-10-CM | POA: Diagnosis not present

## 2017-11-06 DIAGNOSIS — I10 Essential (primary) hypertension: Secondary | ICD-10-CM | POA: Diagnosis not present

## 2017-11-06 DIAGNOSIS — Z888 Allergy status to other drugs, medicaments and biological substances status: Secondary | ICD-10-CM | POA: Insufficient documentation

## 2017-11-06 DIAGNOSIS — Z8673 Personal history of transient ischemic attack (TIA), and cerebral infarction without residual deficits: Secondary | ICD-10-CM | POA: Insufficient documentation

## 2017-11-06 DIAGNOSIS — F319 Bipolar disorder, unspecified: Secondary | ICD-10-CM | POA: Insufficient documentation

## 2017-11-06 DIAGNOSIS — K760 Fatty (change of) liver, not elsewhere classified: Secondary | ICD-10-CM | POA: Insufficient documentation

## 2017-11-06 DIAGNOSIS — Z7982 Long term (current) use of aspirin: Secondary | ICD-10-CM | POA: Insufficient documentation

## 2017-11-06 DIAGNOSIS — F17200 Nicotine dependence, unspecified, uncomplicated: Secondary | ICD-10-CM | POA: Insufficient documentation

## 2017-11-06 DIAGNOSIS — Q858 Other phakomatoses, not elsewhere classified: Secondary | ICD-10-CM | POA: Insufficient documentation

## 2017-11-06 DIAGNOSIS — Z79899 Other long term (current) drug therapy: Secondary | ICD-10-CM | POA: Diagnosis not present

## 2017-11-06 HISTORY — DX: Gastro-esophageal reflux disease without esophagitis: K21.9

## 2017-11-06 HISTORY — DX: Major depressive disorder, single episode, unspecified: F32.9

## 2017-11-06 HISTORY — PX: EVISCERATION: SHX1539

## 2017-11-06 HISTORY — DX: Unspecified osteoarthritis, unspecified site: M19.90

## 2017-11-06 HISTORY — DX: Depression, unspecified: F32.A

## 2017-11-06 HISTORY — DX: Cerebral infarction, unspecified: I63.9

## 2017-11-06 HISTORY — DX: Pneumonia, unspecified organism: J18.9

## 2017-11-06 SURGERY — REPAIR, EVISCERATION, ABDOMEN
Anesthesia: General | Site: Eye | Laterality: Right

## 2017-11-06 MED ORDER — PROMETHAZINE HCL 25 MG/ML IJ SOLN: 6.2500 mg | INTRAMUSCULAR | Status: DC | PRN

## 2017-11-06 MED ORDER — PROPARACAINE HCL 0.5 % OP SOLN
1.0000 [drp] | Freq: Once | OPHTHALMIC | Status: AC
  Administered 2017-11-06: 1 [drp] via OPHTHALMIC
  Filled 2017-11-06: qty 15

## 2017-11-06 MED ORDER — PROPOFOL 10 MG/ML IV BOLUS
INTRAVENOUS | Status: DC | PRN
  Administered 2017-11-06: 200 mg via INTRAVENOUS

## 2017-11-06 MED ORDER — MIDAZOLAM HCL 2 MG/2ML IJ SOLN
INTRAMUSCULAR | Status: AC
  Filled 2017-11-06: qty 2

## 2017-11-06 MED ORDER — STERILE WATER FOR IRRIGATION IR SOLN
Status: DC | PRN
  Administered 2017-11-06: 1000 mL

## 2017-11-06 MED ORDER — DEXAMETHASONE SODIUM PHOSPHATE 10 MG/ML IJ SOLN
INTRAMUSCULAR | Status: DC | PRN
  Administered 2017-11-06: 10 mg via INTRAVENOUS

## 2017-11-06 MED ORDER — BUPIVACAINE HCL (PF) 0.75 % IJ SOLN
INTRAMUSCULAR | Status: AC
  Filled 2017-11-06: qty 10

## 2017-11-06 MED ORDER — HYALURONIDASE HUMAN 150 UNIT/ML IJ SOLN
INTRAMUSCULAR | Status: AC
  Filled 2017-11-06: qty 1

## 2017-11-06 MED ORDER — LIDOCAINE HCL 2 % IJ SOLN
INTRAMUSCULAR | Status: AC
  Filled 2017-11-06: qty 20

## 2017-11-06 MED ORDER — CEFAZOLIN SODIUM-DEXTROSE 2-3 GM-%(50ML) IV SOLR
INTRAVENOUS | Status: DC | PRN
  Administered 2017-11-06: 2 g via INTRAVENOUS

## 2017-11-06 MED ORDER — PROPARACAINE HCL 0.5 % OP SOLN
1.0000 [drp] | OPHTHALMIC | Status: AC | PRN
  Administered 2017-11-06 (×2): 1 [drp] via OPHTHALMIC

## 2017-11-06 MED ORDER — ROCURONIUM BROMIDE 100 MG/10ML IV SOLN
INTRAVENOUS | Status: DC | PRN
  Administered 2017-11-06: 50 mg via INTRAVENOUS

## 2017-11-06 MED ORDER — ONDANSETRON HCL 4 MG/2ML IJ SOLN
INTRAMUSCULAR | Status: DC | PRN
  Administered 2017-11-06: 4 mg via INTRAVENOUS

## 2017-11-06 MED ORDER — LIDOCAINE 2% (20 MG/ML) 5 ML SYRINGE
INTRAMUSCULAR | Status: DC | PRN
  Administered 2017-11-06: 100 mg via INTRAVENOUS

## 2017-11-06 MED ORDER — PHENYLEPHRINE HCL 10 % OP SOLN
1.0000 [drp] | OPHTHALMIC | Status: AC | PRN
  Administered 2017-11-06 (×2): 1 [drp] via OPHTHALMIC

## 2017-11-06 MED ORDER — SODIUM CHLORIDE 0.9 % IV SOLN
INTRAVENOUS | Status: DC | PRN
  Administered 2017-11-06 (×2): via INTRAVENOUS

## 2017-11-06 MED ORDER — TROPICAMIDE 1 % OP SOLN
1.0000 [drp] | OPHTHALMIC | Status: DC | PRN
  Administered 2017-11-06: 1 [drp] via OPHTHALMIC
  Filled 2017-11-06: qty 15

## 2017-11-06 MED ORDER — LIDOCAINE HCL 2 % IJ SOLN
INTRAMUSCULAR | Status: DC | PRN
  Administered 2017-11-06: 13:00:00 via RETROBULBAR

## 2017-11-06 MED ORDER — FENTANYL CITRATE (PF) 100 MCG/2ML IJ SOLN
INTRAMUSCULAR | Status: DC | PRN
  Administered 2017-11-06 (×2): 100 ug via INTRAVENOUS
  Administered 2017-11-06: 50 ug via INTRAVENOUS

## 2017-11-06 MED ORDER — FENTANYL CITRATE (PF) 100 MCG/2ML IJ SOLN: 25.0000 ug | INTRAMUSCULAR | Status: DC | PRN

## 2017-11-06 MED ORDER — 0.9 % SODIUM CHLORIDE (POUR BTL) OPTIME
TOPICAL | Status: DC | PRN
  Administered 2017-11-06: 1000 mL

## 2017-11-06 MED ORDER — NEOMYCIN-POLYMYXIN-DEXAMETH 3.5-10000-0.1 OP OINT
TOPICAL_OINTMENT | OPHTHALMIC | Status: AC
  Filled 2017-11-06: qty 3.5

## 2017-11-06 MED ORDER — FENTANYL CITRATE (PF) 250 MCG/5ML IJ SOLN
INTRAMUSCULAR | Status: AC
  Filled 2017-11-06: qty 5

## 2017-11-06 MED ORDER — NEOMYCIN-POLYMYXIN-DEXAMETH 3.5-10000-0.1 OP OINT
TOPICAL_OINTMENT | OPHTHALMIC | Status: DC | PRN
  Administered 2017-11-06: 1 via OPHTHALMIC

## 2017-11-06 MED ORDER — TROPICAMIDE 1 % OP SOLN
1.0000 [drp] | OPHTHALMIC | Status: AC | PRN
  Administered 2017-11-06 (×2): 1 [drp] via OPHTHALMIC

## 2017-11-06 MED ORDER — PROPOFOL 10 MG/ML IV BOLUS
INTRAVENOUS | Status: AC
  Filled 2017-11-06: qty 20

## 2017-11-06 MED ORDER — SUGAMMADEX SODIUM 200 MG/2ML IV SOLN
INTRAVENOUS | Status: DC | PRN
  Administered 2017-11-06: 200 mg via INTRAVENOUS

## 2017-11-06 MED ORDER — ESMOLOL HCL 100 MG/10ML IV SOLN
INTRAVENOUS | Status: AC
  Filled 2017-11-06: qty 10

## 2017-11-06 MED ORDER — PHENYLEPHRINE HCL 10 % OP SOLN
1.0000 [drp] | Freq: Once | OPHTHALMIC | Status: AC
  Administered 2017-11-06: 1 [drp] via OPHTHALMIC
  Filled 2017-11-06: qty 5

## 2017-11-06 MED ORDER — BSS IO SOLN
INTRAOCULAR | Status: AC
  Filled 2017-11-06: qty 15

## 2017-11-06 SURGICAL SUPPLY — 57 items
APL SRG 3 HI ABS STRL LF PLS (MISCELLANEOUS) ×4
APPLICATOR COTTON TIP 6IN STRL (MISCELLANEOUS) ×2 IMPLANT
APPLICATOR DR MATTHEWS STRL (MISCELLANEOUS) ×8 IMPLANT
BLADE MVR KNIFE 19G (BLADE) ×2 IMPLANT
BNDG ADH 5X2 AIR PERM ELC (GAUZE/BANDAGES/DRESSINGS) ×1
BNDG COHESIVE 2X5 WHT NS (GAUZE/BANDAGES/DRESSINGS) ×2 IMPLANT
BNDG GAUZE ELAST 4 BULKY (GAUZE/BANDAGES/DRESSINGS) ×2 IMPLANT
CONFORMER OPHTHALMIC LG W/HOLE (MISCELLANEOUS) ×1 IMPLANT
CORDS BIPOLAR (ELECTRODE) ×1 IMPLANT
DRAIN TLS ROUND 10FR (DRAIN) ×1 IMPLANT
DRAPE HALF SHEET 40X57 (DRAPES) ×2 IMPLANT
DRAPE SLUSH MACHINE 52X66 (DRAPES) ×2 IMPLANT
DRSG TEGADERM 2-3/8X2-3/4 SM (GAUZE/BANDAGES/DRESSINGS) ×2 IMPLANT
GLOVE ECLIPSE 7.5 STRL STRAW (GLOVE) ×2 IMPLANT
GOWN STRL NON-REIN LRG LVL3 (GOWN DISPOSABLE) ×2 IMPLANT
GOWN STRL REUS W/ TWL LRG LVL3 (GOWN DISPOSABLE) ×1 IMPLANT
GOWN STRL REUS W/TWL LRG LVL3 (GOWN DISPOSABLE) ×2
HEMOSTAT SURGICEL 2X14 (HEMOSTASIS) ×2 IMPLANT
IMPL MEDPOR SPHERE EYE 18MM (Ophthalmic Related) IMPLANT
IMPL MEDPOR SPHERE EYE 20 (Ophthalmic Related) ×1 IMPLANT
IMPL MEDPOR SPHERE EYE 20MM (Ophthalmic Related) IMPLANT
IMPLANT MEDPOR SPHERE EYE 18MM (Ophthalmic Related) ×2 IMPLANT
IMPLANT MEDPOR SPHERE EYE 20MM (Ophthalmic Related) ×2 IMPLANT
KIT BASIN OR (CUSTOM PROCEDURE TRAY) ×2 IMPLANT
KIT TURNOVER KIT B (KITS) ×2 IMPLANT
NDL 18GX1X1/2 (RX/OR ONLY) (NEEDLE) ×1 IMPLANT
NDL HYPO 25GX1X1/2 BEV (NEEDLE) ×1 IMPLANT
NEEDLE 18GX1X1/2 (RX/OR ONLY) (NEEDLE) ×2 IMPLANT
NEEDLE HYPO 25GX1X1/2 BEV (NEEDLE) ×2 IMPLANT
NS IRRIG 1000ML POUR BTL (IV SOLUTION) ×2 IMPLANT
PACK CATARACT CUSTOM (CUSTOM PROCEDURE TRAY) ×2 IMPLANT
PAD ARMBOARD 7.5X6 YLW CONV (MISCELLANEOUS) ×4 IMPLANT
PROTECTOR CORNEAL (OPHTHALMIC RELATED) ×2 IMPLANT
SOLUTION ANTI FOG 6CC (MISCELLANEOUS) ×2 IMPLANT
SPECIMEN JAR SMALL (MISCELLANEOUS) ×2 IMPLANT
SUCTION FRAZIER HANDLE 10FR (MISCELLANEOUS) ×1
SUCTION FRAZIER TIP 8 FR DISP (SUCTIONS) ×1
SUCTION TUBE FRAZIER 10FR DISP (MISCELLANEOUS) ×1 IMPLANT
SUCTION TUBE FRAZIER 8FR DISP (SUCTIONS) ×1 IMPLANT
SUT CHROMIC 5 0 P 3 (SUTURE) IMPLANT
SUT MERSILENE 5 0 RD 1 DA (SUTURE) IMPLANT
SUT PROLENE 6 0 C 1 24 (SUTURE) IMPLANT
SUT SILK 2 0 (SUTURE)
SUT SILK 2 0 PERMA HAND 18 BK (SUTURE) ×1 IMPLANT
SUT SILK 2-0 18XBRD TIE 12 (SUTURE) IMPLANT
SUT SILK 4 0 P 3 (SUTURE) IMPLANT
SUT VIC AB 4-0 P-3 18X BRD (SUTURE) IMPLANT
SUT VIC AB 4-0 P3 18 (SUTURE)
SUT VIC AB 4-0 PS2 18 (SUTURE) ×1 IMPLANT
SUT VIC AB 5-0 DAS24 8 (SUTURE) IMPLANT
SUT VICRYL 7 0 TG140 8 (SUTURE) ×1 IMPLANT
SUT VICRYL ABS 6-0 S29 18IN (SUTURE) IMPLANT
SWAB CULTURE ESWAB REG 1ML (MISCELLANEOUS) ×2 IMPLANT
SYR 20CC LL (SYRINGE) ×2 IMPLANT
SYRINGE 20CC LL (MISCELLANEOUS) ×2 IMPLANT
TOWEL OR 17X24 6PK STRL BLUE (TOWEL DISPOSABLE) IMPLANT
WATER STERILE IRR 1000ML POUR (IV SOLUTION) ×2 IMPLANT

## 2017-11-06 NOTE — Anesthesia Procedure Notes (Signed)
Procedure Name: Intubation Date/Time: 11/06/2017 12:27 PM Performed by: Shirlyn Goltz, CRNA Pre-anesthesia Checklist: Patient identified, Emergency Drugs available, Suction available and Patient being monitored Patient Re-evaluated:Patient Re-evaluated prior to induction Oxygen Delivery Method: Circle system utilized Preoxygenation: Pre-oxygenation with 100% oxygen Induction Type: IV induction Ventilation: Mask ventilation without difficulty Laryngoscope Size: Mac and 4 Grade View: Grade I Tube type: Oral Tube size: 7.0 mm Number of attempts: 1 Airway Equipment and Method: Stylet Placement Confirmation: positive ETCO2,  ETT inserted through vocal cords under direct vision and breath sounds checked- equal and bilateral Secured at: 22 cm Tube secured with: Tape Dental Injury: Teeth and Oropharynx as per pre-operative assessment

## 2017-11-06 NOTE — Anesthesia Preprocedure Evaluation (Signed)
Anesthesia Evaluation  Patient identified by MRN, date of birth, ID band Patient awake    Reviewed: Allergy & Precautions, NPO status , Patient's Chart, lab work & pertinent test results  Airway Mallampati: II  TM Distance: >3 FB Neck ROM: Full    Dental no notable dental hx.    Pulmonary Current Smoker,    Pulmonary exam normal breath sounds clear to auscultation       Cardiovascular hypertension, Normal cardiovascular exam Rhythm:Regular Rate:Normal     Neuro/Psych Bipolar Disorder CVA    GI/Hepatic PUD, GERD  ,(+)     substance abuse  alcohol use,   Endo/Other  negative endocrine ROS  Renal/GU negative Renal ROS  negative genitourinary   Musculoskeletal negative musculoskeletal ROS (+)   Abdominal   Peds negative pediatric ROS (+)  Hematology negative hematology ROS (+)   Anesthesia Other Findings   Reproductive/Obstetrics negative OB ROS                             Anesthesia Physical Anesthesia Plan  ASA: III  Anesthesia Plan: General   Post-op Pain Management:    Induction: Intravenous  PONV Risk Score and Plan: 1 and Ondansetron, Dexamethasone and Treatment may vary due to age or medical condition  Airway Management Planned: Oral ETT  Additional Equipment:   Intra-op Plan:   Post-operative Plan: Extubation in OR  Informed Consent: I have reviewed the patients History and Physical, chart, labs and discussed the procedure including the risks, benefits and alternatives for the proposed anesthesia with the patient or authorized representative who has indicated his/her understanding and acceptance.   Dental advisory given  Plan Discussed with: CRNA and Surgeon  Anesthesia Plan Comments:         Anesthesia Quick Evaluation

## 2017-11-06 NOTE — Transfer of Care (Signed)
Immediate Anesthesia Transfer of Care Note  Patient: [name] [name]  Procedure(s) Performed: EVISCERATION REPAIR RIGHT EYE (Right Eye)  Patient Location: PACU  Anesthesia Type:General  Level of Consciousness: awake, alert  and patient cooperative  Airway & Oxygen Therapy: Patient Spontanous Breathing  Post-op Assessment: Report given to RN and Post -op Vital signs reviewed and stable  Post vital signs: Reviewed and stable  Last Vitals:  Vitals Value Taken Time  BP 140/78 11/06/2017  1:54 PM  Temp    Pulse 61 11/06/2017  1:55 PM  Resp 13 11/06/2017  1:55 PM  SpO2 99 % 11/06/2017  1:55 PM  Vitals shown include unvalidated device data.  Last Pain:  Vitals:   11/06/17 1008  TempSrc:   PainSc: 0-No pain         Complications: No apparent anesthesia complications

## 2017-11-06 NOTE — Anesthesia Postprocedure Evaluation (Signed)
Anesthesia Post Note  Patient: [name] [name]  Procedure(s) Performed: EVISCERATION REPAIR RIGHT EYE (Right Eye)     Patient location during evaluation: PACU Anesthesia Type: General Level of consciousness: awake Pain management: pain level controlled Vital Signs Assessment: post-procedure vital signs reviewed and stable Respiratory status: spontaneous breathing Cardiovascular status: stable Postop Assessment: no apparent nausea or vomiting Anesthetic complications: no    Last Vitals:  Vitals:   11/06/17 1524 11/06/17 1526  BP: 124/89   Pulse: (!) 57   Resp: 10   Temp:  36.5 C  SpO2: 94%     Last Pain:  Vitals:   11/06/17 1355  TempSrc:   PainSc: Asleep   Pain Goal:                 Ellamarie Naeve JR,JOHN Herschel Fleagle

## 2017-11-07 ENCOUNTER — Encounter (HOSPITAL_COMMUNITY): Payer: Self-pay | Admitting: Ophthalmology

## 2017-11-08 NOTE — Op Note (Signed)
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  The patient was prepped and draped in the usual fashion for ocular surgery on the left eye. A lid speculum was placed. A 360 peritomy was performed.   Cornoscleral scissors were used to cut out the corneal button along with the iris/ciliary body. Curretes were used to remove the uvea.  The corneoscleral button, IOL and uvea were sent for pathology.  Hemostasis was achieved with endocautery and surgicel. Alcohol soaked cotton swabs were used to remove any remaining uvea.  A cold test tube filled with frozen saline was also used to achieve further hemostasis. The sclera was carefully inspected and noted to be free of uveal tissue. An 18mm implant was placed in the scleral pocket and the sclera closed over and secured with 5-0 Vicryl sutures. A segment of sclera was repositioned over the exposed implant and sutured in place with 7-0 Vicryl.  Tenons was closed over the sclera with 5-0 Vicryl suture. Conjunctiva was closed with a running 6-0 chromic suture.  A Scleral shield was secured in placed with 4-0 nylong suture. Maxitrol ointment was placed through the holes of the scleral shield and in the fornix. The eye was patched with a sterile patch and shield. The patient was revived from general anesthesia and sent to the recovery room in stable condition.    Harrold DonathNarendra Mafabhai Carlie Solorzano MD

## 2017-11-08 NOTE — Brief Op Note (Signed)
11/06/2017  5:17 PM  PATIENT:  [name] [name]  57 y.o. male  PRE-OPERATIVE DIAGNOSIS:  RIGHT EYE INJURY with ruptured globe  POST-OPERATIVE DIAGNOSIS: RIGHT EYE INJURY with ruptured globe  PROCEDURE:  Procedure(s): EVISCERATION RIGHT EYE (Right)  SURGEON:  Surgeon(s) and Role:    * Carmela RimaPatel, Natanel Snavely, MD - Primary  PHYSICIAN ASSISTANT:   ASSISTANTS: none   ANESTHESIA:   local and general  EBL:  15 mL   BLOOD ADMINISTERED:none  DRAINS: none   LOCAL MEDICATIONS USED:  MARCAINE    and LIDOCAINE   SPECIMEN:  Source of Specimen:  right eye evisceration and Excision of corneal button  DISPOSITION OF SPECIMEN:  PATHOLOGY  COUNTS:  YES  TOURNIQUET:  * No tourniquets in log *  DICTATION: .Note written in EPIC  PLAN OF CARE: Discharge to home after PACU  PATIENT DISPOSITION:  PACU - hemodynamically stable.   Delay start of Pharmacological VTE agent (>24hrs) due to surgical blood loss or risk of bleeding: not applicable

## 2017-12-26 ENCOUNTER — Other Ambulatory Visit: Payer: Self-pay | Admitting: Family Medicine

## 2017-12-26 DIAGNOSIS — I10 Essential (primary) hypertension: Secondary | ICD-10-CM

## 2018-01-18 ENCOUNTER — Other Ambulatory Visit: Payer: Self-pay | Admitting: Family Medicine

## 2018-01-18 DIAGNOSIS — I10 Essential (primary) hypertension: Secondary | ICD-10-CM

## 2018-01-28 ENCOUNTER — Telehealth: Payer: Self-pay | Admitting: Family Medicine

## 2018-01-28 NOTE — Telephone Encounter (Signed)
Spoke with pt's wife again to verify update to home phone number as well as briefly discuss overdue COL. Home number has been correctly updated, as it was previously the same as the mobile number (wife's cell phone). -CH

## 2018-01-28 NOTE — Telephone Encounter (Signed)
Both phone numbers under pt's name are wife's mobile. He is at home and she is working. Attempted to discuss COL health maintenance/Cologuard. -CH

## 2018-02-03 NOTE — Telephone Encounter (Signed)
Error

## 2018-02-19 ENCOUNTER — Other Ambulatory Visit: Payer: Self-pay | Admitting: Family Medicine

## 2018-02-19 DIAGNOSIS — I10 Essential (primary) hypertension: Secondary | ICD-10-CM

## 2018-02-21 ENCOUNTER — Other Ambulatory Visit: Payer: Self-pay

## 2018-02-21 ENCOUNTER — Ambulatory Visit: Payer: BLUE CROSS/BLUE SHIELD | Admitting: Family Medicine

## 2018-02-21 ENCOUNTER — Encounter: Payer: Self-pay | Admitting: Family Medicine

## 2018-02-21 VITALS — BP 124/78 | HR 62 | Temp 98.1°F | Wt 244.2 lb

## 2018-02-21 DIAGNOSIS — F17200 Nicotine dependence, unspecified, uncomplicated: Secondary | ICD-10-CM | POA: Diagnosis not present

## 2018-02-21 DIAGNOSIS — Z23 Encounter for immunization: Secondary | ICD-10-CM

## 2018-02-21 DIAGNOSIS — Z1211 Encounter for screening for malignant neoplasm of colon: Secondary | ICD-10-CM

## 2018-02-21 DIAGNOSIS — N529 Male erectile dysfunction, unspecified: Secondary | ICD-10-CM

## 2018-02-21 HISTORY — DX: Male erectile dysfunction, unspecified: N52.9

## 2018-02-21 NOTE — Progress Notes (Signed)
    Subjective:  [name] [name] is a 57 y.o. male who presents to the [facility] today with a chief complaint of medication refill and erectile dysfunction.   HPI: Patient said he is feeling well after his eye surgery and is following up with Dr. Allena [name].  He says Dr. Allena [name] reports no undue complications.  No review seems to confirm this.  We also discussed his request for medication refills.  He brings in his medication which all seems to be filled at this time.  He says that he "just wanted to make sure I knew what meds he was on "and it does not appear that he actually needs medicine.  We discussed the format of having pharmacy request refills electronically and patient seems to understand.  As couple had elected to come in for a joint visit, they did discuss that they are no longer sexually active and his wife stated that is because he did not want to be.  When I asked the patient he said that he was interested but that was having trouble obtaining an erection.  He said this was going on for years.  He denies any specific injury and said that he would still be interested if he thought that he could physically perform.  He did request a referral to urology when I asked him if he wanted to talk to someone about erectile dysfunction.  There was concern about his heart history and taking sildenafil  We did discuss his continuing history of smoking and that stopping smoking would be significant good for his health and vascular system which does impact erectile dysfunction  Objective:  Physical Exam: BP 124/78   Pulse 62   Temp 98.1 F (36.7 C) (Oral)   Wt 244 lb 3.2 oz (110.8 kg)   SpO2 95%   BMI 33.12 kg/m   Gen: NAD, resting comfortably, obese, walks with cane CV: RRR with no murmurs appreciated Pulm: NWOB, CTAB with no crackles, wheezes, or rhonchi, coarse lung sounds GI: Normal bowel sounds present. Soft, Nontender, Nondistended. MSK: no edema, cyanosis, or clubbing noted Skin: warm,  dry Neuro: grossly normal, moves all extremities Psych: Normal affect and thought content  No results found for this or any previous visit (from the past 72 hour(s)).   Assessment/Plan:  Special screening for malignant neoplasms, colon Patient overdue for colon cancer screening, no symptoms at this time  Shared decision making and patient decides to use Cologuard screening instead of colonoscopy  Need for immunization against influenza Patient consents to flu shot  Erectile dysfunction Patient his wife notes had not been having sex for years while you are talking about STD screening.  Patient says that he is interested but has not been able to obtain an erection so he stopped initiating physical intimacy with his wife.  When I noted this could be a physical problem that might have a solution patient requested referral to urology because he says he is still interested but just thought that he would be physically unable.   [name] RollingScott Bannie Lobban, DO FAMILY MEDICINE RESIDENT - PGY2 02/21/2018 11:25 AM

## 2018-02-21 NOTE — Patient Instructions (Signed)
It was a pleasure to see you today! Thank you for choosing [facility] for your primary care. [name] [name] was seen for medication review erectile dysfunction. Come back to the clinic if he has any routine needs, and go to the emergency room if you have any emergency symptoms.   1. Today we gave you a flu shot and discussed your concerns about erectile dysfunction.  At your request we have placed a referral to urology as she seemed to have interest but are having physical trouble obtaining an erection.  Given your heart history, I did not want to start you on sildenafil without consulting with specialist.  If you don't hear from us in two weeks, please give us a call to verify your results. Otherwise, we look forward to seeing you again at your next visit. If you have any questions or concerns before then, please call the clinic at [phone].   Please bring all your medications to every doctors visit   Sign up for My Chart to have easy access to your labs results, and communication with your Primary care physician.     Please check-out at the front desk before leaving the clinic.     Best,  Dr. Marthenia RollingScott Alfrieda Tarry FAMILY MEDICINE RESIDENT - PGY2 02/21/2018 10:46 AM

## 2018-02-21 NOTE — Assessment & Plan Note (Signed)
Patient his wife notes had not been having sex for years while you are talking about STD screening.  Patient says that he is interested but has not been able to obtain an erection so he stopped initiating physical intimacy with his wife.  When I noted this could be a physical problem that might have a solution patient requested referral to urology because he says he is still interested but just thought that he would be physically unable.

## 2018-02-21 NOTE — Assessment & Plan Note (Signed)
Patient overdue for colon cancer screening, no symptoms at this time  Shared decision making and patient decides to use Cologuard screening instead of colonoscopy

## 2018-02-21 NOTE — Assessment & Plan Note (Signed)
Patient consents to flu shot 

## 2018-02-21 NOTE — Assessment & Plan Note (Signed)
Patient still using nicotine patches, still smoking, declines request for additional medicinal assistance and or counseling  He does note that this could have an impact on erectile dysfunction

## 2018-02-24 ENCOUNTER — Other Ambulatory Visit: Payer: Self-pay

## 2018-02-24 DIAGNOSIS — K7682 Hepatic encephalopathy: Secondary | ICD-10-CM

## 2018-02-24 DIAGNOSIS — K7290 Hepatic failure, unspecified without coma: Secondary | ICD-10-CM

## 2018-02-24 MED ORDER — OMEGA-3-ACID ETHYL ESTERS 1 G PO CAPS: ORAL_CAPSULE | ORAL | 11 refills | Status: DC

## 2018-02-24 MED ORDER — LACTULOSE 10 GM/15ML PO SOLN: 30.0000 g | Freq: Every day | ORAL | 3 refills | Status: AC | PRN

## 2018-02-24 MED ORDER — FOLIC ACID 1 MG PO TABS: ORAL_TABLET | ORAL | 11 refills | Status: DC

## 2018-03-14 ENCOUNTER — Other Ambulatory Visit: Payer: Self-pay | Admitting: Family Medicine

## 2018-03-14 DIAGNOSIS — I10 Essential (primary) hypertension: Secondary | ICD-10-CM

## 2018-05-05 ENCOUNTER — Other Ambulatory Visit (HOSPITAL_COMMUNITY): Payer: Self-pay | Admitting: Psychiatry

## 2018-05-06 ENCOUNTER — Telehealth: Payer: Self-pay | Admitting: *Deleted

## 2018-05-06 NOTE — Telephone Encounter (Signed)
-----   Message from Marthenia Rolling, DO sent at 05/05/2018  7:08 PM EDT ----- Please call and remind about cologuard. -Dr. Parke Simmers

## 2018-05-06 NOTE — Telephone Encounter (Signed)
Message sent to lab to be sure this was ordered. If it has been ordered Cologuard will contact pt. [name] [name], [name], [place]

## 2018-05-07 NOTE — Telephone Encounter (Signed)
Called home number to inform pt of below and lady answered and stated that this was the wrong number, will remove from chart.  Called Mobile number and VM has not been set up, if he calls back please remind him of completing his Cologuard. Lamonte Sakai, April D, [place]

## 2018-06-06 ENCOUNTER — Other Ambulatory Visit: Payer: Self-pay | Admitting: Family Medicine

## 2018-06-06 DIAGNOSIS — I10 Essential (primary) hypertension: Secondary | ICD-10-CM

## 2018-07-03 ENCOUNTER — Other Ambulatory Visit: Payer: Self-pay | Admitting: Family Medicine

## 2018-07-03 DIAGNOSIS — I10 Essential (primary) hypertension: Secondary | ICD-10-CM

## 2018-07-14 ENCOUNTER — Encounter: Payer: Self-pay | Admitting: Family Medicine

## 2018-07-14 ENCOUNTER — Ambulatory Visit: Payer: BLUE CROSS/BLUE SHIELD | Admitting: Family Medicine

## 2018-07-14 ENCOUNTER — Other Ambulatory Visit: Payer: Self-pay

## 2018-07-14 VITALS — BP 132/84 | HR 74

## 2018-07-14 DIAGNOSIS — N39498 Other specified urinary incontinence: Secondary | ICD-10-CM | POA: Diagnosis not present

## 2018-07-14 DIAGNOSIS — Z8673 Personal history of transient ischemic attack (TIA), and cerebral infarction without residual deficits: Secondary | ICD-10-CM

## 2018-07-14 DIAGNOSIS — R269 Unspecified abnormalities of gait and mobility: Secondary | ICD-10-CM

## 2018-07-14 DIAGNOSIS — R4181 Age-related cognitive decline: Secondary | ICD-10-CM

## 2018-07-14 DIAGNOSIS — R296 Repeated falls: Secondary | ICD-10-CM

## 2018-07-14 NOTE — Patient Instructions (Signed)
It was a pleasure to see you today! Thank you for choosing [facility] for your primary care. [name] [name] was seen for frequent falls. Come back to the clinic if you have any immediate concerns, we're going to schedule you for our geriatric clinic to help with followup.  Thank you for coming in today, today we ordered an MRI.  The goal of this is to try and get a better idea of what is causing your consistent decline in status and frequent falls.  It may be that we did not find a resolvable cause for this.  We have also ordered you a wheelchair which I think you are going to need to use to get around safely, and a home health physical therapy evaluation in order for you to try and work on your physical strength and ability to walk.  If you don't hear from [place] in two weeks, please give [place] a call to verify your results. Otherwise, we look forward to seeing you again at your next visit. If you have any questions or concerns before then, please call the clinic at [phone].   Please bring all your medications to every doctors visit   Sign up for My Chart to have easy access to your labs results, and communication with your Primary care physician.     Please check-out at the front desk before leaving the clinic.     Best,  Dr. Marthenia Rolling FAMILY MEDICINE RESIDENT - PGY2 07/14/2018 4:44 PM

## 2018-07-17 ENCOUNTER — Telehealth: Payer: Self-pay | Admitting: *Deleted

## 2018-07-17 ENCOUNTER — Other Ambulatory Visit: Payer: Self-pay

## 2018-07-17 ENCOUNTER — Ambulatory Visit
Admission: RE | Admit: 2018-07-17 | Discharge: 2018-07-17 | Disposition: A | Discharge status: Home / Self Care | Payer: BLUE CROSS/BLUE SHIELD | Source: Ambulatory Visit | Attending: Family Medicine | Admitting: Family Medicine

## 2018-07-17 DIAGNOSIS — I639 Cerebral infarction, unspecified: Secondary | ICD-10-CM

## 2018-07-17 DIAGNOSIS — R296 Repeated falls: Secondary | ICD-10-CM | POA: Insufficient documentation

## 2018-07-17 DIAGNOSIS — F0390 Unspecified dementia without behavioral disturbance: Secondary | ICD-10-CM | POA: Insufficient documentation

## 2018-07-17 DIAGNOSIS — R4181 Age-related cognitive decline: Secondary | ICD-10-CM

## 2018-07-17 HISTORY — DX: Unspecified dementia, unspecified severity, without behavioral disturbance, psychotic disturbance, mood disturbance, and anxiety: F03.90

## 2018-07-17 HISTORY — DX: Cerebral infarction, unspecified: I63.9

## 2018-07-17 MED ORDER — GADOBENATE DIMEGLUMINE 529 MG/ML IV SOLN
20.0000 mL | Freq: Once | INTRAVENOUS | Status: AC | PRN
  Administered 2018-07-17: 20 mL via INTRAVENOUS

## 2018-07-17 NOTE — Assessment & Plan Note (Addendum)
Patient with history of stroke, with continuing worsening of his cognitive status.  Still ANO x3 but now with slowed speech and slow thought processes.  Will get repeat MRI to see if there is new acute in treatable injury.  Home health/PT referral

## 2018-07-17 NOTE — Assessment & Plan Note (Signed)
Slow and unsteady gait, increasing amount of falls now to almost daily per family.  Has been using a cane does not have a walker.  We are to be ordering home PT evaluation as well as wheelchair.  No specific new injury to these falls since his last ED visit for loss of an eye.

## 2018-07-17 NOTE — Assessment & Plan Note (Signed)
Patient with multiple falls around the house.  This is becoming almost daily occurrence per wife and daughter.  House does not have rails has not been evaluated for fall risk.  Patient has not followed with physical therapy currently.  He uses a cane but has weak arm strength for support and has blindness in 1 eye.  Did not report any significant injury from these falls after the last emergency department visit during which he lost an eye.  Very open to physical therapy for evaluation and treatment

## 2018-07-17 NOTE — Assessment & Plan Note (Addendum)
Worsening incontinence along with continued progression of cognitive decline.  No rashes or wounds at this time.  We will be scheduling home health evaluations and evaluating for potential placement as well as scheduling for geriatric clinic

## 2018-07-17 NOTE — Progress Notes (Signed)
Subjective:  [name] [name] is a 58 y.o. male who presents to the [facility] today with a chief complaint of progressive cognitive decline.   HPI: Patient presents with his wife and his daughter who are his primary caretakers at home.  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  They said that his falls have become more frequent, that he is less and less steady in his ambulation, and that he has slowly developing incontinence as well.  They said that his cognition is decreasing and that his care is becoming more they think that they can handle on their own.  In discussing with the patient he is having some clear cognitive deficits although he is pleasant and says that he does not think that he is having any changes in his thought processes.  He does admit to recent falls, trouble walking, incontinence.  He says his family takes good care of him and he is open to getting some extra help because he knows it is becoming a lot for them.  He is speaking in short sentences.  He has multiple superficial lesions on his arms and some minor bruising but no significant injury is claimed.  Objective:  Physical Exam: BP 132/84   Pulse 74   SpO2 95%   Gen: Slow speaking, insecure in gait, but not in acute distress *Blind in right eye CV: RRR with no murmurs appreciated Pulm: NWOB, CTAB with no crackles, wheezes, or rhonchi GI: Normal bowel sounds present. Soft, Nontender, Nondistended. MSK: Very weak and shaky upon standing.  He was unable to stand without bracing himself on the cabinet and using a cane.  We offered a wheelchair for him to leave because he did not think that he can make a walker with a car Skin: warm, dry Neuro: Slowed and very intentional movements, with some intention tremor.  Muscle strength is decreased from prior visits universally with some left-sided asymmetry that he says is chronic. Psych: AO x3, delayed thought process,  slowed speech  No results found for this or any previous visit (from the past 72 hour(s)).   Assessment/Plan:  Age-related cognitive decline Patient with history of stroke, with continuing worsening of his cognitive status.  Still ANO x3 but now with slowed speech and slow thought processes.  Will get repeat MRI to see if there is new acute in treatable injury.  Home health/PT referral  Frequent falls Patient with multiple falls around the house.  This is becoming almost daily occurrence per wife and daughter.  House does not have rails has not been evaluated for fall risk.  Patient has not followed with physical therapy currently.  He uses a cane but has weak arm strength for support and has blindness in 1 eye.  Did not report any significant injury from these falls after the last emergency department visit during which he lost an eye.  Very open to physical therapy for evaluation and treatment  Total incontinence Worsening incontinence along with continued progression of cognitive decline.  No rashes or wounds at this time.  We will be scheduling home health evaluations and evaluating for potential placement as well as scheduling for geriatric clinic  Abnormality of gait Slow and unsteady gait, increasing amount of falls now to almost daily per family.  Has been using a cane does not have a walker.  We are to be ordering home PT evaluation as well as wheelchair.  No specific new injury to these falls since his last ED visit for loss of an eye.   Marthenia RollingScott Jiyan Walkowski, DO FAMILY MEDICINE RESIDENT - PGY2 07/17/2018 9:42 AM

## 2018-07-17 NOTE — Telephone Encounter (Addendum)
[facility] called and needed more info [facility] Certified provider) before they could initiate care.  Provider faculty Dr. Manson Passey for orders.  They will also need a qualifying Dx code for home health ( basically a reason for his falls - recent stroke etc)  You can call [place] @ [phone] to discuss. Jone Baseman, CMA

## 2018-07-19 NOTE — Telephone Encounter (Signed)
Qualifying diagnosis- I67.9- Cerebrovascular disease. Let us know if they need more descriptive diagnoses.   Terisa Starr, MD  Family Medicine Teaching Service

## 2018-07-22 NOTE — Telephone Encounter (Signed)
LMOVM for caroline for callback. Jone Baseman, CMA

## 2018-07-24 ENCOUNTER — Ambulatory Visit: Payer: BLUE CROSS/BLUE SHIELD | Admitting: Family Medicine

## 2018-07-24 ENCOUNTER — Other Ambulatory Visit: Payer: Self-pay

## 2018-07-24 ENCOUNTER — Encounter: Payer: Self-pay | Admitting: Licensed Clinical Social Worker

## 2018-07-24 ENCOUNTER — Telehealth: Payer: Self-pay | Admitting: *Deleted

## 2018-07-24 ENCOUNTER — Ambulatory Visit (INDEPENDENT_AMBULATORY_CARE_PROVIDER_SITE_OTHER): Payer: BLUE CROSS/BLUE SHIELD | Admitting: Licensed Clinical Social Worker

## 2018-07-24 VITALS — BP 124/90 | Temp 97.3°F

## 2018-07-24 DIAGNOSIS — M25561 Pain in right knee: Secondary | ICD-10-CM

## 2018-07-24 DIAGNOSIS — G8929 Other chronic pain: Secondary | ICD-10-CM

## 2018-07-24 DIAGNOSIS — G9389 Other specified disorders of brain: Secondary | ICD-10-CM

## 2018-07-24 DIAGNOSIS — Z8719 Personal history of other diseases of the digestive system: Secondary | ICD-10-CM

## 2018-07-24 DIAGNOSIS — Z7409 Other reduced mobility: Secondary | ICD-10-CM

## 2018-07-24 DIAGNOSIS — Z139 Encounter for screening, unspecified: Secondary | ICD-10-CM

## 2018-07-24 DIAGNOSIS — F0150 Vascular dementia without behavioral disturbance: Secondary | ICD-10-CM

## 2018-07-24 DIAGNOSIS — R634 Abnormal weight loss: Secondary | ICD-10-CM

## 2018-07-24 DIAGNOSIS — I63512 Cerebral infarction due to unspecified occlusion or stenosis of left middle cerebral artery: Secondary | ICD-10-CM | POA: Diagnosis not present

## 2018-07-24 DIAGNOSIS — Z8673 Personal history of transient ischemic attack (TIA), and cerebral infarction without residual deficits: Secondary | ICD-10-CM

## 2018-07-24 DIAGNOSIS — F1020 Alcohol dependence, uncomplicated: Secondary | ICD-10-CM

## 2018-07-24 DIAGNOSIS — I10 Essential (primary) hypertension: Secondary | ICD-10-CM

## 2018-07-24 DIAGNOSIS — N2889 Other specified disorders of kidney and ureter: Secondary | ICD-10-CM

## 2018-07-24 DIAGNOSIS — F3162 Bipolar disorder, current episode mixed, moderate: Secondary | ICD-10-CM | POA: Diagnosis not present

## 2018-07-24 DIAGNOSIS — Z789 Other specified health status: Secondary | ICD-10-CM

## 2018-07-24 DIAGNOSIS — H547 Unspecified visual loss: Secondary | ICD-10-CM

## 2018-07-24 DIAGNOSIS — Z87820 Personal history of traumatic brain injury: Secondary | ICD-10-CM | POA: Diagnosis not present

## 2018-07-24 DIAGNOSIS — F17200 Nicotine dependence, unspecified, uncomplicated: Secondary | ICD-10-CM

## 2018-07-24 DIAGNOSIS — R296 Repeated falls: Secondary | ICD-10-CM

## 2018-07-24 DIAGNOSIS — E7800 Pure hypercholesterolemia, unspecified: Secondary | ICD-10-CM

## 2018-07-24 DIAGNOSIS — R4189 Other symptoms and signs involving cognitive functions and awareness: Secondary | ICD-10-CM

## 2018-07-24 NOTE — Progress Notes (Addendum)
Patient was seen with the interprofessional geriatrics assessment clinic. All medication bottles were brought to clinic visit. Reviewed and reconciled medications between discussing with patient's wife and reviewing fill history at [facility] pharmacy, along with Harlow Mares, PGY1 Pharmacy Resident.  Summary of findings  [name] reports lithium was discontinued, patient currently taking fluoxetine 40 mg daily.  Can consider increasing atorvastatin from 40 mg to 80 mg due to recent stroke and last LDL level in 2018 > 100. Discussed with Dr. Perley Jain, prescription sent to [facility] with message to pharmacist to note dose change.  Clarified patient is currently taking aspirin 81 mg daily, discontinue aspirin and initiate clopidogrel (note sent with clopidogrel prescription for [facility] pharmacist to assist with transition as well).  Brief smoking cessation counseling provided, wife states patient is continuing to work on smoking cessation, but sometimes does not wear the nicotine patch. She states patient has tried other pharmacotherapy agents for smoking cessation in the past but developed a severe rash. She is open to [facility]uit[facility] referral for resources and further support.  Advised wife to discontinue diphenhydramine-acetaminophen due to falls risk, advised on plain acetaminophen over the counter if needed.  Also advised to discontinue ibuprofen and recommended diclofenac gel instead.  We noted some medications had more than 1 bottle and were full of tablets with lapsed fill dates (e.g. October 2019) and discussed with family. Wife states that medications have accrued over time due to consistent refills.  Advised family to contact clinic if any concerns and provided a pill box today.  Quitline [place] Referral: Organization: [facility]: [place] Zip code: [id number]  In order to receive a participant's Outcome Report, you must be a HIPAA-Covered Entity __X_ Yes, we are a  HIPAA-Covered Entity _____ Yes, I would like to receive an Outcome Report  Provider: Amada Kingfisher Fax: [phone]  Phone: [phone]   Person being referred to [facility]:  Name: Leda Gauze DOB: 10/18/1960  [address]  [place] [address] [phone] (M)  Gender: Male Pregnant: no  Language preference: English  _YES_ I am ready to quit tobacco use within the next 30 days or have recently quit. I request [facility] to contact me to help me with my quit plan.  ____ I DO NOT give permission to [facility] to leave a message when contacting me.  Signature: Patient gave verbal permission to provider  Date: 07/25/2018   Check the BEST time for [facility] to call:  - 3pm - 6pm

## 2018-07-24 NOTE — BH Specialist Note (Signed)
Type of Service: Clinical Social Work Special educational needs teacher time: 4:00   End time: 4:20 Total time: 20 minutes  Demographics [name] [name] is a 58 y.o. male  referred by Dr. McDiarmid for assessing social needs and support.  Patient  was accompanied by his wife and daughter who provided all of the information. Reports :no concerns with social needs today. no barriers to care identified.  Family/Social Information patient lives with his wife as primary caretaker ,Daughter is also part of support system. Transportation to appointments provided by wife. no Financial concerns. Receives the following services social security disability, is applying for Medicaid. Patient enjoys watching TV and drinks beer weekly.  Last drank alcohol 2 weeks ago. History of drinking alcohol for 30 years. Caregiving Needs primary caregiver? wife.  Completed Caregiver stress assessment with a score of 4 indication of little to no stress. Health status of caregiver:  good Physical Limitations:  None identified  Advance Directives: Living Will or Medical POA? No , LCSW. reviewed and provided Legal resources to patient, daughter and wife. Durable POA? No. ; Is it on file at [facility] No. LCSW. reviewed Legal resources.   Interventions :   Solution-Focused Strategies,  Problem-solving teaching/coping strategies and Psychoeducation  as well as SDOH (Social Determinants of Health) screening performed related to challenges with: None Clinical Impressions/Recommendations :No psychosocial stressors or barriers identified today. Patient has HH services with social work who is working with family to complete advance directives and Medicaid application. Family is very supportive of patient and is not interested in facility placement at this time. All mental health needs are managed by [facility].  Educational information offered to patient/family/caregiver:  1. Living Will and Medical POA,   2. 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 Navigating the Long Term Care System Plan:    1. Patient;s wife will  Complete Advance Directives  2. Wife will work with [facility] Social Worker for ongoing needs.  [name] Hines, LCSW [facility]   [phone] 5:13 PM

## 2018-07-24 NOTE — Telephone Encounter (Signed)
Kelsi from [facility] calling for ST verbal orders as follows:  1 time(s) weekly for 1 week(s), then 2 time(s) weekly for 2 week(s), then 1 time(s) weekly for 1 week(s)  You can leave verbal orders on confidential voicemail.  Jone Baseman, CMA

## 2018-07-24 NOTE — Assessment & Plan Note (Signed)
Duodenal ulcer on EGD (DR Buccini) 2016

## 2018-07-24 NOTE — Patient Instructions (Addendum)
Thank you for visiting the Geriatrics Interprofessional Assessment Clinic [name] [name] has dementia and has had a recent stroke We will arrange speech therapy, echocardiogram, carotid artery dopplers Discontinue aspirin, start clopidogrel Increase atorvastatin to 80 mg daily Will arrange for you to come by at your convenience for blood work Instead of ibuprofen, can try diclofenac gel over the counter (will also send a prescription to help), can try plain Tylenol over the counter for pain as well

## 2018-07-25 ENCOUNTER — Encounter: Payer: Self-pay | Admitting: Family Medicine

## 2018-07-25 DIAGNOSIS — F32A Depression, unspecified: Secondary | ICD-10-CM | POA: Insufficient documentation

## 2018-07-25 DIAGNOSIS — H547 Unspecified visual loss: Secondary | ICD-10-CM | POA: Insufficient documentation

## 2018-07-25 DIAGNOSIS — E7800 Pure hypercholesterolemia, unspecified: Secondary | ICD-10-CM

## 2018-07-25 DIAGNOSIS — F329 Major depressive disorder, single episode, unspecified: Secondary | ICD-10-CM | POA: Insufficient documentation

## 2018-07-25 DIAGNOSIS — Z789 Other specified health status: Secondary | ICD-10-CM | POA: Insufficient documentation

## 2018-07-25 DIAGNOSIS — N2889 Other specified disorders of kidney and ureter: Secondary | ICD-10-CM | POA: Insufficient documentation

## 2018-07-25 DIAGNOSIS — R634 Abnormal weight loss: Secondary | ICD-10-CM | POA: Insufficient documentation

## 2018-07-25 DIAGNOSIS — Z7409 Other reduced mobility: Secondary | ICD-10-CM | POA: Insufficient documentation

## 2018-07-25 HISTORY — DX: Pure hypercholesterolemia, unspecified: E78.00

## 2018-07-25 MED ORDER — ATORVASTATIN CALCIUM 80 MG PO TABS: 80.0000 mg | ORAL_TABLET | Freq: Every day | ORAL | 11 refills | Status: DC

## 2018-07-25 MED ORDER — DICLOFENAC SODIUM 1 % TD GEL: 2.0000 g | Freq: Four times a day (QID) | TRANSDERMAL | 3 refills | Status: AC

## 2018-07-25 MED ORDER — CLOPIDOGREL BISULFATE 75 MG PO TABS: 75.0000 mg | ORAL_TABLET | Freq: Every day | ORAL | 3 refills | Status: DC

## 2018-07-25 NOTE — Assessment & Plan Note (Addendum)
Unable find that this incidental CT Renal mass finding was followed up as outpatient.    CT AP 11/2014: 2.2 cm diameter indeterminate mass in the midpole right kidney. Recommend followup with elective MRI to evaluate for solid mass versus hemorrhagic cyst.  Recommend that CT with contrast to see if right renal lesion is still present.

## 2018-07-25 NOTE — Assessment & Plan Note (Signed)
Established problem Underlying source of cognitive and activities impairment

## 2018-07-25 NOTE — Progress Notes (Signed)
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 team consisted of representatives from medicine, pharmacy, and social work. The interdisciplinary team meet with the patient and caretakers to review the team's findings, assessments, and recommendations.

## 2018-07-25 NOTE — Assessment & Plan Note (Signed)
Established problem Uncontrolled Insufficient time to address this visit, but addressing it has acquired a new urgency given the recent acute cerebral infarct.  The patient has used nicotine patches in past which were helpful, but did not help when he had strong urges. Recommend considering the combination of nicotine patches with oral nicotine lozenges prn for nicotine urge control.

## 2018-07-25 NOTE — Assessment & Plan Note (Signed)
Established problem Controlled Continue current therapy regiment.  

## 2018-07-25 NOTE — Assessment & Plan Note (Signed)
Established problem. Stable. Continue current therapy with SSRI. Patient is followed by Psychiatry, Dr Reece Agar. [place].

## 2018-07-25 NOTE — Assessment & Plan Note (Signed)
Established problem Uncontrolled Patient continues to drink alcohol

## 2018-07-25 NOTE — Assessment & Plan Note (Addendum)
Recent worsening of stance & gait stability likely from acute cerebral infarction. Patient receiving home PT at this time.   Medical Necessity for Wheelchair 1. Does patient has mobility limitations which cannot be resolved with a cane, crutch or walker: Yes 2.  Patient requires and uses a wheelchair to move around in their residence (Yes/No): Yes  2.  Patient has quadriplegia or a fixed hip angle or excessive extensor tone of the trunk muscles or a trunk brace or need to rest in a recumbent position two or more times during the day (Yes/No): No  3.  Patient has a cast, brace or musculoskeletal condition which prevents 90 degree flexion of the knee, or the patient has significant edema of the lower extremity that requires a elevated leg rest, or is a reclining back ordered (Yes/No): No  4. Patient has a need for wheelchair arm height different than that available using non-adjustable wheelchair arms (Yes/No): No  5.  How many hours per day does the patient spend in the wheel chair (1-24): 8 hours  6. Patient has severe weakness of upper extremities due to neurologic, muscular or cardiopulmonary disease / condition (Yes/No): No  7.  Patient is unable to operate any type of manual wheelchair (Yes/No): No.  Mr Danzig Helmkamp is able to operate a manual wheelchair safely. -------------------------------------------------------------------------------------------------------------------  Patient can safely self propel a wheelchair or has a caregiver who can assist: Yes.   EST. LENGTH OF NEED (# OF MONTHS): __99____.  Is patient likely to improve such that they would not require a manual wheelchair: No 1-99 (99=LIFETIME) DIAGNOSIS CODES (ICD-9): _Z74.09________ ___R26.9______ ___Z87.820______ ___Z86.73______ Body weight: 243 lbs Body height: 72 inches
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
## 2018-07-25 NOTE — Assessment & Plan Note (Signed)
New problem  Need to document weight at next office visit Investigate 11/2014 CT AP renal mass incidentaloma Consider role of Fluoxetine in weight loss in older adults Consider role of recent acute CVA with dysarthria in decreased oral intake Alcohol abuse disorder can result in weight loss.  Assess amount of alcohol intake with patient and wife next ov Possible contribution from mood disorder Contribution from dementia and frailty

## 2018-07-25 NOTE — Assessment & Plan Note (Signed)
Established problem Increase atorvastatin to 80 mg daily given recurrent strokes.

## 2018-07-25 NOTE — Telephone Encounter (Signed)
Verbal orders given -Dr. Parke Simmers
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
## 2018-07-25 NOTE — Telephone Encounter (Signed)
Caroline informed.  That Dx code does work. Jone Baseman, CMA

## 2018-07-25 NOTE — Assessment & Plan Note (Addendum)
New problem requiring further work up Cerebral infarct found on 05/17/18 brain MRI            - Correlates with recent acute decline in cognitive and physical function            - Correlates with new dysarthria and right facial droop            - Occurred while taking daily aspirin 81 mg              - Recommend: TTE, Carotid AA dopplers, increase atorvastatin to 80 mg daily, stop Aspirin, start clopidogrel 75 mg daily, ambulatory referral for speech therapy.  [name] [name] is currently receiving home health PT

## 2018-07-28 ENCOUNTER — Ambulatory Visit (HOSPITAL_COMMUNITY): Payer: BLUE CROSS/BLUE SHIELD

## 2018-07-30 ENCOUNTER — Telehealth: Payer: Self-pay | Admitting: *Deleted

## 2018-07-30 NOTE — Telephone Encounter (Signed)
Social worker, Debby Bud, is requesting we send an order for a shower chair to [facility]. Jone Baseman, CMA

## 2018-07-31 ENCOUNTER — Other Ambulatory Visit: Payer: Self-pay | Admitting: Family Medicine

## 2018-07-31 ENCOUNTER — Ambulatory Visit (HOSPITAL_COMMUNITY)
Admission: RE | Admit: 2018-07-31 | Discharge: 2018-07-31 | Disposition: A | Discharge status: Home / Self Care | Payer: BLUE CROSS/BLUE SHIELD | Source: Ambulatory Visit | Attending: Family Medicine | Admitting: Family Medicine

## 2018-07-31 ENCOUNTER — Other Ambulatory Visit: Payer: Self-pay

## 2018-07-31 DIAGNOSIS — K219 Gastro-esophageal reflux disease without esophagitis: Secondary | ICD-10-CM | POA: Insufficient documentation

## 2018-07-31 DIAGNOSIS — I63512 Cerebral infarction due to unspecified occlusion or stenosis of left middle cerebral artery: Secondary | ICD-10-CM | POA: Diagnosis not present

## 2018-07-31 DIAGNOSIS — I10 Essential (primary) hypertension: Secondary | ICD-10-CM | POA: Insufficient documentation

## 2018-07-31 DIAGNOSIS — F17200 Nicotine dependence, unspecified, uncomplicated: Secondary | ICD-10-CM | POA: Insufficient documentation

## 2018-07-31 DIAGNOSIS — E785 Hyperlipidemia, unspecified: Secondary | ICD-10-CM | POA: Diagnosis not present

## 2018-07-31 NOTE — Telephone Encounter (Signed)
Andreas Blower, do you know how I would go about this?  I usually put the order in and message Henderson Newcomer for DME orders, but I thought she works with [facility] instead of [facility].

## 2018-07-31 NOTE — Progress Notes (Signed)
  Echocardiogram 2D Echocardiogram has been performed.  Katsumi Wisler G Zakhari Fogel 07/31/2018, 5:03 PM

## 2018-07-31 NOTE — Progress Notes (Signed)
Entered in error

## 2018-08-01 ENCOUNTER — Other Ambulatory Visit: Payer: Self-pay | Admitting: Family Medicine

## 2018-08-01 DIAGNOSIS — R269 Unspecified abnormalities of gait and mobility: Secondary | ICD-10-CM

## 2018-08-01 NOTE — Telephone Encounter (Signed)
Hey Dr. Frances Furbish,   [facility] is the DME portion of [facility].  Jone Baseman, CMA

## 2018-08-01 NOTE — Telephone Encounter (Signed)
I sent a message to [employer] today and placed the order.  Thanks!

## 2018-08-04 ENCOUNTER — Telehealth: Payer: Self-pay

## 2018-08-04 DIAGNOSIS — Z87820 Personal history of traumatic brain injury: Secondary | ICD-10-CM

## 2018-08-04 DIAGNOSIS — F0150 Vascular dementia without behavioral disturbance: Secondary | ICD-10-CM

## 2018-08-04 NOTE — Telephone Encounter (Signed)
Jalene Mullet, patients physical therapist, called nurse line requesting a DME order for a wheelchair. Jalene Mullet stated just a regular wheelchair with regular foot rest, something he can use to get around the house better, since he spends most of his time alone.

## 2018-08-05 NOTE — Telephone Encounter (Signed)
Wheelchair ordered.  -Dr. Criss Rosales

## 2018-08-05 NOTE — Telephone Encounter (Signed)
Faxed to [facility]

## 2018-08-06 ENCOUNTER — Telehealth: Payer: Self-pay

## 2018-08-06 DIAGNOSIS — I5022 Chronic systolic (congestive) heart failure: Secondary | ICD-10-CM

## 2018-08-06 NOTE — Telephone Encounter (Signed)
Galina, patients home PT, called nurse line to report patients HR after activities went up to 138. Jalene Mullet stated he was sweaty, as the air in the home was not on, and he appeared dehydrated. Jalene Mullet provided him with some water and when she left his HR was 103.

## 2018-08-08 ENCOUNTER — Encounter: Payer: Self-pay | Admitting: Family Medicine

## 2018-08-08 DIAGNOSIS — I519 Heart disease, unspecified: Secondary | ICD-10-CM | POA: Insufficient documentation

## 2018-08-08 MED ORDER — ISOSORBIDE DINITRATE 20 MG PO TABS: 20.0000 mg | ORAL_TABLET | Freq: Three times a day (TID) | ORAL | 5 refills | Status: DC

## 2018-08-08 MED ORDER — HYDRALAZINE HCL 10 MG PO TABS: 10.0000 mg | ORAL_TABLET | Freq: Three times a day (TID) | ORAL | 5 refills | Status: DC

## 2018-08-08 NOTE — Telephone Encounter (Signed)
I spoke with [name]' wife, [name] [name] by mobile phone at her place of work.  We talked about [name]' reduced ejection fraction on echocardiogram. We talked about and she agreed to trying two medications that in combination lower mortality in HFrEF, nitrate and hydralazine.  BiDil is not covered by patient's payor.  A/ Left Ventricular systolic dysfunction by echocardiogram No evidence of embolic source for stroke on TTE History of anaphylaxis to ACEI  Patient on Metoprolol   P/ Rx - isosorbide dinitrate 20 mg PO TID - hydralazine  10 mg PO TID            - Request PCP to titrate to hydralazine 40 mg TID as tolerated  - Patient to see PCP, Dr Criss Rosales on 08/19/18. - Carotid Dopplers on 08/26/18

## 2018-08-11 ENCOUNTER — Telehealth: Payer: Self-pay

## 2018-08-11 NOTE — Progress Notes (Addendum)
**  patient has mobility limitations which cannot be resolved with a cane, crutch or walker. Patient can safely self propel a wheelchair or has a caregiver who can assist. **     Additional information from attending (Dr. McDiarmid) examination with family: I spoke with [name] [name]' wife, [name] [name] by mobile phone at her place of work.  We talked about [name] [name]' reduced ejection fraction on echocardiogram. We talked about and she agreed to trying two medications that in combination lower mortality in HFrEF, nitrate and hydralazine.  BiDil is not covered by patient's payor.  A/ Left Ventricular systolic dysfunction by echocardiogram No evidence of embolic source for stroke on TTE History of anaphylaxis to ACEI  Patient on Metoprolol   P/ Rx - isosorbide dinitrate 20 mg PO TID - hydralazine  10 mg PO TID            - Request PCP to titrate to hydralazine 40 mg TID as tolerated  - Patient to see PCP, Dr Criss Rosales on 08/19/18. - Carotid Dopplers on 08/26/18      -Dr. Criss Rosales

## 2018-08-11 NOTE — Telephone Encounter (Signed)
Received message from Gaston at [facility].  "We received the referral for the wheelchair. Is it possible to have the narrative notes that are on the order in epic moved to an addendum in the chart notes from his visit in May? That is all we are missing, as I have pulled the order that was in epic."  Can we have McDiarmids note in 6/10 telephone encounter put into the chart notes from May.   Please advise.

## 2018-08-11 NOTE — Telephone Encounter (Signed)
Just got a message back from [facility]. The notes need to state - patient has mobility limitations which cannot be resolved with a cane, crutch or walker. Patient can safely self propel a wheelchair or has a caregiver who can assist.   Per [place].Marland KitchenMarland KitchenMarland Kitchen

## 2018-08-12 ENCOUNTER — Encounter: Payer: Self-pay | Admitting: Family Medicine

## 2018-08-12 NOTE — Telephone Encounter (Signed)
Community message sent to [employer] with [facility].  Jadavion Spoelstra,CMA

## 2018-08-12 NOTE — Telephone Encounter (Signed)
I added documentation to the office visit note from 07/24/18.  The additional documentation is in the patient's assessment and plan section under "Impaired mobility and ADLs."  This additional documentation should provide the information needed to justify Medicare coverage of the patient's manual wheelchair.   Please send the amended 07/24/18 note from the patient's office visit with me to [facility] at [facility].

## 2018-08-14 ENCOUNTER — Telehealth: Payer: Self-pay | Admitting: *Deleted

## 2018-08-14 NOTE — Telephone Encounter (Signed)
Mliss Sax calls back because she forgot to add on verbals for skilled nursing evaluation for wound care. Patient fell last week and has a draining wound near his eye.

## 2018-08-14 NOTE — Telephone Encounter (Signed)
Bernadette from [employer] calling for PT verbal orders as follows:  2 time(s) weekly for 2 week(s), also   OT eval for w/c mobility and ADLs  You can leave verbal orders on confidential voicemail.  Christen Bame, CMA

## 2018-08-15 ENCOUNTER — Emergency Department (HOSPITAL_COMMUNITY): Payer: Medicare Other

## 2018-08-15 ENCOUNTER — Encounter (HOSPITAL_COMMUNITY): Payer: Self-pay | Admitting: Emergency Medicine

## 2018-08-15 ENCOUNTER — Inpatient Hospital Stay (HOSPITAL_COMMUNITY)
Admission: EM | Admit: 2018-08-15 | Discharge: 2018-08-20 | DRG: 854 | Disposition: A | Discharge status: Home Health | Payer: Medicare Other | Attending: Family Medicine | Admitting: Family Medicine

## 2018-08-15 ENCOUNTER — Other Ambulatory Visit: Payer: Self-pay

## 2018-08-15 DIAGNOSIS — I110 Hypertensive heart disease with heart failure: Secondary | ICD-10-CM | POA: Diagnosis present

## 2018-08-15 DIAGNOSIS — I2510 Atherosclerotic heart disease of native coronary artery without angina pectoris: Secondary | ICD-10-CM | POA: Diagnosis present

## 2018-08-15 DIAGNOSIS — M1A9XX0 Chronic gout, unspecified, without tophus (tophi): Secondary | ICD-10-CM | POA: Diagnosis present

## 2018-08-15 DIAGNOSIS — I5022 Chronic systolic (congestive) heart failure: Secondary | ICD-10-CM | POA: Diagnosis present

## 2018-08-15 DIAGNOSIS — L02213 Cutaneous abscess of chest wall: Secondary | ICD-10-CM | POA: Diagnosis present

## 2018-08-15 DIAGNOSIS — F10239 Alcohol dependence with withdrawal, unspecified: Secondary | ICD-10-CM | POA: Diagnosis not present

## 2018-08-15 DIAGNOSIS — R410 Disorientation, unspecified: Secondary | ICD-10-CM | POA: Diagnosis not present

## 2018-08-15 DIAGNOSIS — S0292XA Unspecified fracture of facial bones, initial encounter for closed fracture: Secondary | ICD-10-CM | POA: Diagnosis not present

## 2018-08-15 DIAGNOSIS — S12300A Unspecified displaced fracture of fourth cervical vertebra, initial encounter for closed fracture: Secondary | ICD-10-CM | POA: Diagnosis present

## 2018-08-15 DIAGNOSIS — E871 Hypo-osmolality and hyponatremia: Secondary | ICD-10-CM | POA: Diagnosis present

## 2018-08-15 DIAGNOSIS — M1712 Unilateral primary osteoarthritis, left knee: Secondary | ICD-10-CM | POA: Diagnosis present

## 2018-08-15 DIAGNOSIS — Z79899 Other long term (current) drug therapy: Secondary | ICD-10-CM

## 2018-08-15 DIAGNOSIS — R4182 Altered mental status, unspecified: Secondary | ICD-10-CM | POA: Diagnosis not present

## 2018-08-15 DIAGNOSIS — S129XXA Fracture of neck, unspecified, initial encounter: Secondary | ICD-10-CM

## 2018-08-15 DIAGNOSIS — F319 Bipolar disorder, unspecified: Secondary | ICD-10-CM | POA: Diagnosis present

## 2018-08-15 DIAGNOSIS — F17210 Nicotine dependence, cigarettes, uncomplicated: Secondary | ICD-10-CM | POA: Diagnosis present

## 2018-08-15 DIAGNOSIS — Z1159 Encounter for screening for other viral diseases: Secondary | ICD-10-CM | POA: Diagnosis not present

## 2018-08-15 DIAGNOSIS — F0150 Vascular dementia without behavioral disturbance: Secondary | ICD-10-CM | POA: Diagnosis present

## 2018-08-15 DIAGNOSIS — R4781 Slurred speech: Secondary | ICD-10-CM | POA: Diagnosis present

## 2018-08-15 DIAGNOSIS — S12501A Unspecified nondisplaced fracture of sixth cervical vertebra, initial encounter for closed fracture: Secondary | ICD-10-CM | POA: Diagnosis present

## 2018-08-15 DIAGNOSIS — Z9181 History of falling: Secondary | ICD-10-CM | POA: Diagnosis not present

## 2018-08-15 DIAGNOSIS — E785 Hyperlipidemia, unspecified: Secondary | ICD-10-CM | POA: Diagnosis present

## 2018-08-15 DIAGNOSIS — Z8701 Personal history of pneumonia (recurrent): Secondary | ICD-10-CM

## 2018-08-15 DIAGNOSIS — L03313 Cellulitis of chest wall: Secondary | ICD-10-CM | POA: Diagnosis present

## 2018-08-15 DIAGNOSIS — Z880 Allergy status to penicillin: Secondary | ICD-10-CM

## 2018-08-15 DIAGNOSIS — L02411 Cutaneous abscess of right axilla: Secondary | ICD-10-CM | POA: Diagnosis present

## 2018-08-15 DIAGNOSIS — S022XXA Fracture of nasal bones, initial encounter for closed fracture: Secondary | ICD-10-CM | POA: Diagnosis present

## 2018-08-15 DIAGNOSIS — K219 Gastro-esophageal reflux disease without esophagitis: Secondary | ICD-10-CM | POA: Diagnosis present

## 2018-08-15 DIAGNOSIS — A419 Sepsis, unspecified organism: Secondary | ICD-10-CM | POA: Diagnosis not present

## 2018-08-15 DIAGNOSIS — S12400A Unspecified displaced fracture of fifth cervical vertebra, initial encounter for closed fracture: Secondary | ICD-10-CM | POA: Diagnosis present

## 2018-08-15 DIAGNOSIS — Z8673 Personal history of transient ischemic attack (TIA), and cerebral infarction without residual deficits: Secondary | ICD-10-CM

## 2018-08-15 DIAGNOSIS — K760 Fatty (change of) liver, not elsewhere classified: Secondary | ICD-10-CM | POA: Diagnosis present

## 2018-08-15 DIAGNOSIS — Z87892 Personal history of anaphylaxis: Secondary | ICD-10-CM

## 2018-08-15 DIAGNOSIS — W19XXXA Unspecified fall, initial encounter: Secondary | ICD-10-CM | POA: Diagnosis present

## 2018-08-15 DIAGNOSIS — Z8711 Personal history of peptic ulcer disease: Secondary | ICD-10-CM

## 2018-08-15 DIAGNOSIS — Z7902 Long term (current) use of antithrombotics/antiplatelets: Secondary | ICD-10-CM

## 2018-08-15 DIAGNOSIS — S0292XS Unspecified fracture of facial bones, sequela: Secondary | ICD-10-CM | POA: Diagnosis not present

## 2018-08-15 DIAGNOSIS — Z888 Allergy status to other drugs, medicaments and biological substances status: Secondary | ICD-10-CM

## 2018-08-15 LAB — COMPREHENSIVE METABOLIC PANEL
ALT: 27 U/L (ref 0–44)
AST: 29 U/L (ref 15–41)
Albumin: 2.9 g/dL — ABNORMAL LOW (ref 3.5–5.0)
Alkaline Phosphatase: 89 U/L (ref 38–126)
Anion gap: 13 (ref 5–15)
BUN: 12 mg/dL (ref 6–20)
CO2: 20 mmol/L — ABNORMAL LOW (ref 22–32)
Calcium: 9.5 mg/dL (ref 8.9–10.3)
Chloride: 99 mmol/L (ref 98–111)
Creatinine, Ser: 1.17 mg/dL (ref 0.61–1.24)
GFR calc Af Amer: >60 mL/min (ref >60)
GFR calc non Af Amer: >60 mL/min (ref >60)
Glucose, Bld: 106 mg/dL — ABNORMAL HIGH (ref 70–99)
Potassium: 4.3 mmol/L (ref 3.5–5.1)
Sodium: 132 mmol/L — ABNORMAL LOW (ref 135–145)
Total Bilirubin: 1 mg/dL (ref 0.3–1.2)
Total Protein: 7.3 g/dL (ref 6.5–8.1)

## 2018-08-15 LAB — TROPONIN I: Troponin I: <0.03 ng/mL (ref <0.03)

## 2018-08-15 LAB — URINALYSIS, ROUTINE W REFLEX MICROSCOPIC
Bacteria, UA: NONE SEEN
Bilirubin Urine: NEGATIVE
Glucose, UA: NEGATIVE mg/dL
Hgb urine dipstick: NEGATIVE
Ketones, ur: 5 mg/dL — AB
Leukocytes,Ua: NEGATIVE
Nitrite: NEGATIVE
Protein, ur: 30 mg/dL — AB
Specific Gravity, Urine: 1.011 (ref 1.005–1.030)
pH: 5 (ref 5.0–8.0)

## 2018-08-15 LAB — LACTIC ACID, PLASMA
Lactic Acid, Venous: 1.3 mmol/L (ref 0.5–1.9)
Lactic Acid, Venous: 0.7 mmol/L (ref 0.5–1.9)

## 2018-08-15 LAB — CBC WITH DIFFERENTIAL/PLATELET
Abs Immature Granulocytes: 0.16 10*3/uL — ABNORMAL HIGH (ref 0.00–0.07)
Basophils Absolute: 0 10*3/uL (ref 0.0–0.1)
Basophils Relative: 0 %
Eosinophils Absolute: 0 10*3/uL (ref 0.0–0.5)
Eosinophils Relative: 0 %
HCT: 40.9 % (ref 39.0–52.0)
Hemoglobin: 13.8 g/dL (ref 13.0–17.0)
Immature Granulocytes: 1 %
Lymphocytes Relative: 8 %
Lymphs Abs: 1.1 10*3/uL (ref 0.7–4.0)
MCH: 33.4 pg (ref 26.0–34.0)
MCHC: 33.7 g/dL (ref 30.0–36.0)
MCV: 99 fL (ref 80.0–100.0)
Monocytes Absolute: 1.2 10*3/uL — ABNORMAL HIGH (ref 0.1–1.0)
Monocytes Relative: 9 %
Neutro Abs: 10.8 10*3/uL — ABNORMAL HIGH (ref 1.7–7.7)
Neutrophils Relative %: 82 %
Platelets: 247 10*3/uL (ref 150–400)
RBC: 4.13 MIL/uL — ABNORMAL LOW (ref 4.22–5.81)
RDW: 12.9 % (ref 11.5–15.5)
WBC: 13.2 10*3/uL — ABNORMAL HIGH (ref 4.0–10.5)
nRBC: 0 % (ref 0.0–0.2)

## 2018-08-15 LAB — I-STAT CHEM 8, ED
BUN: 16 mg/dL (ref 6–20)
Calcium, Ion: 1.09 mmol/L — ABNORMAL LOW (ref 1.15–1.40)
Chloride: 100 mmol/L (ref 98–111)
Creatinine, Ser: 1.1 mg/dL (ref 0.61–1.24)
Glucose, Bld: 108 mg/dL — ABNORMAL HIGH (ref 70–99)
HCT: 44 % (ref 39.0–52.0)
Hemoglobin: 15 g/dL (ref 13.0–17.0)
Potassium: 5.2 mmol/L — ABNORMAL HIGH (ref 3.5–5.1)
Sodium: 131 mmol/L — ABNORMAL LOW (ref 135–145)
TCO2: 25 mmol/L (ref 22–32)

## 2018-08-15 LAB — CBG MONITORING, ED: Glucose-Capillary: 98 mg/dL (ref 70–99)

## 2018-08-15 LAB — SARS CORONAVIRUS 2 BY RT PCR (HOSPITAL ORDER, PERFORMED IN ~~LOC~~ HOSPITAL LAB): SARS Coronavirus 2: NEGATIVE

## 2018-08-15 LAB — PROTIME-INR
INR: 1 (ref 0.8–1.2)
Prothrombin Time: 12.6 seconds (ref 11.4–15.2)

## 2018-08-15 LAB — APTT: aPTT: 32 seconds (ref 24–36)

## 2018-08-15 MED ORDER — FLUOXETINE HCL 20 MG PO CAPS
40.0000 mg | ORAL_CAPSULE | Freq: Every day | ORAL | Status: DC
  Administered 2018-08-15 – 2018-08-19 (×5): 40 mg via ORAL
  Filled 2018-08-15 (×5): qty 2

## 2018-08-15 MED ORDER — VANCOMYCIN HCL IN DEXTROSE 750-5 MG/150ML-% IV SOLN
750.0000 mg | Freq: Two times a day (BID) | INTRAVENOUS | Status: DC
  Administered 2018-08-15 – 2018-08-20 (×9): 750 mg via INTRAVENOUS
  Filled 2018-08-15 (×9): qty 150

## 2018-08-15 MED ORDER — VANCOMYCIN HCL IN DEXTROSE 1-5 GM/200ML-% IV SOLN
1000.0000 mg | Freq: Once | INTRAVENOUS | Status: AC
  Administered 2018-08-15: 1000 mg via INTRAVENOUS
  Filled 2018-08-15: qty 200

## 2018-08-15 MED ORDER — SODIUM CHLORIDE 0.9 % IV SOLN
2.0000 g | Freq: Three times a day (TID) | INTRAVENOUS | Status: DC
  Administered 2018-08-15 – 2018-08-19 (×11): 2 g via INTRAVENOUS
  Filled 2018-08-15 (×11): qty 2

## 2018-08-15 MED ORDER — SALINE SPRAY 0.65 % NA SOLN
1.0000 | Freq: Four times a day (QID) | NASAL | Status: DC
  Administered 2018-08-15 – 2018-08-20 (×17): 1 via NASAL
  Filled 2018-08-15 (×2): qty 44

## 2018-08-15 MED ORDER — ERYTHROMYCIN 5 MG/GM OP OINT
1.0000 "application " | TOPICAL_OINTMENT | Freq: Three times a day (TID) | OPHTHALMIC | Status: DC
  Administered 2018-08-15 – 2018-08-20 (×13): 1 via OPHTHALMIC
  Filled 2018-08-15 (×2): qty 3.5

## 2018-08-15 MED ORDER — HEPARIN (PORCINE) 25000 UT/250ML-% IV SOLN
1350.0000 [IU]/h | INTRAVENOUS | Status: DC
  Administered 2018-08-15: 1050 [IU]/h via INTRAVENOUS
  Filled 2018-08-15: qty 250

## 2018-08-15 MED ORDER — IOHEXOL 300 MG/ML  SOLN
75.0000 mL | Freq: Once | INTRAMUSCULAR | Status: AC | PRN
  Administered 2018-08-15: 75 mL via INTRAVENOUS

## 2018-08-15 MED ORDER — VANCOMYCIN HCL IN DEXTROSE 750-5 MG/150ML-% IV SOLN
750.0000 mg | Freq: Two times a day (BID) | INTRAVENOUS | Status: DC
  Filled 2018-08-15: qty 150

## 2018-08-15 MED ORDER — HEPARIN SODIUM (PORCINE) 5000 UNIT/ML IJ SOLN: 5000.0000 [IU] | Freq: Three times a day (TID) | INTRAMUSCULAR | Status: DC

## 2018-08-15 MED ORDER — SODIUM CHLORIDE 0.9 % IV SOLN
2.0000 g | Freq: Once | INTRAVENOUS | Status: AC
  Administered 2018-08-15: 2 g via INTRAVENOUS
  Filled 2018-08-15: qty 2

## 2018-08-15 NOTE — TOC Initial Note (Signed)
Transition of Care [facility]) - Initial/Assessment Note    Patient Details  Name: [name] [name] MRN: [id number] Date of Birth: May 30, 1960  Transition of Care [facility] CM/SW Contact:    Erenest Rasher, RN Phone Number: [phone] 08/15/2018, 4:06 PM  Clinical Narrative:                 Received call from Jena # [phone] for [facility] and PT.  Expected Discharge Plan: [place] Barriers to Discharge: Continued Medical Work up   Patient Goals and CMS Choice        Expected Discharge Plan and Services Expected Discharge Plan: [place]   Discharge Planning Services: CM Consult                                          Prior Living Arrangements/Services     Patient language and need for interpreter reviewed:: Yes                 Activities of Daily Living      Permission Sought/Granted Permission sought to share information with : Case Manager, PCP Permission granted to share information with : Yes, Verbal Permission Granted              Emotional Assessment           Psych Involvement: No (comment)  Admission diagnosis:  AMS; Fall (on plavix) Patient Active Problem List   Diagnosis Date Noted  . Left ventricular systolic dysfunction 41/96/2229  . Weight loss, non-intentional 07/25/2018  . Pure hypercholesterolemia 07/25/2018  . Visual impairment 07/25/2018  . Renal mass, right 07/25/2018  . Impaired mobility and ADLs 07/25/2018  . Depression   . Frequent falls 07/17/2018  . Dementia ([place]), vascular 07/17/2018  . Cerebral infarct, left corona radiata ([place]) 07/17/2018  . Chronic pain of left knee 07/30/2017  . Chronic pain of right knee 02/13/2016  . History of stroke   . Total incontinence   . Essential hypertension   . H/O: duodenal ulcer 12/06/2014  . Abnormality of gait 07/28/2009  . Alcoholism ([place]) 07/11/2009  . Bipolar 1 disorder, mixed, moderate ([place]) 06/27/2006  .  GOUT, CHRONIC 04/25/2006  . OBESITY, NOS 04/25/2006  . TOBACCO DEPENDENCE 04/25/2006  . History of traumatic brain injury 02/27/1995  . Encephalomalacia, bilateral frontal, traumatic 02/27/1995   PCP:  Sherene Sires, DO Pharmacy:   [id number] - [place], [place]. [address] [address] Phone: [phone] Fax: [phone]     Social Determinants of Health (SDOH) Interventions    Readmission Risk Interventions No flowsheet data found.

## 2018-08-15 NOTE — Progress Notes (Signed)
Pharmacy Antibiotic Note  [name] [name] is a 58 y.o. male admitted on 08/15/2018 with wound infection.  Pharmacy has been consulted for Vancomycin / Cefepime dosing.  Plan: Vancomycin 750 mg iv Q 12 (1 gram dose at 1300 pm) Calculated AUC of 504 Cefepime 2 grams iv Q 8 hours  Height: 5\' 8"  (172.7 cm) Weight: 209 lb 14.1 oz (95.2 kg) IBW/kg (Calculated) : 68.4  Temp (24hrs), Avg:97.9 F (36.6 C), Min:97.6 F (36.4 C), Max:98.4 F (36.9 C)  Recent Labs  Lab 08/15/18 1224 08/15/18 1233 08/15/18 1828  WBC 13.2*  --   --   CREATININE 1.17 1.10  --   LATICACIDVEN 1.3  --  0.7    Estimated Creatinine Clearance: 80.9 mL/min (by C-G formula based on SCr of 1.1 mg/dL).    Allergies  Allergen Reactions  . Ace Inhibitors Anaphylaxis and Swelling    Tongue and face became swollen  . Penicillins Rash    From childhood: Has patient had a PCN reaction causing immediate rash, facial/tongue/throat swelling, SOB or lightheadedness with hypotension: Yes Has patient had a PCN reaction causing severe rash involving mucus membranes or skin necrosis: Unk Has patient had a PCN reaction that required hospitalization: Unk Has patient had a PCN reaction occurring within the last 10 years: No If all of the above answers are "NO", then may proceed with Cephalosporin use.     Thank you for allowing pharmacy to be a part of this patient's care.  Tad Moore 08/15/2018 9:39 PM

## 2018-08-15 NOTE — Consult Note (Signed)
Reason for Consult: Right chest wall abscess Referring Physician: Dr. Gilford SilviusYao  [name] [name] is an 58 y.o. male.  HPI: Patient is a 58 year old male, pleasantly confused, with a history of bipolar disorder, depression, CVA-on Plavix, recent right enucleation, who comes in after a fall.  Patient is a poor historian, patient's wife is at the bedside and helps with most of the history.  She states that the patient recently fell today likely secondary to his lower extremity weakness as well as enucleation.  Patient underwent CT scan and was found to have fractured cervical vertebrae.  Patient was evaluated by Dr. Dutch QuintPoole.  It was recommended patient continue with cervical collar.  According to her the patient has had 2 to 3 days of right lateral chest wall redness.  Patient does state that it is tender in that area.  There is been no drainage to the area.  Patient denies any other areas of infection.  CT scan which I did review personally did reveal a small collection in the subcutaneous space on the right side consistent with either abscess or hematoma.  Of note patient's wife states his last Plavix was on Thursday evening.  Past Medical History:  Diagnosis Date  . Alcohol abuse   . ANEMIA, FOLIC ACID DEFICIENCY 08/02/2009   Qualifier: Diagnosis of  By: Sharen HonesGutierrez  MD, Wynona CanesJavier    . Arthritis    left knee  . Bipolar disorder (HCC)   . Cerebral infarct, left corona radiata (HCC) 07/17/2018  . Dementia (HCC), vascular 07/17/2018  . Depression   . DEPRESSION, MAJOR, RECURRENT 04/25/2006   Qualifier: Diagnosis of  By: Seleta Rhymesowand MD, Loraine LericheMark    . Duodenal ulcer 12/06/2014   Qualifier: Diagnosis of  By: Seleta Rhymesowand MD, Loraine LericheMark    . 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 Erectile dysfunction 02/21/2018  . Fall   . Gastrointestinal hemorrhage associated with duodenal ulcer   . GERD (gastroesophageal reflux disease)   . Hepatic encephalopathy (HCC), possible  history of 09/03/2010  . Hepatic steatosis 06/09/2010   Diagnosed by U/S 05/12/10. Likely secondary to chronic ETOH abuse. Will continue to follow.    . History of traumatic brain injury   . Hyperkalemia 12/08/2014  . Hypertension   . Left leg weakness 07/27/2015  . Orbital floor (blow-out) closed fracture (HCC) 05/25/2017   Left orbit, stable.  No surgical intervention required.  . Pneumonia   . Pure hypercholesterolemia 07/25/2018  . Stroke [facility](HCC)    a few spanning from 1997 to last year  . Tobacco dependence     Past Surgical History:  Procedure Laterality Date  . COLONOSCOPY    . ESOPHAGOGASTRODUODENOSCOPY (EGD) WITH PROPOFOL Left 12/09/2014   Procedure: ESOPHAGOGASTRODUODENOSCOPY (EGD) WITH PROPOFOL;  Surgeon: Bernette Redbirdobert Buccini, MD;  [facility];  Service: Endoscopy;  Laterality: Left;  . EVISCERATION Right 11/06/2017   Procedure: EVISCERATION REPAIR RIGHT EYE;  Surgeon: Carmela RimaPatel, Narendra, MD;  [facility];  Service: Ophthalmology;  Laterality: Right;  . EYE SURGERY     eye lid was split    No family history on file.  Social History:  reports that he has been smoking cigarettes. He has a 15.00 pack-year smoking history. He has never used smokeless tobacco. He reports current alcohol use of about 14.0 standard drinks of alcohol per week. He reports that he does not use drugs.  Allergies:  Allergies  Allergen Reactions  . Ace Inhibitors Anaphylaxis and Swelling    Tongue and face became  swollen  . Penicillins Rash    From childhood: Has patient had a PCN reaction causing immediate rash, facial/tongue/throat swelling, SOB or lightheadedness with hypotension: Yes Has patient had a PCN reaction causing severe rash involving mucus membranes or skin necrosis: Unk Has patient had a PCN reaction that required hospitalization: Unk Has patient had a PCN reaction occurring within the last 10 years: No If all of the above answers are "NO", then may proceed with Cephalosporin  use.     Medications: I have reviewed the patient's current medications.  Results for orders placed or performed during the hospital encounter of 08/15/18 (from the past 48 hour(s))  CBG monitoring, ED     Status: None   Collection Time: 08/15/18 12:01 PM  Result Value Ref Range   Glucose-Capillary 98 70 - 99 mg/dL   Comment 1 Notify RN    Comment 2 Document in Chart   Urinalysis, Routine w reflex microscopic     Status: Abnormal   Collection Time: 08/15/18 12:08 PM  Result Value Ref Range   Color, Urine YELLOW YELLOW   APPearance CLEAR CLEAR   Specific Gravity, Urine 1.011 1.005 - 1.030   pH 5.0 5.0 - 8.0   Glucose, UA NEGATIVE NEGATIVE mg/dL   Hgb urine dipstick NEGATIVE NEGATIVE   Bilirubin Urine NEGATIVE NEGATIVE   Ketones, ur 5 (A) NEGATIVE mg/dL   Protein, ur 30 (A) NEGATIVE mg/dL   Nitrite NEGATIVE NEGATIVE   Leukocytes,Ua NEGATIVE NEGATIVE   RBC / HPF 0-5 0 - 5 RBC/hpf   WBC, UA 0-5 0 - 5 WBC/hpf   Bacteria, UA NONE SEEN NONE SEEN   Squamous Epithelial / LPF 0-5 0 - 5    Comment: Performed at [facility], [address], [place], [place] [address]  CBC with Differential/Platelet     Status: Abnormal   Collection Time: 08/15/18 12:24 PM  Result Value Ref Range   WBC 13.2 (H) 4.0 - 10.5 K/uL   RBC 4.13 (L) 4.22 - 5.81 MIL/uL   Hemoglobin 13.8 13.0 - 17.0 g/dL   HCT 60.4 54.0 - 98.1 %   MCV 99.0 80.0 - 100.0 fL   MCH 33.4 26.0 - 34.0 pg   MCHC 33.7 30.0 - 36.0 g/dL   RDW 19.1 47.8 - 29.5 %   Platelets 247 150 - 400 K/uL   nRBC 0.0 0.0 - 0.2 %   Neutrophils Relative % 82 %   Neutro Abs 10.8 (H) 1.7 - 7.7 K/uL   Lymphocytes Relative 8 %   Lymphs Abs 1.1 0.7 - 4.0 K/uL   Monocytes Relative 9 %   Monocytes Absolute 1.2 (H) 0.1 - 1.0 K/uL   Eosinophils Relative 0 %   Eosinophils Absolute 0.0 0.0 - 0.5 K/uL   Basophils Relative 0 %   Basophils Absolute 0.0 0.0 - 0.1 K/uL   Immature Granulocytes 1 %   Abs Immature Granulocytes 0.16 (H) 0.00 - 0.07 K/uL     Comment: Performed at [facility], [address], [place], [place] [address]  Comprehensive metabolic panel     Status: Abnormal   Collection Time: 08/15/18 12:24 PM  Result Value Ref Range   Sodium 132 (L) 135 - 145 mmol/L   Potassium 4.3 3.5 - 5.1 mmol/L   Chloride 99 98 - 111 mmol/L   CO2 20 (L) 22 - 32 mmol/L   Glucose, Bld 106 (H) 70 - 99 mg/dL   BUN 12 6 - 20 mg/dL  Creatinine, Ser 1.17 0.61 - 1.24 mg/dL   Calcium 9.5 8.9 - 13.010.3 mg/dL   Total Protein 7.3 6.5 - 8.1 g/dL   Albumin 2.9 (L) 3.5 - 5.0 g/dL   AST 29 15 - 41 U/L   ALT 27 0 - 44 U/L   Alkaline Phosphatase 89 38 - 126 U/L   Total Bilirubin 1.0 0.3 - 1.2 mg/dL   GFR calc non Af Amer >60 >60 mL/min   GFR calc Af Amer >60 >60 mL/min   Anion gap 13 5 - 15    Comment: Performed at [facility], [address] [place], [place] [address]  Troponin I - ONCE - STAT     Status: None   Collection Time: 08/15/18 12:24 PM  Result Value Ref Range   Troponin I <0.03 <0.03 ng/mL    Comment: Performed at [facility], [address] [place], [place] [address]  Lactic acid, plasma     Status: None   Collection Time: 08/15/18 12:24 PM  Result Value Ref Range   Lactic Acid, Venous 1.3 0.5 - 1.9 mmol/L    Comment: Performed at [facility], [address] [place], [place] [address]  Protime-INR     Status: None   Collection Time: 08/15/18 12:24 PM  Result Value Ref Range   Prothrombin Time 12.6 11.4 - 15.2 seconds   INR 1.0 0.8 - 1.2    Comment: (NOTE) INR goal varies based on device and disease states. Performed at [facility], [address] [place], [place] [address]   I-stat chem 8, ED (not at [facility] or [facility])     Status: Abnormal   Collection Time: 08/15/18 12:33 PM  Result Value Ref Range   Sodium 131 (L) 135 - 145 mmol/L   Potassium 5.2 (H) 3.5 - 5.1 mmol/L   Chloride 100 98 - 111 mmol/L   BUN 16 6 - 20 mg/dL   Creatinine, Ser 0.101.10 0.61 - 1.24 mg/dL   Glucose, Bld 272108 (H) 70 - 99  mg/dL   Calcium, Ion 5.361.09 (L) 1.15 - 1.40 mmol/L   TCO2 25 22 - 32 mmol/L   Hemoglobin 15.0 13.0 - 17.0 g/dL   HCT 64.444.0 03.439.0 - 74.252.0 %    Ct Head Wo Contrast  Result Date: 08/15/2018 CLINICAL DATA:  Fall, found down EXAM: CT HEAD WITHOUT CONTRAST CT MAXILLOFACIAL WITHOUT CONTRAST CT CERVICAL SPINE WITHOUT CONTRAST TECHNIQUE: Multidetector CT imaging of the head, cervical spine, and maxillofacial structures were performed using the standard protocol without intravenous contrast. Multiplanar CT image reconstructions of the cervical spine and maxillofacial structures were also generated. COMPARISON:  CT brain, 05/25/2017 FINDINGS: CT HEAD FINDINGS Brain: No evidence of acute infarction, hemorrhage, hydrocephalus, extra-axial collection or mass lesion/mass effect. Extensive periventricular and deep white matter hypodensity and global volume. Vascular: No hyperdense vessel or unexpected calcification. CT FACIAL BONES FINDINGS Skull: Normal. Negative for fracture or focal lesion. Facial bones: Multiple nondisplaced fractures of the nasal bones (series 5, image 37). No displaced fractures or dislocations. Sinuses/Orbits: No acute finding. Right ocular prosthesis. Chronic fracture deformity of the left orbital floor. Other: None. CT CERVICAL SPINE FINDINGS Alignment: Normal. Skull base and vertebrae: Minimally displaced fractures of the right transverse process and vertebral foramen of the C4 and C5 vertebral bodies (series 15, image 55, 64). There is a tiny, nondisplaced corner type fracture of the anterior inferior endplate of C6 (series 16, image 32). There is a nonacute, although new  compared to prior examination, fracture of the C6 spinous process (series 16, image 33). No primary bone lesion or focal pathologic process. Soft tissues and spinal canal: No prevertebral fluid or swelling. No visible canal hematoma. Disc levels:  Intact. Upper chest: Negative. Other: None. IMPRESSION: 1. No acute intracranial  pathology. Extensive periventricular and deep white matter hypodensity and global volume, which likely reflects unusually small-vessel white matter disease. 2. Multiple nondisplaced fractures of the nasal bones (series 5, image 37). No other acute displaced fracture of the facial bones. 3. Right ocular prosthesis. Chronic fracture deformity of the left orbital floor. 4. Minimally displaced fractures of the right transverse process and vertebral foramen of the C4 and C5 vertebral bodies (series 15, image 55, 64). There is a tiny, nondisplaced corner type fracture of the anterior inferior endplate of C6 (series 16, image 32), of uncertain acuity. There is a nonacute, although new compared to prior examination, fracture of the C6 spinous process (series 16, image 33). Although new compared to prior examination dated 05/25/2017, these are of uncertain acuity given appearance and history of multiple falls. Electronically Signed   By: Eddie Candle M.D.   On: 08/15/2018 15:38   Ct Chest W Contrast  Result Date: 08/15/2018 CLINICAL DATA:  Status post fall. Area of redness under the left arm noted clinically. EXAM: CT CHEST WITH CONTRAST TECHNIQUE: Multidetector CT imaging of the chest was performed during intravenous contrast administration. CONTRAST:  73mL OMNIPAQUE IOHEXOL 300 MG/ML  SOLN COMPARISON:  Chest radiograph 08/15/2018 FINDINGS: Cardiovascular: No significant vascular findings. Normal heart size. No pericardial effusion. Mediastinum/Nodes: No enlarged mediastinal, hilar, or axillary lymph nodes. Thyroid gland, trachea, and esophagus demonstrate no significant findings. Lungs/Pleura: Bibasilar atelectasis. The lungs otherwise clear. Upper Abdomen: No acute abnormality. Musculoskeletal: Mild chronic appearing, with sclerotic borders, compression deformity of the superior endplate of T4 vertebral body. Complex fluid collection within the right lateral chest wall spends approximately 11 cm anterior to posterior  dimension. There is mild overlying skin thickening. IMPRESSION: 1. Bibasilar atelectasis. 2. Mild chronic appearing compression deformity of the superior endplate of T4 vertebral body may be posttraumatic or degenerative. 3. Large complex fluid collection within the right lateral chest wall, with overlying skin thickening. This may represent subcutaneous hematoma or an abscess formation. Electronically Signed   By: Fidela Salisbury M.D.   On: 08/15/2018 15:40   Ct Cervical Spine Wo Contrast  Result Date: 08/15/2018 CLINICAL DATA:  Fall, found down EXAM: CT HEAD WITHOUT CONTRAST CT MAXILLOFACIAL WITHOUT CONTRAST CT CERVICAL SPINE WITHOUT CONTRAST TECHNIQUE: Multidetector CT imaging of the head, cervical spine, and maxillofacial structures were performed using the standard protocol without intravenous contrast. Multiplanar CT image reconstructions of the cervical spine and maxillofacial structures were also generated. COMPARISON:  CT brain, 05/25/2017 FINDINGS: CT HEAD FINDINGS Brain: No evidence of acute infarction, hemorrhage, hydrocephalus, extra-axial collection or mass lesion/mass effect. Extensive periventricular and deep white matter hypodensity and global volume. Vascular: No hyperdense vessel or unexpected calcification. CT FACIAL BONES FINDINGS Skull: Normal. Negative for fracture or focal lesion. Facial bones: Multiple nondisplaced fractures of the nasal bones (series 5, image 37). No displaced fractures or dislocations. Sinuses/Orbits: No acute finding. Right ocular prosthesis. Chronic fracture deformity of the left orbital floor. Other: None. CT CERVICAL SPINE FINDINGS Alignment: Normal. Skull base and vertebrae: Minimally displaced fractures of the right transverse process and vertebral foramen of the C4 and C5 vertebral bodies (series 15, image 55, 64). There is a tiny, nondisplaced corner type fracture of the  anterior inferior endplate of C6 (series 16, image 32). There is a nonacute, although  new compared to prior examination, fracture of the C6 spinous process (series 16, image 33). No primary bone lesion or focal pathologic process. Soft tissues and spinal canal: No prevertebral fluid or swelling. No visible canal hematoma. Disc levels:  Intact. Upper chest: Negative. Other: None. IMPRESSION: 1. No acute intracranial pathology. Extensive periventricular and deep white matter hypodensity and global volume, which likely reflects unusually small-vessel white matter disease. 2. Multiple nondisplaced fractures of the nasal bones (series 5, image 37). No other acute displaced fracture of the facial bones. 3. Right ocular prosthesis. Chronic fracture deformity of the left orbital floor. 4. Minimally displaced fractures of the right transverse process and vertebral foramen of the C4 and C5 vertebral bodies (series 15, image 55, 64). There is a tiny, nondisplaced corner type fracture of the anterior inferior endplate of C6 (series 16, image 32), of uncertain acuity. There is a nonacute, although new compared to prior examination, fracture of the C6 spinous process (series 16, image 33). Although new compared to prior examination dated 05/25/2017, these are of uncertain acuity given appearance and history of multiple falls. Electronically Signed   By: Lauralyn PrimesAlex  Bibbey M.D.   On: 08/15/2018 15:38   Dg Chest Port 1 View  Result Date: 08/15/2018 CLINICAL DATA:  Found down. EXAM: PORTABLE CHEST 1 VIEW COMPARISON:  07/26/2015 FINDINGS: The cardiac silhouette, mediastinal and hilar contours are within normal limits given the AP projection and portable technique. Low lung volumes with vascular crowding and streaky basilar atelectasis. No infiltrates, edema or effusions. The bony thorax is grossly intact. Remote healed left rib fractures are noted. IMPRESSION: Low lung volumes with vascular crowding and bibasilar atelectasis. Electronically Signed   By: Rudie MeyerP.  Gallerani M.D.   On: 08/15/2018 14:07   Ct Maxillofacial Wo  Contrast  Result Date: 08/15/2018 CLINICAL DATA:  Fall, found down EXAM: CT HEAD WITHOUT CONTRAST CT MAXILLOFACIAL WITHOUT CONTRAST CT CERVICAL SPINE WITHOUT CONTRAST TECHNIQUE: Multidetector CT imaging of the head, cervical spine, and maxillofacial structures were performed using the standard protocol without intravenous contrast. Multiplanar CT image reconstructions of the cervical spine and maxillofacial structures were also generated. COMPARISON:  CT brain, 05/25/2017 FINDINGS: CT HEAD FINDINGS Brain: No evidence of acute infarction, hemorrhage, hydrocephalus, extra-axial collection or mass lesion/mass effect. Extensive periventricular and deep white matter hypodensity and global volume. Vascular: No hyperdense vessel or unexpected calcification. CT FACIAL BONES FINDINGS Skull: Normal. Negative for fracture or focal lesion. Facial bones: Multiple nondisplaced fractures of the nasal bones (series 5, image 37). No displaced fractures or dislocations. Sinuses/Orbits: No acute finding. Right ocular prosthesis. Chronic fracture deformity of the left orbital floor. Other: None. CT CERVICAL SPINE FINDINGS Alignment: Normal. Skull base and vertebrae: Minimally displaced fractures of the right transverse process and vertebral foramen of the C4 and C5 vertebral bodies (series 15, image 55, 64). There is a tiny, nondisplaced corner type fracture of the anterior inferior endplate of C6 (series 16, image 32). There is a nonacute, although new compared to prior examination, fracture of the C6 spinous process (series 16, image 33). No primary bone lesion or focal pathologic process. Soft tissues and spinal canal: No prevertebral fluid or swelling. No visible canal hematoma. Disc levels:  Intact. Upper chest: Negative. Other: None. IMPRESSION: 1. No acute intracranial pathology. Extensive periventricular and deep white matter hypodensity and global volume, which likely reflects unusually small-vessel white matter disease. 2.  Multiple nondisplaced fractures of the  nasal bones (series 5, image 37). No other acute displaced fracture of the facial bones. 3. Right ocular prosthesis. Chronic fracture deformity of the left orbital floor. 4. Minimally displaced fractures of the right transverse process and vertebral foramen of the C4 and C5 vertebral bodies (series 15, image 55, 64). There is a tiny, nondisplaced corner type fracture of the anterior inferior endplate of C6 (series 16, image 32), of uncertain acuity. There is a nonacute, although new compared to prior examination, fracture of the C6 spinous process (series 16, image 33). Although new compared to prior examination dated 05/25/2017, these are of uncertain acuity given appearance and history of multiple falls. Electronically Signed   By: Lauralyn Primes M.D.   On: 08/15/2018 15:38    Review of Systems  Constitutional: Negative for chills, fever and malaise/fatigue.  HENT: Negative for ear discharge, hearing loss and sore throat.   Eyes: Negative for blurred vision and discharge.  Respiratory: Negative for cough and shortness of breath.   Cardiovascular: Negative for chest pain, orthopnea and leg swelling.  Gastrointestinal: Negative for abdominal pain, constipation, diarrhea, heartburn, nausea and vomiting.  Musculoskeletal: Negative for myalgias and neck pain.  Skin: Negative for itching and rash.  Neurological: Negative for dizziness, focal weakness, seizures and loss of consciousness.  Endo/Heme/Allergies: Negative for environmental allergies. Does not bruise/bleed easily.  Psychiatric/Behavioral: Negative for depression and suicidal ideas.  All other systems reviewed and are negative.  Blood pressure (!) 147/99, pulse 76, temperature 98.4 F (36.9 C), temperature source Oral, resp. rate 15, SpO2 99 %. Physical Exam  Constitutional: He is oriented to person, place, and time. Vital signs are normal. He appears well-developed and well-nourished.  Conversant No  acute distress  Eyes: Lids are normal. No scleral icterus.  No lid lag Moist conjunctiva  Neck: No tracheal tenderness present. No thyromegaly present.  No cervical lymphadenopathy  Cardiovascular: Normal rate, regular rhythm and intact distal pulses.  No murmur heard. Respiratory: Effort normal and breath sounds normal. He has no wheezes. He has no rales.  Right inferior axillary/chest wall erythema, approximately 10 x 8 cm, tender, no drainage to the area, fluctuant  GI: There is no hepatosplenomegaly. There is no abdominal tenderness. No hernia.  Neurological: He is alert and oriented to person, place, and time.  Normal gait and station  Skin: Skin is warm. No rash noted. No cyanosis. Nails show no clubbing.  Normal skin turgor  Psychiatric: Judgment normal.  Appropriate affect    Assessment/Plan: 58 year old male with right chest wall abscess, status post fall CVA Hyperlipidemia Gout  Plan: 1.  Recommend continued antibiotics.  He is currently on Vanco and Zosyn.  Patient would likely benefit from being converted off his Plavix onto heparin drip as needed.  Secondary to his Plavix would likely wait approximately 5 days prior to incision and drainage of the area unless the area spontaneously drained. 2.  We will continue to follow along with you.  Axel Filler 08/15/2018, 4:45 PM

## 2018-08-15 NOTE — ED Notes (Signed)
Nurse navigator spoke with primary nurse asked to call wife to stay with patient since he has altered mental status. Patient will arrive shortly approximately 30 minutes.

## 2018-08-15 NOTE — Progress Notes (Addendum)
ANTICOAGULATION CONSULT NOTE - Initial Consult  Pharmacy Consult for: Heparin Indication: stroke (transition from Plavix for possible procedure)  Allergies  Allergen Reactions  . Ace Inhibitors Anaphylaxis and Swelling    Tongue and face became swollen  . Penicillins Rash    From childhood: Has patient had a PCN reaction causing immediate rash, facial/tongue/throat swelling, SOB or lightheadedness with hypotension: Yes Has patient had a PCN reaction causing severe rash involving mucus membranes or skin necrosis: Unk Has patient had a PCN reaction that required hospitalization: Unk Has patient had a PCN reaction occurring within the last 10 years: No If all of the above answers are "NO", then may proceed with Cephalosporin use.     Patient Measurements: Height: 5\' 8"  (172.7 cm) Weight: 209 lb 14.1 oz (95.2 kg) IBW/kg (Calculated) : 68.4 Heparin Dosing Weight: 88.4 kg  Vital Signs: Temp: 97.8 F (36.6 C) (06/19 1816) Temp Source: Oral (06/19 1816) BP: 142/94 (06/19 1816) Pulse Rate: 80 (06/19 1816)  Labs: Recent Labs    08/15/18 1224 08/15/18 1233  HGB 13.8 15.0  HCT 40.9 44.0  PLT 247  --   LABPROT 12.6  --   INR 1.0  --   CREATININE 1.17 1.10  TROPONINI <0.03  --     Estimated Creatinine Clearance: 80.9 mL/min (by C-G formula based on SCr of 1.1 mg/dL).   Medical History: Past Medical History:  Diagnosis Date  . Alcohol abuse   . ANEMIA, FOLIC ACID DEFICIENCY 11/05/3714   Qualifier: Diagnosis of  By: Danise Mina  MD, Garlon Hatchet    . Arthritis    left knee  . Bipolar disorder ([place])   . Cerebral infarct, left corona radiata ([place]) 07/17/2018  . Dementia ([place]), vascular 07/17/2018  . Depression   . DEPRESSION, MAJOR, RECURRENT 04/25/2006   Qualifier: Diagnosis of  By: Darylene Price MD, Elta Guadeloupe    . Duodenal ulcer 12/06/2014   Qualifier: Diagnosis of  By: Darylene Price MD, Elta Guadeloupe    . 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  Erectile dysfunction 02/21/2018  . Fall   . Gastrointestinal hemorrhage associated with duodenal ulcer   . GERD (gastroesophageal reflux disease)   . Hepatic encephalopathy ([place]), possible history of 09/03/2010  . Hepatic steatosis 06/09/2010   Diagnosed by U/S 05/12/10. Likely secondary to chronic ETOH abuse. Will continue to follow.    . History of traumatic brain injury   . Hyperkalemia 12/08/2014  . Hypertension   . Left leg weakness 07/27/2015  . Orbital floor (blow-out) closed fracture ([place]) 05/25/2017   Left orbit, stable.  No surgical intervention required.  . Pneumonia   . Pure hypercholesterolemia 07/25/2018  . Stroke [facility])    a few spanning from 1997 to last year  . Tobacco dependence     Assessment: 58 yr old male with recent CVA, on Plavix at home, admitted after fall with right chest wall abscess (last dose of Plavix approx. 24 hrs ago, on 6/18). Plavix currently on hold for possible I and D of abscess.  Other medical hx includes: hx of bipolar disorder and depression, recent enucleation  Goal of Therapy:  Heparin level 0.3-0.5 units/ml Monitor platelets by anticoagulation protocol: Yes   Plan:  Initiate heparin infusion at 1060 units/hr (no bolus); check heparin level ~6 hrs after initiation of infusion Monitor CBC, platelets, heparin levels and signs/symptoms of bleeding  Gillermina Hu, PharmD, BCPS, FCCP 08/15/2018,7:06 PM

## 2018-08-15 NOTE — ED Notes (Signed)
Condom cath on and hooked to bag.

## 2018-08-15 NOTE — ED Triage Notes (Addendum)
Pt arrives from home after being found on floor by wife, has hx of dementia  And stroke with left sided weakness, , is on plavix, pt uses a cane a cane to walk , has old abrasions to left side of face from a fall on Monday, has eye removal that side from the past , wife states pt has been having increasing confusion/ irritability  and difficulty moving around ,  Not oriented to time or place , wife unsure if pt hit head , has area under left arm that is reddened and angry looking   approx 7x 4 inches square

## 2018-08-15 NOTE — ED Provider Notes (Signed)
[facility] EMERGENCY DEPARTMENT Provider Note   CSN: [id number] Arrival date & time: 08/15/18  1157     History   Chief Complaint Chief Complaint  Patient presents with  . Fall  . Altered Mental Status    HPI JAYDEEN ODOR is a 58 y.o. male hx of alcohol abuse, depression, dementia, previous orbital fracture, previous stroke on plavix, here presenting with fall, altered mental status.  Patient had a fall 5 days ago and apparently called his doctor and no imaging was done at that time.  Patient had another fall this morning.  Patient was found on the floor by his wife this morning.  She cannot tell me how he fell.  Patient has been having increased confusion since Monday.  He also apparently has some redness in the right chest that is getting progressively larger.  No fevers or chills at home.  Seems like the wife has been calling the primary care doctor, and there is a consideration for nursing home placement and the doctor also called in a wheelchair for assistance.  Patient walks with a cane at baseline.      The history is provided by the EMS personnel.   Level V caveat- AMS   Past Medical History:  Diagnosis Date  . Alcohol abuse   . ANEMIA, FOLIC ACID DEFICIENCY 0/03/4095   Qualifier: Diagnosis of  By: Danise Mina  MD, Garlon Hatchet    . Arthritis    left knee  . Bipolar disorder ([place])   . Cerebral infarct, left corona radiata ([place]) 07/17/2018  . Dementia ([place]), vascular 07/17/2018  . Depression   . DEPRESSION, MAJOR, RECURRENT 04/25/2006   Qualifier: Diagnosis of  By: Darylene Price MD, Elta Guadeloupe    . Duodenal ulcer 12/06/2014   Qualifier: Diagnosis of  By: Darylene Price MD, Elta Guadeloupe    . 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 Erectile dysfunction 02/21/2018  . Fall   . Gastrointestinal hemorrhage associated with duodenal ulcer   . GERD (gastroesophageal reflux disease)   . Hepatic encephalopathy ([place]), possible history  of 09/03/2010  . Hepatic steatosis 06/09/2010   Diagnosed by U/S 05/12/10. Likely secondary to chronic ETOH abuse. Will continue to follow.    . History of traumatic brain injury   . Hyperkalemia 12/08/2014  . Hypertension   . Left leg weakness 07/27/2015  . Orbital floor (blow-out) closed fracture ([place]) 05/25/2017   Left orbit, stable.  No surgical intervention required.  . Pneumonia   . Pure hypercholesterolemia 07/25/2018  . Stroke [facility])    a few spanning from 1997 to last year  . Tobacco dependence     Patient Active Problem List   Diagnosis Date Noted  . Left ventricular systolic dysfunction 35/32/9924  . Weight loss, non-intentional 07/25/2018  . Pure hypercholesterolemia 07/25/2018  . Visual impairment 07/25/2018  . Renal mass, right 07/25/2018  . Impaired mobility and ADLs 07/25/2018  . Depression   . Frequent falls 07/17/2018  . Dementia ([place]), vascular 07/17/2018  . Cerebral infarct, left corona radiata ([place]) 07/17/2018  . Chronic pain of left knee 07/30/2017  . Chronic pain of right knee 02/13/2016  . History of stroke   . Total incontinence   . Essential hypertension   . H/O: duodenal ulcer 12/06/2014  . Abnormality of gait 07/28/2009  . Alcoholism ([place]) 07/11/2009  . Bipolar 1 disorder, mixed, moderate ([place]) 06/27/2006  . GOUT, CHRONIC 04/25/2006  . OBESITY, NOS 04/25/2006  . TOBACCO DEPENDENCE 04/25/2006  .  History of traumatic brain injury 02/27/1995  . Encephalomalacia, bilateral frontal, traumatic 02/27/1995    Past Surgical History:  Procedure Laterality Date  . COLONOSCOPY    . ESOPHAGOGASTRODUODENOSCOPY (EGD) WITH PROPOFOL Left 12/09/2014   Procedure: ESOPHAGOGASTRODUODENOSCOPY (EGD) WITH PROPOFOL;  Surgeon: Bernette Redbirdobert Buccini, MD;  [facility];  Service: Endoscopy;  Laterality: Left;  . EVISCERATION Right 11/06/2017   Procedure: EVISCERATION REPAIR RIGHT EYE;  Surgeon: Carmela RimaPatel, Narendra, MD;  [facility];  Service: Ophthalmology;  Laterality:  Right;  . EYE SURGERY     eye lid was split        Home Medications    Prior to Admission medications   Medication Sig Start Date End Date Taking? Authorizing Provider  allopurinol (ZYLOPRIM) 300 MG tablet Take 1 tablet (300 mg total) by mouth daily. 05/28/17   Arvilla MarketWallace, Catherine Lauren, DO  amLODipine (NORVASC) 10 MG tablet Take 1 tablet (10 mg total) by mouth daily. 05/28/17   Arvilla MarketWallace, Catherine Lauren, DO  atorvastatin (LIPITOR) 80 MG tablet Take 1 tablet (80 mg total) by mouth daily. 07/25/18   McDiarmid, Leighton Roachodd D, MD  clopidogrel (PLAVIX) 75 MG tablet Take 1 tablet (75 mg total) by mouth daily. 07/25/18   McDiarmid, Leighton Roachodd D, MD  diclofenac sodium (VOLTAREN) 1 % GEL Apply 2 g topically 4 (four) times daily. 07/25/18   McDiarmid, Leighton Roachodd D, MD  folic acid (FOLVITE) 1 MG tablet TAKE 1 TABLET (1 MG TOTAL) BY MOUTH DAILY. 02/24/18   Marthenia RollingBland, Scott, DO  hydrALAZINE (APRESOLINE) 10 MG tablet Take 1 tablet (10 mg total) by mouth 3 (three) times daily. 08/08/18   McDiarmid, Leighton Roachodd D, MD  isosorbide dinitrate (ISORDIL) 20 MG tablet Take 1 tablet (20 mg total) by mouth 3 (three) times daily. 08/08/18   McDiarmid, Leighton Roachodd D, MD  lactulose (CHRONULAC) 10 GM/15ML solution Take 45 mLs (30 g total) by mouth daily as needed for mild constipation. 02/24/18   Marthenia RollingBland, Scott, DO  metoprolol tartrate (LOPRESSOR) 100 MG tablet TAKE 1 TABLET BY MOUTH TWICE A DAY 07/04/18   Ellwood Denseumball, Alison, DO  NICOTINE STEP 1 21 MG/24HR patch PLACE 1 PATCH ONTO THE SKIN DAILY. Patient taking differently: Place 21 mg onto the skin daily.  05/09/17   Arvilla MarketWallace, Catherine Lauren, DO  omega-3 acid ethyl esters (LOVAZA) 1 g capsule TAKE 1 CAPSULE (1 G TOTAL) BY MOUTH DAILY. 02/24/18   Marthenia RollingBland, Scott, DO  pantoprazole (PROTONIX) 40 MG tablet Take 1 tablet (40 mg total) by mouth daily. 10/21/17   Marthenia RollingBland, Scott, DO    Family History No family history on file.  Social History Social History   Tobacco Use  . Smoking status: Current Every Day Smoker     Packs/day: 0.50    Years: 30.00    Pack years: 15.00    Types: Cigarettes  . Smokeless tobacco: Never Used  . Tobacco comment: does not want to quitt right now  Substance Use Topics  . Alcohol use: Yes    Alcohol/week: 14.0 standard drinks    Types: 14 Cans of beer per week    Comment: " every friday I get a 5th "  . Drug use: No     Allergies   Ace inhibitors and Penicillins   Review of Systems Review of Systems  Unable to perform ROS: Mental status change  Skin: Positive for color change.  Neurological: Positive for weakness.  All other systems reviewed and are negative.    Physical Exam Updated Vital Signs BP (!) 151/95 (BP Location:  Right Arm)   Pulse 72   Temp 98.4 F (36.9 C) (Oral)   Resp 16   SpO2 98%   Physical Exam Vitals signs and nursing note reviewed.  Constitutional:      Comments: Chronically ill, altered   HENT:     Head:     Comments: Abrasion on R face with dry blood, ? Shingles around R eye. R eye appears to have previous injuries and is enucleated     Mouth/Throat:     Mouth: Mucous membranes are dry.  Eyes:     Comments: R eye enucleated   Neck:     Comments: C collar in place  Cardiovascular:     Rate and Rhythm: Normal rate and regular rhythm.     Pulses: Normal pulses.  Pulmonary:     Effort: Pulmonary effort is normal.     Breath sounds: Normal breath sounds.     Comments: R chest wall with 15 cm x 15 cm area with redness, no obvious fluctuance, ? Cellulitis  Abdominal:     General: Abdomen is flat.     Palpations: Abdomen is soft.  Musculoskeletal: Normal range of motion.  Skin:    General: Skin is warm.     Capillary Refill: Capillary refill takes less than 2 seconds.  Neurological:     Comments: Confused, A & O x 1. Moving all extremities, no obvious focal weakness   Psychiatric:     Comments: Unable       ED Treatments / Results  Labs (all labs ordered are listed, but only abnormal results are displayed) Labs  Reviewed  I-STAT CHEM 8, ED - Abnormal; Notable for the following components:      Result Value   Sodium 131 (*)    Potassium 5.2 (*)    Glucose, Bld 108 (*)    Calcium, Ion 1.09 (*)    All other components within normal limits  CULTURE, BLOOD (ROUTINE X 2)  CULTURE, BLOOD (ROUTINE X 2)  URINE CULTURE  CBC WITH DIFFERENTIAL/PLATELET  COMPREHENSIVE METABOLIC PANEL  TROPONIN I  LACTIC ACID, PLASMA  LACTIC ACID, PLASMA  PROTIME-INR  URINALYSIS, ROUTINE W REFLEX MICROSCOPIC  CBG MONITORING, ED    EKG EKG Interpretation  Date/Time:  Friday August 15 2018 12:05:04 EDT Ventricular Rate:  74 PR Interval:    QRS Duration: 109 QT Interval:  374 QTC Calculation: 415 R Axis:   -32 Text Interpretation:  Sinus rhythm Short PR interval Inferolateral infarct, old No significant change since last tracing Confirmed by Richardean CanalYao, Zeph Riebel H [phone]([id number]) on 08/15/2018 12:07:59 PM   Radiology No results found.  Procedures Procedures (including critical care time)  CRITICAL CARE Performed by: Richardean Canalavid H Jahrell Hamor   Total critical care time: 30 minutes  Critical care time was exclusive of separately billable procedures and treating other patients.  Critical care was necessary to treat or prevent imminent or life-threatening deterioration.  Critical care was time spent personally by me on the following activities: development of treatment plan with patient and/or surrogate as well as nursing, discussions with consultants, evaluation of patient's response to treatment, examination of patient, obtaining history from patient or surrogate, ordering and performing treatments and interventions, ordering and review of laboratory studies, ordering and review of radiographic studies, pulse oximetry and re-evaluation of patient's condition.   Medications Ordered in ED Medications - No data to display   Initial Impression / Assessment and Plan / ED Course  I have reviewed the triage vital signs and the  nursing notes.   Pertinent labs & imaging results that were available during my care of the patient were reviewed by me and considered in my medical decision making (see chart for details).       Jacobo Forestnthony L Zabinski is a 58 y.o. male here with confusion, AMS, R chest wall erythema, fall.  Patient has been falling and more confused patient also has an area on the right chest wall I am concerned for possible cellulitis versus an abscess versus a mass.  He is also on Plavix and may have hit his head several days ago as well as this morning. Will get CT head/neck, sepsis workup with cbc, cmp, lactate, cultures, CT chest. Will give broad spectrum abx.   4:37 PM Patient's labs showed white blood cell count of 13.  His CT showed a right chest wall abscess with no underlying rib fractures or hemothorax.  He also has multiple cervical fractures.  I discussed case with Dr. Dutch QuintPoole from neurosurgery, who states that most likely these fractures are old and recommend an Aspen collar and he will see the patient.  I also discussed case with Dr. Derrell Lollingamirez from general surgery.  He saw the patient and recommend OR drainage of the abscess but patient is on Plavix so they will need to have a Plavix washout.  Patient is a family practice patient and will be admitted for sepsis likely secondary to his abscess, cervical fractures.   Final Clinical Impressions(s) / ED Diagnoses   Final diagnoses:  None    ED Discharge Orders    None       Charlynne PanderYao, Tonya Wantz Hsienta, MD 08/15/18 [phone]

## 2018-08-15 NOTE — H&P (Addendum)
[facility] Admission History and Physical Service Pager: [phone]  Patient name: ADRIAN DINOVO Medical record number: [id number] Date of birth: [birth year] Age: 58 y.o. Gender: male  Primary Care Provider: Sherene Sires, DO Consultants: Neurosurgery, surgery Code Status: Full code  Chief Complaint: Altered mental status  Assessment and Plan: DOUGLASS DUNSHEE is a 58 y.o. male presenting with altered mental status. PMH is significant for Recent CVA (07/17/2018), vascular dementia, alcoholism, bipolar 1, encephalomalacia, HFrEF, HTN, depression.  AMS and fall likely secondary to sepsis and progressing chronic disease Per the wife's report, there was no obvious change in his mental status until she came home and found him on the floor.  His fall did lead to head trauma.  On admission, he was afebrile with normal heart rate and respiratory rate.Admission labs were notable for WBC 13.2, lactic acid 1.3, creatinine 1.1.  Some slurred speech but patient states this is chronic, and per chart review is noted during previous admissions as well. Per chart review seems that patient has had slow decline as more home health needs occurred more recently. CT head showed no evidence of acute hemorrhagic infarct.  CT chest showed a large complex fluid collection within right lateral chest wall and overlying skin thickening.  The most likely cause of his altered mental status at this time is infection or acute CVA.  Head CT shows no evidence of hemorrhagic infarct. Physical exam shows clear evidence of poorly controlled cellulitis.  IV antibiotics were started in the ED.  Surgery was consulted and recommended continued abx (vanc and zosyn) and likely I&D after 5 days off of Plavix.  Will admit inpatient for further work-up and treatment -Admit to medical telemetry, attending Dr. Gwendlyn Deutscher -Continue vancomycind (6/19-) -Continue cefepime (6/19-) -f/u EtOH level -Plan for I/D with general  surgery after 5 days off of Plavix -heparin gtt -No additional fluids at this time -Not trending lactate -Monitor vitals -SLP for bedside swallow study -PT/OT once mental status shows improvement  Recent stroke 07/17/2018 MRI in 07/17/2018 showed an acute lacunar infarct of the left corona radiata in addition to advanced chronic small vessel ischemia and progression from previous MRI in 2017.  He was started on Plavix on 07/24/2018.  The altered mental status noted above could also be due to to an acute ischemic stroke that is difficult to tell with the altered mental status.  Focal deficits are difficult to assess, appears to have normal strength and sensation. -Hold Plavix -Heparin drip per pharmacy -Low threshold for MR head  Cervical fractures Assessed by neurosurgery.  Per neurosurgery these are likely chronic although they recommend maintaining c-collar until his mental status improves. -Continue c-collar until evident improvement in mental status  Nasal bone fracture Discussed with ENT.  No need for ENT to come into the hospital urgently.  Plan for follow-up in clinic in at least 1 week to permit time for swelling to decrease.  In the meantime can take measures to reduce the risk of damage secondary to nose blowing. -Nasal saline spray/rinse 4 times daily -Avoid nose blowing -Ice for 24 hours  Vascular dementia Most recent MR head on 07/17/2018 shows clear progression of chronic small vessel ischemia compared to previous imaging in 2017.  These ischemic changes in addition to previous TBI in 1997 leading to encephalomalacia to impairment in cognition and activity.  With baseline dementia, will be difficult to assess when patient returns to baseline.  This will certainly be contributing to the primary problem of  this hospitalization which is altered mental status.  Hypertension Home medication includes amlodipine 10 mg daily, hydralazine 10 mg 3 times daily, metoprolol tartrate 100 mg  twice daily. -Holding home medication until he passes a bedside swallow  CAD/CVA/HLD Atorvastatin 80 mg taken at home. -Restart once taking p.o.  Heart failure with reduced ejection fraction Euvolemic on exam.  Last echo in 07/31/2018 showed an EF of 45 to 50%.  Home medication includes Isordil. -Restart Isordil when he passes swallow study and blood pressures are appropriate for restarting blood pressure lowering medication  GERD Protonix taken at home. -Restart Protonix once taking p.o.  Nicotine use Uses NicoDerm 21 mg patch daily at home. -Holding patch due to AMS  MDD Fluoxetine 40 mg taken at home. -Holding due to AMS  FEN/GI: N.p.o. until he passes bedside swallow Prophylaxis: Heparin drip per pharmacy  Disposition: At least 2-3 days of hospitalization anticipated prior to discharge, likely more.  History of Present Illness:  XACHARY HAMBLY is a 58 y.o. male presenting with altered mental status and fall.  His previous medical history is significant for CVA (07/17/2018), alcoholism, bipolar 1, encephalomalacia, depression.  Due to level 5 caveat: History was obtained from patient's wife.  Wife reports she received a call from her husband earlier in the day saying that he had fallen and was unable to get up.  She returned home at that time and helped her husband up off of the floor weight fallen.  She noted that he had obviously suffered an injury to his face and appeared to have an altered mental status.  She called 911 and had him brought into the emergency room for further assessment.  She reports that in the days preceding this fall, he had not exhibited any unusual signs or symptoms.  She had not noticed any fevers or lethargy, decreased appetite or shortness of breath.  She did note that he had been experiencing more falls recently.  He is received care at the family medicine clinic and arrangements had been made for physical therapy, occupational Therapy and speech therapy  to come to the house regularly.  In the ED, head and neck CT showed cervical vertebrae fractures.  Neurosurgery was consulted and noted that these were likely chronic and recommended no immediate intervention.  Neurosurgery will continue to follow.  He was noted to have large indurated patch under his right axilla and surgery was consulted for possible I&D of likely abscess.  Surgery noted that they would perform an I&D after Plavix had been held for 5 days.  He was admitted to the family medicine service for treatment of his infection and further work-up.  Review Of Systems: Per HPI with the following additions:   Review of Systems  Constitutional: Negative for chills, fever, malaise/fatigue and weight loss.  HENT: Negative for congestion, hearing loss and sore throat.   Eyes: Negative for blurred vision.  Respiratory: Negative for cough.   Cardiovascular: Negative for chest pain and palpitations.  Gastrointestinal: Negative for diarrhea, nausea and vomiting.  Genitourinary: Negative for dysuria and urgency.  Musculoskeletal: Negative for myalgias.  Skin: Positive for rash.  Neurological: Negative for tremors and headaches.  Endo/Heme/Allergies: Does not bruise/bleed easily.    Patient Active Problem List   Diagnosis Date Noted   AMS (altered mental status) 08/15/2018   Left ventricular systolic dysfunction 08/08/2018   Weight loss, non-intentional 07/25/2018   Pure hypercholesterolemia 07/25/2018   Visual impairment 07/25/2018   Renal mass, right 07/25/2018   Impaired  mobility and ADLs 07/25/2018   Depression    Frequent falls 07/17/2018   Dementia (HCC), vascular 07/17/2018   Cerebral infarct, left corona radiata (HCC) 07/17/2018   Chronic pain of left knee 07/30/2017   Chronic pain of right knee 02/13/2016   History of stroke    Total incontinence    Essential hypertension    H/O: duodenal ulcer 12/06/2014   Abnormality of gait 07/28/2009   Alcoholism  (HCC) 07/11/2009   Bipolar 1 disorder, mixed, moderate (HCC) 06/27/2006   GOUT, CHRONIC 04/25/2006   OBESITY, NOS 04/25/2006   TOBACCO DEPENDENCE 04/25/2006   History of traumatic brain injury 02/27/1995   Encephalomalacia, bilateral frontal, traumatic 02/27/1995    Past Medical History: Past Medical History:  Diagnosis Date   Alcohol abuse    ANEMIA, FOLIC ACID DEFICIENCY 08/02/2009   Qualifier: Diagnosis of  By: Sharen Hones  MD, Wynona Canes     Arthritis    left knee   Bipolar disorder (HCC)    Cerebral infarct, left corona radiata (HCC) 07/17/2018   Dementia (HCC), vascular 07/17/2018   Depression    DEPRESSION, MAJOR, RECURRENT 04/25/2006   Qualifier: Diagnosis of  By: Seleta Rhymes MD, Mark     Duodenal ulcer 12/06/2014   Qualifier: Diagnosis of  By: Seleta Rhymes MD, Wilford Sports, bilateral frontal, traumatic 02/27/1995   Bilateral traumatic encephalomalacia   Erectile dysfunction 02/21/2018   Fall    Gastrointestinal hemorrhage associated with duodenal ulcer    GERD (gastroesophageal reflux disease)    Hepatic encephalopathy (HCC), possible history of 09/03/2010   Hepatic steatosis 06/09/2010   Diagnosed by [facility] 05/12/10. Likely secondary to chronic ETOH abuse. Will continue to follow.     History of traumatic brain injury    Hyperkalemia 12/08/2014   Hypertension    Left leg weakness 07/27/2015   Orbital floor (blow-out) closed fracture (HCC) 05/25/2017   Left orbit, stable.  No surgical intervention required.   Pneumonia    Pure hypercholesterolemia 07/25/2018   Stroke [facility])    a few spanning from 1997 to last year   Tobacco dependence     Past Surgical History: Past Surgical History:  Procedure Laterality Date   COLONOSCOPY     ESOPHAGOGASTRODUODENOSCOPY (EGD) WITH PROPOFOL Left 12/09/2014   Procedure: ESOPHAGOGASTRODUODENOSCOPY (EGD) WITH PROPOFOL;  Surgeon: Bernette Redbird, MD;  Location: [facility] ENDOSCOPY;  Service: Endoscopy;  Laterality: Left;     EVISCERATION Right 11/06/2017   Procedure: EVISCERATION REPAIR RIGHT EYE;  Surgeon: Carmela Rima, MD;  Location: [facility] OR;  Service: Ophthalmology;  Laterality: Right;   EYE SURGERY     eye lid was split    Social History: Social History   Tobacco Use   Smoking status: Current Every Day Smoker    Packs/day: 0.50    Years: 30.00    Pack years: 15.00    Types: Cigarettes   Smokeless tobacco: Never Used   Tobacco comment: does not want to quitt right now  Substance Use Topics   Alcohol use: Yes    Alcohol/week: 14.0 standard drinks    Types: 14 Cans of beer per week    Comment: " every friday I get a 5th "   Drug use: No   Additional social history: Lives at home with wife Please also refer to relevant sections of EMR.  Family History: No family history on file.   Allergies and Medications: Allergies  Allergen Reactions   Ace Inhibitors Anaphylaxis and Swelling    Tongue and face became swollen  Penicillins Rash    From childhood: Has patient had a PCN reaction causing immediate rash, facial/tongue/throat swelling, SOB or lightheadedness with hypotension: Yes Has patient had a PCN reaction causing severe rash involving mucus membranes or skin necrosis: Unk Has patient had a PCN reaction that required hospitalization: Unk Has patient had a PCN reaction occurring within the last 10 years: No If all of the above answers are "NO", then may proceed with Cephalosporin use.    No current facility-administered medications on file prior to encounter.    Current Outpatient Medications on File Prior to Encounter  Medication Sig Dispense Refill   allopurinol (ZYLOPRIM) 300 MG tablet Take 1 tablet (300 mg total) by mouth daily. 30 tablet 11   amLODipine (NORVASC) 10 MG tablet Take 1 tablet (10 mg total) by mouth daily. 90 tablet 3   atorvastatin (LIPITOR) 80 MG tablet Take 1 tablet (80 mg total) by mouth daily. 30 tablet 11   clopidogrel (PLAVIX) 75 MG tablet Take  1 tablet (75 mg total) by mouth daily. 90 tablet 3   diclofenac sodium (VOLTAREN) 1 % GEL Apply 2 g topically 4 (four) times daily. 1 Tube 3   erythromycin ophthalmic ointment Place 1 application into the right eye 3 (three) times daily.     FLUoxetine (PROZAC) 40 MG capsule Take 40 mg by mouth at bedtime.     folic acid (FOLVITE) 1 MG tablet TAKE 1 TABLET (1 MG TOTAL) BY MOUTH DAILY. 30 tablet 11   hydrALAZINE (APRESOLINE) 10 MG tablet Take 1 tablet (10 mg total) by mouth 3 (three) times daily. 90 tablet 5   isosorbide dinitrate (ISORDIL) 20 MG tablet Take 1 tablet (20 mg total) by mouth 3 (three) times daily. 90 tablet 5   lactulose (CHRONULAC) 10 GM/15ML solution Take 45 mLs (30 g total) by mouth daily as needed for mild constipation. 300 mL 3   metoprolol tartrate (LOPRESSOR) 100 MG tablet TAKE 1 TABLET BY MOUTH TWICE A DAY (Patient taking differently: Take 100 mg by mouth 2 (two) times daily. ) 180 tablet 1   omega-3 acid ethyl esters (LOVAZA) 1 g capsule TAKE 1 CAPSULE (1 G TOTAL) BY MOUTH DAILY. (Patient taking differently: Take 1 g by mouth daily. TAKE 1 CAPSULE (1 G TOTAL) BY MOUTH DAILY.) 30 capsule 11   pantoprazole (PROTONIX) 40 MG tablet Take 1 tablet (40 mg total) by mouth daily. 90 tablet 3   NICOTINE STEP 1 21 MG/24HR patch PLACE 1 PATCH ONTO THE SKIN DAILY. (Patient not taking: No sig reported) 28 patch 2    Objective: BP (!) 147/99    Pulse 76    Temp 98.4 F (36.9 C) (Oral)    Resp 15    SpO2 99%  Exam: Physical Exam Constitutional:      General: He is not in acute distress.    Appearance: He is ill-appearing. He is not toxic-appearing or diaphoretic.     Comments: Resting in bed comfortably no acute distress with clear blood smears above his eye wearing a c-collar.  He is surprisingly compliant and at ease given the obvious recent trauma.  HENT:     Head: Normocephalic and atraumatic.     Nose: No rhinorrhea.     Mouth/Throat:     Mouth: Mucous membranes are  moist.     Pharynx: Oropharynx is clear.  Eyes:     Conjunctiva/sclera: Conjunctivae normal.     Comments: Left eye with a round reactive pupil and normal conjunctivae.  Neck:     Comments: Not assessed due to the presence of a c-collar which was left in place. Cardiovascular:     Rate and Rhythm: Normal rate and regular rhythm.     Pulses: Normal pulses.     Heart sounds: Normal heart sounds. No murmur.  Pulmonary:     Effort: Pulmonary effort is normal. No respiratory distress.     Breath sounds: Normal breath sounds.  Abdominal:     General: Abdomen is flat. Bowel sounds are normal.     Palpations: Abdomen is soft.  Skin:    General: Skin is warm and dry.     Capillary Refill: Capillary refill takes less than 2 seconds.     Comments: Large, 20 cm X 10 cm erythematous induration on his right chest/axilla.  Several large white comedones over indurated patch.  Tenderness to palpation around erythema.  No active discharge or bleeding.  Picture below.   Media Information    Document Information  Photos    08/15/2018 16:45  Attached To:  Hospital Encounter on 08/15/18  Source Information  Mirian MoFrank, Peter, MD   [facility]  Neuro exam: awake and alert, oriented to person and place not time (thought it was 2007 and did not know president), sensation intact     Labs and Imaging: CBC BMET  Recent Labs  Lab 08/15/18 1224 08/15/18 1233  WBC 13.2*  --   HGB 13.8 15.0  HCT 40.9 44.0  PLT 247  --    Recent Labs  Lab 08/15/18 1224 08/15/18 1233  NA 132* 131*  K 4.3 5.2*  CL 99 100  CO2 20*  --   BUN 12 16  CREATININE 1.17 1.10  GLUCOSE 106* 108*  CALCIUM 9.5  --       Ref. Range 08/15/2018 18:28  Lactic Acid, Venous Latest Ref Range: 0.5 - 1.9 mmol/L 0.7   Ct Head Wo Contrast  Result Date: 08/15/2018 CLINICAL DATA:  Fall, found down EXAM: CT HEAD WITHOUT CONTRAST CT MAXILLOFACIAL WITHOUT CONTRAST CT CERVICAL SPINE WITHOUT CONTRAST TECHNIQUE: Multidetector CT  imaging of the head, cervical spine, and maxillofacial structures were performed using the standard protocol without intravenous contrast. Multiplanar CT image reconstructions of the cervical spine and maxillofacial structures were also generated. COMPARISON:  CT brain, 05/25/2017 FINDINGS: CT HEAD FINDINGS Brain: No evidence of acute infarction, hemorrhage, hydrocephalus, extra-axial collection or mass lesion/mass effect. Extensive periventricular and deep white matter hypodensity and global volume. Vascular: No hyperdense vessel or unexpected calcification. CT FACIAL BONES FINDINGS Skull: Normal. Negative for fracture or focal lesion. Facial bones: Multiple nondisplaced fractures of the nasal bones (series 5, image 37). No displaced fractures or dislocations. Sinuses/Orbits: No acute finding. Right ocular prosthesis. Chronic fracture deformity of the left orbital floor. Other: None. CT CERVICAL SPINE FINDINGS Alignment: Normal. Skull base and vertebrae: Minimally displaced fractures of the right transverse process and vertebral foramen of the C4 and C5 vertebral bodies (series 15, image 55, 64). There is a tiny, nondisplaced corner type fracture of the anterior inferior endplate of C6 (series 16, image 32). There is a nonacute, although new compared to prior examination, fracture of the C6 spinous process (series 16, image 33). No primary bone lesion or focal pathologic process. Soft tissues and spinal canal: No prevertebral fluid or swelling. No visible canal hematoma. Disc levels:  Intact. Upper chest: Negative. Other: None. IMPRESSION: 1. No acute intracranial pathology. Extensive periventricular and deep white matter hypodensity and global volume, which likely reflects unusually  small-vessel white matter disease. 2. Multiple nondisplaced fractures of the nasal bones (series 5, image 37). No other acute displaced fracture of the facial bones. 3. Right ocular prosthesis. Chronic fracture deformity of the left  orbital floor. 4. Minimally displaced fractures of the right transverse process and vertebral foramen of the C4 and C5 vertebral bodies (series 15, image 55, 64). There is a tiny, nondisplaced corner type fracture of the anterior inferior endplate of C6 (series 16, image 32), of uncertain acuity. There is a nonacute, although new compared to prior examination, fracture of the C6 spinous process (series 16, image 33). Although new compared to prior examination dated 05/25/2017, these are of uncertain acuity given appearance and history of multiple falls. Electronically Signed   By: Lauralyn Primes M.D.   On: 08/15/2018 15:38   Ct Chest W Contrast  Result Date: 08/15/2018 CLINICAL DATA:  Status post fall. Area of redness under the left arm noted clinically. EXAM: CT CHEST WITH CONTRAST TECHNIQUE: Multidetector CT imaging of the chest was performed during intravenous contrast administration. CONTRAST:  75mL OMNIPAQUE IOHEXOL 300 MG/ML  SOLN COMPARISON:  Chest radiograph 08/15/2018 FINDINGS: Cardiovascular: No significant vascular findings. Normal heart size. No pericardial effusion. Mediastinum/Nodes: No enlarged mediastinal, hilar, or axillary lymph nodes. Thyroid gland, trachea, and esophagus demonstrate no significant findings. Lungs/Pleura: Bibasilar atelectasis. The lungs otherwise clear. Upper Abdomen: No acute abnormality. Musculoskeletal: Mild chronic appearing, with sclerotic borders, compression deformity of the superior endplate of T4 vertebral body. Complex fluid collection within the right lateral chest wall spends approximately 11 cm anterior to posterior dimension. There is mild overlying skin thickening. IMPRESSION: 1. Bibasilar atelectasis. 2. Mild chronic appearing compression deformity of the superior endplate of T4 vertebral body may be posttraumatic or degenerative. 3. Large complex fluid collection within the right lateral chest wall, with overlying skin thickening. This may represent  subcutaneous hematoma or an abscess formation. Electronically Signed   By: Ted Mcalpine M.D.   On: 08/15/2018 15:40   Ct Cervical Spine Wo Contrast  Result Date: 08/15/2018 CLINICAL DATA:  Fall, found down EXAM: CT HEAD WITHOUT CONTRAST CT MAXILLOFACIAL WITHOUT CONTRAST CT CERVICAL SPINE WITHOUT CONTRAST TECHNIQUE: Multidetector CT imaging of the head, cervical spine, and maxillofacial structures were performed using the standard protocol without intravenous contrast. Multiplanar CT image reconstructions of the cervical spine and maxillofacial structures were also generated. COMPARISON:  CT brain, 05/25/2017 FINDINGS: CT HEAD FINDINGS Brain: No evidence of acute infarction, hemorrhage, hydrocephalus, extra-axial collection or mass lesion/mass effect. Extensive periventricular and deep white matter hypodensity and global volume. Vascular: No hyperdense vessel or unexpected calcification. CT FACIAL BONES FINDINGS Skull: Normal. Negative for fracture or focal lesion. Facial bones: Multiple nondisplaced fractures of the nasal bones (series 5, image 37). No displaced fractures or dislocations. Sinuses/Orbits: No acute finding. Right ocular prosthesis. Chronic fracture deformity of the left orbital floor. Other: None. CT CERVICAL SPINE FINDINGS Alignment: Normal. Skull base and vertebrae: Minimally displaced fractures of the right transverse process and vertebral foramen of the C4 and C5 vertebral bodies (series 15, image 55, 64). There is a tiny, nondisplaced corner type fracture of the anterior inferior endplate of C6 (series 16, image 32). There is a nonacute, although new compared to prior examination, fracture of the C6 spinous process (series 16, image 33). No primary bone lesion or focal pathologic process. Soft tissues and spinal canal: No prevertebral fluid or swelling. No visible canal hematoma. Disc levels:  Intact. Upper chest: Negative. Other: None. IMPRESSION: 1. No acute intracranial  pathology.  Extensive periventricular and deep white matter hypodensity and global volume, which likely reflects unusually small-vessel white matter disease. 2. Multiple nondisplaced fractures of the nasal bones (series 5, image 37). No other acute displaced fracture of the facial bones. 3. Right ocular prosthesis. Chronic fracture deformity of the left orbital floor. 4. Minimally displaced fractures of the right transverse process and vertebral foramen of the C4 and C5 vertebral bodies (series 15, image 55, 64). There is a tiny, nondisplaced corner type fracture of the anterior inferior endplate of C6 (series 16, image 32), of uncertain acuity. There is a nonacute, although new compared to prior examination, fracture of the C6 spinous process (series 16, image 33). Although new compared to prior examination dated 05/25/2017, these are of uncertain acuity given appearance and history of multiple falls. Electronically Signed   By: Lauralyn PrimesAlex  Bibbey M.D.   On: 08/15/2018 15:38   Mr Laqueta JeanBrain W ZOWo Contrast  Result Date: 07/17/2018 CLINICAL DATA:  Cognitive changes after recent falls. History of blunt force trauma EXAM: MRI HEAD WITHOUT AND WITH CONTRAST TECHNIQUE: Multiplanar, multiecho pulse sequences of the brain and surrounding structures were obtained without and with intravenous contrast. [facility] at [address] Results: Creatinine 1.2 mg/dL. CONTRAST:  20mL MULTIHANCE GADOBENATE DIMEGLUMINE 529 MG/ML IV SOLN COMPARISON:  07/27/2015 FINDINGS: Brain: 1 cm acute infarct in the left anterior corona radiata. There is a background of advanced chronic small vessel ischemia with confluent gliosis in the pons and deep cerebral white matter, notably progressed. There have been remote lacunar infarcts in the deep gray nuclei. Bilateral inferior frontal encephalomalacia attributed to history of remote traumatic brain injury. There is generalized atrophy with ventriculomegaly,  significantly age advanced. No acute hemorrhage. No obstructive hydrocephalus or collection. The corpus callosum appears somewhat up lifted but no communicating hydrocephalus is suspected. Scattered chronic blood products attributed to prior trauma and likely from chronic hypertension (micro hemorrhages present in the brainstem and right basal ganglia) Vascular: Major flow voids are preserved. Skull and upper cervical spine: Negative for marrow lesion Sinuses/Orbits: Right enucleation with prosthesis. These results will be called to the ordering clinician or representative by the Radiologist Assistant, and communication documented in the PACS or zVision Dashboard. IMPRESSION: 1. Incidental acute lacunar infarct in the left corona radiata. 2. Background of advanced chronic small vessel ischemia which has notably progressed from 2017. 3. Age advanced atrophy. History of traumatic brain injury with bifrontal encephalomalacia. Electronically Signed   By: Marnee SpringJonathon  Watts M.D.   On: 07/17/2018 09:21   Dg Chest Port 1 View  Result Date: 08/15/2018 CLINICAL DATA:  Found down. EXAM: PORTABLE CHEST 1 VIEW COMPARISON:  07/26/2015 FINDINGS: The cardiac silhouette, mediastinal and hilar contours are within normal limits given the AP projection and portable technique. Low lung volumes with vascular crowding and streaky basilar atelectasis. No infiltrates, edema or effusions. The bony thorax is grossly intact. Remote healed left rib fractures are noted. IMPRESSION: Low lung volumes with vascular crowding and bibasilar atelectasis. Electronically Signed   By: Rudie MeyerP.  Gallerani M.D.   On: 08/15/2018 14:07   Ct Maxillofacial Wo Contrast  Result Date: 08/15/2018 CLINICAL DATA:  Fall, found down EXAM: CT HEAD WITHOUT CONTRAST CT MAXILLOFACIAL WITHOUT CONTRAST CT CERVICAL SPINE WITHOUT CONTRAST TECHNIQUE: Multidetector CT imaging of the head, cervical spine, and maxillofacial structures were performed using the standard protocol  without intravenous contrast. Multiplanar CT image reconstructions of the cervical spine and maxillofacial structures were also generated.  COMPARISON:  CT brain, 05/25/2017 FINDINGS: CT HEAD FINDINGS Brain: No evidence of acute infarction, hemorrhage, hydrocephalus, extra-axial collection or mass lesion/mass effect. Extensive periventricular and deep white matter hypodensity and global volume. Vascular: No hyperdense vessel or unexpected calcification. CT FACIAL BONES FINDINGS Skull: Normal. Negative for fracture or focal lesion. Facial bones: Multiple nondisplaced fractures of the nasal bones (series 5, image 37). No displaced fractures or dislocations. Sinuses/Orbits: No acute finding. Right ocular prosthesis. Chronic fracture deformity of the left orbital floor. Other: None. CT CERVICAL SPINE FINDINGS Alignment: Normal. Skull base and vertebrae: Minimally displaced fractures of the right transverse process and vertebral foramen of the C4 and C5 vertebral bodies (series 15, image 55, 64). There is a tiny, nondisplaced corner type fracture of the anterior inferior endplate of C6 (series 16, image 32). There is a nonacute, although new compared to prior examination, fracture of the C6 spinous process (series 16, image 33). No primary bone lesion or focal pathologic process. Soft tissues and spinal canal: No prevertebral fluid or swelling. No visible canal hematoma. Disc levels:  Intact. Upper chest: Negative. Other: None. IMPRESSION: 1. No acute intracranial pathology. Extensive periventricular and deep white matter hypodensity and global volume, which likely reflects unusually small-vessel white matter disease. 2. Multiple nondisplaced fractures of the nasal bones (series 5, image 37). No other acute displaced fracture of the facial bones. 3. Right ocular prosthesis. Chronic fracture deformity of the left orbital floor. 4. Minimally displaced fractures of the right transverse process and vertebral foramen of the C4  and C5 vertebral bodies (series 15, image 55, 64). There is a tiny, nondisplaced corner type fracture of the anterior inferior endplate of C6 (series 16, image 32), of uncertain acuity. There is a nonacute, although new compared to prior examination, fracture of the C6 spinous process (series 16, image 33). Although new compared to prior examination dated 05/25/2017, these are of uncertain acuity given appearance and history of multiple falls. Electronically Signed   By: Lauralyn Primes M.D.   On: 08/15/2018 15:38     Mirian Mo, MD 08/15/2018, 5:29 PM PGY-1, [facility] FPTS Intern pager: [phone], text pages welcome  FPTS Upper-Level Resident Addendum   I have independently interviewed and examined the patient. I have discussed the above with the original author and agree with their documentation. My edits for correction/addition/clarification are in blue. Please see also any attending notes.   Oralia Manis, DO PGY-2, [facility] 08/15/2018 10:28 PM  FPTS Service pager: [phone] (text pages welcome through [facility])

## 2018-08-15 NOTE — ED Notes (Signed)
ED TO INPATIENT HANDOFF REPORT  ED Nurse Name and Phone #:  Patty [phone]  S Name/Age/Gender [name] [name] 58 y.o. male Room/Bed: 041C/041C  Code Status   Code Status: Prior  Home/SNF/Other Home Patient oriented to: self, place and situation Is this baseline? Yes   Triage Complete: Triage complete  Chief Complaint AMS; Fall (on plavix)  Triage Note Pt arrives from home after being found on floor by wife, has hx of dementia  And stroke with left sided weakness, , is on plavix, pt uses a cane a cane to walk , has old abrasions to left side of face from a fall on Monday, has eye removal that side from the past , wife states pt has been having increasing confusion/ irritability  and difficulty moving around ,  Not oriented to time or place , wife unsure if pt hit head , has area under left arm that is reddened and angry looking   approx 7x 4 inches square    Allergies Allergies  Allergen Reactions  . Ace Inhibitors Anaphylaxis and Swelling    Tongue and face became swollen  . Penicillins Rash    From childhood: Has patient had a PCN reaction causing immediate rash, facial/tongue/throat swelling, SOB or lightheadedness with hypotension: Yes Has patient had a PCN reaction causing severe rash involving mucus membranes or skin necrosis: Unk Has patient had a PCN reaction that required hospitalization: Unk Has patient had a PCN reaction occurring within the last 10 years: No If all of the above answers are "NO", then may proceed with Cephalosporin use.     Level of Care/Admitting Diagnosis ED Disposition    ED Disposition Condition [facility] Area: [place] [100100]  Level of Care: Telemetry Medical [104]  Covid Evaluation: Screening Protocol (No Symptoms)  Diagnosis: AMS (altered mental status) [[id number]]  Admitting Physician: Matilde Haymaker [[id number]]  Attending Physician: Andrena Mews T [[id number]]  Estimated length of stay: past midnight  tomorrow  Certification:: I certify this patient will need inpatient services for at least 2 midnights  PT Class (Do Not Modify): Inpatient [101]  PT Acc Code (Do Not Modify): Private [1]       B Medical/Surgery History Past Medical History:  Diagnosis Date  . Alcohol abuse   . ANEMIA, FOLIC ACID DEFICIENCY 05/04/7562   Qualifier: Diagnosis of  By: Danise Mina  MD, Garlon Hatchet    . Arthritis    left knee  . Bipolar disorder ([place])   . Cerebral infarct, left corona radiata ([place]) 07/17/2018  . Dementia ([place]), vascular 07/17/2018  . Depression   . DEPRESSION, MAJOR, RECURRENT 04/25/2006   Qualifier: Diagnosis of  By: Darylene Price MD, Elta Guadeloupe    . Duodenal ulcer 12/06/2014   Qualifier: Diagnosis of  By: Darylene Price MD, Elta Guadeloupe    . 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 Erectile dysfunction 02/21/2018  . Fall   . Gastrointestinal hemorrhage associated with duodenal ulcer   . GERD (gastroesophageal reflux disease)   . Hepatic encephalopathy ([place]), possible history of 09/03/2010  . Hepatic steatosis 06/09/2010   Diagnosed by U/S 05/12/10. Likely secondary to chronic ETOH abuse. Will continue to follow.    . History of traumatic brain injury   . Hyperkalemia 12/08/2014  . Hypertension   . Left leg weakness 07/27/2015  . Orbital floor (blow-out) closed fracture ([place]) 05/25/2017   Left orbit, stable.  No surgical intervention required.  . Pneumonia   . Pure hypercholesterolemia  07/25/2018  . Stroke [facility](HCC)    a few spanning from 1997 to last year  . Tobacco dependence    Past Surgical History:  Procedure Laterality Date  . COLONOSCOPY    . ESOPHAGOGASTRODUODENOSCOPY (EGD) WITH PROPOFOL Left 12/09/2014   Procedure: ESOPHAGOGASTRODUODENOSCOPY (EGD) WITH PROPOFOL;  Surgeon: Bernette Redbirdobert Buccini, MD;  [facility];  Service: Endoscopy;  Laterality: Left;  . EVISCERATION Right 11/06/2017   Procedure: EVISCERATION REPAIR RIGHT EYE;  Surgeon: Carmela RimaPatel,  Narendra, MD;  [facility];  Service: Ophthalmology;  Laterality: Right;  . EYE SURGERY     eye lid was split     A IV Location/Drains/Wounds Patient Lines/Drains/Airways Status   Active Line/Drains/Airways    Name:   Placement date:   Placement time:   Site:   Days:   Peripheral IV 08/15/18 Right Antecubital   08/15/18    1243    Antecubital   less than 1   Incision (Closed) 11/06/17 Eye   11/06/17    1323     282          Intake/Output Last 24 hours  Intake/Output Summary (Last 24 hours) at 08/15/2018 1644 Last data filed at 08/15/2018 1540 Gross per 24 hour  Intake 100 ml  Output -  Net 100 ml    Labs/Imaging Results for orders placed or performed during the hospital encounter of 08/15/18 (from the past 48 hour(s))  CBG monitoring, ED     Status: None   Collection Time: 08/15/18 12:01 PM  Result Value Ref Range   Glucose-Capillary 98 70 - 99 mg/dL   Comment 1 Notify RN    Comment 2 Document in Chart   Urinalysis, Routine w reflex microscopic     Status: Abnormal   Collection Time: 08/15/18 12:08 PM  Result Value Ref Range   Color, Urine YELLOW YELLOW   APPearance CLEAR CLEAR   Specific Gravity, Urine 1.011 1.005 - 1.030   pH 5.0 5.0 - 8.0   Glucose, UA NEGATIVE NEGATIVE mg/dL   Hgb urine dipstick NEGATIVE NEGATIVE   Bilirubin Urine NEGATIVE NEGATIVE   Ketones, ur 5 (A) NEGATIVE mg/dL   Protein, ur 30 (A) NEGATIVE mg/dL   Nitrite NEGATIVE NEGATIVE   Leukocytes,Ua NEGATIVE NEGATIVE   RBC / HPF 0-5 0 - 5 RBC/hpf   WBC, UA 0-5 0 - 5 WBC/hpf   Bacteria, UA NONE SEEN NONE SEEN   Squamous Epithelial / LPF 0-5 0 - 5    Comment: Performed at [facility], [address] [place], [place] [address]  CBC with Differential/Platelet     Status: Abnormal   Collection Time: 08/15/18 12:24 PM  Result Value Ref Range   WBC 13.2 (H) 4.0 - 10.5 K/uL   RBC 4.13 (L) 4.22 - 5.81 MIL/uL   Hemoglobin 13.8 13.0 - 17.0 g/dL   HCT 19.140.9 47.839.0 - 29.552.0 %   MCV 99.0 80.0 -  100.0 fL   MCH 33.4 26.0 - 34.0 pg   MCHC 33.7 30.0 - 36.0 g/dL   RDW 62.112.9 30.811.5 - 65.715.5 %   Platelets 247 150 - 400 K/uL   nRBC 0.0 0.0 - 0.2 %   Neutrophils Relative % 82 %   Neutro Abs 10.8 (H) 1.7 - 7.7 K/uL   Lymphocytes Relative 8 %   Lymphs Abs 1.1 0.7 - 4.0 K/uL   Monocytes Relative 9 %   Monocytes Absolute 1.2 (H) 0.1 - 1.0 K/uL   Eosinophils Relative 0 %   Eosinophils  Absolute 0.0 0.0 - 0.5 K/uL   Basophils Relative 0 %   Basophils Absolute 0.0 0.0 - 0.1 K/uL   Immature Granulocytes 1 %   Abs Immature Granulocytes 0.16 (H) 0.00 - 0.07 K/uL    Comment: Performed at [facility], [address] [place], [place] [address]  Comprehensive metabolic panel     Status: Abnormal   Collection Time: 08/15/18 12:24 PM  Result Value Ref Range   Sodium 132 (L) 135 - 145 mmol/L   Potassium 4.3 3.5 - 5.1 mmol/L   Chloride 99 98 - 111 mmol/L   CO2 20 (L) 22 - 32 mmol/L   Glucose, Bld 106 (H) 70 - 99 mg/dL   BUN 12 6 - 20 mg/dL   Creatinine, Ser 6.04 0.61 - 1.24 mg/dL   Calcium 9.5 8.9 - 54.0 mg/dL   Total Protein 7.3 6.5 - 8.1 g/dL   Albumin 2.9 (L) 3.5 - 5.0 g/dL   AST 29 15 - 41 U/L   ALT 27 0 - 44 U/L   Alkaline Phosphatase 89 38 - 126 U/L   Total Bilirubin 1.0 0.3 - 1.2 mg/dL   GFR calc non Af Amer >60 >60 mL/min   GFR calc Af Amer >60 >60 mL/min   Anion gap 13 5 - 15    Comment: Performed at [facility], [address] [place], [place] [address]  Troponin I - ONCE - STAT     Status: None   Collection Time: 08/15/18 12:24 PM  Result Value Ref Range   Troponin I <0.03 <0.03 ng/mL    Comment: Performed at [facility], [address], [place], [place] [address]  Lactic acid, plasma     Status: None   Collection Time: 08/15/18 12:24 PM  Result Value Ref Range   Lactic Acid, Venous 1.3 0.5 - 1.9 mmol/L    Comment: Performed at [facility], [address], [place], [place] [address]  Protime-INR     Status: None   Collection Time: 08/15/18 12:24 PM   Result Value Ref Range   Prothrombin Time 12.6 11.4 - 15.2 seconds   INR 1.0 0.8 - 1.2    Comment: (NOTE) INR goal varies based on device and disease states. Performed at [facility], [address], [place], [place] [address]   I-stat chem 8, ED (not at [facility] or [facility])     Status: Abnormal   Collection Time: 08/15/18 12:33 PM  Result Value Ref Range   Sodium 131 (L) 135 - 145 mmol/L   Potassium 5.2 (H) 3.5 - 5.1 mmol/L   Chloride 100 98 - 111 mmol/L   BUN 16 6 - 20 mg/dL   Creatinine, Ser 7.84 0.61 - 1.24 mg/dL   Glucose, Bld 696 (H) 70 - 99 mg/dL   Calcium, Ion 2.95 (L) 1.15 - 1.40 mmol/L   TCO2 25 22 - 32 mmol/L   Hemoglobin 15.0 13.0 - 17.0 g/dL   HCT 28.4 13.2 - 44.0 %   Ct Head Wo Contrast  Result Date: 08/15/2018 CLINICAL DATA:  Fall, found down EXAM: CT HEAD WITHOUT CONTRAST CT MAXILLOFACIAL WITHOUT CONTRAST CT CERVICAL SPINE WITHOUT CONTRAST TECHNIQUE: Multidetector CT imaging of the head, cervical spine, and maxillofacial structures were performed using the standard protocol without intravenous contrast. Multiplanar CT image reconstructions of the cervical spine and maxillofacial structures were also generated. COMPARISON:  CT brain, 05/25/2017 FINDINGS: CT HEAD FINDINGS Brain: No evidence of acute infarction,  hemorrhage, hydrocephalus, extra-axial collection or mass lesion/mass effect. Extensive periventricular and deep white matter hypodensity and global volume. Vascular: No hyperdense vessel or unexpected calcification. CT FACIAL BONES FINDINGS Skull: Normal. Negative for fracture or focal lesion. Facial bones: Multiple nondisplaced fractures of the nasal bones (series 5, image 37). No displaced fractures or dislocations. Sinuses/Orbits: No acute finding. Right ocular prosthesis. Chronic fracture deformity of the left orbital floor. Other: None. CT CERVICAL SPINE FINDINGS Alignment: Normal. Skull base and vertebrae: Minimally displaced fractures of the right transverse  process and vertebral foramen of the C4 and C5 vertebral bodies (series 15, image 55, 64). There is a tiny, nondisplaced corner type fracture of the anterior inferior endplate of C6 (series 16, image 32). There is a nonacute, although new compared to prior examination, fracture of the C6 spinous process (series 16, image 33). No primary bone lesion or focal pathologic process. Soft tissues and spinal canal: No prevertebral fluid or swelling. No visible canal hematoma. Disc levels:  Intact. Upper chest: Negative. Other: None. IMPRESSION: 1. No acute intracranial pathology. Extensive periventricular and deep white matter hypodensity and global volume, which likely reflects unusually small-vessel white matter disease. 2. Multiple nondisplaced fractures of the nasal bones (series 5, image 37). No other acute displaced fracture of the facial bones. 3. Right ocular prosthesis. Chronic fracture deformity of the left orbital floor. 4. Minimally displaced fractures of the right transverse process and vertebral foramen of the C4 and C5 vertebral bodies (series 15, image 55, 64). There is a tiny, nondisplaced corner type fracture of the anterior inferior endplate of C6 (series 16, image 32), of uncertain acuity. There is a nonacute, although new compared to prior examination, fracture of the C6 spinous process (series 16, image 33). Although new compared to prior examination dated 05/25/2017, these are of uncertain acuity given appearance and history of multiple falls. Electronically Signed   By: Lauralyn PrimesAlex  Bibbey M.D.   On: 08/15/2018 15:38   Ct Chest W Contrast  Result Date: 08/15/2018 CLINICAL DATA:  Status post fall. Area of redness under the left arm noted clinically. EXAM: CT CHEST WITH CONTRAST TECHNIQUE: Multidetector CT imaging of the chest was performed during intravenous contrast administration. CONTRAST:  75mL OMNIPAQUE IOHEXOL 300 MG/ML  SOLN COMPARISON:  Chest radiograph 08/15/2018 FINDINGS: Cardiovascular: No  significant vascular findings. Normal heart size. No pericardial effusion. Mediastinum/Nodes: No enlarged mediastinal, hilar, or axillary lymph nodes. Thyroid gland, trachea, and esophagus demonstrate no significant findings. Lungs/Pleura: Bibasilar atelectasis. The lungs otherwise clear. Upper Abdomen: No acute abnormality. Musculoskeletal: Mild chronic appearing, with sclerotic borders, compression deformity of the superior endplate of T4 vertebral body. Complex fluid collection within the right lateral chest wall spends approximately 11 cm anterior to posterior dimension. There is mild overlying skin thickening. IMPRESSION: 1. Bibasilar atelectasis. 2. Mild chronic appearing compression deformity of the superior endplate of T4 vertebral body may be posttraumatic or degenerative. 3. Large complex fluid collection within the right lateral chest wall, with overlying skin thickening. This may represent subcutaneous hematoma or an abscess formation. Electronically Signed   By: Ted Mcalpineobrinka  Dimitrova M.D.   On: 08/15/2018 15:40   Ct Cervical Spine Wo Contrast  Result Date: 08/15/2018 CLINICAL DATA:  Fall, found down EXAM: CT HEAD WITHOUT CONTRAST CT MAXILLOFACIAL WITHOUT CONTRAST CT CERVICAL SPINE WITHOUT CONTRAST TECHNIQUE: Multidetector CT imaging of the head, cervical spine, and maxillofacial structures were performed using the standard protocol without intravenous contrast. Multiplanar CT image reconstructions of the cervical spine and maxillofacial structures were also generated.  COMPARISON:  CT brain, 05/25/2017 FINDINGS: CT HEAD FINDINGS Brain: No evidence of acute infarction, hemorrhage, hydrocephalus, extra-axial collection or mass lesion/mass effect. Extensive periventricular and deep white matter hypodensity and global volume. Vascular: No hyperdense vessel or unexpected calcification. CT FACIAL BONES FINDINGS Skull: Normal. Negative for fracture or focal lesion. Facial bones: Multiple nondisplaced  fractures of the nasal bones (series 5, image 37). No displaced fractures or dislocations. Sinuses/Orbits: No acute finding. Right ocular prosthesis. Chronic fracture deformity of the left orbital floor. Other: None. CT CERVICAL SPINE FINDINGS Alignment: Normal. Skull base and vertebrae: Minimally displaced fractures of the right transverse process and vertebral foramen of the C4 and C5 vertebral bodies (series 15, image 55, 64). There is a tiny, nondisplaced corner type fracture of the anterior inferior endplate of C6 (series 16, image 32). There is a nonacute, although new compared to prior examination, fracture of the C6 spinous process (series 16, image 33). No primary bone lesion or focal pathologic process. Soft tissues and spinal canal: No prevertebral fluid or swelling. No visible canal hematoma. Disc levels:  Intact. Upper chest: Negative. Other: None. IMPRESSION: 1. No acute intracranial pathology. Extensive periventricular and deep white matter hypodensity and global volume, which likely reflects unusually small-vessel white matter disease. 2. Multiple nondisplaced fractures of the nasal bones (series 5, image 37). No other acute displaced fracture of the facial bones. 3. Right ocular prosthesis. Chronic fracture deformity of the left orbital floor. 4. Minimally displaced fractures of the right transverse process and vertebral foramen of the C4 and C5 vertebral bodies (series 15, image 55, 64). There is a tiny, nondisplaced corner type fracture of the anterior inferior endplate of C6 (series 16, image 32), of uncertain acuity. There is a nonacute, although new compared to prior examination, fracture of the C6 spinous process (series 16, image 33). Although new compared to prior examination dated 05/25/2017, these are of uncertain acuity given appearance and history of multiple falls. Electronically Signed   By: Lauralyn PrimesAlex  Bibbey M.D.   On: 08/15/2018 15:38   Dg Chest Port 1 View  Result Date:  08/15/2018 CLINICAL DATA:  Found down. EXAM: PORTABLE CHEST 1 VIEW COMPARISON:  07/26/2015 FINDINGS: The cardiac silhouette, mediastinal and hilar contours are within normal limits given the AP projection and portable technique. Low lung volumes with vascular crowding and streaky basilar atelectasis. No infiltrates, edema or effusions. The bony thorax is grossly intact. Remote healed left rib fractures are noted. IMPRESSION: Low lung volumes with vascular crowding and bibasilar atelectasis. Electronically Signed   By: Rudie MeyerP.  Gallerani M.D.   On: 08/15/2018 14:07   Ct Maxillofacial Wo Contrast  Result Date: 08/15/2018 CLINICAL DATA:  Fall, found down EXAM: CT HEAD WITHOUT CONTRAST CT MAXILLOFACIAL WITHOUT CONTRAST CT CERVICAL SPINE WITHOUT CONTRAST TECHNIQUE: Multidetector CT imaging of the head, cervical spine, and maxillofacial structures were performed using the standard protocol without intravenous contrast. Multiplanar CT image reconstructions of the cervical spine and maxillofacial structures were also generated. COMPARISON:  CT brain, 05/25/2017 FINDINGS: CT HEAD FINDINGS Brain: No evidence of acute infarction, hemorrhage, hydrocephalus, extra-axial collection or mass lesion/mass effect. Extensive periventricular and deep white matter hypodensity and global volume. Vascular: No hyperdense vessel or unexpected calcification. CT FACIAL BONES FINDINGS Skull: Normal. Negative for fracture or focal lesion. Facial bones: Multiple nondisplaced fractures of the nasal bones (series 5, image 37). No displaced fractures or dislocations. Sinuses/Orbits: No acute finding. Right ocular prosthesis. Chronic fracture deformity of the left orbital floor. Other: None. CT CERVICAL SPINE FINDINGS  Alignment: Normal. Skull base and vertebrae: Minimally displaced fractures of the right transverse process and vertebral foramen of the C4 and C5 vertebral bodies (series 15, image 55, 64). There is a tiny, nondisplaced corner type  fracture of the anterior inferior endplate of C6 (series 16, image 32). There is a nonacute, although new compared to prior examination, fracture of the C6 spinous process (series 16, image 33). No primary bone lesion or focal pathologic process. Soft tissues and spinal canal: No prevertebral fluid or swelling. No visible canal hematoma. Disc levels:  Intact. Upper chest: Negative. Other: None. IMPRESSION: 1. No acute intracranial pathology. Extensive periventricular and deep white matter hypodensity and global volume, which likely reflects unusually small-vessel white matter disease. 2. Multiple nondisplaced fractures of the nasal bones (series 5, image 37). No other acute displaced fracture of the facial bones. 3. Right ocular prosthesis. Chronic fracture deformity of the left orbital floor. 4. Minimally displaced fractures of the right transverse process and vertebral foramen of the C4 and C5 vertebral bodies (series 15, image 55, 64). There is a tiny, nondisplaced corner type fracture of the anterior inferior endplate of C6 (series 16, image 32), of uncertain acuity. There is a nonacute, although new compared to prior examination, fracture of the C6 spinous process (series 16, image 33). Although new compared to prior examination dated 05/25/2017, these are of uncertain acuity given appearance and history of multiple falls. Electronically Signed   By: Lauralyn Primes M.D.   On: 08/15/2018 15:38    Pending Labs Unresulted Labs (From admission, onward)    Start     Ordered   08/15/18 1547  SARS Coronavirus 2 (CEPHEID - Performed in [facility] hospital lab), Hosp Order  (Asymptomatic Patients Labs)  Once,   STAT    Question:  Rule Out  Answer:  Yes   08/15/18 1546   08/15/18 1208  Urine culture  ONCE - STAT,   STAT     08/15/18 1207   08/15/18 1206  Lactic acid, plasma  Now then every 2 hours,   STAT     08/15/18 1205   08/15/18 1206  Blood culture (routine x 2)  BLOOD CULTURE X 2,   STAT     08/15/18  1205          Vitals/Pain Today's Vitals   08/15/18 1446 08/15/18 1500 08/15/18 1530 08/15/18 1551  BP: (!) 150/109 (!) 146/99 (!) 147/99   Pulse: 81 78 76   Resp: (!) Temp:      TempSrc:      SpO2: 98% 98% 99%   PainSc:    0-No pain    Isolation Precautions No active isolations  Medications Medications  vancomycin (VANCOCIN) IVPB 1000 mg/200 mL premix (1,000 mg Intravenous New Bag/Given 08/15/18 1551)  ceFEPIme (MAXIPIME) 2 g in sodium chloride 0.9 % 100 mL IVPB (0 g Intravenous Stopped 08/15/18 1540)  iohexol (OMNIPAQUE) 300 MG/ML solution 75 mL (75 mLs Intravenous Contrast Given 08/15/18 1415)    Mobility walks with device High fall risk   Focused Assessments    R Recommendations: See Admitting Provider Note  Report given to:   Additional Notes:

## 2018-08-15 NOTE — Progress Notes (Signed)
Patient with altered mental status and multiple falls.  Patient apparently unable to give history.  CT scan of head is negative.  CT scan cervical spine demonstrates old cervical fractures involving his C6 spinous process and C5 transverse process.  From my standpoint these appear to be chronic injuries.  I would recommend placing the patient in a cervical collar until his mental status clears to the point where he can be clinically cleared with regard to his neck.  I will follow.

## 2018-08-15 NOTE — ED Notes (Signed)
Back from xray, Nurse nav called wife

## 2018-08-15 NOTE — ED Notes (Signed)
Pt has stated several times, "im ready to leave. I want to go home." Pt was told that his family is worried about him since all of his he has fallen multiple times. Tech notified Cindy,RN.

## 2018-08-16 DIAGNOSIS — R4182 Altered mental status, unspecified: Secondary | ICD-10-CM

## 2018-08-16 DIAGNOSIS — S0292XA Unspecified fracture of facial bones, initial encounter for closed fracture: Secondary | ICD-10-CM

## 2018-08-16 DIAGNOSIS — E871 Hypo-osmolality and hyponatremia: Secondary | ICD-10-CM

## 2018-08-16 DIAGNOSIS — S129XXA Fracture of neck, unspecified, initial encounter: Secondary | ICD-10-CM

## 2018-08-16 DIAGNOSIS — L02213 Cutaneous abscess of chest wall: Secondary | ICD-10-CM

## 2018-08-16 LAB — URINE CULTURE: Culture: NO GROWTH

## 2018-08-16 LAB — BASIC METABOLIC PANEL
Anion gap: 18 — ABNORMAL HIGH (ref 5–15)
BUN: 13 mg/dL (ref 6–20)
CO2: 13 mmol/L — ABNORMAL LOW (ref 22–32)
Calcium: 9.5 mg/dL (ref 8.9–10.3)
Chloride: 102 mmol/L (ref 98–111)
Creatinine, Ser: 1.02 mg/dL (ref 0.61–1.24)
GFR calc Af Amer: >60 mL/min (ref >60)
GFR calc non Af Amer: >60 mL/min (ref >60)
Glucose, Bld: 99 mg/dL (ref 70–99)
Potassium: 4.5 mmol/L (ref 3.5–5.1)
Sodium: 133 mmol/L — ABNORMAL LOW (ref 135–145)

## 2018-08-16 LAB — RAPID URINE DRUG SCREEN, HOSP PERFORMED
Amphetamines: NOT DETECTED
Barbiturates: NOT DETECTED
Benzodiazepines: NOT DETECTED
Cocaine: NOT DETECTED
Opiates: NOT DETECTED
Tetrahydrocannabinol: POSITIVE — AB

## 2018-08-16 LAB — TSH: TSH: 2.077 u[IU]/mL (ref 0.350–4.500)

## 2018-08-16 LAB — OSMOLALITY, URINE: Osmolality, Ur: 406 mOsm/kg (ref 300–900)

## 2018-08-16 LAB — CBC
HCT: 47.1 % (ref 39.0–52.0)
Hemoglobin: 15.4 g/dL (ref 13.0–17.0)
MCH: 32.8 pg (ref 26.0–34.0)
MCHC: 32.7 g/dL (ref 30.0–36.0)
MCV: 100.2 fL — ABNORMAL HIGH (ref 80.0–100.0)
Platelets: 203 10*3/uL (ref 150–400)
RBC: 4.7 MIL/uL (ref 4.22–5.81)
RDW: 12.9 % (ref 11.5–15.5)
WBC: 13.2 10*3/uL — ABNORMAL HIGH (ref 4.0–10.5)
nRBC: 0 % (ref 0.0–0.2)

## 2018-08-16 LAB — SODIUM, URINE, RANDOM: Sodium, Ur: 76 mmol/L

## 2018-08-16 LAB — ETHANOL: Alcohol, Ethyl (B): <10 mg/dL (ref <10)

## 2018-08-16 LAB — HEPARIN LEVEL (UNFRACTIONATED)
Heparin Unfractionated: 0.14 IU/mL — ABNORMAL LOW (ref 0.30–0.70)
Heparin Unfractionated: 0.29 IU/mL — ABNORMAL LOW (ref 0.30–0.70)

## 2018-08-16 LAB — AMMONIA: Ammonia: 34 umol/L (ref 9–35)

## 2018-08-16 MED ORDER — VITAMIN B-1 100 MG PO TABS
100.0000 mg | ORAL_TABLET | Freq: Every day | ORAL | Status: DC
  Administered 2018-08-16 – 2018-08-20 (×3): 100 mg via ORAL
  Filled 2018-08-16 (×4): qty 1

## 2018-08-16 MED ORDER — HEPARIN SODIUM (PORCINE) 5000 UNIT/ML IJ SOLN
5000.0000 [IU] | Freq: Three times a day (TID) | INTRAMUSCULAR | Status: DC
  Administered 2018-08-16 – 2018-08-19 (×9): 5000 [IU] via SUBCUTANEOUS
  Filled 2018-08-16 (×9): qty 1

## 2018-08-16 MED ORDER — LORAZEPAM 1 MG PO TABS: 1.0000 mg | ORAL_TABLET | Freq: Four times a day (QID) | ORAL | Status: DC | PRN

## 2018-08-16 MED ORDER — THIAMINE HCL 100 MG/ML IJ SOLN: 100.0000 mg | Freq: Every day | INTRAMUSCULAR | Status: DC

## 2018-08-16 MED ORDER — ADULT MULTIVITAMIN W/MINERALS CH
1.0000 | ORAL_TABLET | Freq: Every day | ORAL | Status: DC
  Administered 2018-08-16 – 2018-08-20 (×3): 1 via ORAL
  Filled 2018-08-16 (×4): qty 1

## 2018-08-16 MED ORDER — BACITRACIN ZINC 500 UNIT/GM EX OINT
TOPICAL_OINTMENT | Freq: Every day | CUTANEOUS | Status: DC
  Administered 2018-08-17 – 2018-08-20 (×4): 31.5556 via TOPICAL
  Filled 2018-08-16: qty 28.4

## 2018-08-16 MED ORDER — LORAZEPAM 2 MG/ML IJ SOLN
1.0000 mg | Freq: Four times a day (QID) | INTRAMUSCULAR | Status: DC | PRN
  Administered 2018-08-16: 1 mg via INTRAVENOUS
  Filled 2018-08-16: qty 1

## 2018-08-16 MED ORDER — FOLIC ACID 1 MG PO TABS
1.0000 mg | ORAL_TABLET | Freq: Every day | ORAL | Status: DC
  Administered 2018-08-16 – 2018-08-20 (×3): 1 mg via ORAL
  Filled 2018-08-16 (×4): qty 1

## 2018-08-16 NOTE — Progress Notes (Signed)
[place] for: Heparin Indication: h/o stroke - holding plavix?  Allergies  Allergen Reactions  . Ace Inhibitors Anaphylaxis and Swelling    Tongue and face became swollen  . Penicillins Rash    From childhood: Has patient had a PCN reaction causing immediate rash, facial/tongue/throat swelling, SOB or lightheadedness with hypotension: Yes Has patient had a PCN reaction causing severe rash involving mucus membranes or skin necrosis: Unk Has patient had a PCN reaction that required hospitalization: Unk Has patient had a PCN reaction occurring within the last 10 years: No If all of the above answers are "NO", then may proceed with Cephalosporin use.     Patient Measurements: Height: 5\' 8"  (172.7 cm) Weight: 209 lb 14.1 oz (95.2 kg) IBW/kg (Calculated) : 68.4 Heparin Dosing Weight: 88.4 kg  Vital Signs: Temp: 96.8 F (36 C) (06/20 1209) Temp Source: Axillary (06/20 1209) BP: 144/98 (06/20 1209) Pulse Rate: 71 (06/20 1209)  Labs: Recent Labs    08/15/18 1224 08/15/18 1233 08/15/18 1919 08/16/18 0031 08/16/18 1001 08/16/18 1101  HGB 13.8 15.0  --   --  15.4  --   HCT 40.9 44.0  --   --  47.1  --   PLT 247  --   --   --  203  --   APTT  --   --  32  --   --   --   LABPROT 12.6  --   --   --   --   --   INR 1.0  --   --   --   --   --   HEPARINUNFRC  --   --   --  0.14*  --  0.29*  CREATININE 1.17 1.10  --   --  1.02  --   TROPONINI <0.03  --   --   --   --   --     Estimated Creatinine Clearance: 87.2 mL/min (by C-G formula based on SCr of 1.02 mg/dL).   Assessment: 58 yr old male with h/o CVA, Plavix on hold while awaiting I & D, for heparin - will dose conservatively since patient does not have an indication for full anticoagulation  Hep lvl slightly low 0.29  CBC stable  Goal of Therapy:  Heparin level 0.3-0.5 units/ml Monitor platelets by anticoagulation protocol: Yes   Plan:  Increase Heparin 1350 units/hr Check  heparin level in 8 hours.   Levester Fresh, PharmD, BCPS, BCCCP Clinical Pharmacist [phone]  Please check AMION for all [place] numbers  08/16/2018 1:08 PM

## 2018-08-16 NOTE — Progress Notes (Signed)
PT Cancellation Note  Patient Details Name: [name] [name] MRN: [id number] DOB: 1960/10/08   Cancelled Treatment:    Reason Eval/Treat Not Completed: Patient declined, no reason specified.  Pt stated multiple time "Cause, I don't want to. (work with PT)".  Will try back as able 6/21. 08/16/2018  [name] [name], [place] [phone]  (pager) [phone]  (office)   [name] [name] 08/16/2018, 5:16 PM

## 2018-08-16 NOTE — Progress Notes (Signed)
[place] for: Heparin Indication: h/o stroke   Allergies  Allergen Reactions  . Ace Inhibitors Anaphylaxis and Swelling    Tongue and face became swollen  . Penicillins Rash    From childhood: Has patient had a PCN reaction causing immediate rash, facial/tongue/throat swelling, SOB or lightheadedness with hypotension: Yes Has patient had a PCN reaction causing severe rash involving mucus membranes or skin necrosis: Unk Has patient had a PCN reaction that required hospitalization: Unk Has patient had a PCN reaction occurring within the last 10 years: No If all of the above answers are "NO", then may proceed with Cephalosporin use.     Patient Measurements: Height: 5\' 8"  (172.7 cm) Weight: 209 lb 14.1 oz (95.2 kg) IBW/kg (Calculated) : 68.4 Heparin Dosing Weight: 88.4 kg  Vital Signs: Temp: 97.5 F (36.4 C) (06/19 2316) Temp Source: Oral (06/19 2316) BP: 148/108 (06/19 2316) Pulse Rate: 77 (06/19 2316)  Labs: Recent Labs    08/15/18 1224 08/15/18 1233 08/15/18 1919 08/16/18 0031  HGB 13.8 15.0  --   --   HCT 40.9 44.0  --   --   PLT 247  --   --   --   APTT  --   --  32  --   LABPROT 12.6  --   --   --   INR 1.0  --   --   --   HEPARINUNFRC  --   --   --  0.14*  CREATININE 1.17 1.10  --   --   TROPONINI <0.03  --   --   --     Estimated Creatinine Clearance: 80.9 mL/min (by C-G formula based on SCr of 1.1 mg/dL).   Assessment: 58 yr old male with h/o CVA, Plavix on hold while awaiting I & D, for heparin  Goal of Therapy:  Heparin level 0.3-0.5 units/ml Monitor platelets by anticoagulation protocol: Yes   Plan:  Increase Heparin 1250 units/hr Check heparin level in 8 hours.   Phillis Knack, PharmD, BCPS  08/16/2018,1:32 AM

## 2018-08-16 NOTE — Progress Notes (Signed)
Patient was placed on Heparin gtt for VTE coverage. Currently holding Plavix in preparation for stroke. Patient does not have atrial fibrillation or other high clotting state such as cancer. Will discontinue heparin gtt and put on heparin VTE prophylaxis.

## 2018-08-16 NOTE — Progress Notes (Signed)
Family Medicine Teaching Service Daily Progress Note Intern Pager: [phone]  Patient name: [name] Forestnthony L Casagrande Medical record number: [id number] Date of birth: 03/01/1960 Age: 58 y.o. Gender: male  Primary Care Provider: Marthenia RollingBland, Scott, DO Consultants: Neurosurgery, ENT, General Surgery Code Status: Full  Pt Overview and Major Events to Date:  [name] [name] is a 58 y.o. male presenting with altered mental status. PMH is significant for Recent CVA (07/17/2018), vascular dementia, alcoholism, bipolar 1, encephalomalacia, HFrEF, HTN, depression.  08/15/2018 - Admission   Assessment and Plan:  AMS & Fall likely 2/2 right, lateral chest abscess  Also likely has low threshold given h/o Vascular dementia and alcoholism. O/N patient aggitated and pulled off tele and IV. He was given a Comptrollersitter and CIWA monitoring was started. This AM patient's AMS is slightly improved. A&O to self only. Slightly elevated BP. AM labs pending. Abscess well within marked borders.  Need to wait 5 days  w/o plaxix (6/19-6/24) use before he can go to surgery for I&D. Plan to keep broad spectrum abx until he has complete resolution of AMS.  -Continue vancomycin (6/19-) & cefepime (6/19-) -Plan for I/D with general surgery after 5 days off of Plavix -heparin gtt -SLP for bedside swallow study -PT/OT/SLP -Continue robotic safety sitter, mits  Mild Hyponatremia AM Na pending. Euvolemic on exam. Suspect SIADH in setting of infection. Possibly beer potomania in setting of alcoholism. TSH pending - Urine Sodium - Urine Osmal - fluid restrict when able to tolerate PO  Alcohol Abuse  Alcohol Withdrawal On admission, unable to obtain history of last drink or how much he is currently drinking before admission, but known history of abuse. EtOH level neg. Last CIWA score 7 @ 0600 on 6/20. Did get ativan 1 mg x1 due to aggitation, but was improperly dosing of CIWA protocol. Ativan has been discontinued for now. Plan to monitor CIWA  and mental status for now.  - CIWA - hold ativan given AMS unless vitals & CIWA support withdrawl  Hypertension Home medication includes amlodipine 10 mg daily, hydralazine 10 mg 3 times daily, metoprolol tartrate 100 mg twice daily. -Holding home medication until he passes a bedside swallow  Recent stroke 07/17/2018  Vascular Dementia MRI in 07/17/2018 showed an acute lacunar infarct of the left corona radiata in addition to advanced chronic small vessel ischemia and progression from previous MRI in 2017.  These ischemic changes in addition to previous TBI in 1997 leading to encephalomalacia to impairment in cognition and activity.  With baseline dementia, will be difficult to assess when patient returns to baseline.  This will certainly be contributing to the primary problem of this hospitalization which is altered mental status. He was started on Plavix on 07/24/2018. Focal deficits are difficult to assess, appears to have normal strength and sensation. -Hold Plavix -Heparin drip per pharmacy -Low threshold for MR head -monitor BP  Cervical fractures Assessed by neurosurgery.  Per neurosurgery these are likely chronic and able to move w/o likely injury. This is good as patient is not tolerating aspen collar at this time.  - NS consulted, appreciate recommendations - Monitor for neurologic deficit - May keep collar off for now  Nasal bone fracture Discussed with ENT.  No need for ENT to come into the hospital urgently.  Plan for follow-up in clinic in at least 1 week to permit time for swelling to decrease.  In the meantime can take measures to reduce the risk of damage secondary to nose blowing. -Nasal saline spray/rinse 4  times daily -Avoid nose blowing -Ice for 24 hours  CAD/CVA/HLD Atorvastatin 80 mg taken at home. -Restart once taking p.o. -holding plavix as above  Heart failure with reduced ejection fraction Euvolemic on exam.  Last echo in 07/31/2018 showed an EF of 45 to  50%.  Home medication includes Isordil. -Restart Isordil when he passes swallow study and blood pressures are appropriate for restarting blood pressure lowering medication  GERD Protonix taken at home. -Restart Protonix once taking p.o.  Nicotine use Uses NicoDerm 21 mg patch daily at home. -Holding patch due to AMS  MDD Fluoxetine 40 mg taken at home. -Holding due to AMS  FEN/GI: N.p.o. until he passes bedside swallow Prophylaxis: Heparin drip per pharmacy  Disposition: pending improvement in mental status   Subjective:  Aggitated overnight, trying to remove tele and IV. Safety sitter started with improvement. Did get ativan 1 mg x1 due to aggitation, but was improperly dosing of CIWA protocol.   Objective: Temp:  [97.2 F (36.2 C)-100.1 F (37.8 C)] 97.2 F (36.2 C) (06/20 0804) Pulse Rate:  [70-81] 78 (06/20 0804) Resp:  [13-22] 20 (06/20 0804) BP: (137-154)/(94-109) 154/99 (06/20 0804) SpO2:  [97 %-100 %] 100 % (06/20 0804) Weight:  [95.2 kg] 95.2 kg (06/19 1816) Physical Exam: General: NAD, slightly aggitated this AM, clear facial trauma on left side from fall Cardiovascular: could not assess due to patient aggitation Respiratory: could not assess due to patient aggitation Abdomen: could not assess due to patient aggitation Extremities: could not assess due to patient aggitation Skin: Abscess on right lateral thorax/abdomen. Erythematous, well within marked borders  Laboratory: Recent Labs  Lab 08/15/18 1224 08/15/18 1233  WBC 13.2*  --   HGB 13.8 15.0  HCT 40.9 44.0  PLT 247  --    Recent Labs  Lab 08/15/18 1224 08/15/18 1233  NA 132* 131*  K 4.3 5.2*  CL 99 100  CO2 20*  --   BUN 12 16  CREATININE 1.17 1.10  CALCIUM 9.5  --   PROT 7.3  --   BILITOT 1.0  --   ALKPHOS 89  --   ALT 27  --   AST 29  --   GLUCOSE 106* 108*      Imaging/Diagnostic Tests: Ct Head Wo Contrast  Result Date: 08/15/2018 CLINICAL DATA:  Fall, found down  EXAM: CT HEAD WITHOUT CONTRAST CT MAXILLOFACIAL WITHOUT CONTRAST CT CERVICAL SPINE WITHOUT CONTRAST TECHNIQUE: Multidetector CT imaging of the head, cervical spine, and maxillofacial structures were performed using the standard protocol without intravenous contrast. Multiplanar CT image reconstructions of the cervical spine and maxillofacial structures were also generated. COMPARISON:  CT brain, 05/25/2017 FINDINGS: CT HEAD FINDINGS Brain: No evidence of acute infarction, hemorrhage, hydrocephalus, extra-axial collection or mass lesion/mass effect. Extensive periventricular and deep white matter hypodensity and global volume. Vascular: No hyperdense vessel or unexpected calcification. CT FACIAL BONES FINDINGS Skull: Normal. Negative for fracture or focal lesion. Facial bones: Multiple nondisplaced fractures of the nasal bones (series 5, image 37). No displaced fractures or dislocations. Sinuses/Orbits: No acute finding. Right ocular prosthesis. Chronic fracture deformity of the left orbital floor. Other: None. CT CERVICAL SPINE FINDINGS Alignment: Normal. Skull base and vertebrae: Minimally displaced fractures of the right transverse process and vertebral foramen of the C4 and C5 vertebral bodies (series 15, image 55, 64). There is a tiny, nondisplaced corner type fracture of the anterior inferior endplate of C6 (series 16, image 32). There is a nonacute, although new compared to prior  examination, fracture of the C6 spinous process (series 16, image 33). No primary bone lesion or focal pathologic process. Soft tissues and spinal canal: No prevertebral fluid or swelling. No visible canal hematoma. Disc levels:  Intact. Upper chest: Negative. Other: None. IMPRESSION: 1. No acute intracranial pathology. Extensive periventricular and deep white matter hypodensity and global volume, which likely reflects unusually small-vessel white matter disease. 2. Multiple nondisplaced fractures of the nasal bones (series 5, image  37). No other acute displaced fracture of the facial bones. 3. Right ocular prosthesis. Chronic fracture deformity of the left orbital floor. 4. Minimally displaced fractures of the right transverse process and vertebral foramen of the C4 and C5 vertebral bodies (series 15, image 55, 64). There is a tiny, nondisplaced corner type fracture of the anterior inferior endplate of C6 (series 16, image 32), of uncertain acuity. There is a nonacute, although new compared to prior examination, fracture of the C6 spinous process (series 16, image 33). Although new compared to prior examination dated 05/25/2017, these are of uncertain acuity given appearance and history of multiple falls. Electronically Signed   By: Lauralyn Primes M.D.   On: 08/15/2018 15:38   Ct Chest W Contrast  Result Date: 08/15/2018 CLINICAL DATA:  Status post fall. Area of redness under the left arm noted clinically. EXAM: CT CHEST WITH CONTRAST TECHNIQUE: Multidetector CT imaging of the chest was performed during intravenous contrast administration. CONTRAST:  75mL OMNIPAQUE IOHEXOL 300 MG/ML  SOLN COMPARISON:  Chest radiograph 08/15/2018 FINDINGS: Cardiovascular: No significant vascular findings. Normal heart size. No pericardial effusion. Mediastinum/Nodes: No enlarged mediastinal, hilar, or axillary lymph nodes. Thyroid gland, trachea, and esophagus demonstrate no significant findings. Lungs/Pleura: Bibasilar atelectasis. The lungs otherwise clear. Upper Abdomen: No acute abnormality. Musculoskeletal: Mild chronic appearing, with sclerotic borders, compression deformity of the superior endplate of T4 vertebral body. Complex fluid collection within the right lateral chest wall spends approximately 11 cm anterior to posterior dimension. There is mild overlying skin thickening. IMPRESSION: 1. Bibasilar atelectasis. 2. Mild chronic appearing compression deformity of the superior endplate of T4 vertebral body may be posttraumatic or degenerative. 3.  Large complex fluid collection within the right lateral chest wall, with overlying skin thickening. This may represent subcutaneous hematoma or an abscess formation. Electronically Signed   By: Ted Mcalpine M.D.   On: 08/15/2018 15:40   Ct Cervical Spine Wo Contrast  Result Date: 08/15/2018 CLINICAL DATA:  Fall, found down EXAM: CT HEAD WITHOUT CONTRAST CT MAXILLOFACIAL WITHOUT CONTRAST CT CERVICAL SPINE WITHOUT CONTRAST TECHNIQUE: Multidetector CT imaging of the head, cervical spine, and maxillofacial structures were performed using the standard protocol without intravenous contrast. Multiplanar CT image reconstructions of the cervical spine and maxillofacial structures were also generated. COMPARISON:  CT brain, 05/25/2017 FINDINGS: CT HEAD FINDINGS Brain: No evidence of acute infarction, hemorrhage, hydrocephalus, extra-axial collection or mass lesion/mass effect. Extensive periventricular and deep white matter hypodensity and global volume. Vascular: No hyperdense vessel or unexpected calcification. CT FACIAL BONES FINDINGS Skull: Normal. Negative for fracture or focal lesion. Facial bones: Multiple nondisplaced fractures of the nasal bones (series 5, image 37). No displaced fractures or dislocations. Sinuses/Orbits: No acute finding. Right ocular prosthesis. Chronic fracture deformity of the left orbital floor. Other: None. CT CERVICAL SPINE FINDINGS Alignment: Normal. Skull base and vertebrae: Minimally displaced fractures of the right transverse process and vertebral foramen of the C4 and C5 vertebral bodies (series 15, image 55, 64). There is a tiny, nondisplaced corner type fracture of the anterior inferior endplate  of C6 (series 16, image 32). There is a nonacute, although new compared to prior examination, fracture of the C6 spinous process (series 16, image 33). No primary bone lesion or focal pathologic process. Soft tissues and spinal canal: No prevertebral fluid or swelling. No visible  canal hematoma. Disc levels:  Intact. Upper chest: Negative. Other: None. IMPRESSION: 1. No acute intracranial pathology. Extensive periventricular and deep white matter hypodensity and global volume, which likely reflects unusually small-vessel white matter disease. 2. Multiple nondisplaced fractures of the nasal bones (series 5, image 37). No other acute displaced fracture of the facial bones. 3. Right ocular prosthesis. Chronic fracture deformity of the left orbital floor. 4. Minimally displaced fractures of the right transverse process and vertebral foramen of the C4 and C5 vertebral bodies (series 15, image 55, 64). There is a tiny, nondisplaced corner type fracture of the anterior inferior endplate of C6 (series 16, image 32), of uncertain acuity. There is a nonacute, although new compared to prior examination, fracture of the C6 spinous process (series 16, image 33). Although new compared to prior examination dated 05/25/2017, these are of uncertain acuity given appearance and history of multiple falls. Electronically Signed   By: Eddie Candle M.D.   On: 08/15/2018 15:38   Dg Chest Port 1 View  Result Date: 08/15/2018 CLINICAL DATA:  Found down. EXAM: PORTABLE CHEST 1 VIEW COMPARISON:  07/26/2015 FINDINGS: The cardiac silhouette, mediastinal and hilar contours are within normal limits given the AP projection and portable technique. Low lung volumes with vascular crowding and streaky basilar atelectasis. No infiltrates, edema or effusions. The bony thorax is grossly intact. Remote healed left rib fractures are noted. IMPRESSION: Low lung volumes with vascular crowding and bibasilar atelectasis. Electronically Signed   By: Marijo Sanes M.D.   On: 08/15/2018 14:07   Ct Maxillofacial Wo Contrast  Result Date: 08/15/2018 CLINICAL DATA:  Fall, found down EXAM: CT HEAD WITHOUT CONTRAST CT MAXILLOFACIAL WITHOUT CONTRAST CT CERVICAL SPINE WITHOUT CONTRAST TECHNIQUE: Multidetector CT imaging of the head,  cervical spine, and maxillofacial structures were performed using the standard protocol without intravenous contrast. Multiplanar CT image reconstructions of the cervical spine and maxillofacial structures were also generated. COMPARISON:  CT brain, 05/25/2017 FINDINGS: CT HEAD FINDINGS Brain: No evidence of acute infarction, hemorrhage, hydrocephalus, extra-axial collection or mass lesion/mass effect. Extensive periventricular and deep white matter hypodensity and global volume. Vascular: No hyperdense vessel or unexpected calcification. CT FACIAL BONES FINDINGS Skull: Normal. Negative for fracture or focal lesion. Facial bones: Multiple nondisplaced fractures of the nasal bones (series 5, image 37). No displaced fractures or dislocations. Sinuses/Orbits: No acute finding. Right ocular prosthesis. Chronic fracture deformity of the left orbital floor. Other: None. CT CERVICAL SPINE FINDINGS Alignment: Normal. Skull base and vertebrae: Minimally displaced fractures of the right transverse process and vertebral foramen of the C4 and C5 vertebral bodies (series 15, image 55, 64). There is a tiny, nondisplaced corner type fracture of the anterior inferior endplate of C6 (series 16, image 32). There is a nonacute, although new compared to prior examination, fracture of the C6 spinous process (series 16, image 33). No primary bone lesion or focal pathologic process. Soft tissues and spinal canal: No prevertebral fluid or swelling. No visible canal hematoma. Disc levels:  Intact. Upper chest: Negative. Other: None. IMPRESSION: 1. No acute intracranial pathology. Extensive periventricular and deep white matter hypodensity and global volume, which likely reflects unusually small-vessel white matter disease. 2. Multiple nondisplaced fractures of the nasal bones (series 5,  image 37). No other acute displaced fracture of the facial bones. 3. Right ocular prosthesis. Chronic fracture deformity of the left orbital floor. 4.  Minimally displaced fractures of the right transverse process and vertebral foramen of the C4 and C5 vertebral bodies (series 15, image 55, 64). There is a tiny, nondisplaced corner type fracture of the anterior inferior endplate of C6 (series 16, image 32), of uncertain acuity. There is a nonacute, although new compared to prior examination, fracture of the C6 spinous process (series 16, image 33). Although new compared to prior examination dated 05/25/2017, these are of uncertain acuity given appearance and history of multiple falls. Electronically Signed   By: Lauralyn PrimesAlex  Bibbey M.D.   On: 08/15/2018 15:38    Garnette Gunnerhompson, Aarav Burgett B, MD 08/16/2018, 8:06 AM PGY-2, [facility] FPTS Intern pager: [phone], text pages welcome

## 2018-08-16 NOTE — Consult Note (Signed)
Reason for Consult: Possible cervical spine fractures. Referring Physician: Emergency department  [name] [name] is an 58 y.o. male.  HPI: 58 year old male admitted with altered mental status and evidence of a significant chest abscess.  Patient with multiple medical improvements including prior CVA, dementia, alcoholism and mental illness.  Patient admitted with increased confusion and falling spells.  Work-up in the emergency department demonstrated possible cervical spine fractures.  I been asked to evaluate these.  Patient denies headache.  He denies neck pain.  He has no radiating numbness paresthesias or weakness. Past Medical History:  Diagnosis Date  . Alcohol abuse   . ANEMIA, FOLIC ACID DEFICIENCY 08/02/2009   Qualifier: Diagnosis of  By: Sharen HonesGutierrez  MD, Wynona CanesJavier    . Arthritis    left knee  . Bipolar disorder (HCC)   . Cerebral infarct, left corona radiata (HCC) 07/17/2018  . Dementia (HCC), vascular 07/17/2018  . Depression   . DEPRESSION, MAJOR, RECURRENT 04/25/2006   Qualifier: Diagnosis of  By: Seleta Rhymesowand MD, Loraine LericheMark    . Duodenal ulcer 12/06/2014   Qualifier: Diagnosis of  By: Seleta Rhymesowand MD, Loraine LericheMark    . 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 Erectile dysfunction 02/21/2018  . Fall   . Gastrointestinal hemorrhage associated with duodenal ulcer   . GERD (gastroesophageal reflux disease)   . Hepatic encephalopathy (HCC), possible history of 09/03/2010  . Hepatic steatosis 06/09/2010   Diagnosed by U/S 05/12/10. Likely secondary to chronic ETOH abuse. Will continue to follow.    . History of traumatic brain injury   . Hyperkalemia 12/08/2014  . Hypertension   . Left leg weakness 07/27/2015  . Orbital floor (blow-out) closed fracture (HCC) 05/25/2017   Left orbit, stable.  No surgical intervention required.  . Pneumonia   . Pure hypercholesterolemia 07/25/2018  . Stroke [facility](HCC)    a few spanning from 1997 to last year  . Tobacco  dependence     Past Surgical History:  Procedure Laterality Date  . COLONOSCOPY    . ESOPHAGOGASTRODUODENOSCOPY (EGD) WITH PROPOFOL Left 12/09/2014   Procedure: ESOPHAGOGASTRODUODENOSCOPY (EGD) WITH PROPOFOL;  Surgeon: Bernette Redbirdobert Buccini, MD;  [facility];  Service: Endoscopy;  Laterality: Left;  . EVISCERATION Right 11/06/2017   Procedure: EVISCERATION REPAIR RIGHT EYE;  Surgeon: Carmela RimaPatel, Narendra, MD;  [facility];  Service: Ophthalmology;  Laterality: Right;  . EYE SURGERY     eye lid was split    No family history on file.  Social History:  reports that he has been smoking cigarettes. He has a 15.00 pack-year smoking history. He has never used smokeless tobacco. He reports current alcohol use of about 14.0 standard drinks of alcohol per week. He reports that he does not use drugs.  Allergies:  Allergies  Allergen Reactions  . Ace Inhibitors Anaphylaxis and Swelling    Tongue and face became swollen  . Penicillins Rash    From childhood: Has patient had a PCN reaction causing immediate rash, facial/tongue/throat swelling, SOB or lightheadedness with hypotension: Yes Has patient had a PCN reaction causing severe rash involving mucus membranes or skin necrosis: Unk Has patient had a PCN reaction that required hospitalization: Unk Has patient had a PCN reaction occurring within the last 10 years: No If all of the above answers are "NO", then may proceed with Cephalosporin use.     Medications: I have reviewed the patient's current medications.  Results for orders placed or performed during the hospital encounter of 08/15/18 (from the  past 48 hour(s))  CBG monitoring, ED     Status: None   Collection Time: 08/15/18 12:01 PM  Result Value Ref Range   Glucose-Capillary 98 70 - 99 mg/dL   Comment 1 Notify RN    Comment 2 Document in Chart   Urinalysis, Routine w reflex microscopic     Status: Abnormal   Collection Time: 08/15/18 12:08 PM  Result Value Ref Range    Color, Urine YELLOW YELLOW   APPearance CLEAR CLEAR   Specific Gravity, Urine 1.011 1.005 - 1.030   pH 5.0 5.0 - 8.0   Glucose, UA NEGATIVE NEGATIVE mg/dL   Hgb urine dipstick NEGATIVE NEGATIVE   Bilirubin Urine NEGATIVE NEGATIVE   Ketones, ur 5 (A) NEGATIVE mg/dL   Protein, ur 30 (A) NEGATIVE mg/dL   Nitrite NEGATIVE NEGATIVE   Leukocytes,Ua NEGATIVE NEGATIVE   RBC / HPF 0-5 0 - 5 RBC/hpf   WBC, UA 0-5 0 - 5 WBC/hpf   Bacteria, UA NONE SEEN NONE SEEN   Squamous Epithelial / LPF 0-5 0 - 5    Comment: Performed at [facility], [address] [place], [place] [address]  CBC with Differential/Platelet     Status: Abnormal   Collection Time: 08/15/18 12:24 PM  Result Value Ref Range   WBC 13.2 (H) 4.0 - 10.5 K/uL   RBC 4.13 (L) 4.22 - 5.81 MIL/uL   Hemoglobin 13.8 13.0 - 17.0 g/dL   HCT 13.040.9 86.539.0 - 78.452.0 %   MCV 99.0 80.0 - 100.0 fL   MCH 33.4 26.0 - 34.0 pg   MCHC 33.7 30.0 - 36.0 g/dL   RDW 69.612.9 29.511.5 - 28.415.5 %   Platelets 247 150 - 400 K/uL   nRBC 0.0 0.0 - 0.2 %   Neutrophils Relative % 82 %   Neutro Abs 10.8 (H) 1.7 - 7.7 K/uL   Lymphocytes Relative 8 %   Lymphs Abs 1.1 0.7 - 4.0 K/uL   Monocytes Relative 9 %   Monocytes Absolute 1.2 (H) 0.1 - 1.0 K/uL   Eosinophils Relative 0 %   Eosinophils Absolute 0.0 0.0 - 0.5 K/uL   Basophils Relative 0 %   Basophils Absolute 0.0 0.0 - 0.1 K/uL   Immature Granulocytes 1 %   Abs Immature Granulocytes 0.16 (H) 0.00 - 0.07 K/uL    Comment: Performed at [facility], [address] [place], [place] [address]  Comprehensive metabolic panel     Status: Abnormal   Collection Time: 08/15/18 12:24 PM  Result Value Ref Range   Sodium 132 (L) 135 - 145 mmol/L   Potassium 4.3 3.5 - 5.1 mmol/L   Chloride 99 98 - 111 mmol/L   CO2 20 (L) 22 - 32 mmol/L   Glucose, Bld 106 (H) 70 - 99 mg/dL   BUN 12 6 - 20 mg/dL   Creatinine, Ser 0.101.17 0.61 - 1.24 mg/dL   Calcium 9.5 8.9 - 27.210.3 mg/dL   Total Protein 7.3 6.5 - 8.1 g/dL   Albumin  2.9 (L) 3.5 - 5.0 g/dL   AST 29 15 - 41 U/L   ALT 27 0 - 44 U/L   Alkaline Phosphatase 89 38 - 126 U/L   Total Bilirubin 1.0 0.3 - 1.2 mg/dL   GFR calc non Af Amer >60 >60 mL/min   GFR calc Af Amer >60 >60 mL/min   Anion gap 13 5 - 15    Comment: Performed at [facility], [address] [place], [place] [address]  Troponin I - ONCE - STAT     Status: None   Collection Time: 08/15/18 12:24 PM  Result Value Ref Range   Troponin I <0.03 <0.03 ng/mL    Comment: Performed at [facility], [address] [place], [place] [address]  Lactic acid, plasma     Status: None   Collection Time: 08/15/18 12:24 PM  Result Value Ref Range   Lactic Acid, Venous 1.3 0.5 - 1.9 mmol/L    Comment: Performed at [facility], [address] [place], [place] [address]  Protime-INR     Status: None   Collection Time: 08/15/18 12:24 PM  Result Value Ref Range   Prothrombin Time 12.6 11.4 - 15.2 seconds   INR 1.0 0.8 - 1.2    Comment: (NOTE) INR goal varies based on device and disease states. Performed at [facility], [address] [place], [place] [address]   I-stat chem 8, ED (not at [facility] or [facility])     Status: Abnormal   Collection Time: 08/15/18 12:33 PM  Result Value Ref Range   Sodium 131 (L) 135 - 145 mmol/L   Potassium 5.2 (H) 3.5 - 5.1 mmol/L   Chloride 100 98 - 111 mmol/L   BUN 16 6 - 20 mg/dL   Creatinine, Ser 6.571.10 0.61 - 1.24 mg/dL   Glucose, Bld 846108 (H) 70 - 99 mg/dL   Calcium, Ion 9.621.09 (L) 1.15 - 1.40 mmol/L   TCO2 25 22 - 32 mmol/L   Hemoglobin 15.0 13.0 - 17.0 g/dL   HCT 95.244.0 84.139.0 - 32.452.0 %  SARS Coronavirus 2 (CEPHEID - Performed in [facility] hospital lab), Hosp Order     Status: None   Collection Time: 08/15/18  5:15 PM   Specimen: Nasopharyngeal Swab  Result Value Ref Range   SARS Coronavirus 2 NEGATIVE NEGATIVE    Comment: (NOTE) If result is NEGATIVE SARS-CoV-2 target nucleic acids are NOT DETECTED. The SARS-CoV-2 RNA is generally detectable in  upper and lower  respiratory specimens during the acute phase of infection. The lowest  concentration of SARS-CoV-2 viral copies this assay can detect is 250  copies / mL. A negative result does not preclude SARS-CoV-2 infection  and should not be used as the sole basis for treatment or other  patient management decisions.  A negative result may occur with  improper specimen collection / handling, submission of specimen other  than nasopharyngeal swab, presence of viral mutation(s) within the  areas targeted by this assay, and inadequate number of viral copies  (<250 copies / mL). A negative result must be combined with clinical  observations, patient history, and epidemiological information. If result is POSITIVE SARS-CoV-2 target nucleic acids are DETECTED. The SARS-CoV-2 RNA is generally detectable in upper and lower  respiratory specimens dur ing the acute phase of infection.  Positive  results are indicative of active infection with SARS-CoV-2.  Clinical  correlation with patient history and other diagnostic information is  necessary to determine patient infection status.  Positive results do  not rule out bacterial infection or co-infection with other viruses. If result is PRESUMPTIVE POSTIVE SARS-CoV-2 nucleic acids MAY BE PRESENT.   A presumptive positive result was obtained on the submitted specimen  and confirmed on repeat testing.  While 2019 novel coronavirus  (SARS-CoV-2) nucleic acids may be present in the submitted sample  additional confirmatory testing may be necessary for epidemiological  and / or clinical management  purposes  to differentiate between  SARS-CoV-2 and other Sarbecovirus currently known to infect humans.  If clinically indicated additional testing with an alternate test  methodology [phone]) is advised. The SARS-CoV-2 RNA is generally  detectable in upper and lower respiratory sp ecimens during the acute  phase of infection. The expected result is  Negative. Fact Sheet for Patients:  BoilerBrush.com.cy Fact Sheet for Healthcare Providers: https://pope.com/ This test is not yet approved or cleared by the [place] FDA and has been authorized for detection and/or diagnosis of SARS-CoV-2 by FDA under an Emergency Use Authorization (EUA).  This EUA will remain in effect (meaning this test can be used) for the duration of the COVID-19 declaration under Section 564(b)(1) of the Act, 21 U.S.C. section 360bbb-3(b)(1), unless the authorization is terminated or revoked sooner. Performed at [facility], [address], [place], [place] [address]   Lactic acid, plasma     Status: None   Collection Time: 08/15/18  6:28 PM  Result Value Ref Range   Lactic Acid, Venous 0.7 0.5 - 1.9 mmol/L    Comment: Performed at [facility], [address], [place], [place] [address]  APTT     Status: None   Collection Time: 08/15/18  7:19 PM  Result Value Ref Range   aPTT 32 24 - 36 seconds    Comment: Performed at [facility], [address], [place], [place] [address]  Ethanol     Status: None   Collection Time: 08/15/18 11:53 PM  Result Value Ref Range   Alcohol, Ethyl (B) <10 <10 mg/dL    Comment: (NOTE) Lowest detectable limit for serum alcohol is 10 mg/dL. For medical purposes only. Performed at [facility], [address] [place], [place] [address]   Heparin level (unfractionated)     Status: Abnormal   Collection Time: 08/16/18 12:31 AM  Result Value Ref Range   Heparin Unfractionated 0.14 (L) 0.30 - 0.70 IU/mL    Comment: (NOTE) If heparin results are below expected values, and patient dosage has  been confirmed, suggest follow up testing of antithrombin III levels. Performed at [facility], [address], [place], [place] [address]     Ct Head Wo Contrast  Result Date: 08/15/2018 CLINICAL DATA:  Fall, found down EXAM: CT HEAD WITHOUT CONTRAST CT  MAXILLOFACIAL WITHOUT CONTRAST CT CERVICAL SPINE WITHOUT CONTRAST TECHNIQUE: Multidetector CT imaging of the head, cervical spine, and maxillofacial structures were performed using the standard protocol without intravenous contrast. Multiplanar CT image reconstructions of the cervical spine and maxillofacial structures were also generated. COMPARISON:  CT brain, 05/25/2017 FINDINGS: CT HEAD FINDINGS Brain: No evidence of acute infarction, hemorrhage, hydrocephalus, extra-axial collection or mass lesion/mass effect. Extensive periventricular and deep white matter hypodensity and global volume. Vascular: No hyperdense vessel or unexpected calcification. CT FACIAL BONES FINDINGS Skull: Normal. Negative for fracture or focal lesion. Facial bones: Multiple nondisplaced fractures of the nasal bones (series 5, image 37). No displaced fractures or dislocations. Sinuses/Orbits: No acute finding. Right ocular prosthesis. Chronic fracture deformity of the left orbital floor. Other: None. CT CERVICAL SPINE FINDINGS Alignment: Normal. Skull base and vertebrae: Minimally displaced fractures of the right transverse process and vertebral foramen of the C4 and C5 vertebral bodies (series 15, image 55, 64). There is a tiny, nondisplaced corner type fracture of the anterior inferior endplate of C6 (series 16, image 32). There is a nonacute, although new compared to prior examination, fracture of the  C6 spinous process (series 16, image 33). No primary bone lesion or focal pathologic process. Soft tissues and spinal canal: No prevertebral fluid or swelling. No visible canal hematoma. Disc levels:  Intact. Upper chest: Negative. Other: None. IMPRESSION: 1. No acute intracranial pathology. Extensive periventricular and deep white matter hypodensity and global volume, which likely reflects unusually small-vessel white matter disease. 2. Multiple nondisplaced fractures of the nasal bones (series 5, image 37). No other acute displaced  fracture of the facial bones. 3. Right ocular prosthesis. Chronic fracture deformity of the left orbital floor. 4. Minimally displaced fractures of the right transverse process and vertebral foramen of the C4 and C5 vertebral bodies (series 15, image 55, 64). There is a tiny, nondisplaced corner type fracture of the anterior inferior endplate of C6 (series 16, image 32), of uncertain acuity. There is a nonacute, although new compared to prior examination, fracture of the C6 spinous process (series 16, image 33). Although new compared to prior examination dated 05/25/2017, these are of uncertain acuity given appearance and history of multiple falls. Electronically Signed   By: Lauralyn Primes M.D.   On: 08/15/2018 15:38   Ct Chest W Contrast  Result Date: 08/15/2018 CLINICAL DATA:  Status post fall. Area of redness under the left arm noted clinically. EXAM: CT CHEST WITH CONTRAST TECHNIQUE: Multidetector CT imaging of the chest was performed during intravenous contrast administration. CONTRAST:  75mL OMNIPAQUE IOHEXOL 300 MG/ML  SOLN COMPARISON:  Chest radiograph 08/15/2018 FINDINGS: Cardiovascular: No significant vascular findings. Normal heart size. No pericardial effusion. Mediastinum/Nodes: No enlarged mediastinal, hilar, or axillary lymph nodes. Thyroid gland, trachea, and esophagus demonstrate no significant findings. Lungs/Pleura: Bibasilar atelectasis. The lungs otherwise clear. Upper Abdomen: No acute abnormality. Musculoskeletal: Mild chronic appearing, with sclerotic borders, compression deformity of the superior endplate of T4 vertebral body. Complex fluid collection within the right lateral chest wall spends approximately 11 cm anterior to posterior dimension. There is mild overlying skin thickening. IMPRESSION: 1. Bibasilar atelectasis. 2. Mild chronic appearing compression deformity of the superior endplate of T4 vertebral body may be posttraumatic or degenerative. 3. Large complex fluid collection  within the right lateral chest wall, with overlying skin thickening. This may represent subcutaneous hematoma or an abscess formation. Electronically Signed   By: Ted Mcalpine M.D.   On: 08/15/2018 15:40   Ct Cervical Spine Wo Contrast  Result Date: 08/15/2018 CLINICAL DATA:  Fall, found down EXAM: CT HEAD WITHOUT CONTRAST CT MAXILLOFACIAL WITHOUT CONTRAST CT CERVICAL SPINE WITHOUT CONTRAST TECHNIQUE: Multidetector CT imaging of the head, cervical spine, and maxillofacial structures were performed using the standard protocol without intravenous contrast. Multiplanar CT image reconstructions of the cervical spine and maxillofacial structures were also generated. COMPARISON:  CT brain, 05/25/2017 FINDINGS: CT HEAD FINDINGS Brain: No evidence of acute infarction, hemorrhage, hydrocephalus, extra-axial collection or mass lesion/mass effect. Extensive periventricular and deep white matter hypodensity and global volume. Vascular: No hyperdense vessel or unexpected calcification. CT FACIAL BONES FINDINGS Skull: Normal. Negative for fracture or focal lesion. Facial bones: Multiple nondisplaced fractures of the nasal bones (series 5, image 37). No displaced fractures or dislocations. Sinuses/Orbits: No acute finding. Right ocular prosthesis. Chronic fracture deformity of the left orbital floor. Other: None. CT CERVICAL SPINE FINDINGS Alignment: Normal. Skull base and vertebrae: Minimally displaced fractures of the right transverse process and vertebral foramen of the C4 and C5 vertebral bodies (series 15, image 55, 64). There is a tiny, nondisplaced corner type fracture of the anterior inferior endplate of C6 (series 16,  image 32). There is a nonacute, although new compared to prior examination, fracture of the C6 spinous process (series 16, image 33). No primary bone lesion or focal pathologic process. Soft tissues and spinal canal: No prevertebral fluid or swelling. No visible canal hematoma. Disc levels:   Intact. Upper chest: Negative. Other: None. IMPRESSION: 1. No acute intracranial pathology. Extensive periventricular and deep white matter hypodensity and global volume, which likely reflects unusually small-vessel white matter disease. 2. Multiple nondisplaced fractures of the nasal bones (series 5, image 37). No other acute displaced fracture of the facial bones. 3. Right ocular prosthesis. Chronic fracture deformity of the left orbital floor. 4. Minimally displaced fractures of the right transverse process and vertebral foramen of the C4 and C5 vertebral bodies (series 15, image 55, 64). There is a tiny, nondisplaced corner type fracture of the anterior inferior endplate of C6 (series 16, image 32), of uncertain acuity. There is a nonacute, although new compared to prior examination, fracture of the C6 spinous process (series 16, image 33). Although new compared to prior examination dated 05/25/2017, these are of uncertain acuity given appearance and history of multiple falls. Electronically Signed   By: Eddie Candle M.D.   On: 08/15/2018 15:38   Dg Chest Port 1 View  Result Date: 08/15/2018 CLINICAL DATA:  Found down. EXAM: PORTABLE CHEST 1 VIEW COMPARISON:  07/26/2015 FINDINGS: The cardiac silhouette, mediastinal and hilar contours are within normal limits given the AP projection and portable technique. Low lung volumes with vascular crowding and streaky basilar atelectasis. No infiltrates, edema or effusions. The bony thorax is grossly intact. Remote healed left rib fractures are noted. IMPRESSION: Low lung volumes with vascular crowding and bibasilar atelectasis. Electronically Signed   By: Marijo Sanes M.D.   On: 08/15/2018 14:07   Ct Maxillofacial Wo Contrast  Result Date: 08/15/2018 CLINICAL DATA:  Fall, found down EXAM: CT HEAD WITHOUT CONTRAST CT MAXILLOFACIAL WITHOUT CONTRAST CT CERVICAL SPINE WITHOUT CONTRAST TECHNIQUE: Multidetector CT imaging of the head, cervical spine, and maxillofacial  structures were performed using the standard protocol without intravenous contrast. Multiplanar CT image reconstructions of the cervical spine and maxillofacial structures were also generated. COMPARISON:  CT brain, 05/25/2017 FINDINGS: CT HEAD FINDINGS Brain: No evidence of acute infarction, hemorrhage, hydrocephalus, extra-axial collection or mass lesion/mass effect. Extensive periventricular and deep white matter hypodensity and global volume. Vascular: No hyperdense vessel or unexpected calcification. CT FACIAL BONES FINDINGS Skull: Normal. Negative for fracture or focal lesion. Facial bones: Multiple nondisplaced fractures of the nasal bones (series 5, image 37). No displaced fractures or dislocations. Sinuses/Orbits: No acute finding. Right ocular prosthesis. Chronic fracture deformity of the left orbital floor. Other: None. CT CERVICAL SPINE FINDINGS Alignment: Normal. Skull base and vertebrae: Minimally displaced fractures of the right transverse process and vertebral foramen of the C4 and C5 vertebral bodies (series 15, image 55, 64). There is a tiny, nondisplaced corner type fracture of the anterior inferior endplate of C6 (series 16, image 32). There is a nonacute, although new compared to prior examination, fracture of the C6 spinous process (series 16, image 33). No primary bone lesion or focal pathologic process. Soft tissues and spinal canal: No prevertebral fluid or swelling. No visible canal hematoma. Disc levels:  Intact. Upper chest: Negative. Other: None. IMPRESSION: 1. No acute intracranial pathology. Extensive periventricular and deep white matter hypodensity and global volume, which likely reflects unusually small-vessel white matter disease. 2. Multiple nondisplaced fractures of the nasal bones (series 5, image 37). No other  acute displaced fracture of the facial bones. 3. Right ocular prosthesis. Chronic fracture deformity of the left orbital floor. 4. Minimally displaced fractures of the  right transverse process and vertebral foramen of the C4 and C5 vertebral bodies (series 15, image 55, 64). There is a tiny, nondisplaced corner type fracture of the anterior inferior endplate of C6 (series 16, image 32), of uncertain acuity. There is a nonacute, although new compared to prior examination, fracture of the C6 spinous process (series 16, image 33). Although new compared to prior examination dated 05/25/2017, these are of uncertain acuity given appearance and history of multiple falls. Electronically Signed   By: Lauralyn Primes M.D.   On: 08/15/2018 15:38    Pertinent items noted in HPI and remainder of comprehensive ROS otherwise negative. Blood pressure (!) 154/99, pulse 78, temperature (!) 97.2 F (36.2 C), temperature source Axillary, resp. rate 20, height  (1.727 m), weight 95.2 kg, SpO2 100 %. Patient is awake and alert.  He is oriented to person but not place or situation or time.  He has fluent speech.  Cranial nerve function appears intact bilaterally.  Patient moving all 4 extremities strongly to command.  No evidence of significant weakness or sensory loss on exam.  Neck completely nontender.  Patient with full active range of motion.  Assessment/Plan: I believe the patient's fractures of C5 and C6 are old.  There are changes of bony sclerosis around the fracture margins consistent with incomplete healing.  I do not believe that he has any evidence of instability.  I think he may be mobilized without restriction.  He does not need to follow-up with me.  Sherilyn Cooter A Alon Mazor 08/16/2018, 10:12 AM

## 2018-08-17 ENCOUNTER — Inpatient Hospital Stay (HOSPITAL_COMMUNITY): Payer: Medicare Other

## 2018-08-17 LAB — BASIC METABOLIC PANEL
Anion gap: 12 (ref 5–15)
BUN: 14 mg/dL (ref 6–20)
CO2: 19 mmol/L — ABNORMAL LOW (ref 22–32)
Calcium: 9.4 mg/dL (ref 8.9–10.3)
Chloride: 103 mmol/L (ref 98–111)
Creatinine, Ser: 1.09 mg/dL (ref 0.61–1.24)
GFR calc Af Amer: >60 mL/min (ref >60)
GFR calc non Af Amer: >60 mL/min (ref >60)
Glucose, Bld: 83 mg/dL (ref 70–99)
Potassium: 3.7 mmol/L (ref 3.5–5.1)
Sodium: 134 mmol/L — ABNORMAL LOW (ref 135–145)

## 2018-08-17 LAB — CBC
HCT: 40.5 % (ref 39.0–52.0)
Hemoglobin: 13.5 g/dL (ref 13.0–17.0)
MCH: 32.7 pg (ref 26.0–34.0)
MCHC: 33.3 g/dL (ref 30.0–36.0)
MCV: 98.1 fL (ref 80.0–100.0)
Platelets: 216 10*3/uL (ref 150–400)
RBC: 4.13 MIL/uL — ABNORMAL LOW (ref 4.22–5.81)
RDW: 12.9 % (ref 11.5–15.5)
WBC: 8.4 10*3/uL (ref 4.0–10.5)
nRBC: 0 % (ref 0.0–0.2)

## 2018-08-17 MED ORDER — SODIUM CHLORIDE 0.9 % IV SOLN
INTRAVENOUS | Status: DC | PRN
  Administered 2018-08-17 – 2018-08-19 (×2): 250 mL via INTRAVENOUS

## 2018-08-17 NOTE — Evaluation (Signed)
Occupational Therapy Evaluation Patient Details Name: [name] [name] MRN: [id number] DOB: 30-Nov-1960 Today's Date: 08/17/2018    History of Present Illness 58 y.o. male admitted on 08/15/18 after wife found him on the floor with AMS.  Dx with AMS/fall likely secondary to R lateral chest abcess, plan for surgery after he is off of plavix for 5 days (08/21/18 possible), hyponatremia, ETOH abuse/withdrawl, recent stroke (07/17/18), vascular dementia (with h/o TBI in 1997), cervical transverse process fx, neurosurgery consulted and initially recommended C-collar, but pt cannot tolerate (ok without per NS), nasal bone fx (follow up with ENT as OP-no nose blowing precaution).  Other significant PMH of HTN, orbital floor blow out fx 04/2017,    Clinical Impression   Pt PTA: living with spouse, multiple falls and unable to properly care for self. Pt currently impulsive and asked to wait for OTR to begin walking, but wanted to get into the bathroom quickly. Pt performing ADL functional mobility with minA and RW. Pt stood at sink x2 mins for light grooming tasks. HR increased to 140 and pt remained asymptomatic, RN came in room to assess. Pt returned to recliner with plopping method and reminded of proper technique. Pt set-upA to minA for ADL. Pt limited by poor activity tolerance, decreased safety awareness and fatigues easily with minimal exertion. Pt would benefit from continued OT skilled services for ADL, mobility and safety in SNF setting. OT following acutely.    Follow Up Recommendations  SNF;Supervision/Assistance - 24 hour    Equipment Recommendations  None recommended by OT    Recommendations for Other Services       Precautions / Restrictions Precautions Precautions: Fall Precaution Comments: pt reports he falls "a lot" Restrictions Weight Bearing Restrictions: No      Mobility Bed Mobility Overal bed mobility: Needs Assistance Bed Mobility: Supine to Sit;Sit to Supine     Supine to  sit: Supervision Sit to supine: Supervision   General bed mobility comments: supervision for safety  Transfers Overall transfer level: Needs assistance Equipment used: Rolling walker (2 wheeled) Transfers: Sit to/from Stand Sit to Stand: Min assist;Mod assist         General transfer comment: Min assist to power up from low sitting surface to RW. Requires cues to avoid plopping into chair    Balance Overall balance assessment: Needs assistance Sitting-balance support: Feet supported;No upper extremity supported Sitting balance-Leahy Scale: Fair Sitting balance - Comments: close supervision EOB.    Standing balance support: Bilateral upper extremity supported Standing balance-Leahy Scale: Poor Standing balance comment: needs external assist in standing.                            ADL either performed or assessed with clinical judgement   ADL Overall ADL's : Needs assistance/impaired Eating/Feeding: Set up;Sitting   Grooming: Min guard;Sitting   Upper Body Bathing: Minimal assistance;Sitting   Lower Body Bathing: Minimal assistance;Sitting/lateral leans;Sit to/from stand;Cueing for safety;Cueing for sequencing   Upper Body Dressing : Set up;Sitting;Standing   Lower Body Dressing: Minimal assistance;Sitting/lateral leans;Sit to/from stand;Cueing for sequencing   Toilet Transfer: Minimal assistance;Ambulation;Regular Toilet;Grab bars   Toileting- Clothing Manipulation and Hygiene: Minimal assistance;Sitting/lateral lean;Sit to/from stand;Cueing for safety;Cueing for sequencing       Functional mobility during ADLs: Minimal assistance;Rolling walker General ADL Comments: set-upA to minA for ADL. pt donning socks at EOB, but fatigued after minimal exertion.     Vision Baseline Vision/History: Legally blind(in R eye) Patient Visual  Report: No change from baseline Vision Assessment?: No apparent visual deficits Additional Comments: R eye- legally blind      Perception     Praxis      Pertinent Vitals/Pain Pain Assessment: No/denies pain     Hand Dominance Right   Extremity/Trunk Assessment Upper Extremity Assessment Upper Extremity Assessment: Overall WFL for tasks assessed   Lower Extremity Assessment Lower Extremity Assessment: Defer to PT evaluation RLE Deficits / Details: Legs are strong, but uncoordinated bil .  LLE Deficits / Details: legs are strong, but uncoordinated bil.    Cervical / Trunk Assessment Cervical / Trunk Assessment: Normal   Communication Communication Communication: Other (comment)(slurred speech with facial fractures)   Cognition Arousal/Alertness: Awake/alert Behavior During Therapy: Impulsive;Flat affect;Agitated(easily irritated) Overall Cognitive Status: History of cognitive impairments - at baseline                                 General Comments: No family to report baseline, impulsive, easily irritated by PT slowing the RW down, decreased safety awareness (dove to sit on bed and nearly missed).    General Comments  HR 140 remains asymptomatic. RN came in room to assess    Exercises     Shoulder Instructions      Home Living Family/patient expects to be discharged to:: Private residence Living Arrangements: Spouse/significant other Available Help at Discharge: Family(per pt, wife works days) Type of Home: House Home Access: Level entry     Home Layout: One level     Bathroom Shower/Tub: Producer, television/film/videoWalk-in shower   Bathroom Toilet: Standard     Home Equipment: None          Prior Functioning/Environment Level of Independence: Independent                 OT Problem List: Decreased strength;Decreased activity tolerance;Impaired balance (sitting and/or standing);Decreased coordination;Decreased safety awareness;Pain      OT Treatment/Interventions: Self-care/ADL training;Therapeutic exercise;Neuromuscular education;Energy conservation;Manual therapy;Therapeutic  activities;Patient/family education;Balance training    OT Goals(Current goals can be found in the care plan section) Acute Rehab OT Goals Patient Stated Goal: get to the bathroom OT Goal Formulation: With patient Time For Goal Achievement: 08/31/18 Potential to Achieve Goals: Good  OT Frequency: Min 2X/week   Barriers to D/C:            Co-evaluation              AM-PAC OT "6 Clicks" Daily Activity     Outcome Measure Help from another person eating meals?: None Help from another person taking care of personal grooming?: A Little Help from another person toileting, which includes using toliet, bedpan, or urinal?: A Lot Help from another person bathing (including washing, rinsing, drying)?: A Lot Help from another person to put on and taking off regular upper body clothing?: A Little Help from another person to put on and taking off regular lower body clothing?: A Little 6 Click Score: 17   End of Session Equipment Utilized During Treatment: Gait belt;Rolling walker Nurse Communication: Mobility status  Activity Tolerance: Patient tolerated treatment well Patient left: in chair;with call bell/phone within reach;with chair alarm set  OT Visit Diagnosis: Unsteadiness on feet (R26.81);Muscle weakness (generalized) (M62.81)                Time: [id number] OT Time Calculation (min): 25 min Charges:  OT General Charges $OT Visit: 1 Visit OT Evaluation $OT Eval Moderate Complexity: 1  Mod OT Treatments $Self Care/Home Management : 8-22 mins  Cristi LoronAllison (Jelenek) Glendell [place] OTR/L Acute Rehabilitation Services Pager: [phone] Office: [phone]   [name] [name] 08/17/2018, 3:14 PM

## 2018-08-17 NOTE — Progress Notes (Signed)
Family Medicine Teaching Service Daily Progress Note Intern Pager: [phone]  Patient name: [name] Forestnthony L Hamblen Medical record number: [id number] Date of birth: 09/04/1960 Age: 58 y.o. Gender: male  Primary Care Provider: Marthenia RollingBland, Scott, DO Consultants: Neurosurgery, ENT, General Surgery Code Status: Full  Pt Overview and Major Events to Date:  08/15/2018 - Admission for altered mental status likely secondary to sepsis  Assessment and Plan: [name] [name] is a 58 y.o. male presenting with altered mental status. PMH is significant for Recent CVA (07/17/2018), vascular dementia, alcoholism, bipolar 1, encephalomalacia, HFrEF, HTN, depression.  AMS & Fall likely 2/2 right, lateral chest abscess  Need to wait 5 days  w/o plaxix (6/19-6/24) use before he can go to surgery for I&D. Plan to keep broad spectrum abx until he has complete resolution of AMS.  Due to his extensive vascular disease, new CVA is a strong possibility although he does not demonstrate any focal neurological deficits on exam.  Exam this morning, he was oriented to person and hospital but did not know the year or the president.  He appears to be generally agreeable and will often respond to yes regardless of the question.  That may be helpful to have wife come assess at bedside in order to have better understanding how far from baseline he is. -vancomycin (6/19-) -cefepime (6/19-) -Plan for I/D with general surgery after 5 days off of Plavix (6/25) -PT/OT/SLP -Continue robotic safety sitter, mits  Mild Hyponatremia-improving Sodium 134 this morning - fluid restrict when able to tolerate PO  Alcohol Abuse  Alcohol Withdrawal Unclear when he had his last drink over his daily alcohol uses.  We will continue to monitor CIWA for now. - CIWA  Hypertension Home medication includes amlodipine 10 mg daily, hydralazine 10 mg 3 times daily, metoprolol tartrate 100 mg twice daily. -Holding home medication until he passes a bedside  swallow  Recent stroke 07/17/2018  Vascular Dementia MRI in 07/17/2018 new acute stroke.  He had been placed on Plavix.  Now holding Plavix in preparation for I&D. -Hold Plavix -Heparin drip discontinued on 6/20 -Low threshold for MR head  Cervical fractures Assessed by neurosurgery.  Per neurosurgery these are likely chronic and able to move w/o likely injury. This is good as patient is not tolerating aspen collar at this time.  - NS consulted, appreciate recommendations - Monitor for neurologic deficit - May keep collar off for now  Nasal bone fracture Discussed with ENT.  No need for ENT to come into the hospital urgently.  Plan for follow-up in clinic in at least 1 week to permit time for swelling to decrease.  In the meantime can take measures to reduce the risk of damage secondary to nose blowing. -Nasal saline spray/rinse 4 times daily -Avoid nose blowing -Ice for 24 hours  CAD/CVA/HLD Atorvastatin 80 mg taken at home. -Restart once taking p.o. -holding plavix as above  Heart failure with reduced ejection fraction Euvolemic on exam.  Last echo in 07/31/2018 showed an EF of 45 to 50%.  Home medication includes Isordil. -Restart Isordil when he passes swallow study and blood pressures are appropriate for restarting blood pressure lowering medication  GERD Protonix taken at home. -Restart Protonix once taking p.o.  Nicotine use Uses NicoDerm 21 mg patch daily at home. -Holding patch due to AMS  MDD Fluoxetine 40 mg taken at home. -Holding due to AMS  FEN/GI: N.p.o. until he passes bedside swallow Prophylaxis: Heparin drip per pharmacy  Disposition: pending improvement in mental status  Subjective:  No acute events overnight.  Staff noticed this morning that he had an elevated heart rate to 140 while working with physical therapy.  He had no chest pressure/S OB/palpitations at that time.  Start returned to within normal limits following rest  This morning,  he did not endorse any pain.  He is very agreeable during our interview and generally responds yes to any question.  Objective: Temp:  [96.8 F (36 C)-98.6 F (37 C)] 97.8 F (36.6 C) (06/21 0842) Pulse Rate:  [71-100] 100 (06/21 0842) Resp:  [14-17] 17 (06/21 0842) BP: (114-153)/(92-110) 114/92 (06/21 0842) SpO2:  [98 %-100 %] 99 % (06/21 0842) Physical Exam: General: Alert and cooperative and appears to be in no acute distress.  Generally responds with yes to almost any question.  Is not clear if he has any source of discomfort at this time. HEENT: Abrasions noted around right eye, well-healing. Cardio: Normal S1 and S2, no S3 or S4. Rhythm is regular. No murmurs or rubs.   Pulm: Clear to auscultation bilaterally, no crackles, wheezing, or diminished breath sounds. Normal respiratory effort Abdomen: Bowel sounds normal. Abdomen soft and non-tender.  Extremities: No peripheral edema. Warm/ well perfused.  Strong radial and pedal pulses. Neuro: Cranial nerves grossly intact.  No focal deficits noted on exam today.   Laboratory: Recent Labs  Lab 08/15/18 1224 08/15/18 1233 08/16/18 1001 08/17/18 0546  WBC 13.2*  --  13.2* 8.4  HGB 13.8 15.0 15.4 13.5  HCT 40.9 44.0 47.1 40.5  PLT 247  --  203 216   Recent Labs  Lab 08/15/18 1224 08/15/18 1233 08/16/18 1001 08/17/18 0547  NA 132* 131* 133* 134*  K 4.3 5.2* 4.5 3.7  CL 99 100 102 103  CO2 20*  --  13* 19*  BUN 12 16 13 14   CREATININE 1.17 1.10 1.02 1.09  CALCIUM 9.5  --  9.5 9.4  PROT 7.3  --   --   --   BILITOT 1.0  --   --   --   ALKPHOS 89  --   --   --   ALT 27  --   --   --   AST 29  --   --   --   GLUCOSE 106* 108* 99 83      Imaging/Diagnostic Tests: Ct Head Wo Contrast  Result Date: 08/15/2018 CLINICAL DATA:  Fall, found down EXAM: CT HEAD WITHOUT CONTRAST CT MAXILLOFACIAL WITHOUT CONTRAST CT CERVICAL SPINE WITHOUT CONTRAST TECHNIQUE: Multidetector CT imaging of the head, cervical spine, and  maxillofacial structures were performed using the standard protocol without intravenous contrast. Multiplanar CT image reconstructions of the cervical spine and maxillofacial structures were also generated. COMPARISON:  CT brain, 05/25/2017 FINDINGS: CT HEAD FINDINGS Brain: No evidence of acute infarction, hemorrhage, hydrocephalus, extra-axial collection or mass lesion/mass effect. Extensive periventricular and deep white matter hypodensity and global volume. Vascular: No hyperdense vessel or unexpected calcification. CT FACIAL BONES FINDINGS Skull: Normal. Negative for fracture or focal lesion. Facial bones: Multiple nondisplaced fractures of the nasal bones (series 5, image 37). No displaced fractures or dislocations. Sinuses/Orbits: No acute finding. Right ocular prosthesis. Chronic fracture deformity of the left orbital floor. Other: None. CT CERVICAL SPINE FINDINGS Alignment: Normal. Skull base and vertebrae: Minimally displaced fractures of the right transverse process and vertebral foramen of the C4 and C5 vertebral bodies (series 15, image 55, 64). There is a tiny, nondisplaced corner type fracture of the anterior inferior endplate of C6 (  series 16, image 32). There is a nonacute, although new compared to prior examination, fracture of the C6 spinous process (series 16, image 33). No primary bone lesion or focal pathologic process. Soft tissues and spinal canal: No prevertebral fluid or swelling. No visible canal hematoma. Disc levels:  Intact. Upper chest: Negative. Other: None. IMPRESSION: 1. No acute intracranial pathology. Extensive periventricular and deep white matter hypodensity and global volume, which likely reflects unusually small-vessel white matter disease. 2. Multiple nondisplaced fractures of the nasal bones (series 5, image 37). No other acute displaced fracture of the facial bones. 3. Right ocular prosthesis. Chronic fracture deformity of the left orbital floor. 4. Minimally displaced  fractures of the right transverse process and vertebral foramen of the C4 and C5 vertebral bodies (series 15, image 55, 64). There is a tiny, nondisplaced corner type fracture of the anterior inferior endplate of C6 (series 16, image 32), of uncertain acuity. There is a nonacute, although new compared to prior examination, fracture of the C6 spinous process (series 16, image 33). Although new compared to prior examination dated 05/25/2017, these are of uncertain acuity given appearance and history of multiple falls. Electronically Signed   By: Lauralyn PrimesAlex  Bibbey M.D.   On: 08/15/2018 15:38   Ct Chest W Contrast  Result Date: 08/15/2018 CLINICAL DATA:  Status post fall. Area of redness under the left arm noted clinically. EXAM: CT CHEST WITH CONTRAST TECHNIQUE: Multidetector CT imaging of the chest was performed during intravenous contrast administration. CONTRAST:  75mL OMNIPAQUE IOHEXOL 300 MG/ML  SOLN COMPARISON:  Chest radiograph 08/15/2018 FINDINGS: Cardiovascular: No significant vascular findings. Normal heart size. No pericardial effusion. Mediastinum/Nodes: No enlarged mediastinal, hilar, or axillary lymph nodes. Thyroid gland, trachea, and esophagus demonstrate no significant findings. Lungs/Pleura: Bibasilar atelectasis. The lungs otherwise clear. Upper Abdomen: No acute abnormality. Musculoskeletal: Mild chronic appearing, with sclerotic borders, compression deformity of the superior endplate of T4 vertebral body. Complex fluid collection within the right lateral chest wall spends approximately 11 cm anterior to posterior dimension. There is mild overlying skin thickening. IMPRESSION: 1. Bibasilar atelectasis. 2. Mild chronic appearing compression deformity of the superior endplate of T4 vertebral body may be posttraumatic or degenerative. 3. Large complex fluid collection within the right lateral chest wall, with overlying skin thickening. This may represent subcutaneous hematoma or an abscess formation.  Electronically Signed   By: Ted Mcalpineobrinka  Dimitrova M.D.   On: 08/15/2018 15:40   Ct Cervical Spine Wo Contrast  Result Date: 08/15/2018 CLINICAL DATA:  Fall, found down EXAM: CT HEAD WITHOUT CONTRAST CT MAXILLOFACIAL WITHOUT CONTRAST CT CERVICAL SPINE WITHOUT CONTRAST TECHNIQUE: Multidetector CT imaging of the head, cervical spine, and maxillofacial structures were performed using the standard protocol without intravenous contrast. Multiplanar CT image reconstructions of the cervical spine and maxillofacial structures were also generated. COMPARISON:  CT brain, 05/25/2017 FINDINGS: CT HEAD FINDINGS Brain: No evidence of acute infarction, hemorrhage, hydrocephalus, extra-axial collection or mass lesion/mass effect. Extensive periventricular and deep white matter hypodensity and global volume. Vascular: No hyperdense vessel or unexpected calcification. CT FACIAL BONES FINDINGS Skull: Normal. Negative for fracture or focal lesion. Facial bones: Multiple nondisplaced fractures of the nasal bones (series 5, image 37). No displaced fractures or dislocations. Sinuses/Orbits: No acute finding. Right ocular prosthesis. Chronic fracture deformity of the left orbital floor. Other: None. CT CERVICAL SPINE FINDINGS Alignment: Normal. Skull base and vertebrae: Minimally displaced fractures of the right transverse process and vertebral foramen of the C4 and C5 vertebral bodies (series 15, image 55, 64).  There is a tiny, nondisplaced corner type fracture of the anterior inferior endplate of C6 (series 16, image 32). There is a nonacute, although new compared to prior examination, fracture of the C6 spinous process (series 16, image 33). No primary bone lesion or focal pathologic process. Soft tissues and spinal canal: No prevertebral fluid or swelling. No visible canal hematoma. Disc levels:  Intact. Upper chest: Negative. Other: None. IMPRESSION: 1. No acute intracranial pathology. Extensive periventricular and deep white matter  hypodensity and global volume, which likely reflects unusually small-vessel white matter disease. 2. Multiple nondisplaced fractures of the nasal bones (series 5, image 37). No other acute displaced fracture of the facial bones. 3. Right ocular prosthesis. Chronic fracture deformity of the left orbital floor. 4. Minimally displaced fractures of the right transverse process and vertebral foramen of the C4 and C5 vertebral bodies (series 15, image 55, 64). There is a tiny, nondisplaced corner type fracture of the anterior inferior endplate of C6 (series 16, image 32), of uncertain acuity. There is a nonacute, although new compared to prior examination, fracture of the C6 spinous process (series 16, image 33). Although new compared to prior examination dated 05/25/2017, these are of uncertain acuity given appearance and history of multiple falls. Electronically Signed   By: Eddie Candle M.D.   On: 08/15/2018 15:38   Dg Chest Port 1 View  Result Date: 08/15/2018 CLINICAL DATA:  Found down. EXAM: PORTABLE CHEST 1 VIEW COMPARISON:  07/26/2015 FINDINGS: The cardiac silhouette, mediastinal and hilar contours are within normal limits given the AP projection and portable technique. Low lung volumes with vascular crowding and streaky basilar atelectasis. No infiltrates, edema or effusions. The bony thorax is grossly intact. Remote healed left rib fractures are noted. IMPRESSION: Low lung volumes with vascular crowding and bibasilar atelectasis. Electronically Signed   By: Marijo Sanes M.D.   On: 08/15/2018 14:07   Ct Maxillofacial Wo Contrast  Result Date: 08/15/2018 CLINICAL DATA:  Fall, found down EXAM: CT HEAD WITHOUT CONTRAST CT MAXILLOFACIAL WITHOUT CONTRAST CT CERVICAL SPINE WITHOUT CONTRAST TECHNIQUE: Multidetector CT imaging of the head, cervical spine, and maxillofacial structures were performed using the standard protocol without intravenous contrast. Multiplanar CT image reconstructions of the cervical  spine and maxillofacial structures were also generated. COMPARISON:  CT brain, 05/25/2017 FINDINGS: CT HEAD FINDINGS Brain: No evidence of acute infarction, hemorrhage, hydrocephalus, extra-axial collection or mass lesion/mass effect. Extensive periventricular and deep white matter hypodensity and global volume. Vascular: No hyperdense vessel or unexpected calcification. CT FACIAL BONES FINDINGS Skull: Normal. Negative for fracture or focal lesion. Facial bones: Multiple nondisplaced fractures of the nasal bones (series 5, image 37). No displaced fractures or dislocations. Sinuses/Orbits: No acute finding. Right ocular prosthesis. Chronic fracture deformity of the left orbital floor. Other: None. CT CERVICAL SPINE FINDINGS Alignment: Normal. Skull base and vertebrae: Minimally displaced fractures of the right transverse process and vertebral foramen of the C4 and C5 vertebral bodies (series 15, image 55, 64). There is a tiny, nondisplaced corner type fracture of the anterior inferior endplate of C6 (series 16, image 32). There is a nonacute, although new compared to prior examination, fracture of the C6 spinous process (series 16, image 33). No primary bone lesion or focal pathologic process. Soft tissues and spinal canal: No prevertebral fluid or swelling. No visible canal hematoma. Disc levels:  Intact. Upper chest: Negative. Other: None. IMPRESSION: 1. No acute intracranial pathology. Extensive periventricular and deep white matter hypodensity and global volume, which likely reflects unusually small-vessel  white matter disease. 2. Multiple nondisplaced fractures of the nasal bones (series 5, image 37). No other acute displaced fracture of the facial bones. 3. Right ocular prosthesis. Chronic fracture deformity of the left orbital floor. 4. Minimally displaced fractures of the right transverse process and vertebral foramen of the C4 and C5 vertebral bodies (series 15, image 55, 64). There is a tiny, nondisplaced  corner type fracture of the anterior inferior endplate of C6 (series 16, image 32), of uncertain acuity. There is a nonacute, although new compared to prior examination, fracture of the C6 spinous process (series 16, image 33). Although new compared to prior examination dated 05/25/2017, these are of uncertain acuity given appearance and history of multiple falls. Electronically Signed   By: Lauralyn Primes M.D.   On: 08/15/2018 15:38    Mirian Mo, MD 08/17/2018, 10:27 AM PGY-2, [facility] FPTS Intern pager: [phone], text pages welcome

## 2018-08-17 NOTE — Progress Notes (Signed)
Modified Barium Swallow Progress Note  Patient Details  Name: [name] [name] MRN: [id number] Date of Birth: 25-Dec-1960  Today's Date: 08/17/2018  Modified Barium Swallow completed.  Full report located under Chart Review in the Imaging Section.  Brief recommendations include the following:  Clinical Impression  Patient has mild oropharyngeal dysphagia with no penetration or aspiration observed during MBS. Oral phase dysphagia evidenced with peacemeal swallowing and delayed oral transit. Delayed swallow initiation to the pyriform sinuses with thin liquids and to valleculae with puree and solids. However he had good anterior laryngeal movement and complete laryngeal closure. Multiple swallows with each bolus across all consistencies notice and thought to be a learned compensatory strategy to protect airway. Recommend continueing regular diet with thin liquids. Medications whole with liquids.  ST to follow up with diet tolerance at least one time.    Swallow Evaluation Recommendations       SLP Diet Recommendations: Regular solids;Thin liquid   Liquid Administration via: Cup;Straw   Medication Administration: Whole meds with liquid   Supervision: Patient able to self feed   Compensations: Minimize environmental distractions;Slow rate;Small sips/bites   Postural Changes: Remain semi-upright after after feeds/meals (Comment);Seated upright at 90 degrees   Oral Care Recommendations: Oral care BID        [place], MA, CCC-SLP 08/17/2018 12:25 PM

## 2018-08-17 NOTE — Progress Notes (Signed)
[name] sustaining is 140s while working with Occupational Therapy. Pt states no chest pain. HR now 96, resting comfortably in chair. Dr. Pilar Plate with Family Medicine Teaching Service notified. Nurse will continue to monitor. [name] [name]

## 2018-08-17 NOTE — Evaluation (Signed)
Physical Therapy Evaluation Patient Details Name: [name] [name] MRN: [id number] DOB: Jan 23, 1961 Today's Date: 08/17/2018   History of Present Illness  58 y.o. male admitted on 08/15/18 after wife found him on the floor with AMS.  Dx with AMS/fall likely secondary to R lateral chest abcess, plan for surgery after he is off of plavix for 5 days (08/21/18 possible), hyponatremia, ETOH abuse/withdrawl, recent stroke (07/17/18), vascular dementia (with h/o TBI in 1997), cervical transverse process fx, neurosurgery consulted and initially recommended C-collar, but pt cannot tolerate (ok without per NS), nasal bone fx (follow up with ENT as OP-no nose blowing precaution).  Other significant PMH of HTN, orbital floor blow out fx 04/2017,   Clinical Impression  Pt is impulsive, uncoordinated, and at high risk of falls.  I am unsure how far he is from baseline, though, given his h/o TBI and stroke.  He is appropriate for SNF placement if he and his wife are agreeable.  If they are not, max HH services.   PT to follow acutely for deficits listed below.      Follow Up Recommendations Home health PT;Supervision/Assistance - 24 hour    Equipment Recommendations  Rolling walker with 5" wheels    Recommendations for Other Services   NA     Precautions / Restrictions Precautions Precautions: Fall Precaution Comments: pt reports he falls "a lot" Restrictions Weight Bearing Restrictions: No      Mobility  Bed Mobility Overal bed mobility: Needs Assistance Bed Mobility: Supine to Sit;Sit to Supine     Supine to sit: Supervision Sit to supine: Supervision   General bed mobility comments: supervision for safety  Transfers Overall transfer level: Needs assistance Equipment used: Rolling walker (2 wheeled) Transfers: Sit to/from Stand Sit to Stand: Min assist;Mod assist         General transfer comment: Min assist to power up from low sitting surface to RW.  Mod assist to control dive descent to  sit back on bed after walking.    Ambulation/Gait Ambulation/Gait assistance: Min assist Gait Distance (Feet): 20 Feet Assistive device: Rolling walker (2 wheeled) Gait Pattern/deviations: Step-through pattern;Ataxic;Trunk flexed     General Gait Details: Pt with ataxic gait pattern, needed min assist for balance and cues and manual facilitation to stay inside of RW during gait and turns.  Pt was irritated by therapist slowing walker down, stopping and cussing before therapist explained why I was slowing the waker down (so he could get closer to it/inside of it).          Balance Overall balance assessment: Needs assistance Sitting-balance support: Feet supported;No upper extremity supported Sitting balance-Leahy Scale: Fair Sitting balance - Comments: close supervision EOB.    Standing balance support: Bilateral upper extremity supported Standing balance-Leahy Scale: Poor Standing balance comment: needs external assist in standing.                              Pertinent Vitals/Pain Pain Assessment: No/denies pain    Home Living Family/patient expects to be discharged to:: Private residence Living Arrangements: Spouse/significant other Available Help at Discharge: Family(per pt, wife works days) Type of Home: House Home Access: Level entry     Home Layout: One level Lares: None      Prior Function Level of Independence: Independent               Hand Dominance   Dominant Hand: Right    Extremity/Trunk Assessment  Upper Extremity Assessment Upper Extremity Assessment: Overall WFL for tasks assessed    Lower Extremity Assessment Lower Extremity Assessment: Defer to PT evaluation RLE Deficits / Details: Legs are strong, but uncoordinated bil .  LLE Deficits / Details: legs are strong, but uncoordinated bil.     Cervical / Trunk Assessment Cervical / Trunk Assessment: Normal  Communication   Communication: Other (comment)(slurred  speech with facial fractures)  Cognition Arousal/Alertness: Awake/alert Behavior During Therapy: Impulsive;Flat affect;Agitated(easily irritated) Overall Cognitive Status: History of cognitive impairments - at baseline                                 General Comments: No family to report baseline, impulsive, easily irritated by PT slowing the RW down, decreased safety awareness (dove to sit on bed and nearly missed).              Assessment/Plan    PT Assessment Patient needs continued PT services  PT Problem List Decreased activity tolerance;Decreased balance;Decreased mobility;Decreased coordination;Decreased cognition;Decreased knowledge of use of DME;Decreased safety awareness;Decreased knowledge of precautions       PT Treatment Interventions DME instruction;Gait training;Stair training;Functional mobility training;Therapeutic activities;Therapeutic exercise;Balance training;Neuromuscular re-education;Cognitive remediation;Patient/family education    PT Goals (Current goals can be found in the Care Plan section)  Acute Rehab PT Goals Patient Stated Goal: unable to state, wanted to return to bed after walking PT Goal Formulation: Patient unable to participate in goal setting Time For Goal Achievement: 08/31/18 Potential to Achieve Goals: Good    Frequency Min 3X/week           AM-PAC PT "6 Clicks" Mobility  Outcome Measure Help needed turning from your back to your side while in a flat bed without using bedrails?: None Help needed moving from lying on your back to sitting on the side of a flat bed without using bedrails?: None Help needed moving to and from a bed to a chair (including a wheelchair)?: A Little Help needed standing up from a chair using your arms (e.g., wheelchair or bedside chair)?: A Little Help needed to walk in hospital room?: A Little Help needed climbing 3-5 steps with a railing? : A Lot 6 Click Score: 19    End of Session Equipment  Utilized During Treatment: Gait belt Activity Tolerance: Patient tolerated treatment well Patient left: in bed;with call bell/phone within reach;with bed alarm set   PT Visit Diagnosis: Difficulty in walking, not elsewhere classified (R26.2);History of falling (Z91.81);Other symptoms and signs involving the nervous system (Z61.096(R29.898)    Time: [id number] PT Time Calculation (min) (ACUTE ONLY): 18 min   Charges:       Lurena Joinerebecca B. Bela Bonaparte, PT, DPT  Acute Rehabilitation [phone] pager [phone] office  @ [facility] [place]: [phone]   PT Evaluation $PT Eval Moderate Complexity: 1 Mod          08/17/2018, 3:07 PM

## 2018-08-17 NOTE — Evaluation (Signed)
Clinical/Bedside Swallow Evaluation Patient Details  Name: [name] [name] MRN: [id number] Date of Birth: 08/16/1960  Today's Date: 08/17/2018 Time: SLP Start Time (ACUTE ONLY): 0845 SLP Stop Time (ACUTE ONLY): 0910 SLP Time Calculation (min) (ACUTE ONLY): 25 min  Past Medical History:  Past Medical History:  Diagnosis Date  . Alcohol abuse   . ANEMIA, FOLIC ACID DEFICIENCY 08/02/2009   Qualifier: Diagnosis of  By: Sharen HonesGutierrez  MD, Wynona CanesJavier    . Arthritis    left knee  . Bipolar disorder (HCC)   . Cerebral infarct, left corona radiata (HCC) 07/17/2018  . Dementia (HCC), vascular 07/17/2018  . Depression   . DEPRESSION, MAJOR, RECURRENT 04/25/2006   Qualifier: Diagnosis of  By: Seleta Rhymesowand MD, Loraine LericheMark    . Duodenal ulcer 12/06/2014   Qualifier: Diagnosis of  By: Seleta Rhymesowand MD, Loraine LericheMark    . 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 Erectile dysfunction 02/21/2018  . Fall   . Gastrointestinal hemorrhage associated with duodenal ulcer   . GERD (gastroesophageal reflux disease)   . Hepatic encephalopathy (HCC), possible history of 09/03/2010  . Hepatic steatosis 06/09/2010   Diagnosed by [facility] 05/12/10. Likely secondary to chronic ETOH abuse. Will continue to follow.    . History of traumatic brain injury   . Hyperkalemia 12/08/2014  . Hypertension   . Left leg weakness 07/27/2015  . Orbital floor (blow-out) closed fracture (HCC) 05/25/2017   Left orbit, stable.  No surgical intervention required.  . Pneumonia   . Pure hypercholesterolemia 07/25/2018  . Stroke [facility](HCC)    a few spanning from 1997 to last year  . Tobacco dependence    Past Surgical History:  Past Surgical History:  Procedure Laterality Date  . COLONOSCOPY    . ESOPHAGOGASTRODUODENOSCOPY (EGD) WITH PROPOFOL Left 12/09/2014   Procedure: ESOPHAGOGASTRODUODENOSCOPY (EGD) WITH PROPOFOL;  Surgeon: Bernette Redbirdobert Buccini, MD;  [facility];  Service: Endoscopy;  Laterality: Left;  .  EVISCERATION Right 11/06/2017   Procedure: EVISCERATION REPAIR RIGHT EYE;  Surgeon: Carmela RimaPatel, Narendra, MD;  [facility];  Service: Ophthalmology;  Laterality: Right;  . EYE SURGERY     eye lid was split   HPI:      Assessment / Plan / Recommendation Clinical Impression  Patient presents with signs and symptoms of dysphagia with all consistencies tested (thin, puree and soft solid). Symptoms include multiple audible swallows, delayed throat clearing, coughing and reswallow. Belching noticed throughout evaluation approximately 75% of swallows. Suspect an esophageal dysphagia. Recommend a MBS to objectively evaluate pt's swallowing function. Pt to remain NPO with sips/chips for comfort.  SLP Visit Diagnosis: Dysphagia, unspecified (R13.10)    Aspiration Risk  Moderate aspiration risk    Diet Recommendation NPO;Ice chips PRN after oral care;Free water protocol after oral care   Liquid Administration via: Cup Medication Administration: Crushed with puree Supervision: Staff to assist with self feeding Postural Changes: Seated upright at 90 degrees;Remain upright for at least 30 minutes after po intake    Other  Recommendations Oral Care Recommendations: Oral care BID                       Prognosis Prognosis for Safe Diet Advancement: Good Barriers to Reach Goals: Cognitive deficits      Swallow Study   General Date of Onset: 08/16/18 Type of Study: Bedside Swallow Evaluation Previous Swallow Assessment: none Diet Prior to this Study: Regular;Thin liquids(nurse has been keeping him NPO since 08/16/18 due to coughing) Temperature  Spikes Noted: No Respiratory Status: Room air History of Recent Intubation: No Behavior/Cognition: Alert;Cooperative(baseline deficits) Oral Cavity Assessment: Within Functional Limits Oral Care Completed by SLP: No Oral Cavity - Dentition: Adequate natural dentition Vision: Functional for self-feeding Self-Feeding Abilities: Able to feed  self Patient Positioning: Upright in chair Baseline Vocal Quality: Normal Volitional Cough: Strong Volitional Swallow: Able to elicit    Oral/Motor/Sensory Function Overall Oral Motor/Sensory Function: Within functional limits   Ice Chips Ice chips: Within functional limits Presentation: Spoon   Thin Liquid Thin Liquid: Impaired Presentation: Cup;Straw Oral Phase Functional Implications: (audible swallows) Pharyngeal  Phase Impairments: Throat Clearing - Delayed;Cough - Delayed;Multiple swallows    Nectar Thick Nectar Thick Liquid: Not tested   Honey Thick Honey Thick Liquid: Not tested   Puree Puree: Impaired Presentation: Self Fed Pharyngeal Phase Impairments: Multiple swallows;Throat Clearing - Delayed   Solid     Solid: Impaired Presentation: Self Fed Pharyngeal Phase Impairments: Multiple swallows;Throat Clearing - Delayed      [place], MA, CCC-SLP 08/17/2018 9:22 AM

## 2018-08-18 DIAGNOSIS — R410 Disorientation, unspecified: Secondary | ICD-10-CM

## 2018-08-18 LAB — CBC
HCT: 38.8 % — ABNORMAL LOW (ref 39.0–52.0)
Hemoglobin: 12.9 g/dL — ABNORMAL LOW (ref 13.0–17.0)
MCH: 32.7 pg (ref 26.0–34.0)
MCHC: 33.2 g/dL (ref 30.0–36.0)
MCV: 98.5 fL (ref 80.0–100.0)
Platelets: 211 10*3/uL (ref 150–400)
RBC: 3.94 MIL/uL — ABNORMAL LOW (ref 4.22–5.81)
RDW: 12.8 % (ref 11.5–15.5)
WBC: 8.5 10*3/uL (ref 4.0–10.5)
nRBC: 0 % (ref 0.0–0.2)

## 2018-08-18 LAB — BASIC METABOLIC PANEL
Anion gap: 10 (ref 5–15)
BUN: 18 mg/dL (ref 6–20)
CO2: 18 mmol/L — ABNORMAL LOW (ref 22–32)
Calcium: 9.3 mg/dL (ref 8.9–10.3)
Chloride: 107 mmol/L (ref 98–111)
Creatinine, Ser: 1.12 mg/dL (ref 0.61–1.24)
GFR calc Af Amer: >60 mL/min (ref >60)
GFR calc non Af Amer: >60 mL/min (ref >60)
Glucose, Bld: 104 mg/dL — ABNORMAL HIGH (ref 70–99)
Potassium: 3.8 mmol/L (ref 3.5–5.1)
Sodium: 135 mmol/L (ref 135–145)

## 2018-08-18 MED ORDER — ATORVASTATIN CALCIUM 80 MG PO TABS
80.0000 mg | ORAL_TABLET | Freq: Every day | ORAL | Status: DC
  Administered 2018-08-18 – 2018-08-19 (×2): 80 mg via ORAL
  Filled 2018-08-18 (×2): qty 1

## 2018-08-18 MED ORDER — ISOSORBIDE DINITRATE 20 MG PO TABS
20.0000 mg | ORAL_TABLET | Freq: Three times a day (TID) | ORAL | Status: DC
  Administered 2018-08-18 – 2018-08-20 (×7): 20 mg via ORAL
  Filled 2018-08-18 (×9): qty 1

## 2018-08-18 MED ORDER — AMLODIPINE BESYLATE 10 MG PO TABS
10.0000 mg | ORAL_TABLET | Freq: Every day | ORAL | Status: DC
  Administered 2018-08-18 – 2018-08-20 (×3): 10 mg via ORAL
  Filled 2018-08-18 (×3): qty 1

## 2018-08-18 NOTE — Care Management Important Message (Signed)
Important Message  Patient Details  Name: [name] [name] MRN: [id number] Date of Birth: December 12, 1960   Medicare Important Message Given:  Yes     Orbie Pyo 08/18/2018, 2:27 PM

## 2018-08-18 NOTE — Progress Notes (Addendum)
Family Medicine Teaching Service Daily Progress Note Intern Pager: [phone]  Patient name: [name] [name] Medical record number: [id number] Date of birth: September 24, 1960 Age: 58 y.o. Gender: male  Primary Care Provider: Sherene Sires, DO Consultants: Neurosurgery, ENT, General Surgery Code Status: Full  Pt Overview and Major Events to Date:  08/15/2018 - Admission for altered mental status likely secondary to sepsis  Assessment and Plan: [name] [name] is a 58 y.o. male presenting with altered mental status. PMH is significant for Recent CVA (07/17/2018), vascular dementia, alcoholism, bipolar 1, encephalomalacia, HFrEF, HTN, depression.  AMS & Fall likely 2/2 right, lateral chest abscess  Need to wait 5 days  w/o plaxix (6/19-6/24) use before he can go to surgery for I&D.  Afebrile with stable vitals overnight.  No new complaints this morning.  He continues to note tenderness of his right axillary abscess. -vancomycin (6/19-) -cefepime (6/19-) -Plan for I/D with general surgery after 5 days off of Plavix (6/23) -PT HH -OT SNF -SLP regular diet, thin liquids  Mild Hyponatremia-resolved Sodium 135 this a.m. - fluid restrict when able to tolerate PO  Alcohol Abuse  Alcohol Withdrawal Last CIWA score was 4, 4.  Low suspicion for withdrawal - CIWA  Hypertension Home medication includes amlodipine 10 mg daily, hydralazine 10 mg 3 times daily, metoprolol tartrate 100 mg twice daily.  Systolic pressures 400-867, diastolic pressures 61-950 in the past 24 hours -Holding hydralazine, metoprolol -Start amlodipine 10  Recent stroke 07/17/2018  Vascular Dementia Discussed with pharmacy and surgery on 6/21, both parties comfortable with subcu heparin at this time. -Hold Plavix -Heparin drip discontinued on 6/20 -Low threshold for MR head  Cervical fractures, likely not acute - Monitor for neurologic deficit - May keep collar off for now  Nasal bone fracture Discussed with ENT.  No  need for ENT to come into the hospital urgently.  Plan for follow-up in clinic in at least 1 week to permit time for swelling to decrease.  In the meantime can take measures to reduce the risk of damage secondary to nose blowing. -Nasal saline spray/rinse 4 times daily -Avoid nose blowing -Ice for 24 hours  CAD/CVA/HLD Atorvastatin 80 mg taken at home. -holding plavix as above -Atorvastatin 80  Heart failure with reduced ejection fraction Euvolemic on exam.  Last echo in 07/31/2018 showed an EF of 45 to 50%.  Home medication includes Isordil. -Restart Isordil  GERD Protonix taken at home. -Restart Protonix once taking p.o.  Nicotine use Uses NicoDerm 21 mg patch daily at home. -Holding patch due to AMS  MDD Fluoxetine 40 mg taken at home. -Holding due to AMS  FEN/GI: N.p.o. until he passes bedside swallow Prophylaxis: Heparin drip per pharmacy  Disposition: pending improvement in mental status  Subjective:  No new complaints this morning.  No acute events overnight.  Difficult to assess mental status compared to baseline.  He denies chest pain, abdominal pain, nausea but is unclear if he truly understands these questions.  Objective: Temp:  [97.8 F (36.6 C)-98.2 F (36.8 C)] 98 F (36.7 C) (06/22 0335) Pulse Rate:  [79-100] 83 (06/22 0335) Resp:  [14-18] 18 (06/22 0335) BP: (114-145)/(92-107) 134/107 (06/22 0335) SpO2:  [95 %-99 %] 95 % (06/22 0335)  Physical Exam: General: Alert and cooperative and appears to be in no acute distress.  Lying in bed in no acute distress. Cardio: Normal S1 and S2, no S3 or S4. Rhythm is regular. No murmurs or rubs.   Pulm: Clear to auscultation bilaterally, no  crackles, wheezing, or diminished breath sounds. Normal respiratory effort Abdomen: Bowel sounds normal. Abdomen soft and non-tender.  Extremities: No peripheral edema. Warm/ well perfused.  Strong radial pulse. Skin: Receding erythema surrounding right axillary  induration.  Continued tenderness to palpation.    Laboratory: Recent Labs  Lab 08/15/18 1224 08/15/18 1233 08/16/18 1001 08/17/18 0546  WBC 13.2*  --  13.2* 8.4  HGB 13.8 15.0 15.4 13.5  HCT 40.9 44.0 47.1 40.5  PLT 247  --  203 216   Recent Labs  Lab 08/15/18 1224 08/15/18 1233 08/16/18 1001 08/17/18 0547  NA 132* 131* 133* 134*  K 4.3 5.2* 4.5 3.7  CL 99 100 102 103  CO2 20*  --  13* 19*  BUN 12 16 13 14   CREATININE 1.17 1.10 1.02 1.09  CALCIUM 9.5  --  9.5 9.4  PROT 7.3  --   --   --   BILITOT 1.0  --   --   --   ALKPHOS 89  --   --   --   ALT 27  --   --   --   AST 29  --   --   --   GLUCOSE 106* 108* 99 83      Imaging/Diagnostic Tests: Ct Head Wo Contrast  Result Date: 08/15/2018 CLINICAL DATA:  Fall, found down EXAM: CT HEAD WITHOUT CONTRAST CT MAXILLOFACIAL WITHOUT CONTRAST CT CERVICAL SPINE WITHOUT CONTRAST TECHNIQUE: Multidetector CT imaging of the head, cervical spine, and maxillofacial structures were performed using the standard protocol without intravenous contrast. Multiplanar CT image reconstructions of the cervical spine and maxillofacial structures were also generated. COMPARISON:  CT brain, 05/25/2017 FINDINGS: CT HEAD FINDINGS Brain: No evidence of acute infarction, hemorrhage, hydrocephalus, extra-axial collection or mass lesion/mass effect. Extensive periventricular and deep white matter hypodensity and global volume. Vascular: No hyperdense vessel or unexpected calcification. CT FACIAL BONES FINDINGS Skull: Normal. Negative for fracture or focal lesion. Facial bones: Multiple nondisplaced fractures of the nasal bones (series 5, image 37). No displaced fractures or dislocations. Sinuses/Orbits: No acute finding. Right ocular prosthesis. Chronic fracture deformity of the left orbital floor. Other: None. CT CERVICAL SPINE FINDINGS Alignment: Normal. Skull base and vertebrae: Minimally displaced fractures of the right transverse process and vertebral  foramen of the C4 and C5 vertebral bodies (series 15, image 55, 64). There is a tiny, nondisplaced corner type fracture of the anterior inferior endplate of C6 (series 16, image 32). There is a nonacute, although new compared to prior examination, fracture of the C6 spinous process (series 16, image 33). No primary bone lesion or focal pathologic process. Soft tissues and spinal canal: No prevertebral fluid or swelling. No visible canal hematoma. Disc levels:  Intact. Upper chest: Negative. Other: None. IMPRESSION: 1. No acute intracranial pathology. Extensive periventricular and deep white matter hypodensity and global volume, which likely reflects unusually small-vessel white matter disease. 2. Multiple nondisplaced fractures of the nasal bones (series 5, image 37). No other acute displaced fracture of the facial bones. 3. Right ocular prosthesis. Chronic fracture deformity of the left orbital floor. 4. Minimally displaced fractures of the right transverse process and vertebral foramen of the C4 and C5 vertebral bodies (series 15, image 55, 64). There is a tiny, nondisplaced corner type fracture of the anterior inferior endplate of C6 (series 16, image 32), of uncertain acuity. There is a nonacute, although new compared to prior examination, fracture of the C6 spinous process (series 16, image 33). Although new compared to  prior examination dated 05/25/2017, these are of uncertain acuity given appearance and history of multiple falls. Electronically Signed   By: Lauralyn PrimesAlex  Bibbey M.D.   On: 08/15/2018 15:38   Ct Chest W Contrast  Result Date: 08/15/2018 CLINICAL DATA:  Status post fall. Area of redness under the left arm noted clinically. EXAM: CT CHEST WITH CONTRAST TECHNIQUE: Multidetector CT imaging of the chest was performed during intravenous contrast administration. CONTRAST:  75mL OMNIPAQUE IOHEXOL 300 MG/ML  SOLN COMPARISON:  Chest radiograph 08/15/2018 FINDINGS: Cardiovascular: No significant vascular  findings. Normal heart size. No pericardial effusion. Mediastinum/Nodes: No enlarged mediastinal, hilar, or axillary lymph nodes. Thyroid gland, trachea, and esophagus demonstrate no significant findings. Lungs/Pleura: Bibasilar atelectasis. The lungs otherwise clear. Upper Abdomen: No acute abnormality. Musculoskeletal: Mild chronic appearing, with sclerotic borders, compression deformity of the superior endplate of T4 vertebral body. Complex fluid collection within the right lateral chest wall spends approximately 11 cm anterior to posterior dimension. There is mild overlying skin thickening. IMPRESSION: 1. Bibasilar atelectasis. 2. Mild chronic appearing compression deformity of the superior endplate of T4 vertebral body may be posttraumatic or degenerative. 3. Large complex fluid collection within the right lateral chest wall, with overlying skin thickening. This may represent subcutaneous hematoma or an abscess formation. Electronically Signed   By: Ted Mcalpineobrinka  Dimitrova M.D.   On: 08/15/2018 15:40   Ct Cervical Spine Wo Contrast  Result Date: 08/15/2018 CLINICAL DATA:  Fall, found down EXAM: CT HEAD WITHOUT CONTRAST CT MAXILLOFACIAL WITHOUT CONTRAST CT CERVICAL SPINE WITHOUT CONTRAST TECHNIQUE: Multidetector CT imaging of the head, cervical spine, and maxillofacial structures were performed using the standard protocol without intravenous contrast. Multiplanar CT image reconstructions of the cervical spine and maxillofacial structures were also generated. COMPARISON:  CT brain, 05/25/2017 FINDINGS: CT HEAD FINDINGS Brain: No evidence of acute infarction, hemorrhage, hydrocephalus, extra-axial collection or mass lesion/mass effect. Extensive periventricular and deep white matter hypodensity and global volume. Vascular: No hyperdense vessel or unexpected calcification. CT FACIAL BONES FINDINGS Skull: Normal. Negative for fracture or focal lesion. Facial bones: Multiple nondisplaced fractures of the nasal bones  (series 5, image 37). No displaced fractures or dislocations. Sinuses/Orbits: No acute finding. Right ocular prosthesis. Chronic fracture deformity of the left orbital floor. Other: None. CT CERVICAL SPINE FINDINGS Alignment: Normal. Skull base and vertebrae: Minimally displaced fractures of the right transverse process and vertebral foramen of the C4 and C5 vertebral bodies (series 15, image 55, 64). There is a tiny, nondisplaced corner type fracture of the anterior inferior endplate of C6 (series 16, image 32). There is a nonacute, although new compared to prior examination, fracture of the C6 spinous process (series 16, image 33). No primary bone lesion or focal pathologic process. Soft tissues and spinal canal: No prevertebral fluid or swelling. No visible canal hematoma. Disc levels:  Intact. Upper chest: Negative. Other: None. IMPRESSION: 1. No acute intracranial pathology. Extensive periventricular and deep white matter hypodensity and global volume, which likely reflects unusually small-vessel white matter disease. 2. Multiple nondisplaced fractures of the nasal bones (series 5, image 37). No other acute displaced fracture of the facial bones. 3. Right ocular prosthesis. Chronic fracture deformity of the left orbital floor. 4. Minimally displaced fractures of the right transverse process and vertebral foramen of the C4 and C5 vertebral bodies (series 15, image 55, 64). There is a tiny, nondisplaced corner type fracture of the anterior inferior endplate of C6 (series 16, image 32), of uncertain acuity. There is a nonacute, although new compared to prior  examination, fracture of the C6 spinous process (series 16, image 33). Although new compared to prior examination dated 05/25/2017, these are of uncertain acuity given appearance and history of multiple falls. Electronically Signed   By: Lauralyn PrimesAlex  Bibbey M.D.   On: 08/15/2018 15:38   Dg Chest Port 1 View  Result Date: 08/15/2018 CLINICAL DATA:  Found down.  EXAM: PORTABLE CHEST 1 VIEW COMPARISON:  07/26/2015 FINDINGS: The cardiac silhouette, mediastinal and hilar contours are within normal limits given the AP projection and portable technique. Low lung volumes with vascular crowding and streaky basilar atelectasis. No infiltrates, edema or effusions. The bony thorax is grossly intact. Remote healed left rib fractures are noted. IMPRESSION: Low lung volumes with vascular crowding and bibasilar atelectasis. Electronically Signed   By: Rudie MeyerP.  Gallerani M.D.   On: 08/15/2018 14:07   Ct Maxillofacial Wo Contrast  Result Date: 08/15/2018 CLINICAL DATA:  Fall, found down EXAM: CT HEAD WITHOUT CONTRAST CT MAXILLOFACIAL WITHOUT CONTRAST CT CERVICAL SPINE WITHOUT CONTRAST TECHNIQUE: Multidetector CT imaging of the head, cervical spine, and maxillofacial structures were performed using the standard protocol without intravenous contrast. Multiplanar CT image reconstructions of the cervical spine and maxillofacial structures were also generated. COMPARISON:  CT brain, 05/25/2017 FINDINGS: CT HEAD FINDINGS Brain: No evidence of acute infarction, hemorrhage, hydrocephalus, extra-axial collection or mass lesion/mass effect. Extensive periventricular and deep white matter hypodensity and global volume. Vascular: No hyperdense vessel or unexpected calcification. CT FACIAL BONES FINDINGS Skull: Normal. Negative for fracture or focal lesion. Facial bones: Multiple nondisplaced fractures of the nasal bones (series 5, image 37). No displaced fractures or dislocations. Sinuses/Orbits: No acute finding. Right ocular prosthesis. Chronic fracture deformity of the left orbital floor. Other: None. CT CERVICAL SPINE FINDINGS Alignment: Normal. Skull base and vertebrae: Minimally displaced fractures of the right transverse process and vertebral foramen of the C4 and C5 vertebral bodies (series 15, image 55, 64). There is a tiny, nondisplaced corner type fracture of the anterior inferior endplate  of C6 (series 16, image 32). There is a nonacute, although new compared to prior examination, fracture of the C6 spinous process (series 16, image 33). No primary bone lesion or focal pathologic process. Soft tissues and spinal canal: No prevertebral fluid or swelling. No visible canal hematoma. Disc levels:  Intact. Upper chest: Negative. Other: None. IMPRESSION: 1. No acute intracranial pathology. Extensive periventricular and deep white matter hypodensity and global volume, which likely reflects unusually small-vessel white matter disease. 2. Multiple nondisplaced fractures of the nasal bones (series 5, image 37). No other acute displaced fracture of the facial bones. 3. Right ocular prosthesis. Chronic fracture deformity of the left orbital floor. 4. Minimally displaced fractures of the right transverse process and vertebral foramen of the C4 and C5 vertebral bodies (series 15, image 55, 64). There is a tiny, nondisplaced corner type fracture of the anterior inferior endplate of C6 (series 16, image 32), of uncertain acuity. There is a nonacute, although new compared to prior examination, fracture of the C6 spinous process (series 16, image 33). Although new compared to prior examination dated 05/25/2017, these are of uncertain acuity given appearance and history of multiple falls. Electronically Signed   By: Lauralyn PrimesAlex  Bibbey M.D.   On: 08/15/2018 15:38    Mirian MoFrank, Marcel Sorter, MD 08/18/2018, 6:15 AM PGY-2, [facility] FPTS Intern pager: [phone], text pages welcome

## 2018-08-18 NOTE — Progress Notes (Signed)
CC: right side abscess/hematoma  Subjective: Pt is withdrawing, he wants to go home, but does not know where he is.    Objective: Vital signs in last 24 hours: Temp:  [97.7 F (36.5 C)-98.2 F (36.8 C)] 97.7 F (36.5 C) (06/22 0818) Pulse Rate:  [79-88] 79 (06/22 0818) Resp:  [14-18] 18 (06/22 0818) BP: (120-148)/(95-107) 148/98 (06/22 0818) SpO2:  [95 %-99 %] 98 % (06/22 0818) Last BM Date: 08/17/18 120 p.o. record 800 IV Urine 900 Afebrile blood pressures slightly elevated WBC 8.5 hemoglobin 12.9, hematocrit 38.8.   BMP is normal.  Intake/Output from previous day: 06/21 0701 - 06/22 0700 In: 950.1 [P.O.:120; I.V.:169.5; IV Piggyback:660.6] Out: 900 [Urine:900] Intake/Output this shift: No intake/output data recorded.  General appearance: alert, cooperative and does not know where he is. right side pictured below.  he has C- collar off and right eye brusing also   Site is fluctuant and needs draining.   Lab Results:  Recent Labs    08/17/18 0546 08/18/18 0700  WBC 8.4 8.5  HGB 13.5 12.9*  HCT 40.5 38.8*  PLT 216 211    BMET Recent Labs    08/17/18 0547 08/18/18 0700  NA 134* 135  K 3.7 3.8  CL 103 107  CO2 19* 18*  GLUCOSE 83 104*  BUN 14 18  CREATININE 1.09 1.12  CALCIUM 9.4 9.3   PT/INR Recent Labs    08/15/18 1224  LABPROT 12.6  INR 1.0    Recent Labs  Lab 08/15/18 1224  AST 29  ALT 27  ALKPHOS 89  BILITOT 1.0  PROT 7.3  ALBUMIN 2.9*     Lipase     Component Value Date/Time   LIPASE 41 12/07/2014 2004     Prior to Admission medications   Medication Sig Start Date End Date Taking? Authorizing Provider  allopurinol (ZYLOPRIM) 300 MG tablet Take 1 tablet (300 mg total) by mouth daily. 05/28/17  Yes Arvilla MarketWallace, Catherine Lauren, DO  amLODipine (NORVASC) 10 MG tablet Take 1 tablet (10 mg total) by mouth daily. 05/28/17  Yes Arvilla MarketWallace, Catherine Lauren, DO  atorvastatin (LIPITOR) 80 MG tablet Take 1 tablet (80 mg total) by mouth  daily. 07/25/18  Yes McDiarmid, Leighton Roachodd D, MD  clopidogrel (PLAVIX) 75 MG tablet Take 1 tablet (75 mg total) by mouth daily. 07/25/18  Yes McDiarmid, Leighton Roachodd D, MD  diclofenac sodium (VOLTAREN) 1 % GEL Apply 2 g topically 4 (four) times daily. 07/25/18  Yes McDiarmid, Leighton Roachodd D, MD  erythromycin ophthalmic ointment Place 1 application into the right eye 3 (three) times daily. 08/12/18  Yes [provider]  FLUoxetine (PROZAC) 40 MG capsule Take 40 mg by mouth at bedtime.   Yes [provider]  folic acid (FOLVITE) 1 MG tablet TAKE 1 TABLET (1 MG TOTAL) BY MOUTH DAILY. 02/24/18  Yes Marthenia RollingBland, Scott, DO  hydrALAZINE (APRESOLINE) 10 MG tablet Take 1 tablet (10 mg total) by mouth 3 (three) times daily. 08/08/18  Yes McDiarmid, Leighton Roachodd D, MD  isosorbide dinitrate (ISORDIL) 20 MG tablet Take 1 tablet (20 mg total) by mouth 3 (three) times daily. 08/08/18  Yes McDiarmid, Leighton Roachodd D, MD  lactulose (CHRONULAC) 10 GM/15ML solution Take 45 mLs (30 g total) by mouth daily as needed for mild constipation. 02/24/18  Yes Bland, Scott, DO  metoprolol tartrate (LOPRESSOR) 100 MG tablet TAKE 1 TABLET BY MOUTH TWICE A DAY Patient taking differently: Take 100 mg by mouth 2 (two) times daily.  07/04/18  Yes Rumball,  Bryson Ha, DO  omega-3 acid ethyl esters (LOVAZA) 1 g capsule TAKE 1 CAPSULE (1 G TOTAL) BY MOUTH DAILY. Patient taking differently: Take 1 g by mouth daily. TAKE 1 CAPSULE (1 G TOTAL) BY MOUTH DAILY. 02/24/18  Yes Bland, Scott, DO  pantoprazole (PROTONIX) 40 MG tablet Take 1 tablet (40 mg total) by mouth daily. 10/21/17  Yes Bland, Scott, DO  NICOTINE STEP 1 21 MG/24HR patch PLACE 1 PATCH ONTO THE SKIN DAILY. Patient not taking: No sig reported 05/09/17   Nicolette Bang, DO    Medications: . amLODipine  10 mg Oral Daily  . atorvastatin  80 mg Oral q1800  . bacitracin   Topical Daily  . erythromycin  1 application Right Eye TID  . FLUoxetine  40 mg Oral QHS  . folic acid  1 mg Oral Daily  . heparin  injection (subcutaneous)  5,000 Units Subcutaneous Q8H  . isosorbide dinitrate  20 mg Oral TID  . multivitamin with minerals  1 tablet Oral Daily  . sodium chloride  1 spray Each Nare Q6H  . thiamine  100 mg Oral Daily   Or  . thiamine  100 mg Intravenous Daily   . sodium chloride Stopped (08/17/18 2317)  . ceFEPime (MAXIPIME) IV 2 g (08/18/18 0555)  . vancomycin 750 mg (08/18/18 0133)   Assessment/Plan Hx CVA -Plavix 08/14/2018 AMS/fall Alcohol abuse/alcohol withdrawal Old C5-C6 fractures Tobacco use Hx of bipolar disorder/Hx depression GERD/history of duodenal ulcers  Right chest wall abscess status post fall.  FEN: Regular diet ID: Maxipime 6/19 >> day 4; vancomycin 6/19>> day 4 DVT: Heparin Follow-up: TBD POC: Olarte,Sharon Spouse [phone] [place] Daughter   [phone]    Plan:  Will review with Dr. Brantley Stage and discuss timing for surgery.    LOS: 3 days    Cresencia Asmus 08/18/2018 [phone]

## 2018-08-19 ENCOUNTER — Ambulatory Visit: Payer: Medicare Other

## 2018-08-19 ENCOUNTER — Encounter (HOSPITAL_COMMUNITY)
Admission: EM | Disposition: A | Discharge status: Home Health | Payer: Self-pay | Source: Home / Self Care | Attending: Family Medicine

## 2018-08-19 ENCOUNTER — Inpatient Hospital Stay (HOSPITAL_COMMUNITY): Payer: Medicare Other | Admitting: Anesthesiology

## 2018-08-19 ENCOUNTER — Encounter (HOSPITAL_COMMUNITY): Payer: Self-pay

## 2018-08-19 ENCOUNTER — Ambulatory Visit: Payer: BLUE CROSS/BLUE SHIELD | Admitting: Family Medicine

## 2018-08-19 HISTORY — PX: IRRIGATION AND DEBRIDEMENT ABSCESS: SHX5252

## 2018-08-19 LAB — CBC
HCT: 34.9 % — ABNORMAL LOW (ref 39.0–52.0)
Hemoglobin: 11.6 g/dL — ABNORMAL LOW (ref 13.0–17.0)
MCH: 32.8 pg (ref 26.0–34.0)
MCHC: 33.2 g/dL (ref 30.0–36.0)
MCV: 98.6 fL (ref 80.0–100.0)
Platelets: 196 10*3/uL (ref 150–400)
RBC: 3.54 MIL/uL — ABNORMAL LOW (ref 4.22–5.81)
RDW: 12.8 % (ref 11.5–15.5)
WBC: 9.1 10*3/uL (ref 4.0–10.5)
nRBC: 0 % (ref 0.0–0.2)

## 2018-08-19 LAB — VANCOMYCIN, PEAK
Vancomycin Pk: 44 ug/mL — ABNORMAL HIGH (ref 30–40)
Vancomycin Pk: 39 ug/mL (ref 30–40)

## 2018-08-19 LAB — BASIC METABOLIC PANEL
Anion gap: 10 (ref 5–15)
BUN: 18 mg/dL (ref 6–20)
CO2: 19 mmol/L — ABNORMAL LOW (ref 22–32)
Calcium: 9.1 mg/dL (ref 8.9–10.3)
Chloride: 104 mmol/L (ref 98–111)
Creatinine, Ser: 1.22 mg/dL (ref 0.61–1.24)
GFR calc Af Amer: >60 mL/min (ref >60)
GFR calc non Af Amer: >60 mL/min (ref >60)
Glucose, Bld: 137 mg/dL — ABNORMAL HIGH (ref 70–99)
Potassium: 3.3 mmol/L — ABNORMAL LOW (ref 3.5–5.1)
Sodium: 133 mmol/L — ABNORMAL LOW (ref 135–145)

## 2018-08-19 LAB — SURGICAL PCR SCREEN
MRSA, PCR: POSITIVE — AB
Staphylococcus aureus: POSITIVE — AB

## 2018-08-19 SURGERY — MINOR INCISION AND DRAINAGE OF ABSCESS
Anesthesia: General

## 2018-08-19 MED ORDER — OXYCODONE-ACETAMINOPHEN 5-325 MG PO TABS: 1.0000 | ORAL_TABLET | ORAL | Status: DC | PRN

## 2018-08-19 MED ORDER — FENTANYL CITRATE (PF) 100 MCG/2ML IJ SOLN
INTRAMUSCULAR | Status: DC | PRN
  Administered 2018-08-19: 50 ug via INTRAVENOUS

## 2018-08-19 MED ORDER — OXYCODONE HCL 5 MG/5ML PO SOLN: 5.0000 mg | Freq: Once | ORAL | Status: DC | PRN

## 2018-08-19 MED ORDER — ROCURONIUM BROMIDE 10 MG/ML (PF) SYRINGE
PREFILLED_SYRINGE | INTRAVENOUS | Status: AC
  Filled 2018-08-19: qty 10

## 2018-08-19 MED ORDER — MEPERIDINE HCL 25 MG/ML IJ SOLN: 6.2500 mg | INTRAMUSCULAR | Status: DC | PRN

## 2018-08-19 MED ORDER — OXYCODONE HCL 5 MG PO TABS: 5.0000 mg | ORAL_TABLET | Freq: Once | ORAL | Status: DC | PRN

## 2018-08-19 MED ORDER — BUPIVACAINE-EPINEPHRINE (PF) 0.25% -1:200000 IJ SOLN
INTRAMUSCULAR | Status: DC | PRN
  Administered 2018-08-19: 20 mL

## 2018-08-19 MED ORDER — FENTANYL CITRATE (PF) 250 MCG/5ML IJ SOLN
INTRAMUSCULAR | Status: AC
  Filled 2018-08-19: qty 5

## 2018-08-19 MED ORDER — ACETAMINOPHEN 325 MG PO TABS: 325.0000 mg | ORAL_TABLET | ORAL | Status: DC | PRN

## 2018-08-19 MED ORDER — SUCCINYLCHOLINE CHLORIDE 200 MG/10ML IV SOSY
PREFILLED_SYRINGE | INTRAVENOUS | Status: DC | PRN
  Administered 2018-08-19: 200 mg via INTRAVENOUS

## 2018-08-19 MED ORDER — ACETAMINOPHEN 160 MG/5ML PO SOLN: 325.0000 mg | ORAL | Status: DC | PRN

## 2018-08-19 MED ORDER — ONDANSETRON HCL 4 MG/2ML IJ SOLN: 4.0000 mg | Freq: Once | INTRAMUSCULAR | Status: DC | PRN

## 2018-08-19 MED ORDER — DOXYCYCLINE HYCLATE 100 MG PO TABS
100.0000 mg | ORAL_TABLET | Freq: Two times a day (BID) | ORAL | Status: DC
  Administered 2018-08-19 – 2018-08-20 (×2): 100 mg via ORAL
  Filled 2018-08-19 (×2): qty 1

## 2018-08-19 MED ORDER — MIDAZOLAM HCL 2 MG/2ML IJ SOLN
INTRAMUSCULAR | Status: DC | PRN
  Administered 2018-08-19: 2 mg via INTRAVENOUS

## 2018-08-19 MED ORDER — LACTATED RINGERS IV SOLN
INTRAVENOUS | Status: DC
  Administered 2018-08-19: 12:00:00 via INTRAVENOUS

## 2018-08-19 MED ORDER — HYDROMORPHONE HCL 1 MG/ML IJ SOLN: 1.0000 mg | INTRAMUSCULAR | Status: DC | PRN

## 2018-08-19 MED ORDER — MIDAZOLAM HCL 2 MG/2ML IJ SOLN
INTRAMUSCULAR | Status: AC
  Filled 2018-08-19: qty 2

## 2018-08-19 MED ORDER — KETAMINE HCL 50 MG/5ML IJ SOSY
PREFILLED_SYRINGE | INTRAMUSCULAR | Status: AC
  Filled 2018-08-19: qty 5

## 2018-08-19 MED ORDER — LIDOCAINE 2% (20 MG/ML) 5 ML SYRINGE
INTRAMUSCULAR | Status: DC | PRN
  Administered 2018-08-19: 100 mg via INTRAVENOUS

## 2018-08-19 MED ORDER — ENOXAPARIN SODIUM 40 MG/0.4ML ~~LOC~~ SOLN
40.0000 mg | SUBCUTANEOUS | Status: DC
  Administered 2018-08-20: 40 mg via SUBCUTANEOUS
  Filled 2018-08-19: qty 0.4

## 2018-08-19 MED ORDER — ONDANSETRON HCL 4 MG/2ML IJ SOLN
INTRAMUSCULAR | Status: AC
  Filled 2018-08-19: qty 2

## 2018-08-19 MED ORDER — 0.9 % SODIUM CHLORIDE (POUR BTL) OPTIME
TOPICAL | Status: DC | PRN
  Administered 2018-08-19: 1000 mL

## 2018-08-19 MED ORDER — BUPIVACAINE-EPINEPHRINE (PF) 0.25% -1:200000 IJ SOLN
INTRAMUSCULAR | Status: AC
  Filled 2018-08-19: qty 30

## 2018-08-19 MED ORDER — PROPOFOL 10 MG/ML IV BOLUS
INTRAVENOUS | Status: DC | PRN
  Administered 2018-08-19: 200 mg via INTRAVENOUS

## 2018-08-19 MED ORDER — PHENYLEPHRINE 40 MCG/ML (10ML) SYRINGE FOR IV PUSH (FOR BLOOD PRESSURE SUPPORT)
PREFILLED_SYRINGE | INTRAVENOUS | Status: DC | PRN
  Administered 2018-08-19: 80 ug via INTRAVENOUS

## 2018-08-19 MED ORDER — ONDANSETRON HCL 4 MG/2ML IJ SOLN
INTRAMUSCULAR | Status: DC | PRN
  Administered 2018-08-19: 4 mg via INTRAVENOUS

## 2018-08-19 MED ORDER — FENTANYL CITRATE (PF) 100 MCG/2ML IJ SOLN: 25.0000 ug | INTRAMUSCULAR | Status: DC | PRN

## 2018-08-19 SURGICAL SUPPLY — 29 items
BNDG GAUZE ELAST 4 BULKY (GAUZE/BANDAGES/DRESSINGS) ×1 IMPLANT
CANISTER SUCT 3000ML PPV (MISCELLANEOUS) ×2 IMPLANT
COVER SURGICAL LIGHT HANDLE (MISCELLANEOUS) ×2 IMPLANT
COVER WAND RF STERILE (DRAPES) ×2 IMPLANT
DRAPE LAPAROSCOPIC ABDOMINAL (DRAPES) ×2 IMPLANT
DRSG PAD ABDOMINAL 8X10 ST (GAUZE/BANDAGES/DRESSINGS) ×1 IMPLANT
ELECT REM PT RETURN 9FT ADLT (ELECTROSURGICAL) ×2
ELECTRODE REM PT RTRN 9FT ADLT (ELECTROSURGICAL) ×1 IMPLANT
GAUZE SPONGE 4X4 12PLY STRL (GAUZE/BANDAGES/DRESSINGS) IMPLANT
GAUZE SPONGE 4X4 12PLY STRL LF (GAUZE/BANDAGES/DRESSINGS) ×1 IMPLANT
GLOVE BIO SURGEON STRL SZ8 (GLOVE) ×2 IMPLANT
GLOVE BIOGEL PI IND STRL 8 (GLOVE) ×1 IMPLANT
GLOVE BIOGEL PI INDICATOR 8 (GLOVE) ×1
GOWN STRL REUS W/ TWL LRG LVL3 (GOWN DISPOSABLE) ×1 IMPLANT
GOWN STRL REUS W/ TWL XL LVL3 (GOWN DISPOSABLE) ×1 IMPLANT
GOWN STRL REUS W/TWL LRG LVL3 (GOWN DISPOSABLE) ×2
GOWN STRL REUS W/TWL XL LVL3 (GOWN DISPOSABLE) ×2
KIT BASIN OR (CUSTOM PROCEDURE TRAY) ×2 IMPLANT
KIT TURNOVER KIT B (KITS) ×2 IMPLANT
NS IRRIG 1000ML POUR BTL (IV SOLUTION) ×2 IMPLANT
PACK GENERAL/GYN (CUSTOM PROCEDURE TRAY) ×2 IMPLANT
PAD ARMBOARD 7.5X6 YLW CONV (MISCELLANEOUS) ×4 IMPLANT
PENCIL SMOKE EVACUATOR (MISCELLANEOUS) ×2 IMPLANT
SPECIMEN JAR SMALL (MISCELLANEOUS) IMPLANT
SWAB COLLECTION DEVICE MRSA (MISCELLANEOUS) IMPLANT
SWAB CULTURE ESWAB REG 1ML (MISCELLANEOUS) IMPLANT
TAPE CLOTH SURG 4X10 WHT LF (GAUZE/BANDAGES/DRESSINGS) ×1 IMPLANT
TOWEL GREEN STERILE FF (TOWEL DISPOSABLE) ×2 IMPLANT
TOWEL OR 17X26 10 PK STRL BLUE (TOWEL DISPOSABLE) ×2 IMPLANT

## 2018-08-19 NOTE — Anesthesia Procedure Notes (Signed)
Procedure Name: Intubation Date/Time: 08/19/2018 1:27 PM Performed by: Leonor Liv, CRNA Pre-anesthesia Checklist: Patient identified, Emergency Drugs available, Suction available and Patient being monitored Patient Re-evaluated:Patient Re-evaluated prior to induction Oxygen Delivery Method: Circle System Utilized Preoxygenation: Pre-oxygenation with 100% oxygen Induction Type: IV induction and Rapid sequence Laryngoscope Size: Glidescope and 4 Grade View: Grade I Tube type: Oral Number of attempts: 1 Airway Equipment and Method: Stylet and Oral airway Placement Confirmation: ETT inserted through vocal cords under direct vision,  positive ETCO2 and breath sounds checked- equal and bilateral Secured at: 23 cm Tube secured with: Tape Dental Injury: Teeth and Oropharynx as per pre-operative assessment

## 2018-08-19 NOTE — Anesthesia Preprocedure Evaluation (Addendum)
Anesthesia Evaluation  Patient identified by MRN, date of birth, ID band Patient awake    Reviewed: Allergy & Precautions, NPO status , Patient's Chart, lab work & pertinent test results  Airway Mallampati: III  TM Distance: >3 FB Neck ROM: Full    Dental no notable dental hx. (+) Poor Dentition, Chipped, Missing   Pulmonary neg pulmonary ROS, Current Smoker,    Pulmonary exam normal breath sounds clear to auscultation       Cardiovascular hypertension, Pt. on medications negative cardio ROS Normal cardiovascular exam Rhythm:Regular Rate:Normal  ECHO 6/20  Left Ventricle: The left ventricle has mildly reduced systolic function, with an ejection fraction of 45-50%. The cavity size was normal. There is no increase in left ventricular wall thickness. Left ventricular diastolic Doppler parameters are  indeterminate. Left ventrical global hypokinesis without regional wall motion abnormalities.   Neuro/Psych PSYCHIATRIC DISORDERS Depression Bipolar Disorder Dementia History of traumatic brain injury Encephalomalacia, bilateral frontal, traumatic Cerebral infarct, left corona radiata    CVA negative neurological ROS  negative psych ROS   GI/Hepatic negative GI ROS, Neg liver ROS, PUD, GERD  Medicated,(+)     substance abuse  alcohol use,   Endo/Other  negative endocrine ROSMorbid obesity  Renal/GU negative Renal ROS  negative genitourinary   Musculoskeletal negative musculoskeletal ROS (+) Arthritis , Osteoarthritis,    Abdominal   Peds negative pediatric ROS (+)  Hematology negative hematology ROS (+) Blood dyscrasia, anemia ,   Anesthesia Other Findings   Reproductive/Obstetrics negative OB ROS                                                           Anesthesia Evaluation  Anesthesia Quick Evaluation  Anesthesia Physical Anesthesia Plan  ASA: IV  Anesthesia Plan: General   Post-op  Pain Management:    Induction: Intravenous  PONV Risk Score and Plan:   Airway Management Planned: Oral ETT and LMA  Additional Equipment:   Intra-op Plan:   Post-operative Plan: Extubation in OR  Informed Consent:   Plan Discussed with: Anesthesiologist, CRNA and Surgeon  Anesthesia Plan Comments: (  )        Anesthesia Quick Evaluation

## 2018-08-19 NOTE — Op Note (Signed)
Preoperative diagnosis: Right chest wall abscess measuring 8 cm x 4 cm  Postoperative diagnosis same  Procedure: Incision and drainage of 8 cm x 4 cm subcutaneous complex right chest wall abscess  Surgeon: Erroll Luna, MD  Anesthesia: General with 0.25% Sensorcaine local  EBL: 20 cc  Cultures: Sent to pathology  Drains: None  Indications for procedure: The patient is a 58 year old male who fell striking his right chest developing hematoma which subsequently became superinfected.  He has been off his anticoagulation now for 5 days presents for drainage to the operating room.The procedure has been discussed with the patient.  Alternative therapies have been discussed with the patient.  Operative risks include bleeding,  Infection,  Organ injury,  Nerve injury,  Blood vessel injury,  DVT,  Pulmonary embolism,  Death,  And possible reoperation.  Medical management risks include worsening of present situation.  The success of the procedure is 50 -90 % at treating patients symptoms.  The patient understands and agrees to proceed.   Description of procedure: The patient was met in the holding area and the operative site was marked appropriately.  Questions were answered.  He was taken back the operating.  He is placed initially supine where general anesthesia was initiated.  He was then rolled up on his left side and put in left lateral position and was padded appropriately.  The abscess was in the right lateral chest wall almost into the axilla and measured 8 cm and was red and fluctuant.  This was prepped and draped in sterile fashion and timeout was done.  Local anesthetic was infiltrated and a large incision was made across the abscess.  Ellipse of skin was cut out and a a centimeter cavity was identified and washed out.  Culture taken there is copious amounts of pus.  This did not track into the submuscular space.  This was made hemostatic washed out and packed with saline soaked Kerlix.  Dry  dressings applied.  Hemostasis was good.  All final counts correct.  He was then placed back supine extubated taken recovery in satisfactory condition.

## 2018-08-19 NOTE — Progress Notes (Signed)
SLP Cancellation Note  Patient Details Name: [name] [name] MRN: [id number] DOB: 1960/12/24   Cancelled treatment:       Reason Eval/Treat Not Completed: (Pt NPO for procedure)   ST to follow x 1 for diet check   Michal Strzelecki 08/19/2018, 7:56 AM

## 2018-08-19 NOTE — Anesthesia Postprocedure Evaluation (Signed)
Anesthesia Post Note  Patient: [name] [name]  Procedure(s) Performed: INCISION AND DRAINAGE OF CHEST WALL ABSCESS (N/A )     Patient location during evaluation: PACU Anesthesia Type: General Level of consciousness: awake and alert Pain management: pain level controlled Vital Signs Assessment: post-procedure vital signs reviewed and stable Respiratory status: spontaneous breathing, nonlabored ventilation, respiratory function stable and patient connected to nasal cannula oxygen Cardiovascular status: blood pressure returned to baseline and stable Postop Assessment: no apparent nausea or vomiting Anesthetic complications: no    Last Vitals:  Vitals:   08/19/18 1458 08/19/18 1519  BP: (!) 126/95 (!) 123/91  Pulse: 73 74  Resp: 14 14  Temp: 36.9 C (!) 36.4 C  SpO2: 97% 99%    Last Pain:  Vitals:   08/19/18 1519  TempSrc: Oral  PainSc:                  Sherilee Smotherman

## 2018-08-19 NOTE — Progress Notes (Signed)
PT Cancellation Note  Patient Details Name: [name] [name] MRN: [id number] DOB: December 12, 1960   Cancelled Treatment:    Reason Eval/Treat Not Completed: Patient at procedure or test/unavailable. Pt currently off unit for I&D with general surgery. Will continue to follow and progress as able per POC.    Thelma Comp 08/19/2018, 11:26 AM   Rolinda Roan, PT, DPT Acute Rehabilitation Services Pager: [phone] Office: [phone]

## 2018-08-19 NOTE — Progress Notes (Signed)
Patient off the floor for surgery.

## 2018-08-19 NOTE — Progress Notes (Signed)
Patient back from surgery Alert to self and place. disoriented to time and situation  Denies pain Right  Chest surgical incision, dressing in place, clean dry and intact Nurse will continue to monitor

## 2018-08-19 NOTE — Progress Notes (Signed)
Family Medicine Teaching Service Daily Progress Note Intern Pager: [phone]  Patient name: [name] [name] Medical record number: [id number] Date of birth: 18-Dec-1960 Age: 58 y.o. Gender: male  Primary Care Provider: Sherene Sires, DO Consultants: Neurosurgery, ENT, General Surgery Code Status: Full  Pt Overview and Major Events to Date:  08/15/2018 - Admission for altered mental status likely secondary to sepsis  Assessment and Plan: [name] [name] is a 58 y.o. male presenting with altered mental status. PMH is significant for Recent CVA (07/17/2018), vascular dementia, alcoholism, bipolar 1, encephalomalacia, HFrEF, HTN, depression.  AMS & Fall likely 2/2 right, lateral chest abscess  N.p.o. in preparation for surgery on 6/23.  Wife was contacted on 6/22 and is agreeable to come to the hospital on 6/23 to help assess husband determine whether he is far from his baseline mental status.  Afebrile overnight with stable vitals.  Will determine antibiotic duration following incision and drainage. -vancomycin (6/19-) -cefepime (6/19-) -Plan for I/D with general surgery 6/23 -PT HH -OT SNF -SLP regular diet, thin liquids  Mild Hyponatremia-resolved Sodium 135 this a.m. - fluid restrict when able to tolerate PO  Alcohol Abuse  Alcohol Withdrawal Last CIWA score was 4, 4.  Low suspicion for withdrawal - CIWA  Hypertension Home medication includes amlodipine 10 mg daily, hydralazine 10 mg 3 times daily, metoprolol tartrate 100 mg twice daily.  Systolic pressures 287-867, diastolic pressures 67-209 in the past 24 hours -Holding hydralazine, metoprolol -Start amlodipine 10  Recent stroke 07/17/2018  Vascular Dementia Discussed with pharmacy and surgery on 6/21, both parties comfortable with subcu heparin at this time. -Hold Plavix -Heparin drip discontinued on 6/20 -Low threshold for MR head  Cervical fractures, likely not acute - Monitor for neurologic deficit - May keep collar  off for now  Nasal bone fracture Discussed with ENT.  No need for ENT to come into the hospital urgently.  Plan for follow-up in clinic in at least 1 week to permit time for swelling to decrease.  In the meantime can take measures to reduce the risk of damage secondary to nose blowing. -Nasal saline spray/rinse 4 times daily -Avoid nose blowing -Ice for 24 hours  CAD/CVA/HLD Atorvastatin 80 mg taken at home. -holding plavix as above -Atorvastatin 80  Heart failure with reduced ejection fraction Euvolemic on exam.  Last echo in 07/31/2018 showed an EF of 45 to 50%.  Home medication includes Isordil. -Restart Isordil  GERD Protonix taken at home. -Restart Protonix once taking p.o.  Nicotine use Uses NicoDerm 21 mg patch daily at home. -Holding patch due to AMS  MDD Fluoxetine 40 mg taken at home. -Holding due to AMS  FEN/GI: N.p.o. until he passes bedside swallow Prophylaxis: Heparin drip per pharmacy  Disposition: pending improvement in mental status  Subjective:   Objective: Temp:  [97.6 F (36.4 C)-98.7 F (37.1 C)] 98.1 F (36.7 C) (06/23 0343) Pulse Rate:  [71-100] 71 (06/23 0343) Resp:  [18] 18 (06/23 0343) BP: (93-148)/(72-98) 109/87 (06/23 0343) SpO2:  [96 %-98 %] 98 % (06/23 0343)  Physical Exam: General: Alert and cooperative and appears to be in no acute distress.  Lying in bed comfortably.  Alert and oriented to person place and president.  He was aware that he had a bump under his right arm this morning. HEENT: Neck non-tender without lymphadenopathy, masses or thyromegaly Cardio: Normal S1 and S2, no S3 or S4. Rhythm is regular. No murmurs or rubs.   Pulm: Clear to auscultation bilaterally, no crackles, wheezing,  or diminished breath sounds. Normal respiratory effort Abdomen: Bowel sounds normal. Abdomen soft and non-tender.  Extremities: No peripheral edema. Warm/ well perfused.  Strong radial pulse. Neuro: Cranial nerves grossly  intact   Laboratory: Recent Labs  Lab 08/17/18 0546 08/18/18 0700 08/19/18 0542  WBC 8.4 8.5 9.1  HGB 13.5 12.9* 11.6*  HCT 40.5 38.8* 34.9*  PLT 216 211 196   Recent Labs  Lab 08/15/18 1224  08/17/18 0547 08/18/18 0700 08/19/18 0542  NA 132*   < > 134* 135 133*  K 4.3   < > 3.7 3.8 3.3*  CL 99   < > 103 107 104  CO2 20*   < > 19* 18* 19*  BUN 12   < > 14 18 18   CREATININE 1.17   < > 1.09 1.12 1.22  CALCIUM 9.5   < > 9.4 9.3 9.1  PROT 7.3  --   --   --   --   BILITOT 1.0  --   --   --   --   ALKPHOS 89  --   --   --   --   ALT 27  --   --   --   --   AST 29  --   --   --   --   GLUCOSE 106*   < > 83 104* 137*   < > = values in this interval not displayed.    Imaging/Diagnostic Tests: No results found.   Mirian MoFrank, Yukari Flax, MD 08/19/2018, 6:23 AM PGY-2, [facility] FPTS Intern pager: [phone], text pages welcome

## 2018-08-19 NOTE — Transfer of Care (Signed)
Immediate Anesthesia Transfer of Care Note  Patient: JET ARMBRUST  Procedure(s) Performed: INCISION AND DRAINAGE OF CHEST WALL ABSCESS (N/A )  Patient Location: PACU  Anesthesia Type:General  Level of Consciousness: sedated  Airway & Oxygen Therapy: Patient Spontanous Breathing and Patient connected to nasal cannula oxygen  Post-op Assessment: Report given to RN and Post -op Vital signs reviewed and stable  Post vital signs: Reviewed and stable  Last Vitals:  Vitals Value Taken Time  BP 108/89 08/19/18 1413  Temp 36.3 C 08/19/18 1413  Pulse 88 08/19/18 1417  Resp 25 08/19/18 1417  SpO2 100 % 08/19/18 1417  Vitals shown include unvalidated device data.  Last Pain:  Vitals:   08/19/18 0924  TempSrc: Oral  PainSc:          Complications: No apparent anesthesia complications

## 2018-08-20 ENCOUNTER — Encounter (HOSPITAL_COMMUNITY): Payer: Self-pay | Admitting: Surgery

## 2018-08-20 DIAGNOSIS — S0292XS Unspecified fracture of facial bones, sequela: Secondary | ICD-10-CM

## 2018-08-20 LAB — CULTURE, BLOOD (ROUTINE X 2)
Culture: NO GROWTH
Culture: NO GROWTH
Special Requests: ADEQUATE
Special Requests: ADEQUATE

## 2018-08-20 LAB — BASIC METABOLIC PANEL
Anion gap: 10 (ref 5–15)
BUN: 17 mg/dL (ref 6–20)
CO2: 20 mmol/L — ABNORMAL LOW (ref 22–32)
Calcium: 9.2 mg/dL (ref 8.9–10.3)
Chloride: 106 mmol/L (ref 98–111)
Creatinine, Ser: 1.16 mg/dL (ref 0.61–1.24)
GFR calc Af Amer: >60 mL/min (ref >60)
GFR calc non Af Amer: >60 mL/min (ref >60)
Glucose, Bld: 104 mg/dL — ABNORMAL HIGH (ref 70–99)
Potassium: 4.1 mmol/L (ref 3.5–5.1)
Sodium: 136 mmol/L (ref 135–145)

## 2018-08-20 LAB — CBC
HCT: 36.2 % — ABNORMAL LOW (ref 39.0–52.0)
Hemoglobin: 11.9 g/dL — ABNORMAL LOW (ref 13.0–17.0)
MCH: 32.6 pg (ref 26.0–34.0)
MCHC: 32.9 g/dL (ref 30.0–36.0)
MCV: 99.2 fL (ref 80.0–100.0)
Platelets: 224 10*3/uL (ref 150–400)
RBC: 3.65 MIL/uL — ABNORMAL LOW (ref 4.22–5.81)
RDW: 12.8 % (ref 11.5–15.5)
WBC: 7.2 10*3/uL (ref 4.0–10.5)
nRBC: 0 % (ref 0.0–0.2)

## 2018-08-20 LAB — VANCOMYCIN, TROUGH: Vancomycin Tr: 24 ug/mL (ref 15–20)

## 2018-08-20 MED ORDER — OXYCODONE-ACETAMINOPHEN 5-325 MG PO TABS: 1.0000 | ORAL_TABLET | ORAL | 0 refills | Status: DC | PRN

## 2018-08-20 MED ORDER — DOXYCYCLINE HYCLATE 100 MG PO TABS: 100.0000 mg | ORAL_TABLET | Freq: Two times a day (BID) | ORAL | 0 refills | Status: DC

## 2018-08-20 MED ORDER — CLOPIDOGREL BISULFATE 75 MG PO TABS: 75.0000 mg | ORAL_TABLET | Freq: Every day | ORAL | Status: DC

## 2018-08-20 MED ORDER — VANCOMYCIN HCL IN DEXTROSE 1-5 GM/200ML-% IV SOLN: 1000.0000 mg | INTRAVENOUS | Status: DC

## 2018-08-20 NOTE — Discharge Instructions (Signed)
You admitted to the hospital due to an altered mental status which was caused by an infection in your side.  You are found to have an abscess on your right arm.  You were kept in the hospital while you received anti-biotics for your infection.  On 6/23, you had a surgery to drain the abscess in the right arm which was successful and did not have any complications.  Following her surgery, we put you on a different antibiotic which she can take by mouth.  Please continue to take this antibiotic until you finish all of your pills on 6/28.  You are also assessed by physical therapy and Occupational Therapy who thought that it would be helpful for you to continue receiving therapy at home to regain strength.  It will be important to follow-up with surgery in the clinic.  Below please find instructions for appropriate wound care at home: Place normal saline moistened gauze in wound and cover with dry gauze and tape twice a day.  Make sure you wring out the saline as much as possible prior to placing it in the wound.   Remove packing prior to getting in the shower and take a shower without dressings in place.  Replace when you get out of the shower

## 2018-08-20 NOTE — Progress Notes (Signed)
CRITICAL VALUE ALERT  Critical Value:  Vancomycin Trough = 24  Date & Time Notied: 0600  Provider Notified: Family Medicine Teaching service on call  Orders Received/Actions taken

## 2018-08-20 NOTE — Progress Notes (Signed)
Pharmacy Antibiotic Note  [name] [name] is a 58 y.o. male with chest wall abcess s/p I & D 6/23.  Pharmacy has been consulted for Vancomycin.  Vancomycin levels drawn, AUC calculated at 767  Plan: Change Vancomycin 1000 mg IV q24h  Height: 5\' 8"  (172.7 cm) Weight: 209 lb 14.1 oz (95.2 kg) IBW/kg (Calculated) : 68.4  Temp (24hrs), Avg:98.2 F (36.8 C), Min:97.3 F (36.3 C), Max:98.7 F (37.1 C)  Recent Labs  Lab 08/15/18 1224  08/15/18 1828 08/16/18 1001 08/17/18 0546 08/17/18 0547 08/18/18 0700 08/19/18 0542 08/19/18 2035 08/20/18 0452  WBC 13.2*  --   --  13.2* 8.4  --  8.5 9.1  --  7.2  CREATININE 1.17   < >  --  1.02  --  1.09 1.12 1.22  --  1.16  LATICACIDVEN 1.3  --  0.7  --   --   --   --   --   --   --   VANCOTROUGH  --   --   --   --   --   --   --   --   --  24*  VANCOPEAK  --   --   --   --   --   --   --  44* 39  --    < > = values in this interval not displayed.    Estimated Creatinine Clearance: 76.7 mL/min (by C-G formula based on SCr of 1.16 mg/dL).    Allergies  Allergen Reactions  . Ace Inhibitors Anaphylaxis and Swelling    Tongue and face became swollen  . Penicillins Rash    From childhood: Has patient had a PCN reaction causing immediate rash, facial/tongue/throat swelling, SOB or lightheadedness with hypotension: Yes Has patient had a PCN reaction causing severe rash involving mucus membranes or skin necrosis: Unk Has patient had a PCN reaction that required hospitalization: Unk Has patient had a PCN reaction occurring within the last 10 years: No If all of the above answers are "NO", then may proceed with Cephalosporin use.      Caryl Pina 08/20/2018 6:16 AM

## 2018-08-20 NOTE — Progress Notes (Signed)
Physical Therapy Treatment Patient Details Name: [name] [name] MRN: [id number] DOB: Nov 06, 1960 Today's Date: 08/20/2018    History of Present Illness 58 y.o. male admitted on 08/15/18 after wife found him on the floor with AMS.  Dx with AMS/fall likely secondary to R lateral chest abcess, plan for surgery after he is off of plavix for 5 days (08/21/18 possible), hyponatremia, ETOH abuse/withdrawl, recent stroke (07/17/18), vascular dementia (with h/o TBI in 1997), cervical transverse process fx, neurosurgery consulted and initially recommended C-collar, but pt cannot tolerate (ok without per NS), nasal bone fx (follow up with ENT as OP-no nose blowing precaution).  Other significant PMH of HTN, orbital floor blow out fx 04/2017,     PT Comments    Patient seen for mobility progression. Pt continues to demonstrate impaired balance and cognitive deficits increasing risk for falls. Pt requires min/mod A for functional transfer and gait training using RW. Pt will need 24 hour supervision/assistance if d/c home given pt's current mobility level and history of recurrent falls. Continue to progress as tolerated.     Follow Up Recommendations  Home health PT;Supervision/Assistance - 24 hour     Equipment Recommendations  Rolling walker with 5" wheels    Recommendations for Other Services       Precautions / Restrictions Precautions Precautions: Fall Precaution Comments: pt reports he falls "a lot" Restrictions Weight Bearing Restrictions: No    Mobility  Bed Mobility Overal bed mobility: Needs Assistance Bed Mobility: Supine to Sit;Sit to Supine     Supine to sit: Supervision Sit to supine: Supervision   General bed mobility comments: supervision for safety; cues for sequencing when coming into sitting   Transfers Overall transfer level: Needs assistance Equipment used: Rolling walker (2 wheeled) Transfers: Sit to/from Stand Sit to Stand: Min assist;Mod assist         General  transfer comment: min A first trial from EOB with cues for safe hand placement (pt still pulls with both hands on RW) and mod A second trial from EOB after pt impulsively sat down on IV line  Ambulation/Gait Ambulation/Gait assistance: Min assist;Mod assist Gait Distance (Feet): 150 Feet Assistive device: Rolling walker (2 wheeled) Gait Pattern/deviations: Step-through pattern;Ataxic;Trunk flexed;Drifts right/left;Decreased step length - right;Decreased stride length Gait velocity: decreased   General Gait Details: assistance required for balance especially when turning as pt experienced LOB requiring assist to recover and demonstrates unsafe use of RW; pt tends to pick RW up from floor to turn; cues for maintaining safe proximity to RW throughout   Stairs             Wheelchair Mobility    Modified Rankin (Stroke Patients Only)       Balance Overall balance assessment: Needs assistance Sitting-balance support: Feet supported;No upper extremity supported Sitting balance-Leahy Scale: Fair     Standing balance support: Bilateral upper extremity supported Standing balance-Leahy Scale: Poor Standing balance comment: needs external assist in standing.                             Cognition Arousal/Alertness: Awake/alert Behavior During Therapy: Impulsive;Flat affect;Agitated(easily irritated) Overall Cognitive Status: History of cognitive impairments - at baseline                                        Exercises      General Comments  Pertinent Vitals/Pain Pain Assessment: No/denies pain    Home Living                      Prior Function            PT Goals (current goals can now be found in the care plan section) Acute Rehab PT Goals PT Goal Formulation: Patient unable to participate in goal setting Time For Goal Achievement: 08/31/18 Potential to Achieve Goals: Good    Frequency    Min 3X/week      PT  Plan Current plan remains appropriate    Co-evaluation              AM-PAC PT "6 Clicks" Mobility   Outcome Measure  Help needed turning from your back to your side while in a flat bed without using bedrails?: None Help needed moving from lying on your back to sitting on the side of a flat bed without using bedrails?: None Help needed moving to and from a bed to a chair (including a wheelchair)?: A Little Help needed standing up from a chair using your arms (e.g., wheelchair or bedside chair)?: A Little Help needed to walk in hospital room?: A Little Help needed climbing 3-5 steps with a railing? : A Lot 6 Click Score: 19    End of Session Equipment Utilized During Treatment: Gait belt Activity Tolerance: Patient tolerated treatment well Patient left: in bed;with call bell/phone within reach;with bed alarm set Nurse Communication: Mobility status PT Visit Diagnosis: Difficulty in walking, not elsewhere classified (R26.2);History of falling (Z91.81);Other symptoms and signs involving the nervous system (O96.295(R29.898)     Time: [id number] PT Time Calculation (min) (ACUTE ONLY): 27 min  Charges:  $Gait Training: 23-37 mins                     Erline LevineKellyn Jadamarie Butson, PTA Acute Rehabilitation Services Pager: [phone]) [phone] Office: [phone]     Carolynne EdouardKellyn R Wilmar Prabhakar 08/20/2018, 9:26 AM

## 2018-08-20 NOTE — TOC Initial Note (Signed)
Transition of Care [facility]) - Initial/Assessment Note    Patient Details  Name: [name] [name] MRN: [id number] Date of Birth: 1961-02-24  Transition of [facility]) CM/SW Contact:    Pollie Friar, RN Phone Number: 08/20/2018, 1:25 PM  Clinical Narrative:                   Expected Discharge Plan: Home w Home Health Services Barriers to Discharge: No Barriers Identified   Patient Goals and CMS Choice   CMS Medicare.gov Compare Post Acute Care list provided to:: Patient Choice offered to / list presented to : Patient  Expected Discharge Plan and Services Expected Discharge Plan: [place]   Discharge Planning Services: CM Consult Post Acute Care Choice: Home Health   Expected Discharge Date: 08/20/18                         HH Arranged: PT, OT, RN [place] Agency: [place] Date [facility] Agency Contacted: 08/20/18 Time HH Agency Contacted: 1143 Representative spoke with at [place]: [place] Arrangements/Services   Lives with:: Spouse Patient language and need for interpreter reviewed:: Yes(no needs) Do you feel safe going back to the place where you live?: Yes      Need for Family Participation in Patient Care: Yes (Comment) Care giver support system in place?: Yes (comment)(wife coming to hospital to learn about dressing changes) Current home services: Home RN Criminal Activity/Legal Involvement Pertinent to Current Situation/Hospitalization: No - Comment as needed  Activities of Daily Living      Permission Sought/Granted Permission sought to share information with : Case Manager, PCP Permission granted to share information with : Yes, Verbal Permission Granted              Emotional Assessment Appearance:: Appears stated age Attitude/Demeanor/Rapport: Guarded Affect (typically observed): Accepting Orientation: : Oriented to Self, Oriented to Place   Psych Involvement: No (comment)  Admission diagnosis:  Confusion  [R41.0] Chest wall abscess [L02.213] Closed fracture of cervical vertebra, unspecified cervical vertebral level, initial encounter ([place]) [S12.9XXA] Patient Active Problem List   Diagnosis Date Noted  . Confusion   . Chest wall abscess   . Closed fracture of cervical vertebra ([place])   . Facial bones, closed fracture ([place])   . Hyponatremia   . AMS (altered mental status) 08/15/2018  . Left ventricular systolic dysfunction 70/35/0093  . Weight loss, non-intentional 07/25/2018  . Pure hypercholesterolemia 07/25/2018  . Visual impairment 07/25/2018  . Renal mass, right 07/25/2018  . Impaired mobility and ADLs 07/25/2018  . Depression   . Frequent falls 07/17/2018  . Dementia ([place]), vascular 07/17/2018  . Cerebral infarct, left corona radiata ([place]) 07/17/2018  . Chronic pain of left knee 07/30/2017  . Chronic pain of right knee 02/13/2016  . History of stroke   . Total incontinence   . Essential hypertension   . H/O: duodenal ulcer 12/06/2014  . Abnormality of gait 07/28/2009  . Alcoholism ([place]) 07/11/2009  . Bipolar 1 disorder, mixed, moderate ([place]) 06/27/2006  . GOUT, CHRONIC 04/25/2006  . OBESITY, NOS 04/25/2006  . TOBACCO DEPENDENCE 04/25/2006  . History of traumatic brain injury 02/27/1995  . Encephalomalacia, bilateral frontal, traumatic 02/27/1995   PCP:  Sherene Sires, DO Pharmacy:   [id number] - [place], [place] [address] Phone: [phone] Fax: [phone]     Social Determinants of Health (SDOH) Interventions  Readmission Risk Interventions No flowsheet data found.

## 2018-08-20 NOTE — Progress Notes (Signed)
  Speech Language Pathology Treatment: Dysphagia  Patient Details Name: [name] [name] MRN: [id number] DOB: November 25, 1960 Today's Date: 08/20/2018 Time: 9528-4132 SLP Time Calculation (min) (ACUTE ONLY): 15 min  Assessment / Plan / Recommendation Clinical Impression  Pt demonstrated impulsivity, decreased awareness of deficits during lunch meal observation. Filled oral cavity with multiple large bites and no compliance when cued for smaller etc. He did use tongue to remove food intermittently. No overt s/s aspiration - throat clear x 1 during partial observation. He became frustrated with cueing and asked if therapist "was going to stare at him eat his lunch." Explained intention is not to frustrate him but keep him safe and he stated "I'm not hungry" and pushed bedside table across the room. Attempted to encourage him to continue without success. Plans to discharge today and recommend continue ST at next venue.    HPI HPI: 58 y.o. male admitted on 08/15/18 after wife found him on the floor with AMS.  Dx with AMS/fall likely secondary to R lateral chest abcess, plan for surgery after he is off of plavix for 5 days (08/21/18 possible), hyponatremia, ETOH abuse/withdrawl, recent stroke (07/17/18), vascular dementia (with h/o TBI in 1997), cervical transverse process fx), nasal bone fx (follow up with ENT as OP-no nose blowing precaution).  Other significant PMH of HTN, orbital floor blow out fx 04/2017,       SLP Plan  Continue with current plan of care       Recommendations  Diet recommendations: Regular;Thin liquid Liquids provided via: Straw;Cup Medication Administration: Whole meds with puree Supervision: Full supervision/cueing for compensatory strategies;Patient able to self feed Compensations: Slow rate;Small sips/bites;Lingual sweep for clearance of pocketing;Clear throat intermittently Postural Changes and/or Swallow Maneuvers: Seated upright 90 degrees                Oral Care  Recommendations: Oral care BID Follow up Recommendations: Skilled Nursing facility SLP Visit Diagnosis: Dysphagia, unspecified (R13.10) Plan: Continue with current plan of care                       Houston Siren 08/20/2018, 2:44 PM   Orbie Pyo Colvin Caroli.Ed Risk analyst [phone] Office [phone]

## 2018-08-20 NOTE — Progress Notes (Signed)
DSG changed by surgical PA

## 2018-08-20 NOTE — Plan of Care (Signed)
Adequate for discharge.

## 2018-08-20 NOTE — TOC Transition Note (Signed)
Transition of Care [facility]) - CM/SW Discharge Note   Patient Details  Name: [name] [name] MRN: [id number] Date of Birth: 29-May-1960  [facility] CM/SW Contact:  Pollie Friar, RN Phone Number: 08/20/2018, 1:25 PM   Clinical Narrative:    Pt discharging home with new dressing changes. Wife to come to hospital to learn how to change the dressing and HH will reinforce education at home. Hoyle Sauer with [employer] aware of d/c today and resumption orders with dressing changes.  Pt states he already has a walker at home. Wife to provide transport home.    Final next level of care: Home w Home Health Services Barriers to Discharge: No Barriers Identified   Patient Goals and CMS Choice   CMS Medicare.gov Compare Post Acute Care list provided to:: Patient Choice offered to / list presented to : Patient  Discharge Placement                       Discharge Plan and Services   Discharge Planning Services: CM Consult Post Acute Care Choice: Home Health                    HH Arranged: PT, OT, RN [facility] Agency: [place] Date [place]: 08/20/18 Time [place]: 1143 Representative spoke with at [place]: [place] ([place]) Interventions     Readmission Risk Interventions No flowsheet data found.

## 2018-08-20 NOTE — Progress Notes (Signed)
Patient ID: [name] [name], male   DOB: 1960-11-03, 58 y.o.   MRN: [id number]    1 Day Post-Op  Subjective: Laying in bed watching tv.  Doesn't say much.  Pain seems well controlled  Objective: Vital signs in last 24 hours: Temp:  [97.3 F (36.3 C)-98.7 F (37.1 C)] 98.2 F (36.8 C) (06/24 0749) Pulse Rate:  [73-89] 77 (06/24 0749) Resp:  [13-18] 18 (06/24 0749) BP: (101-132)/(73-97) 132/97 (06/24 0749) SpO2:  [93 %-100 %] 96 % (06/24 0749) Last BM Date: 08/17/18  Intake/Output from previous day: 06/23 0701 - 06/24 0700 In: 1049.2 [P.O.:240; I.V.:546.8; IV Piggyback:262.4] Out: 1010 [Urine:1000; Blood:10] Intake/Output this shift: No intake/output data recorded.  PE: Chest wall: chest wall wound is clean.  Packing removed with no further purulent drainage or surrounding erythema.  New dressing and packing placed.  Lab Results:  Recent Labs    08/19/18 0542 08/20/18 0452  WBC 9.1 7.2  HGB 11.6* 11.9*  HCT 34.9* 36.2*  PLT 196 224   BMET Recent Labs    08/19/18 0542 08/20/18 0452  NA 133* 136  K 3.3* 4.1  CL 104 106  CO2 19* 20*  GLUCOSE 137* 104*  BUN 18 17  CREATININE 1.22 1.16  CALCIUM 9.1 9.2   PT/INR No results for input(s): LABPROT, INR in the last 72 hours. CMP     Component Value Date/Time   NA 136 08/20/2018 0452   NA 140 05/28/2017 1617   K 4.1 08/20/2018 0452   CL 106 08/20/2018 0452   CO2 20 (L) 08/20/2018 0452   GLUCOSE 104 (H) 08/20/2018 0452   BUN 17 08/20/2018 0452   BUN 10 05/28/2017 1617   CREATININE 1.16 08/20/2018 0452   CREATININE 1.11 08/12/2015 1417   CALCIUM 9.2 08/20/2018 0452   PROT 7.3 08/15/2018 1224   PROT 7.6 05/28/2017 1617   ALBUMIN 2.9 (L) 08/15/2018 1224   ALBUMIN 4.3 05/28/2017 1617   AST 29 08/15/2018 1224   ALT 27 08/15/2018 1224   ALKPHOS 89 08/15/2018 1224   BILITOT 1.0 08/15/2018 1224   BILITOT 0.5 05/28/2017 1617   GFRNONAA >60 08/20/2018 0452   GFRNONAA 74 08/12/2015 1417   GFRAA >60 08/20/2018  0452   GFRAA 85 08/12/2015 1417   Lipase     Component Value Date/Time   LIPASE 41 12/07/2014 2004       Studies/Results: No results found.  Anti-infectives: Anti-infectives (From admission, onward)   Start     Dose/Rate Route Frequency Ordered Stop   08/20/18 2200  vancomycin (VANCOCIN) IVPB 1000 mg/200 mL premix  Status:  Discontinued     1,000 mg 200 mL/hr over 60 Minutes Intravenous Every 24 hours 08/20/18 0618 08/20/18 0707   08/19/18 1800  doxycycline (VIBRA-TABS) tablet 100 mg     100 mg Oral Every 12 hours 08/19/18 1517 08/25/18 2159   08/16/18 0000  vancomycin (VANCOCIN) IVPB 750 mg/150 ml premix  Status:  Discontinued     750 mg 150 mL/hr over 60 Minutes Intravenous Every 12 hours 08/15/18 2243 08/20/18 0618   08/15/18 2200  ceFEPIme (MAXIPIME) 2 g in sodium chloride 0.9 % 100 mL IVPB  Status:  Discontinued     2 g 200 mL/hr over 30 Minutes Intravenous Every 8 hours 08/15/18 2133 08/19/18 1518   08/15/18 2145  vancomycin (VANCOCIN) IVPB 750 mg/150 ml premix  Status:  Discontinued     750 mg 150 mL/hr over 60 Minutes Intravenous Every 12 hours 08/15/18 2140 08/15/18  2243   08/15/18 1315  vancomycin (VANCOCIN) IVPB 1000 mg/200 mL premix     1,000 mg 200 mL/hr over 60 Minutes Intravenous  Once 08/15/18 1301 08/15/18 1651   08/15/18 1315  ceFEPIme (MAXIPIME) 2 g in sodium chloride 0.9 % 100 mL IVPB     2 g 200 mL/hr over 30 Minutes Intravenous  Once 08/15/18 1301 08/15/18 1540       Assessment/Plan Hx CVA -Plavix 08/14/2018 AMS/fall Alcohol abuse/alcohol withdrawal Old C5-C6 fractures Tobacco use Hx of bipolar disorder/Hx depression GERD/history of duodenal ulcers  POD 1, s/p I&D of Right chest wall abscess status post fall. -wound is clean -start BID dressing changes -d/W RN about getting wife to come in to be taught how to do dressing changes.  Also has HH ordered as well. -patient is surgically stable for DC whenever he is medically stable.  CX with  gram + cocci, given he is MRSA positive this is likely MRSA.  Can transition oral abx as felt indicated by primary team.  FEN: Regular diet ID: Maxipime 6/19 >> day 4; vancomycin 6/19>> day 5 DVT: Heparin Follow up: [facility] clinic in 2-3 weeks   LOS: 5 days    Letha CapeKelly E Aizlyn Schifano , [facility] 08/20/2018, 10:57 AM Pager: [phone]

## 2018-08-20 NOTE — Progress Notes (Signed)
Family Medicine Teaching Service Daily Progress Note Intern Pager: [phone]  Patient name: [name] [name] Medical record number: [id number] Date of birth: 08/08/1960 Age: 58 y.o. Gender: male  Primary Care Provider: Marthenia RollingBland, Scott, DO Consultants: Neurosurgery, ENT, General Surgery Code Status: Full  Pt Overview and Major Events to Date:  08/15/2018 - Admission for altered mental status likely secondary to sepsis  Assessment and Plan: [name] [name] is a 58 y.o. male presenting with altered mental status. PMH is significant for Recent CVA (07/17/2018), vascular dementia, alcoholism, bipolar 1, encephalomalacia, HFrEF, HTN, depression.  AMS & Fall likely 2/2 right, lateral chest abscess-improved Incision and drainage on 6/23 went well, without complication.  Vitals normal following procedure.  Per conversation with surgery, no residual infection was left behind and the wound was filled with purulent discharge.  Following surgery, he was transitioned to oral doxycycline.  Will attempt to speak with wife today regarding baseline mental status to assess if patient is currently at baseline or if we should work toward further improvement.  Alert and oriented to self, location, year this morning.  Rest multiple times that he would like to go home. -S/p vancomycin (6/19-6/23) -S/p cefepime (6/19-6/23) -Doxycycline 100 mg twice daily (6/23-6/28) -PT HH -OT SNF -SLP regular diet, thin liquids  Mild Hyponatremia-resolved -Monitor BMP  Alcohol Abuse  Alcohol Withdrawal Last CIWA score was 4, 4.  Low suspicion for withdrawal - CIWA  Hypertension Home medication includes amlodipine 10 mg daily, hydralazine 10 mg 3 times daily, metoprolol tartrate 100 mg twice daily.  Systolic pressures 120-145, diastolic pressures 92-107 in the past 24 hours -Holding hydralazine, metoprolol -Start amlodipine 10  Recent stroke 07/17/2018  Vascular Dementia Discussed with pharmacy and surgery on 6/21, both  parties comfortable with subcu heparin at this time. -Lovenox restarted at 2 AM 07/2410 hours post surgery -Restart Plavix today  Cervical fractures, likely not acute - Monitor for neurologic deficit - May keep collar off for now  Nasal bone fracture Discussed with ENT.  No need for ENT to come into the hospital urgently.  Plan for follow-up in clinic in at least 1 week to permit time for swelling to decrease.  In the meantime can take measures to reduce the risk of damage secondary to nose blowing. -Nasal saline spray/rinse 4 times daily -Avoid nose blowing -Ice for 24 hours  CAD/CVA/HLD Atorvastatin 80 mg taken at home. -holding plavix as above -Atorvastatin 80  Heart failure with reduced ejection fraction Euvolemic on exam.  Last echo in 07/31/2018 showed an EF of 45 to 50%.  Home medication includes Isordil. -Restart Isordil  GERD Protonix taken at home. -Restart Protonix once taking p.o.  Nicotine use Uses NicoDerm 21 mg patch daily at home. -Holding patch due to AMS  MDD Fluoxetine 40 mg taken at home. -Holding due to AMS  FEN/GI:  General diet Prophylaxis:  Lovenox  Disposition: pending improvement in mental status  Subjective:  Incision and drainage performed without complication on 6/23.  Afebrile with normal vitals following procedure.  No acute events overnight.  This morning, he reports that he would like to go home and be with his wife.  Objective: Temp:  [97.3 F (36.3 C)-98.7 F (37.1 C)] 98.7 F (37.1 C) (06/24 0326) Pulse Rate:  [73-89] 81 (06/24 0326) Resp:  [13-18] 18 (06/24 0326) BP: (101-136)/(73-98) 129/88 (06/24 0326) SpO2:  [93 %-100 %] 99 % (06/24 0326)  Physical Exam: General: Alert and oriented to person, place, year (not president) HEENT: Neck non-tender without  lymphadenopathy, masses or thyromegaly Cardio: Normal S1 and S2, no S3 or S4. Rhythm is regular. No murmurs or rubs.   Pulm: Clear to auscultation bilaterally, no  crackles, wheezing, or diminished breath sounds. Normal respiratory effort Abdomen: Bowel sounds normal. Abdomen soft and non-tender.  Extremities: No peripheral edema.  Radial pulse strong. Neuro: Cranial nerves grossly intact    Laboratory: Recent Labs  Lab 08/18/18 0700 08/19/18 0542 08/20/18 0452  WBC 8.5 9.1 7.2  HGB 12.9* 11.6* 11.9*  HCT 38.8* 34.9* 36.2*  PLT 211 196 224   Recent Labs  Lab 08/15/18 1224  08/18/18 0700 08/19/18 0542 08/20/18 0452  NA 132*   < > 135 133* 136  K 4.3   < > 3.8 3.3* 4.1  CL 99   < > 107 104 106  CO2 20*   < > 18* 19* 20*  BUN 12   < > 18 18 17   CREATININE 1.17   < > 1.12 1.22 1.16  CALCIUM 9.5   < > 9.3 9.1 9.2  PROT 7.3  --   --   --   --   BILITOT 1.0  --   --   --   --   ALKPHOS 89  --   --   --   --   ALT 27  --   --   --   --   AST 29  --   --   --   --   GLUCOSE 106*   < > 104* 137* 104*   < > = values in this interval not displayed.    Imaging/Diagnostic Tests: No results found.   Matilde Haymaker, MD 08/20/2018, 5:53 AM PGY-1, [place] Intern pager: [phone], text pages welcome

## 2018-08-20 NOTE — Discharge Summary (Signed)
[facility] Summary  Patient name: [name] [name] Medical record number: [id number] Date of birth: 02/02/1961 Age: 58 y.o. Gender: male Date of Admission: 08/15/2018  Date of Discharge: 08/20/2018 Admitting Physician: Doreene ElandKehinde T Eniola, MD  Primary Care Provider: Marthenia RollingBland, Scott, DO Consultants: General surgery  Indication for Hospitalization: Altered mental status  Discharge Diagnoses/Problem List:  Altered mental status secondary to infection Mild hyponatremia-resolved Concern for alcohol withdrawal Hypertension Recent CVA 07/17/2018 Cervical fractures Nasal bone fracture CAD/CVA history Heart failure with reduced ejection fraction, chronic GERD Nicotine use MDD  Disposition: Discharge home  Discharge Condition: Stable  Discharge Exam:  General: Alert and oriented to person, place, year (not president) HEENT: Neck non-tender without lymphadenopathy, masses or thyromegaly Cardio: Normal S1 and S2, no S3 or S4. Rhythm is regular. No murmurs or rubs.   Pulm: Clear to auscultation bilaterally, no crackles, wheezing, or diminished breath sounds. Normal respiratory effort Abdomen: Bowel sounds normal. Abdomen soft and non-tender.  Extremities: No peripheral edema.  Radial pulse strong. Neuro: Cranial nerves grossly intact  Brief Hospital Course:  [name] Pattersonnthony L Watersis a 58 y.o.male who presented with altered mental status and sepsis secondary to right axillary abscess. PMH is significant forRecent CVA (07/17/2018),vascular dementia,alcoholism, bipolar 1, encephalomalacia, HFrEF, HTN,depression.  Altered mental status secondary to infection Mr. Witherington had been found at home by his wife after a fall.  He appeared confused at that time and was brought to the ED.  He was found to be altered secondary to a right axillary abscess.  His abscess was treated with IV antibiotics and ultimately incision and drainage.  Over the subsequent 5 days of his  hospitalization, his mental status resolved entirely and he returned to his baseline.  His wife visited on 6/23 and confirmed that he had returned to his baseline mental status.  Right axillary abscess On admission he was found to be septic secondary to an indurated patch in his right axilla extending about 10 cm x 20 cm.  On CT chest this is found to have characteristis consistent with anabscess.  On admission, he was started on vancomycin and cefepime which were continued until his surgical incision and drainage on 6/24.  It was necessary to wait until 6/24 to move forward with surgical incision and drainage to permit time for Plavix washout (he was on Plavix due to recent CVA, Plavix was held on admission and he was anticoagulated with subcu heparin).  Following surgery, he was transitioned to p.o. doxycycline in order to continue MRSA coverage based on purulent drainage and gram-positive cocci on Gram stain.  He was discharged home on a 10-day course of doxycycline (last day 6/28).  Antibiotics: Vancomycin (6/19-6/23) Cefepime (6/19-6/23) Doxycycline (6/23-6/28)  Nasal bone fracture As stated above, he was brought to the ED after experiencing a fall at home.  He apparently landed on his face injuring his nose and right eye.  Maxillary CT showed fractures to the nasal bone and no bony injury related to the lacerations around his right eye.  ENT was consulted and recommended follow-up in their clinic at 1 week to assess the nasal bone fracture.  Also encouraged measures to decrease nose blowing nasal discomfort.  ENT encouraged saline washes of the nose.  Issues for Follow Up:  1. Ensure completion of antibiotic course (last day of doxycycline 6/28) 2. Ensure follow-up with ENT to assess broken nasal bone. 3. Ensure follow-up with surgery to address wound healing of right axillary surgical site.  He was  sent home with orders for home health PT/OT/RN for wound care.  Significant Procedures:   08/19/2018-surgical incision and drainage of left axillary abscess  Significant Labs and Imaging:  Recent Labs  Lab 08/18/18 0700 08/19/18 0542 08/20/18 0452  WBC 8.5 9.1 7.2  HGB 12.9* 11.6* 11.9*  HCT 38.8* 34.9* 36.2*  PLT 211 196 224   Recent Labs  Lab 08/15/18 1224  08/16/18 1001 08/17/18 0547 08/18/18 0700 08/19/18 0542 08/20/18 0452  NA 132*   < > 133* 134* 135 133* 136  K 4.3   < > 4.5 3.7 3.8 3.3* 4.1  CL 99   < > 102 103 107 104 106  CO2 20*  --  13* 19* 18* 19* 20*  GLUCOSE 106*   < > 99 83 104* 137* 104*  BUN 12   < > 13 14 18 18 17   CREATININE 1.17   < > 1.02 1.09 1.12 1.22 1.16  CALCIUM 9.5  --  9.5 9.4 9.3 9.1 9.2  ALKPHOS 89  --   --   --   --   --   --   AST 29  --   --   --   --   --   --   ALT 27  --   --   --   --   --   --   ALBUMIN 2.9*  --   --   --   --   --   --    < > = values in this interval not displayed.    Ct Head Wo Contrast  Result Date: 08/15/2018 CLINICAL DATA:  Fall, found down EXAM: CT HEAD WITHOUT CONTRAST CT MAXILLOFACIAL WITHOUT CONTRAST CT CERVICAL SPINE WITHOUT CONTRAST TECHNIQUE: Multidetector CT imaging of the head, cervical spine, and maxillofacial structures were performed using the standard protocol without intravenous contrast. Multiplanar CT image reconstructions of the cervical spine and maxillofacial structures were also generated. COMPARISON:  CT brain, 05/25/2017 FINDINGS: CT HEAD FINDINGS Brain: No evidence of acute infarction, hemorrhage, hydrocephalus, extra-axial collection or mass lesion/mass effect. Extensive periventricular and deep white matter hypodensity and global volume. Vascular: No hyperdense vessel or unexpected calcification. CT FACIAL BONES FINDINGS Skull: Normal. Negative for fracture or focal lesion. Facial bones: Multiple nondisplaced fractures of the nasal bones (series 5, image 37). No displaced fractures or dislocations. Sinuses/Orbits: No acute finding. Right ocular prosthesis. Chronic fracture  deformity of the left orbital floor. Other: None. CT CERVICAL SPINE FINDINGS Alignment: Normal. Skull base and vertebrae: Minimally displaced fractures of the right transverse process and vertebral foramen of the C4 and C5 vertebral bodies (series 15, image 55, 64). There is a tiny, nondisplaced corner type fracture of the anterior inferior endplate of C6 (series 16, image 32). There is a nonacute, although new compared to prior examination, fracture of the C6 spinous process (series 16, image 33). No primary bone lesion or focal pathologic process. Soft tissues and spinal canal: No prevertebral fluid or swelling. No visible canal hematoma. Disc levels:  Intact. Upper chest: Negative. Other: None. IMPRESSION: 1. No acute intracranial pathology. Extensive periventricular and deep white matter hypodensity and global volume, which likely reflects unusually small-vessel white matter disease. 2. Multiple nondisplaced fractures of the nasal bones (series 5, image 37). No other acute displaced fracture of the facial bones. 3. Right ocular prosthesis. Chronic fracture deformity of the left orbital floor. 4. Minimally displaced fractures of the right transverse process and vertebral foramen of the C4 and C5 vertebral  bodies (series 15, image 55, 64). There is a tiny, nondisplaced corner type fracture of the anterior inferior endplate of C6 (series 16, image 32), of uncertain acuity. There is a nonacute, although new compared to prior examination, fracture of the C6 spinous process (series 16, image 33). Although new compared to prior examination dated 05/25/2017, these are of uncertain acuity given appearance and history of multiple falls. Electronically Signed   By: Lauralyn Primes M.D.   On: 08/15/2018 15:38   Ct Chest W Contrast  Result Date: 08/15/2018 CLINICAL DATA:  Status post fall. Area of redness under the left arm noted clinically. EXAM: CT CHEST WITH CONTRAST TECHNIQUE: Multidetector CT imaging of the chest was  performed during intravenous contrast administration. CONTRAST:  75mL OMNIPAQUE IOHEXOL 300 MG/ML  SOLN COMPARISON:  Chest radiograph 08/15/2018 FINDINGS: Cardiovascular: No significant vascular findings. Normal heart size. No pericardial effusion. Mediastinum/Nodes: No enlarged mediastinal, hilar, or axillary lymph nodes. Thyroid gland, trachea, and esophagus demonstrate no significant findings. Lungs/Pleura: Bibasilar atelectasis. The lungs otherwise clear. Upper Abdomen: No acute abnormality. Musculoskeletal: Mild chronic appearing, with sclerotic borders, compression deformity of the superior endplate of T4 vertebral body. Complex fluid collection within the right lateral chest wall spends approximately 11 cm anterior to posterior dimension. There is mild overlying skin thickening. IMPRESSION: 1. Bibasilar atelectasis. 2. Mild chronic appearing compression deformity of the superior endplate of T4 vertebral body may be posttraumatic or degenerative. 3. Large complex fluid collection within the right lateral chest wall, with overlying skin thickening. This may represent subcutaneous hematoma or an abscess formation. Electronically Signed   By: Ted Mcalpine M.D.   On: 08/15/2018 15:40   Ct Cervical Spine Wo Contrast  Result Date: 08/15/2018 CLINICAL DATA:  Fall, found down EXAM: CT HEAD WITHOUT CONTRAST CT MAXILLOFACIAL WITHOUT CONTRAST CT CERVICAL SPINE WITHOUT CONTRAST TECHNIQUE: Multidetector CT imaging of the head, cervical spine, and maxillofacial structures were performed using the standard protocol without intravenous contrast. Multiplanar CT image reconstructions of the cervical spine and maxillofacial structures were also generated. COMPARISON:  CT brain, 05/25/2017 FINDINGS: CT HEAD FINDINGS Brain: No evidence of acute infarction, hemorrhage, hydrocephalus, extra-axial collection or mass lesion/mass effect. Extensive periventricular and deep white matter hypodensity and global volume. Vascular:  No hyperdense vessel or unexpected calcification. CT FACIAL BONES FINDINGS Skull: Normal. Negative for fracture or focal lesion. Facial bones: Multiple nondisplaced fractures of the nasal bones (series 5, image 37). No displaced fractures or dislocations. Sinuses/Orbits: No acute finding. Right ocular prosthesis. Chronic fracture deformity of the left orbital floor. Other: None. CT CERVICAL SPINE FINDINGS Alignment: Normal. Skull base and vertebrae: Minimally displaced fractures of the right transverse process and vertebral foramen of the C4 and C5 vertebral bodies (series 15, image 55, 64). There is a tiny, nondisplaced corner type fracture of the anterior inferior endplate of C6 (series 16, image 32). There is a nonacute, although new compared to prior examination, fracture of the C6 spinous process (series 16, image 33). No primary bone lesion or focal pathologic process. Soft tissues and spinal canal: No prevertebral fluid or swelling. No visible canal hematoma. Disc levels:  Intact. Upper chest: Negative. Other: None. IMPRESSION: 1. No acute intracranial pathology. Extensive periventricular and deep white matter hypodensity and global volume, which likely reflects unusually small-vessel white matter disease. 2. Multiple nondisplaced fractures of the nasal bones (series 5, image 37). No other acute displaced fracture of the facial bones. 3. Right ocular prosthesis. Chronic fracture deformity of the left orbital floor. 4. Minimally displaced  fractures of the right transverse process and vertebral foramen of the C4 and C5 vertebral bodies (series 15, image 55, 64). There is a tiny, nondisplaced corner type fracture of the anterior inferior endplate of C6 (series 16, image 32), of uncertain acuity. There is a nonacute, although new compared to prior examination, fracture of the C6 spinous process (series 16, image 33). Although new compared to prior examination dated 05/25/2017, these are of uncertain acuity given  appearance and history of multiple falls. Electronically Signed   By: Lauralyn Primes M.D.   On: 08/15/2018 15:38   Dg Chest Port 1 View  Result Date: 08/15/2018 CLINICAL DATA:  Found down. EXAM: PORTABLE CHEST 1 VIEW COMPARISON:  07/26/2015 FINDINGS: The cardiac silhouette, mediastinal and hilar contours are within normal limits given the AP projection and portable technique. Low lung volumes with vascular crowding and streaky basilar atelectasis. No infiltrates, edema or effusions. The bony thorax is grossly intact. Remote healed left rib fractures are noted. IMPRESSION: Low lung volumes with vascular crowding and bibasilar atelectasis. Electronically Signed   By: Rudie Meyer M.D.   On: 08/15/2018 14:07   Ct Maxillofacial Wo Contrast  Result Date: 08/15/2018 CLINICAL DATA:  Fall, found down EXAM: CT HEAD WITHOUT CONTRAST CT MAXILLOFACIAL WITHOUT CONTRAST CT CERVICAL SPINE WITHOUT CONTRAST TECHNIQUE: Multidetector CT imaging of the head, cervical spine, and maxillofacial structures were performed using the standard protocol without intravenous contrast. Multiplanar CT image reconstructions of the cervical spine and maxillofacial structures were also generated. COMPARISON:  CT brain, 05/25/2017 FINDINGS: CT HEAD FINDINGS Brain: No evidence of acute infarction, hemorrhage, hydrocephalus, extra-axial collection or mass lesion/mass effect. Extensive periventricular and deep white matter hypodensity and global volume. Vascular: No hyperdense vessel or unexpected calcification. CT FACIAL BONES FINDINGS Skull: Normal. Negative for fracture or focal lesion. Facial bones: Multiple nondisplaced fractures of the nasal bones (series 5, image 37). No displaced fractures or dislocations. Sinuses/Orbits: No acute finding. Right ocular prosthesis. Chronic fracture deformity of the left orbital floor. Other: None. CT CERVICAL SPINE FINDINGS Alignment: Normal. Skull base and vertebrae: Minimally displaced fractures of the  right transverse process and vertebral foramen of the C4 and C5 vertebral bodies (series 15, image 55, 64). There is a tiny, nondisplaced corner type fracture of the anterior inferior endplate of C6 (series 16, image 32). There is a nonacute, although new compared to prior examination, fracture of the C6 spinous process (series 16, image 33). No primary bone lesion or focal pathologic process. Soft tissues and spinal canal: No prevertebral fluid or swelling. No visible canal hematoma. Disc levels:  Intact. Upper chest: Negative. Other: None. IMPRESSION: 1. No acute intracranial pathology. Extensive periventricular and deep white matter hypodensity and global volume, which likely reflects unusually small-vessel white matter disease. 2. Multiple nondisplaced fractures of the nasal bones (series 5, image 37). No other acute displaced fracture of the facial bones. 3. Right ocular prosthesis. Chronic fracture deformity of the left orbital floor. 4. Minimally displaced fractures of the right transverse process and vertebral foramen of the C4 and C5 vertebral bodies (series 15, image 55, 64). There is a tiny, nondisplaced corner type fracture of the anterior inferior endplate of C6 (series 16, image 32), of uncertain acuity. There is a nonacute, although new compared to prior examination, fracture of the C6 spinous process (series 16, image 33). Although new compared to prior examination dated 05/25/2017, these are of uncertain acuity given appearance and history of multiple falls. Electronically Signed   By: Lauralyn Primes  M.D.   On: 08/15/2018 15:38     Results/Tests Pending at Time of Discharge:  Wound culture rare gram-positive cocci after 24 hours.  Susceptibilities to follow  Discharge Medications:  Allergies as of 08/20/2018      Reactions   Ace Inhibitors Anaphylaxis, Swelling   Tongue and face became swollen   Penicillins Rash   From childhood: Has patient had a PCN reaction causing immediate rash,  facial/tongue/throat swelling, SOB or lightheadedness with hypotension: Yes Has patient had a PCN reaction causing severe rash involving mucus membranes or skin necrosis: Unk Has patient had a PCN reaction that required hospitalization: Unk Has patient had a PCN reaction occurring within the last 10 years: No If all of the above answers are "NO", then may proceed with Cephalosporin use.      Medication List    TAKE these medications   allopurinol 300 MG tablet Commonly known as: ZYLOPRIM Take 1 tablet (300 mg total) by mouth daily.   amLODipine 10 MG tablet Commonly known as: NORVASC Take 1 tablet (10 mg total) by mouth daily.   atorvastatin 80 MG tablet Commonly known as: LIPITOR Take 1 tablet (80 mg total) by mouth daily.   clopidogrel 75 MG tablet Commonly known as: PLAVIX Take 1 tablet (75 mg total) by mouth daily.   diclofenac sodium 1 % Gel Commonly known as: VOLTAREN Apply 2 g topically 4 (four) times daily.   doxycycline 100 MG tablet Commonly known as: VIBRA-TABS Take 1 tablet (100 mg total) by mouth every 12 (twelve) hours.   erythromycin ophthalmic ointment Place 1 application into the right eye 3 (three) times daily.   FLUoxetine 40 MG capsule Commonly known as: PROZAC Take 40 mg by mouth at bedtime.   folic acid 1 MG tablet Commonly known as: FOLVITE TAKE 1 TABLET (1 MG TOTAL) BY MOUTH DAILY.   hydrALAZINE 10 MG tablet Commonly known as: APRESOLINE Take 1 tablet (10 mg total) by mouth 3 (three) times daily.   isosorbide dinitrate 20 MG tablet Commonly known as: ISORDIL Take 1 tablet (20 mg total) by mouth 3 (three) times daily.   lactulose 10 GM/15ML solution Commonly known as: CHRONULAC Take 45 mLs (30 g total) by mouth daily as needed for mild constipation.   metoprolol tartrate 100 MG tablet Commonly known as: LOPRESSOR TAKE 1 TABLET BY MOUTH TWICE A DAY   Nicotine Step 1 21 mg/24hr patch Generic drug: nicotine PLACE 1 PATCH ONTO THE SKIN  DAILY.   omega-3 acid ethyl esters 1 g capsule Commonly known as: LOVAZA TAKE 1 CAPSULE (1 G TOTAL) BY MOUTH DAILY. What changed:   how much to take  how to take this  when to take this   oxyCODONE-acetaminophen 5-325 MG tablet Commonly known as: PERCOCET/ROXICET Take 1 tablet by mouth every 4 (four) hours as needed for moderate pain.   pantoprazole 40 MG tablet Commonly known as: PROTONIX Take 1 tablet (40 mg total) by mouth daily.       Discharge Instructions: Please refer to Patient Instructions section of EMR for full details.  Patient was counseled important signs and symptoms that should prompt return to medical care, changes in medications, dietary instructions, activity restrictions, and follow up appointments.   Follow-Up Appointments: Follow-up Information    [facility], [facility] Follow up.   Specialty: Home Health Services Why: [place] [facility]-agency will call to arrange appointment Contact information: [place] [address] [phone]  [facility] [place] Go on 09/09/2018.   Specialty: General Surgery Why: Follow up scheduled for 10:00 AM. Please arrive 30 minutes prior to scheduled appointment time and bring your insurance card and photo ID with you Contact information: [address] [place] [id number] [phone]           Mirian MoFrank, Tiffini Blacksher, MD 08/20/2018, 2:39 PM PGY-1, [facility]

## 2018-08-21 ENCOUNTER — Emergency Department (HOSPITAL_COMMUNITY): Payer: Medicare Other

## 2018-08-21 ENCOUNTER — Encounter (HOSPITAL_COMMUNITY): Payer: Self-pay | Admitting: Emergency Medicine

## 2018-08-21 ENCOUNTER — Emergency Department (HOSPITAL_COMMUNITY)
Admission: EM | Admit: 2018-08-21 | Discharge: 2018-08-21 | Disposition: A | Discharge status: Home / Self Care | Payer: Medicare Other | Attending: Emergency Medicine | Admitting: Emergency Medicine

## 2018-08-21 ENCOUNTER — Other Ambulatory Visit: Payer: Self-pay

## 2018-08-21 ENCOUNTER — Telehealth: Payer: Self-pay

## 2018-08-21 DIAGNOSIS — F0150 Vascular dementia without behavioral disturbance: Secondary | ICD-10-CM | POA: Insufficient documentation

## 2018-08-21 DIAGNOSIS — Z7901 Long term (current) use of anticoagulants: Secondary | ICD-10-CM | POA: Insufficient documentation

## 2018-08-21 DIAGNOSIS — Z79899 Other long term (current) drug therapy: Secondary | ICD-10-CM | POA: Diagnosis not present

## 2018-08-21 DIAGNOSIS — I10 Essential (primary) hypertension: Secondary | ICD-10-CM | POA: Diagnosis not present

## 2018-08-21 DIAGNOSIS — F17210 Nicotine dependence, cigarettes, uncomplicated: Secondary | ICD-10-CM | POA: Insufficient documentation

## 2018-08-21 DIAGNOSIS — L7622 Postprocedural hemorrhage and hematoma of skin and subcutaneous tissue following other procedure: Secondary | ICD-10-CM | POA: Diagnosis not present

## 2018-08-21 DIAGNOSIS — Z8673 Personal history of transient ischemic attack (TIA), and cerebral infarction without residual deficits: Secondary | ICD-10-CM | POA: Insufficient documentation

## 2018-08-21 DIAGNOSIS — Z20828 Contact with and (suspected) exposure to other viral communicable diseases: Secondary | ICD-10-CM | POA: Insufficient documentation

## 2018-08-21 DIAGNOSIS — L7682 Other postprocedural complications of skin and subcutaneous tissue: Secondary | ICD-10-CM

## 2018-08-21 LAB — BASIC METABOLIC PANEL
Anion gap: 11 (ref 5–15)
BUN: 20 mg/dL (ref 6–20)
CO2: 18 mmol/L — ABNORMAL LOW (ref 22–32)
Calcium: 9.1 mg/dL (ref 8.9–10.3)
Chloride: 106 mmol/L (ref 98–111)
Creatinine, Ser: 1.5 mg/dL — ABNORMAL HIGH (ref 0.61–1.24)
GFR calc Af Amer: 58 mL/min — ABNORMAL LOW (ref >60)
GFR calc non Af Amer: 50 mL/min — ABNORMAL LOW (ref >60)
Glucose, Bld: 136 mg/dL — ABNORMAL HIGH (ref 70–99)
Potassium: 4.1 mmol/L (ref 3.5–5.1)
Sodium: 135 mmol/L (ref 135–145)

## 2018-08-21 LAB — HEPATIC FUNCTION PANEL
ALT: 67 U/L — ABNORMAL HIGH (ref 0–44)
AST: 47 U/L — ABNORMAL HIGH (ref 15–41)
Albumin: 2.4 g/dL — ABNORMAL LOW (ref 3.5–5.0)
Alkaline Phosphatase: 71 U/L (ref 38–126)
Bilirubin, Direct: 0.1 mg/dL (ref 0.0–0.2)
Indirect Bilirubin: 0.5 mg/dL (ref 0.3–0.9)
Total Bilirubin: 0.6 mg/dL (ref 0.3–1.2)
Total Protein: 6.4 g/dL — ABNORMAL LOW (ref 6.5–8.1)

## 2018-08-21 LAB — CBC WITH DIFFERENTIAL/PLATELET
Abs Immature Granulocytes: 0.2 10*3/uL — ABNORMAL HIGH (ref 0.00–0.07)
Basophils Absolute: 0.1 10*3/uL (ref 0.0–0.1)
Basophils Relative: 1 %
Eosinophils Absolute: 0.2 10*3/uL (ref 0.0–0.5)
Eosinophils Relative: 2 %
HCT: 34.2 % — ABNORMAL LOW (ref 39.0–52.0)
Hemoglobin: 10.6 g/dL — ABNORMAL LOW (ref 13.0–17.0)
Immature Granulocytes: 2 %
Lymphocytes Relative: 32 %
Lymphs Abs: 2.7 10*3/uL (ref 0.7–4.0)
MCH: 32.6 pg (ref 26.0–34.0)
MCHC: 31 g/dL (ref 30.0–36.0)
MCV: 105.2 fL — ABNORMAL HIGH (ref 80.0–100.0)
Monocytes Absolute: 0.7 10*3/uL (ref 0.1–1.0)
Monocytes Relative: 9 %
Neutro Abs: 4.6 10*3/uL (ref 1.7–7.7)
Neutrophils Relative %: 54 %
Platelets: 255 10*3/uL (ref 150–400)
RBC: 3.25 MIL/uL — ABNORMAL LOW (ref 4.22–5.81)
RDW: 12.8 % (ref 11.5–15.5)
WBC: 8.4 10*3/uL (ref 4.0–10.5)
nRBC: 0 % (ref 0.0–0.2)

## 2018-08-21 LAB — TYPE AND SCREEN
ABO/RH(D): O POS
Antibody Screen: NEGATIVE

## 2018-08-21 LAB — PROTIME-INR
INR: 1.1 (ref 0.8–1.2)
Prothrombin Time: 13.9 seconds (ref 11.4–15.2)

## 2018-08-21 MED ORDER — DOXYCYCLINE HYCLATE 100 MG PO TABS
100.0000 mg | ORAL_TABLET | Freq: Once | ORAL | Status: AC
  Administered 2018-08-21: 100 mg via ORAL
  Filled 2018-08-21: qty 1

## 2018-08-21 MED ORDER — LACTATED RINGERS IV BOLUS
1000.0000 mL | Freq: Once | INTRAVENOUS | Status: AC
  Administered 2018-08-21: 1000 mL via INTRAVENOUS

## 2018-08-21 NOTE — ED Provider Notes (Signed)
[facility] EMERGENCY DEPARTMENT Provider Note   CSN: [id number] Arrival date & time: 08/21/18  1533    History   Chief Complaint Chief Complaint  Patient presents with  . Hypotension    HPI [name] [name] is a 58 y.o. male.     The history is provided by the patient, the EMS personnel and medical records.  Wound Check This is a new problem. The current episode started 2 days ago. The problem occurs constantly. The problem has been gradually worsening. Pertinent negatives include no abdominal pain, no headaches and no shortness of breath. Nothing aggravates the symptoms. Nothing relieves the symptoms. He has tried rest, a warm compress and a cold compress (Packing wound) for the symptoms. The treatment provided no relief.    Past Medical History:  Diagnosis Date  . Alcohol abuse   . ANEMIA, FOLIC ACID DEFICIENCY 04/06/5186   Qualifier: Diagnosis of  By: Danise Mina  MD, Garlon Hatchet    . Arthritis    left knee  . Bipolar disorder ([place])   . Cerebral infarct, left corona radiata ([place]) 07/17/2018  . Dementia ([place]), vascular 07/17/2018  . Depression   . DEPRESSION, MAJOR, RECURRENT 04/25/2006   Qualifier: Diagnosis of  By: Darylene Price MD, Elta Gu[place]    . Duodenal ulcer 12/06/2014   Qualifier: Diagnosis of  By: Darylene Price MD, Elta [place]    . 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 Erectile dysfunction 02/21/2018  . Fall   . Gastrointestinal hemorrhage associated with duodenal ulcer   . GERD (gastroesophageal reflux disease)   . Hepatic encephalopathy ([place]), possible history of 09/03/2010  . Hepatic steatosis 06/09/2010   Diagnosed by U/S 05/12/10. Likely secondary to chronic ETOH abuse. Will continue to follow.    . History of traumatic brain injury   . Hyperkalemia 12/08/2014  . Hypertension   . Left leg weakness 07/27/2015  . Orbital floor (blow-out) closed fracture ([place]) 05/25/2017   Left orbit, stable.  No surgical  intervention required.  . Pneumonia   . Pure hypercholesterolemia 07/25/2018  . Stroke [facility])    a few spanning from 1997 to last year  . Tobacco dependence     Patient Active Problem List   Diagnosis Date Noted  . Confusion   . Chest wall abscess   . Closed fracture of cervical vertebra ([place])   . Facial bones, closed fracture ([place])   . Hyponatremia   . AMS (altered mental status) 08/15/2018  . Left ventricular systolic dysfunction 41/66/0630  . Weight loss, non-intentional 07/25/2018  . Pure hypercholesterolemia 07/25/2018  . Visual impairment 07/25/2018  . Renal mass, right 07/25/2018  . Impaired mobility and ADLs 07/25/2018  . Depression   . Frequent falls 07/17/2018  . Dementia ([place]), vascular 07/17/2018  . Cerebral infarct, left corona radiata ([place]) 07/17/2018  . Chronic pain of left knee 07/30/2017  . Chronic pain of right knee 02/13/2016  . History of stroke   . Total incontinence   . Essential hypertension   . H/O: duodenal ulcer 12/06/2014  . Abnormality of gait 07/28/2009  . Alcoholism ([place]) 07/11/2009  . Bipolar 1 disorder, mixed, moderate ([place]) 06/27/2006  . GOUT, CHRONIC 04/25/2006  . OBESITY, NOS 04/25/2006  . TOBACCO DEPENDENCE 04/25/2006  . History of traumatic brain injury 02/27/1995  . Encephalomalacia, bilateral frontal, traumatic 02/27/1995    Past Surgical History:  Procedure Laterality Date  . COLONOSCOPY    . ESOPHAGOGASTRODUODENOSCOPY (EGD) WITH PROPOFOL Left 12/09/2014   Procedure: ESOPHAGOGASTRODUODENOSCOPY (EGD)  WITH PROPOFOL;  Surgeon: Bernette Redbirdobert Buccini, MD;  [facility];  Service: Endoscopy;  Laterality: Left;  . EVISCERATION Right 11/06/2017   Procedure: EVISCERATION REPAIR RIGHT EYE;  Surgeon: Carmela RimaPatel, Narendra, MD;  Location[facility] OR;  Service: Ophthalmology;  Laterality: Right;  . EYE SURGERY     eye lid was split  . IRRIGATION AND DEBRIDEMENT ABSCESS N/A 08/19/2018   Procedure: INCISION AND DRAINAGE OF CHEST WALL ABSCESS;  Surgeon:  Harriette Bouillonornett, Thomas, MD;  Location: [facility] OR;  Service: General;  Laterality: N/A;        Home Medications    Prior to Admission medications   Medication Sig Start Date End Date Taking? Authorizing Provider  allopurinol (ZYLOPRIM) 300 MG tablet Take 1 tablet (300 mg total) by mouth daily. 05/28/17   Arvilla MarketWallace, Catherine Lauren, DO  amLODipine (NORVASC) 10 MG tablet Take 1 tablet (10 mg total) by mouth daily. 05/28/17   Arvilla MarketWallace, Catherine Lauren, DO  atorvastatin (LIPITOR) 80 MG tablet Take 1 tablet (80 mg total) by mouth daily. 07/25/18   McDiarmid, Leighton Roachodd D, MD  clopidogrel (PLAVIX) 75 MG tablet Take 1 tablet (75 mg total) by mouth daily. 07/25/18   McDiarmid, Leighton Roachodd D, MD  diclofenac sodium (VOLTAREN) 1 % GEL Apply 2 g topically 4 (four) times daily. 07/25/18   McDiarmid, Leighton Roachodd D, MD  doxycycline (VIBRA-TABS) 100 MG tablet Take 1 tablet (100 mg total) by mouth every 12 (twelve) hours. 08/20/18   Mirian MoFrank, Peter, MD  erythromycin ophthalmic ointment Place 1 application into the right eye 3 (three) times daily. 08/12/18   [provider]  FLUoxetine (PROZAC) 40 MG capsule Take 40 mg by mouth at bedtime.    [provider]  folic acid (FOLVITE) 1 MG tablet TAKE 1 TABLET (1 MG TOTAL) BY MOUTH DAILY. 02/24/18   Marthenia RollingBland, Scott, DO  hydrALAZINE (APRESOLINE) 10 MG tablet Take 1 tablet (10 mg total) by mouth 3 (three) times daily. 08/08/18   McDiarmid, Leighton Roachodd D, MD  isosorbide dinitrate (ISORDIL) 20 MG tablet Take 1 tablet (20 mg total) by mouth 3 (three) times daily. 08/08/18   McDiarmid, Leighton Roachodd D, MD  lactulose (CHRONULAC) 10 GM/15ML solution Take 45 mLs (30 g total) by mouth daily as needed for mild constipation. 02/24/18   Marthenia RollingBland, Scott, DO  metoprolol tartrate (LOPRESSOR) 100 MG tablet TAKE 1 TABLET BY MOUTH TWICE A DAY Patient taking differently: Take 100 mg by mouth 2 (two) times daily.  07/04/18   Ellwood Denseumball, Alison, DO  NICOTINE STEP 1 21 MG/24HR patch PLACE 1 PATCH ONTO THE SKIN DAILY. Patient not taking: No  sig reported 05/09/17   Arvilla MarketWallace, Catherine Lauren, DO  omega-3 acid ethyl esters (LOVAZA) 1 g capsule TAKE 1 CAPSULE (1 G TOTAL) BY MOUTH DAILY. Patient taking differently: Take 1 g by mouth daily. TAKE 1 CAPSULE (1 G TOTAL) BY MOUTH DAILY. 02/24/18   Marthenia RollingBland, Scott, DO  oxyCODONE-acetaminophen (PERCOCET/ROXICET) 5-325 MG tablet Take 1 tablet by mouth every 4 (four) hours as needed for moderate pain. 08/20/18   Mirian MoFrank, Peter, MD  pantoprazole (PROTONIX) 40 MG tablet Take 1 tablet (40 mg total) by mouth daily. 10/21/17   Marthenia RollingBland, Scott, DO    Family History No family history on file.  Social History Social History   Tobacco Use  . Smoking status: Current Every Day Smoker    Packs/day: 0.50    Years: 30.00    Pack years: 15.00    Types: Cigarettes  . Smokeless tobacco: Never Used  . Tobacco comment: does  not want to quitt right now  Substance Use Topics  . Alcohol use: Yes    Alcohol/week: 14.0 standard drinks    Types: 14 Cans of beer per week    Comment: " every friday I get a 5th "  . Drug use: No     Allergies   Ace inhibitors and Penicillins   Review of Systems Review of Systems  Respiratory: Negative for shortness of breath.   Gastrointestinal: Negative for abdominal pain.  Neurological: Negative for headaches.  All other systems reviewed and are negative.    Physical Exam Updated Vital Signs BP (!) 125/94   Pulse 85   Temp (!) 96.9 F (36.1 C) (Temporal)   Resp 16   Ht 5\' 8"  (1.727 m)   Wt 95.2 kg   SpO2 99%   BMI 31.91 kg/m   Physical Exam Vitals signs and nursing note reviewed.  Constitutional:      Appearance: He is well-developed. He is ill-appearing.  HENT:     Head: Normocephalic and atraumatic.     Mouth/Throat:     Mouth: Mucous membranes are dry.     Comments: Tongue deviates to the left  Right eye enucleated Eyes:     Comments: Left orbit extraocular movements intact, pupil 3 mm, reactive, no obvious conjunctival abnormalities  Neck:      Musculoskeletal: Normal range of motion and neck supple. No neck rigidity.  Cardiovascular:     Rate and Rhythm: Tachycardia present.  Pulmonary:     Effort: Pulmonary effort is normal. No respiratory distress.     Breath sounds: No stridor. No wheezing or rhonchi.     Comments: Right-sided chest wound in place  Chest wound packed with gauze  Minimal venous oozing, combat dressing placed with adhesive dressing over the top  No obvious induration or cellulitic changes around wound  No obvious crepitus, no obvious tenderness with palpation around wound Chest:     Chest wall: Tenderness present.  Abdominal:     Palpations: Abdomen is soft.     Tenderness: There is no abdominal tenderness. There is no guarding or rebound.  Musculoskeletal:     Right lower leg: No edema.     Left lower leg: No edema.  Skin:    General: Skin is warm and dry.  Neurological:     Mental Status: He is alert and oriented to person, place, and time.     Comments: At neuro baseline, has left hemibody weakness and sensory deficits secondary to prior CVA      ED Treatments / Results  Labs (all labs ordered are listed, but only abnormal results are displayed) Labs Reviewed  CBC WITH DIFFERENTIAL/PLATELET - Abnormal; Notable for the following components:      Result Value   RBC 3.25 (*)    Hemoglobin 10.6 (*)    HCT 34.2 (*)    MCV 105.2 (*)    Abs Immature Granulocytes 0.20 (*)    All other components within normal limits  BASIC METABOLIC PANEL - Abnormal; Notable for the following components:   CO2 18 (*)    Glucose, Bld 136 (*)    Creatinine, Ser 1.50 (*)    GFR calc non Af Amer 50 (*)    GFR calc Af Amer 58 (*)    All other components within normal limits  HEPATIC FUNCTION PANEL - Abnormal; Notable for the following components:   Total Protein 6.4 (*)    Albumin 2.4 (*)    AST 47 (*)  ALT 67 (*)    All other components within normal limits  NOVEL CORONAVIRUS, NAA (HOSPITAL ORDER, SEND-OUT  TO REF LAB)  PROTIME-INR  TYPE AND SCREEN    EKG None  Radiology Dg Chest Portable 1 View  Result Date: 08/21/2018 CLINICAL DATA:  Postoperative bleeding EXAM: PORTABLE CHEST 1 VIEW COMPARISON:  Portable exam 1543 hours compared to 08/15/2018 FINDINGS: Borderline enlargement of cardiac silhouette. Mediastinal contours and pulmonary vascularity normal. Increased opacity LEFT base question atelectasis versus infiltrate. Remaining lungs clear. No pleural effusion or pneumothorax. Bones demineralized. IMPRESSION: Atelectasis versus infiltrate at LEFT lung base. Electronically Signed   By: Ulyses SouthwardMark  Boles M.D.   On: 08/21/2018 16:23    Procedures Procedures (including critical care time)  Medications Ordered in ED Medications  lactated ringers bolus 1,000 mL (1,000 mLs Intravenous New Bag/Given 08/21/18 1547)  doxycycline (VIBRA-TABS) tablet 100 mg (100 mg Oral Given 08/21/18 1547)     Initial Impression / Assessment and Plan / ED Course  I have reviewed the triage vital signs and the nursing notes.  Pertinent labs & imaging results that were available during my care of the patient were reviewed by me and considered in my medical decision making (see chart for details).       Medical Decision Making: Jacobo Forestnthony L Chaplin is a 58 y.o. male who presented to the ED today with postop bleeding.  Past medical history significant for Recent CVA (07/17/2018),vascular dementia,alcoholism, bipolar 1, encephalomalacia, HFrEF, HTN,depression, recently admitted, found to have right axillary abscess which was treated with antibiotics, taken to the OR and drained on 08/19/2018, discharged on p.o. antibiotics Reviewed and confirmed nursing documentation for past medical history, family history, social history.  Home health nurse came to visit patient, change dressing, significant bleeding from postop wound that was persistent, could not get bleeding to stop so called EMS, initially patient was tachycardic and  hypotensive with EMS, blood pressure as low as 60s systolic, bilateral IV access was established, IV fluid resuscitation started  On my initial exam, the pt was appears ill, tachycardic to 110 bpm, hypotensive systolics in the 90s, alert and mentating appropriately, GCS 15, no increased work of breathing or respiratory distress. Type and screen sent, IV fluid bolus given.  Hemoglobin 10.6 All radiology and laboratory studies reviewed independently and with my attending physician, agree with reading provided by radiologist unless otherwise noted.  On repeat physical exam of wound, no obvious oozing or bleeding. Upon reassessing patient, patient was calm, resting comfortably, blood pressure 125/90, blood pressure stabilized after IV fluids, patient grossly asymptomatic, requesting discharge home Based on the above findings, I believe patient is hemodynamically stable for discharge.  Patient educated about specific return precautions for given chief complaint and symptoms.  Patient educated about follow-up with PCP.  Patient expressed understanding of return precautions and need for follow-up.  Patient discharged.  The above care was discussed with and agreed upon by my attending physician. Emergency Department Medication Summary:  Medications  lactated ringers bolus 1,000 mL (1,000 mLs Intravenous New Bag/Given 08/21/18 1547)  doxycycline (VIBRA-TABS) tablet 100 mg (100 mg Oral Given 08/21/18 1547)   Final Clinical Impressions(s) / ED Diagnoses   Final diagnoses:  Other postoperative complication of subcutaneous tissue    ED Discharge Orders    None       Erick Alleyasey, Makoa Satz, MD 08/21/18 Janett [id number]    Nanavati, Ankit, MD 08/22/18 [phone]

## 2018-08-21 NOTE — Telephone Encounter (Signed)
Lavella Lemons from [facility] calling to have orders for nursing and PT for pt. He is being discharged from the hospital. Please call [phone]. Ottis Stain, CMA

## 2018-08-21 NOTE — Discharge Instructions (Addendum)

## 2018-08-21 NOTE — ED Triage Notes (Signed)
Pt arrives via EMS from home with reports of the home health RN changing dressing to right chest when it began to hemorrhage. Home health RN reports saturating about 4 bath towels. EMS reports 92 palpated. Pt has hx of CVA with slurred speech. 550 cc NS given.

## 2018-08-22 ENCOUNTER — Telehealth: Payer: Self-pay | Admitting: Family Medicine

## 2018-08-22 ENCOUNTER — Other Ambulatory Visit: Payer: Self-pay | Admitting: Family Medicine

## 2018-08-22 DIAGNOSIS — I10 Essential (primary) hypertension: Secondary | ICD-10-CM

## 2018-08-22 LAB — NOVEL CORONAVIRUS, NAA (HOSP ORDER, SEND-OUT TO REF LAB; TAT 18-24 HRS): SARS-CoV-2, NAA: NOT DETECTED

## 2018-08-22 NOTE — Telephone Encounter (Signed)
Pt's wife called stated that pt needs a refill on his medications allopurinol, and amLOPipine. Please give pt's wife a call back.

## 2018-08-24 LAB — AEROBIC/ANAEROBIC CULTURE W GRAM STAIN (SURGICAL/DEEP WOUND)

## 2018-08-25 ENCOUNTER — Other Ambulatory Visit: Payer: Self-pay

## 2018-08-25 ENCOUNTER — Inpatient Hospital Stay (HOSPITAL_COMMUNITY)
Admission: EM | Admit: 2018-08-25 | Discharge: 2018-08-27 | DRG: 908 | Disposition: A | Discharge status: Home Health | Payer: Medicare Other | Attending: Family Medicine | Admitting: Family Medicine

## 2018-08-25 ENCOUNTER — Encounter (HOSPITAL_COMMUNITY): Payer: Self-pay | Admitting: Emergency Medicine

## 2018-08-25 DIAGNOSIS — Z8673 Personal history of transient ischemic attack (TIA), and cerebral infarction without residual deficits: Secondary | ICD-10-CM

## 2018-08-25 DIAGNOSIS — F3162 Bipolar disorder, current episode mixed, moderate: Secondary | ICD-10-CM | POA: Diagnosis present

## 2018-08-25 DIAGNOSIS — Z880 Allergy status to penicillin: Secondary | ICD-10-CM

## 2018-08-25 DIAGNOSIS — Y838 Other surgical procedures as the cause of abnormal reaction of the patient, or of later complication, without mention of misadventure at the time of the procedure: Secondary | ICD-10-CM | POA: Diagnosis present

## 2018-08-25 DIAGNOSIS — D649 Anemia, unspecified: Secondary | ICD-10-CM | POA: Diagnosis not present

## 2018-08-25 DIAGNOSIS — I9589 Other hypotension: Secondary | ICD-10-CM | POA: Diagnosis present

## 2018-08-25 DIAGNOSIS — D62 Acute posthemorrhagic anemia: Secondary | ICD-10-CM

## 2018-08-25 DIAGNOSIS — S21101A Unspecified open wound of right front wall of thorax without penetration into thoracic cavity, initial encounter: Secondary | ICD-10-CM

## 2018-08-25 DIAGNOSIS — K219 Gastro-esophageal reflux disease without esophagitis: Secondary | ICD-10-CM | POA: Diagnosis present

## 2018-08-25 DIAGNOSIS — F17210 Nicotine dependence, cigarettes, uncomplicated: Secondary | ICD-10-CM | POA: Diagnosis present

## 2018-08-25 DIAGNOSIS — M1A9XX0 Chronic gout, unspecified, without tophus (tophi): Secondary | ICD-10-CM | POA: Diagnosis present

## 2018-08-25 DIAGNOSIS — I5022 Chronic systolic (congestive) heart failure: Secondary | ICD-10-CM | POA: Diagnosis present

## 2018-08-25 DIAGNOSIS — I9762 Postprocedural hemorrhage of a circulatory system organ or structure following other procedure: Secondary | ICD-10-CM | POA: Diagnosis not present

## 2018-08-25 DIAGNOSIS — R58 Hemorrhage, not elsewhere classified: Secondary | ICD-10-CM | POA: Diagnosis present

## 2018-08-25 DIAGNOSIS — W19XXXA Unspecified fall, initial encounter: Secondary | ICD-10-CM | POA: Diagnosis present

## 2018-08-25 DIAGNOSIS — F10239 Alcohol dependence with withdrawal, unspecified: Secondary | ICD-10-CM | POA: Diagnosis present

## 2018-08-25 DIAGNOSIS — K5900 Constipation, unspecified: Secondary | ICD-10-CM | POA: Diagnosis not present

## 2018-08-25 DIAGNOSIS — E7800 Pure hypercholesterolemia, unspecified: Secondary | ICD-10-CM | POA: Diagnosis present

## 2018-08-25 DIAGNOSIS — Z888 Allergy status to other drugs, medicaments and biological substances status: Secondary | ICD-10-CM

## 2018-08-25 DIAGNOSIS — I2510 Atherosclerotic heart disease of native coronary artery without angina pectoris: Secondary | ICD-10-CM | POA: Diagnosis present

## 2018-08-25 DIAGNOSIS — Y92019 Unspecified place in single-family (private) house as the place of occurrence of the external cause: Secondary | ICD-10-CM

## 2018-08-25 DIAGNOSIS — S022XXA Fracture of nasal bones, initial encounter for closed fracture: Secondary | ICD-10-CM | POA: Diagnosis present

## 2018-08-25 DIAGNOSIS — Z7902 Long term (current) use of antithrombotics/antiplatelets: Secondary | ICD-10-CM

## 2018-08-25 DIAGNOSIS — Z1159 Encounter for screening for other viral diseases: Secondary | ICD-10-CM

## 2018-08-25 DIAGNOSIS — F0150 Vascular dementia without behavioral disturbance: Secondary | ICD-10-CM | POA: Diagnosis present

## 2018-08-25 DIAGNOSIS — I110 Hypertensive heart disease with heart failure: Secondary | ICD-10-CM | POA: Diagnosis present

## 2018-08-25 DIAGNOSIS — M1712 Unilateral primary osteoarthritis, left knee: Secondary | ICD-10-CM | POA: Diagnosis present

## 2018-08-25 DIAGNOSIS — L02411 Cutaneous abscess of right axilla: Secondary | ICD-10-CM | POA: Diagnosis present

## 2018-08-25 DIAGNOSIS — H547 Unspecified visual loss: Secondary | ICD-10-CM | POA: Diagnosis present

## 2018-08-25 DIAGNOSIS — Z8711 Personal history of peptic ulcer disease: Secondary | ICD-10-CM

## 2018-08-25 LAB — CBC WITH DIFFERENTIAL/PLATELET
Abs Immature Granulocytes: 0.34 10*3/uL — ABNORMAL HIGH (ref 0.00–0.07)
Basophils Absolute: 0.1 10*3/uL (ref 0.0–0.1)
Basophils Relative: 1 %
Eosinophils Absolute: 0.1 10*3/uL (ref 0.0–0.5)
Eosinophils Relative: 1 %
HCT: 22.8 % — ABNORMAL LOW (ref 39.0–52.0)
Hemoglobin: 7 g/dL — ABNORMAL LOW (ref 13.0–17.0)
Immature Granulocytes: 4 %
Lymphocytes Relative: 35 %
Lymphs Abs: 3.3 10*3/uL (ref 0.7–4.0)
MCH: 32.7 pg (ref 26.0–34.0)
MCHC: 30.7 g/dL (ref 30.0–36.0)
MCV: 106.5 fL — ABNORMAL HIGH (ref 80.0–100.0)
Monocytes Absolute: 0.7 10*3/uL (ref 0.1–1.0)
Monocytes Relative: 7 %
Neutro Abs: 5 10*3/uL (ref 1.7–7.7)
Neutrophils Relative %: 52 %
Platelets: 308 10*3/uL (ref 150–400)
RBC: 2.14 MIL/uL — ABNORMAL LOW (ref 4.22–5.81)
RDW: 13.2 % (ref 11.5–15.5)
WBC: 9.5 10*3/uL (ref 4.0–10.5)
nRBC: 0 % (ref 0.0–0.2)

## 2018-08-25 LAB — BASIC METABOLIC PANEL
Anion gap: 7 (ref 5–15)
BUN: 23 mg/dL — ABNORMAL HIGH (ref 6–20)
CO2: 20 mmol/L — ABNORMAL LOW (ref 22–32)
Calcium: 8.5 mg/dL — ABNORMAL LOW (ref 8.9–10.3)
Chloride: 113 mmol/L — ABNORMAL HIGH (ref 98–111)
Creatinine, Ser: 1.59 mg/dL — ABNORMAL HIGH (ref 0.61–1.24)
GFR calc Af Amer: 54 mL/min — ABNORMAL LOW (ref >60)
GFR calc non Af Amer: 47 mL/min — ABNORMAL LOW (ref >60)
Glucose, Bld: 140 mg/dL — ABNORMAL HIGH (ref 70–99)
Potassium: 4 mmol/L (ref 3.5–5.1)
Sodium: 140 mmol/L (ref 135–145)

## 2018-08-25 LAB — PREPARE RBC (CROSSMATCH)

## 2018-08-25 LAB — SARS CORONAVIRUS 2 BY RT PCR (HOSPITAL ORDER, PERFORMED IN ~~LOC~~ HOSPITAL LAB): SARS Coronavirus 2: NEGATIVE

## 2018-08-25 MED ORDER — ERYTHROMYCIN 5 MG/GM OP OINT
1.0000 "application " | TOPICAL_OINTMENT | Freq: Three times a day (TID) | OPHTHALMIC | Status: DC
  Administered 2018-08-25 – 2018-08-27 (×2): 1 via OPHTHALMIC
  Filled 2018-08-25: qty 3.5

## 2018-08-25 MED ORDER — ACETAMINOPHEN 650 MG RE SUPP: 650.0000 mg | Freq: Four times a day (QID) | RECTAL | Status: DC | PRN

## 2018-08-25 MED ORDER — FLUOXETINE HCL 20 MG PO CAPS
40.0000 mg | ORAL_CAPSULE | Freq: Every day | ORAL | Status: DC
  Administered 2018-08-25: 40 mg via ORAL
  Filled 2018-08-25 (×2): qty 2

## 2018-08-25 MED ORDER — ACETAMINOPHEN 325 MG PO TABS
650.0000 mg | ORAL_TABLET | Freq: Four times a day (QID) | ORAL | Status: DC | PRN
  Administered 2018-08-27: 650 mg via ORAL
  Filled 2018-08-25: qty 2

## 2018-08-25 MED ORDER — POLYETHYLENE GLYCOL 3350 17 G PO PACK: 17.0000 g | PACK | Freq: Every day | ORAL | Status: DC | PRN

## 2018-08-25 MED ORDER — FOLIC ACID 1 MG PO TABS
1.0000 mg | ORAL_TABLET | Freq: Every day | ORAL | Status: DC
  Administered 2018-08-25 – 2018-08-27 (×2): 1 mg via ORAL
  Filled 2018-08-25 (×2): qty 1

## 2018-08-25 MED ORDER — PANTOPRAZOLE SODIUM 40 MG PO TBEC
40.0000 mg | DELAYED_RELEASE_TABLET | Freq: Every day | ORAL | Status: DC
  Administered 2018-08-25 – 2018-08-27 (×2): 40 mg via ORAL
  Filled 2018-08-25 (×2): qty 1

## 2018-08-25 MED ORDER — SODIUM CHLORIDE 0.9 % IV SOLN: 10.0000 mL/h | Freq: Once | INTRAVENOUS | Status: DC

## 2018-08-25 NOTE — H&P (Addendum)
[facility] Admission History and Physical Service Pager: [phone]  Patient name: [name] [name] Medical record number: [id number] Date of birth: February 16, 1961 Age: 58 y.o. Gender: male  Primary Care Provider: Sherene Sires, DO Consultants: General Surgery Code Status: Full  Chief Complaint: Persistent wound bleeding  Assessment and Plan: [name] [name] is a 58 y.o. male presenting with persistent right axillary wound bleed and found to have hemoglobin of 7.0, down from 10.6 on 6/25. PMH is significant for Recent CVA (07/17/2018),vascular dementia,alcoholism, bipolar 1, encephalomalacia, HFrEF, HTN,depression.  Anemia 2/2 Wound Bleed  S/p I&D of Right Axillary Abscess: Patient presents with acute anemia of 7.0 (drop from 10.6 on 6/25) and mild hypotension secondary to intermittent bleeding from right axillary wound s/p I&D on 6/23 by general surgery. He has been receiving daily wound care by home nurse, however he continues to have heavy bleeding with wound changes. Was seen by ED on 6/25 with continued bleeding from wound and mild hypotension. Per chart review,  Bleeding resolved with combat gauze. Hgb at that time was 10.6 and thus and patient was discharged home in stable condition with return precautions. Patient returns today with hypotension and acute drop in Hgb to 7.0. General surgery was consulted in ED and was not able to identify any active arterial bleed. They packed the wound with quick clot with resolution of bleeding. Recommended transfusion and re-evaluation in AM with low threshold for exploration. Recommended to keep patient NPO @ midnight. They plan to see patient in AM. On exam, wound was hemostatic with gauze in place. On admission, patient was hemodynamically stable on room air with soft blood pressures in the 90's/70's and normal mentation, asymptomatic and without pain. 1U pRBC ordered in the ED. - admit to med-tele, FPTS, attending Dr.  Andria Frames - s/p 1U pRBC in ED - follow up post-transfusion H&H at 2200 - General surgery consulted, appreciate recs  - S/p quick clot with improvement in bleeding  - do not change dressing  - plan to see in AM  - NPO after midnight  - low threshold for surgery/further exploration if bleeding persists/patient becomes hemodynamically unstable - hold plavix, SCD's for VTE prophylaxis - Heart healthy diet, NPO after midnight - tylenol PRN - PT/OT eval  Hypotension  Chronic Hypertension: BP 98/78 on admission. Likely secondary to acute anemia. Home meds: Amlodipine 10mg  QD, Hydralazine 10mg  TID, Metoprolol 100mg  BID.  - hold home meds, add back as BP tolerates  - continue to monitor  CVA (07/17/18)  Vascular Dementia  CAD Home meds: Plavix 75mg  QD, Atorvastatin 80mg  QD. Unsure if he's been taking his Plavix. - hold plavix given acute bleed - Continue home atorvastatin  HFrEF: Last echo in 07/31/2018 showed an EF of 45 to 50%. Euvolemic on exam. Home meds: Isordil 20mg  TID, metoprolol 100mg  bid - hold home meds given soft BP's - read as tolerated  GERD:  Home meds: Protonix 40mg  QD - continue home meds  MDD:  Home meds: Fluoxetine 40mg  QD. Denies any SI/HI. - continue home meds  Nasal Bone Fracture: Noted to have nasal bone fracture after fall at home prior to last admission. Maxillary CT showed fractures to the nasal bone. ENT was consulted and recommended follow-up in their clinic at 1 week to assess the nasal bone fracture. Also encouraged to limit nose blowing and regular saline washes. Patient notes he has not followed up with ENT. - encourage outpatient follow up with ENT as recommended in prior admission  Alcohol  Use  Tobacco Use: Denies any alcohol use since discharge. Smokes 1PPD of [place] 100. Declines nicotine patch. - nicotine patch upon request  FEN/GI: Heart healthy diet, NPO after MN Prophylaxis: SCDs  Disposition: med-tele  History of Present Illness:   [name] [name] is a 58 y.o. male presenting with persistent intermittent bleeding from right lateral chest wound. He was hospitalized from 6/19 to 6/24 for AMS and right lateral chest abscess. He had the wound I&D's on 6/23 by general surgery. He has received continued wound care by home health nurse but continues to experience heavy bleeding with dressing changes. He denies any bleeding between dressing changes. Denies any pain. [facility]. Hgb was 10.6 at that time. They packed the wound with resolution of bleeding and patient was discharged in stable condition. Patient returns today due to continued bleeding with dressing changes and found to have hemoglobin of 7.0. He denies any chest pain, SOB, dizziness, or lightheadedness.   Review Of Systems: Per HPI with the following additions:  Review of Systems  Constitutional: Negative for chills and fever.  HENT: Negative for congestion and sore throat.   Eyes: Negative for blurred vision and double vision.  Respiratory: Negative for cough, hemoptysis, sputum production and shortness of breath.   Cardiovascular: Negative for chest pain and leg swelling.  Gastrointestinal: Negative for abdominal pain, blood in stool, constipation, diarrhea, melena, nausea and vomiting.  Genitourinary: Negative for dysuria and hematuria.  Musculoskeletal: Negative for back pain and myalgias.  Neurological: Negative for dizziness, loss of consciousness, weakness and headaches.  Psychiatric/Behavioral: Negative for suicidal ideas.   Patient Active Problem List   Diagnosis Date Noted  . Anemia due to blood loss, acute 08/25/2018  . Confusion   . Chest wall abscess   . Closed fracture of cervical vertebra (HCC)   . Facial bones, closed fracture (HCC)   . Hyponatremia   . AMS (altered mental status) 08/15/2018  . Left ventricular systolic dysfunction 08/08/2018  . Weight loss, non-intentional 07/25/2018  . Pure hypercholesterolemia 07/25/2018  .  Visual impairment 07/25/2018  . Renal mass, right 07/25/2018  . Impaired mobility and ADLs 07/25/2018  . Depression   . Frequent falls 07/17/2018  . Dementia (HCC), vascular 07/17/2018  . Cerebral infarct, left corona radiata (HCC) 07/17/2018  . Chronic pain of left knee 07/30/2017  . Chronic pain of right knee 02/13/2016  . History of stroke   . Total incontinence   . Essential hypertension   . H/O: duodenal ulcer 12/06/2014  . Abnormality of gait 07/28/2009  . Alcoholism (HCC) 07/11/2009  . Bipolar 1 disorder, mixed, moderate (HCC) 06/27/2006  . GOUT, CHRONIC 04/25/2006  . OBESITY, NOS 04/25/2006  . TOBACCO DEPENDENCE 04/25/2006  . History of traumatic brain injury 02/27/1995  . Encephalomalacia, bilateral frontal, traumatic 02/27/1995    Past Medical History: Past Medical History:  Diagnosis Date  . Alcohol abuse   . ANEMIA, FOLIC ACID DEFICIENCY 08/02/2009   Qualifier: Diagnosis of  By: Sharen HonesGutierrez  MD, Wynona CanesJavier    . Arthritis    left knee  . Bipolar disorder (HCC)   . Cerebral infarct, left corona radiata (HCC) 07/17/2018  . Dementia (HCC), vascular 07/17/2018  . Depression   . DEPRESSION, MAJOR, RECURRENT 04/25/2006   Qualifier: Diagnosis of  By: Seleta Rhymesowand MD, Loraine LericheMark    . Duodenal ulcer 12/06/2014   Qualifier: Diagnosis of  By: Seleta Rhymesowand MD, Loraine LericheMark    . Encephalomalacia, bilateral frontal, traumatic 02/27/1995   Bilateral traumatic  encephalomalacia  . Erectile dysfunction 02/21/2018  . Fall   . Gastrointestinal hemorrhage associated with duodenal ulcer   . GERD (gastroesophageal reflux disease)   . Hepatic encephalopathy (HCC), possible history of 09/03/2010  . Hepatic steatosis 06/09/2010   Diagnosed by U/S 05/12/10. Likely secondary to chronic ETOH abuse. Will continue to follow.    . History of traumatic brain injury   . Hyperkalemia 12/08/2014  . Hypertension   . Left leg weakness 07/27/2015  . Orbital floor (blow-out) closed fracture (HCC) 05/25/2017   Left orbit, stable.  No  surgical intervention required.  . Pneumonia   . Pure hypercholesterolemia 07/25/2018  . Stroke [facility](HCC)    a few spanning from 1997 to last year  . Tobacco dependence     Past Surgical History: Past Surgical History:  Procedure Laterality Date  . COLONOSCOPY    . ESOPHAGOGASTRODUODENOSCOPY (EGD) WITH PROPOFOL Left 12/09/2014   Procedure: ESOPHAGOGASTRODUODENOSCOPY (EGD) WITH PROPOFOL;  Surgeon: Bernette Redbirdobert Buccini, MD;  [facility];  Service: Endoscopy;  Laterality: Left;  . EVISCERATION Right 11/06/2017   Procedure: EVISCERATION REPAIR RIGHT EYE;  Surgeon: Carmela RimaPatel, Narendra, MD;  [facility];  Service: Ophthalmology;  Laterality: Right;  . EYE SURGERY     eye lid was split  . IRRIGATION AND DEBRIDEMENT ABSCESS N/A 08/19/2018   Procedure: INCISION AND DRAINAGE OF CHEST WALL ABSCESS;  Surgeon: Harriette Bouillonornett, Thomas, MD;  Location: [facility] OR;  Service: General;  Laterality: N/A;    Social History: Social History   Tobacco Use  . Smoking status: Current Every Day Smoker    Packs/day: 0.50    Years: 30.00    Pack years: 15.00    Types: Cigarettes  . Smokeless tobacco: Never Used  . Tobacco comment: does not want to quitt right now  Substance Use Topics  . Alcohol use: Yes    Alcohol/week: 14.0 standard drinks    Types: 14 Cans of beer per week    Comment: " every friday I get a 5th "  . Drug use: No   Additional social history: Smokes 1PPD x years, denies any recent drinking, lives with wife Please also refer to relevant sections of EMR.  Family History: No family history on file.  Allergies and Medications: Allergies  Allergen Reactions  . Ace Inhibitors Anaphylaxis and Swelling    Tongue and face became swollen  . Penicillins Rash    From childhood: Has patient had a PCN reaction causing immediate rash, facial/tongue/throat swelling, SOB or lightheadedness with hypotension: Yes Has patient had a PCN reaction causing severe rash involving mucus membranes or skin necrosis:  Unk Has patient had a PCN reaction that required hospitalization: Unk Has patient had a PCN reaction occurring within the last 10 years: No If all of the above answers are "NO", then may proceed with Cephalosporin use.    No current facility-administered medications on file prior to encounter.    Current Outpatient Medications on File Prior to Encounter  Medication Sig Dispense Refill  . acetaminophen (TYLENOL) 325 MG tablet Take 650 mg by mouth every 6 (six) hours as needed for mild pain.    Marland Kitchen. allopurinol (ZYLOPRIM) 300 MG tablet TAKE 1 TABLET BY MOUTH EVERY DAY (Patient taking differently: Take 300 mg by mouth daily. ) 30 tablet 11  . amLODipine (NORVASC) 10 MG tablet TAKE 1 TABLET BY MOUTH EVERY DAY (Patient taking differently: Take 10 mg by mouth daily. ) 90 tablet 3  . atorvastatin (LIPITOR) 80 MG tablet Take 1 tablet (80  mg total) by mouth daily. 30 tablet 11  . clopidogrel (PLAVIX) 75 MG tablet Take 1 tablet (75 mg total) by mouth daily. 90 tablet 3  . diclofenac sodium (VOLTAREN) 1 % GEL Apply 2 g topically 4 (four) times daily. 1 Tube 3  . erythromycin ophthalmic ointment Place 1 application into the right eye 3 (three) times daily.    Marland Kitchen FLUoxetine (PROZAC) 40 MG capsule Take 40 mg by mouth at bedtime.    . folic acid (FOLVITE) 1 MG tablet TAKE 1 TABLET (1 MG TOTAL) BY MOUTH DAILY. (Patient taking differently: Take 1 mg by mouth daily. ) 30 tablet 11  . hydrALAZINE (APRESOLINE) 10 MG tablet Take 1 tablet (10 mg total) by mouth 3 (three) times daily. 90 tablet 5  . isosorbide dinitrate (ISORDIL) 20 MG tablet Take 1 tablet (20 mg total) by mouth 3 (three) times daily. 90 tablet 5  . lactulose (CHRONULAC) 10 GM/15ML solution Take 45 mLs (30 g total) by mouth daily as needed for mild constipation. 300 mL 3  . metoprolol tartrate (LOPRESSOR) 100 MG tablet TAKE 1 TABLET BY MOUTH TWICE A DAY (Patient taking differently: Take 100 mg by mouth 2 (two) times daily. ) 180 tablet 1  . NICOTINE  STEP 1 21 MG/24HR patch PLACE 1 PATCH ONTO THE SKIN DAILY. (Patient taking differently: Place 21 mg onto the skin daily. ) 28 patch 2  . omega-3 acid ethyl esters (LOVAZA) 1 g capsule TAKE 1 CAPSULE (1 G TOTAL) BY MOUTH DAILY. (Patient taking differently: Take 1 g by mouth daily. TAKE 1 CAPSULE (1 G TOTAL) BY MOUTH DAILY.) 30 capsule 11  . oxyCODONE-acetaminophen (PERCOCET/ROXICET) 5-325 MG tablet Take 1 tablet by mouth every 4 (four) hours as needed for moderate pain. 8 tablet 0  . pantoprazole (PROTONIX) 40 MG tablet Take 1 tablet (40 mg total) by mouth daily. 90 tablet 3  . doxycycline (VIBRA-TABS) 100 MG tablet Take 1 tablet (100 mg total) by mouth every 12 (twelve) hours. 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 See media for picture of wound Extremities: warm and well perfused, normal tone Neuro: Alert and oriented x2 to person and place, speech somewhat slurred (baseline since CVA in May)  Labs and Imaging: CBC BMET  Recent Labs  Lab 08/25/18 1512  WBC 9.5  HGB 7.0*  HCT 22.8*  PLT 308   Recent Labs  Lab 08/25/18 1512  NA 140  K 4.0  CL 113*  CO2 20*  BUN 23*  CREATININE  1.59*  GLUCOSE 140*  CALCIUM 8.5*     COVID: Negative  Joana Reamer, DO 08/25/2018, 5:49 PM PGY-1, [facility]  FPTS Intern pager: [phone], text pages welcome   FPTS Upper-Level Resident Addendum   I have independently interviewed and examined the patient. I have discussed the above with the original author and agree with their documentation. My edits for correction/addition/clarification are in blue. Please see also any attending notes.   Oralia ManisSherin Thompson Mckim, DO PGY-2, [facility] 08/25/2018 6:19 PM  FPTS Service pager: [phone] (text pages welcome through [facility])

## 2018-08-25 NOTE — ED Provider Notes (Signed)
[facility] EMERGENCY DEPARTMENT Provider Note   CSN: [id number] Arrival date & time: 08/25/18  1429    History   Chief Complaint Chief Complaint  Patient presents with  . bleeding wound    HPI ISADOR CASTILLE is a 58 y.o. male.     Patient had some bleeding from chest wall abscess site again today. Wound was packed by EMS, bleeding has now stopped.   The history is provided by the patient.  Wound Check This is a chronic problem. The current episode started 3 to 5 hours ago. The problem occurs constantly. The problem has been resolved. Pertinent negatives include no chest pain, no abdominal pain, no headaches and no shortness of breath. Nothing aggravates the symptoms. Nothing relieves the symptoms. He has tried nothing for the symptoms. The treatment provided no relief.    Past Medical History:  Diagnosis Date  . Alcohol abuse   . ANEMIA, FOLIC ACID DEFICIENCY 06/27/7614   Qualifier: Diagnosis of  By: Danise Mina  MD, Garlon Hatchet    . Arthritis    left knee  . Bipolar disorder ([place])   . Cerebral infarct, left corona radiata ([place]) 07/17/2018  . Dementia ([place]), vascular 07/17/2018  . Depression   . DEPRESSION, MAJOR, RECURRENT 04/25/2006   Qualifier: Diagnosis of  By: Darylene Price MD, Elta Guadeloupe    . Duodenal ulcer 12/06/2014   Qualifier: Diagnosis of  By: Darylene Price MD, Elta Guadeloupe    . 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 Erectile dysfunction 02/21/2018  . Fall   . Gastrointestinal hemorrhage associated with duodenal ulcer   . GERD (gastroesophageal reflux disease)   . Hepatic encephalopathy ([place]), possible history of 09/03/2010  . Hepatic steatosis 06/09/2010   Diagnosed by U/S 05/12/10. Likely secondary to chronic ETOH abuse. Will continue to follow.    . History of traumatic brain injury   . Hyperkalemia 12/08/2014  . Hypertension   . Left leg weakness 07/27/2015  . Orbital floor (blow-out) closed fracture ([place])  05/25/2017   Left orbit, stable.  No surgical intervention required.  . Pneumonia   . Pure hypercholesterolemia 07/25/2018  . Stroke [facility])    a few spanning from 1997 to last year  . Tobacco dependence     Patient Active Problem List   Diagnosis Date Noted  . Confusion   . Chest wall abscess   . Closed fracture of cervical vertebra ([place])   . Facial bones, closed fracture ([place])   . Hyponatremia   . AMS (altered mental status) 08/15/2018  . Left ventricular systolic dysfunction 07/37/1062  . Weight loss, non-intentional 07/25/2018  . Pure hypercholesterolemia 07/25/2018  . Visual impairment 07/25/2018  . Renal mass, right 07/25/2018  . Impaired mobility and ADLs 07/25/2018  . Depression   . Frequent falls 07/17/2018  . Dementia ([place]), vascular 07/17/2018  . Cerebral infarct, left corona radiata ([place]) 07/17/2018  . Chronic pain of left knee 07/30/2017  . Chronic pain of right knee 02/13/2016  . History of stroke   . Total incontinence   . Essential hypertension   . H/O: duodenal ulcer 12/06/2014  . Abnormality of gait 07/28/2009  . Alcoholism ([place]) 07/11/2009  . Bipolar 1 disorder, mixed, moderate ([place]) 06/27/2006  . GOUT, CHRONIC 04/25/2006  . OBESITY, NOS 04/25/2006  . TOBACCO DEPENDENCE 04/25/2006  . History of traumatic brain injury 02/27/1995  . Encephalomalacia, bilateral frontal, traumatic 02/27/1995    Past Surgical History:  Procedure Laterality Date  . COLONOSCOPY    .  ESOPHAGOGASTRODUODENOSCOPY (EGD) WITH PROPOFOL Left 12/09/2014   Procedure: ESOPHAGOGASTRODUODENOSCOPY (EGD) WITH PROPOFOL;  Surgeon: Bernette Redbirdobert Buccini, MD;  [facility];  Service: Endoscopy;  Laterality: Left;  . EVISCERATION Right 11/06/2017   Procedure: EVISCERATION REPAIR RIGHT EYE;  Surgeon: Carmela RimaPatel, Narendra, MD;  [facility];  Service: Ophthalmology;  Laterality: Right;  . EYE SURGERY     eye lid was split  . IRRIGATION AND DEBRIDEMENT ABSCESS N/A 08/19/2018   Procedure:  INCISION AND DRAINAGE OF CHEST WALL ABSCESS;  Surgeon: Harriette Bouillonornett, Thomas, MD;  Location: [facility] OR;  Service: General;  Laterality: N/A;        Home Medications    Prior to Admission medications   Medication Sig Start Date End Date Taking? Authorizing Provider  allopurinol (ZYLOPRIM) 300 MG tablet TAKE 1 TABLET BY MOUTH EVERY DAY 08/22/18   Marthenia RollingBland, Scott, DO  amLODipine (NORVASC) 10 MG tablet TAKE 1 TABLET BY MOUTH EVERY DAY 08/22/18   Marthenia RollingBland, Scott, DO  atorvastatin (LIPITOR) 80 MG tablet Take 1 tablet (80 mg total) by mouth daily. 07/25/18   McDiarmid, Leighton Roachodd D, MD  clopidogrel (PLAVIX) 75 MG tablet Take 1 tablet (75 mg total) by mouth daily. 07/25/18   McDiarmid, Leighton Roachodd D, MD  diclofenac sodium (VOLTAREN) 1 % GEL Apply 2 g topically 4 (four) times daily. 07/25/18   McDiarmid, Leighton Roachodd D, MD  doxycycline (VIBRA-TABS) 100 MG tablet Take 1 tablet (100 mg total) by mouth every 12 (twelve) hours. 08/20/18   Mirian MoFrank, Peter, MD  erythromycin ophthalmic ointment Place 1 application into the right eye 3 (three) times daily. 08/12/18   [provider]  FLUoxetine (PROZAC) 40 MG capsule Take 40 mg by mouth at bedtime.    [provider]  folic acid (FOLVITE) 1 MG tablet TAKE 1 TABLET (1 MG TOTAL) BY MOUTH DAILY. 02/24/18   Marthenia RollingBland, Scott, DO  hydrALAZINE (APRESOLINE) 10 MG tablet Take 1 tablet (10 mg total) by mouth 3 (three) times daily. 08/08/18   McDiarmid, Leighton Roachodd D, MD  isosorbide dinitrate (ISORDIL) 20 MG tablet Take 1 tablet (20 mg total) by mouth 3 (three) times daily. 08/08/18   McDiarmid, Leighton Roachodd D, MD  lactulose (CHRONULAC) 10 GM/15ML solution Take 45 mLs (30 g total) by mouth daily as needed for mild constipation. 02/24/18   Marthenia RollingBland, Scott, DO  metoprolol tartrate (LOPRESSOR) 100 MG tablet TAKE 1 TABLET BY MOUTH TWICE A DAY Patient taking differently: Take 100 mg by mouth 2 (two) times daily.  07/04/18   Ellwood Denseumball, Alison, DO  NICOTINE STEP 1 21 MG/24HR patch PLACE 1 PATCH ONTO THE SKIN DAILY. Patient not  taking: No sig reported 05/09/17   Arvilla MarketWallace, Catherine Lauren, DO  omega-3 acid ethyl esters (LOVAZA) 1 g capsule TAKE 1 CAPSULE (1 G TOTAL) BY MOUTH DAILY. Patient taking differently: Take 1 g by mouth daily. TAKE 1 CAPSULE (1 G TOTAL) BY MOUTH DAILY. 02/24/18   Marthenia RollingBland, Scott, DO  oxyCODONE-acetaminophen (PERCOCET/ROXICET) 5-325 MG tablet Take 1 tablet by mouth every 4 (four) hours as needed for moderate pain. 08/20/18   Mirian MoFrank, Peter, MD  pantoprazole (PROTONIX) 40 MG tablet Take 1 tablet (40 mg total) by mouth daily. 10/21/17   Marthenia RollingBland, Scott, DO    Family History No family history on file.  Social History Social History   Tobacco Use  . Smoking status: Current Every Day Smoker    Packs/day: 0.50    Years: 30.00    Pack years: 15.00    Types: Cigarettes  . Smokeless tobacco: Never Used  .  Tobacco comment: does not want to quitt right now  Substance Use Topics  . Alcohol use: Yes    Alcohol/week: 14.0 standard drinks    Types: 14 Cans of beer per week    Comment: " every friday I get a 5th "  . Drug use: No     Allergies   Ace inhibitors and Penicillins   Review of Systems Review of Systems  Constitutional: Negative for chills and fever.  HENT: Negative for ear pain and sore throat.   Eyes: Negative for pain and visual disturbance.  Respiratory: Negative for cough and shortness of breath.   Cardiovascular: Negative for chest pain and palpitations.  Gastrointestinal: Negative for abdominal pain and vomiting.  Genitourinary: Negative for dysuria and hematuria.  Musculoskeletal: Negative for arthralgias and back pain.  Skin: Positive for wound. Negative for color change and rash.  Neurological: Negative for seizures, syncope and headaches.  All other systems reviewed and are negative.    Physical Exam Updated Vital Signs  ED Triage Vitals [08/25/18 1435]  Enc Vitals Group     BP      Pulse      Resp      Temp      Temp src      SpO2      Weight      Height       Head Circumference      Peak Flow      Pain Score 0     Pain Loc      Pain Edu?      Excl. in GC?     Physical Exam Vitals signs and nursing note reviewed.  Constitutional:      Appearance: He is well-developed.  HENT:     Head: Normocephalic and atraumatic.     Mouth/Throat:     Mouth: Mucous membranes are moist.  Eyes:     Extraocular Movements: Extraocular movements intact.     Conjunctiva/sclera: Conjunctivae normal.     Pupils: Pupils are equal, round, and reactive to light.  Neck:     Musculoskeletal: Neck supple.  Cardiovascular:     Rate and Rhythm: Normal rate and regular rhythm.     Pulses: Normal pulses.     Heart sounds: Normal heart sounds. No murmur.  Pulmonary:     Effort: Pulmonary effort is normal. No respiratory distress.     Breath sounds: Normal breath sounds.  Abdominal:     Palpations: Abdomen is soft.     Tenderness: There is no abdominal tenderness.  Skin:    General: Skin is warm and dry.     Comments: Right sided chest wall wound hemostatic with gauze in place, no purulent drainage, margins of wound without infection   Neurological:     Mental Status: He is alert.      ED Treatments / Results  Labs (all labs ordered are listed, but only abnormal results are displayed) Labs Reviewed  CBC WITH DIFFERENTIAL/PLATELET - Abnormal; Notable for the following components:      Result Value   RBC 2.14 (*)    Hemoglobin 7.0 (*)    HCT 22.8 (*)    MCV 106.5 (*)    Abs Immature Granulocytes 0.34 (*)    All other components within normal limits  BASIC METABOLIC PANEL - Abnormal; Notable for the following components:   Chloride 113 (*)    CO2 20 (*)    Glucose, Bld 140 (*)    BUN 23 (*)  Creatinine, Ser 1.59 (*)    Calcium 8.5 (*)    GFR calc non Af Amer 47 (*)    GFR calc Af Amer 54 (*)    All other components within normal limits  SARS CORONAVIRUS 2 (HOSPITAL ORDER, PERFORMED IN [facility])  TYPE AND SCREEN  PREPARE RBC  (CROSSMATCH)    EKG EKG Interpretation  Date/Time:  Monday August 25 2018 14:30:22 EDT Ventricular Rate:  74 PR Interval:    QRS Duration: 111 QT Interval:  394 QTC Calculation: 438 R Axis:   -12 Text Interpretation:  Sinus rhythm Abnormal R-wave progression, early transition Confirmed by Virgina NorfolkAdam, Allyse Fregeau [phone]) on 08/25/2018 3:15:55 PM   Radiology No results found.  Procedures .Critical Care Performed by: Virgina Norfolkuratolo, Shalena Ezzell, DO Authorized by: Virgina Norfolkuratolo, Christino Mcglinchey, DO   Critical care provider statement:    Critical care time (minutes):  35   Critical care was necessary to treat or prevent imminent or life-threatening deterioration of the following conditions:  Circulatory failure   Critical care was time spent personally by me on the following activities:  Blood draw for specimens, development of treatment plan with patient or surrogate, discussions with consultants, discussions with primary provider, examination of patient, obtaining history from patient or surrogate, ordering and performing treatments and interventions, ordering and review of laboratory studies, pulse oximetry, re-evaluation of patient's condition and ordering and review of radiographic studies   I assumed direction of critical care for this patient from another provider in my specialty: no     (including critical care time)  Medications Ordered in ED Medications  0.9 %  sodium chloride infusion (has no administration in time range)     Initial Impression / Assessment and Plan / ED Course  I have reviewed the triage vital signs and the nursing notes.  Pertinent labs & imaging results that were available during my care of the patient were reviewed by me and considered in my medical decision making (see chart for details).     Jacobo Forestnthony L Tewell is a 58 year old male with history of bipolar, depression, stroke who presents to the ED for bleeding from his right chest wall abscess.  Similar issue several days ago.  Patient  had an I&D performed by general surgery several days ago.  Has had packing done by home care nurse.  Had extensive bleeding after wound was checked today.  Hemoglobin dropped from 11 to 7 today.  Wound has been packed and is now hemostatic.  Was evaluated by Dr. Dwain SarnaWakefield with surgery.  Will transfuse 1 unit of packed red blood cells given drop in hemoglobin and mild hypotension.  Patient with normal mentation.  Patient to be admitted to medicine and surgery will reevaluate wound in the morning.  No need for surgical intervention at this time.  Recommend blood transfusion and trending of hemoglobin.  Admitted in stable condition.  Patient not on blood thinner.  This chart was dictated using voice recognition software.  Despite best efforts to proofread,  errors can occur which can change the documentation meaning.    Final Clinical Impressions(s) / ED Diagnoses   Final diagnoses:  Symptomatic anemia  Hemorrhage from open wound of right chest wall, initial encounter    ED Discharge Orders    None       Virgina NorfolkCuratolo, Ndea Kilroy, DO 08/25/18 1636

## 2018-08-25 NOTE — Progress Notes (Signed)
[facility]/Trauma Progress Note      Assessment/Plan Hx CVA-Plavix 08/14/2018 AMS/fall Alcohol abuse/alcohol withdrawal Old C5-C6 fractures Tobacco use Hx of bipolar disorder/Hx depression GERD/history of duodenal ulcers  R chest wall abscess s/p I&D of Right chest wall abscess status post fall, Dr. Brantley Stage, 06/23 - pt returns to ED with complaints of bleeding from wound - pt dropped H&H from 10.6/34.2 06/25 to 7.0/22.8 today - recommend admission to medicine, 1 UpRBC's - no active bleeding on exam, packed with quick clot, do not change dressing, we will change tomorrow and reexam wound to see if it needs to go to the ER to control this - we will see in am, NPO at midnight    LOS: 0 days    Subjective: CC: bleeding from R chest wall wound  Pt wife at bedside. He was in the ER on 06/25 with complaints of bleeding and sent home. She states the [facility] nurse changed the dressing on Friday and again today and the nurse called EMS cause she could not control the bleeding. Pt did not speak.  Wife stopped giving him his blood thinner.  Objective: Vital signs in last 24 hours: Pulse Rate:  [72-77] 72 (06/29 1600) Resp:  [16-17] 16 (06/29 1600) BP: (92-98)/(67-79) 98/78 (06/29 1600) SpO2:  [100 %] 100 % (06/29 1600) Weight:  [95.2 kg] 95.2 kg (06/29 1600)    Intake/Output from previous day: No intake/output data recorded. Intake/Output this shift: No intake/output data recorded.  PE: Gen:  Alert, NAD, pale Cardio: RRR Pulm:  Rate and effort normal Chest wall: wound is clean without purulent drainage or active bleeding. See photo below. Skin: warm and dry, pale      Anti-infectives: Anti-infectives (From admission, onward)   None      Lab Results:  Recent Labs    08/25/18 1512  WBC 9.5  HGB 7.0*  HCT 22.8*  PLT 308   BMET Recent Labs    08/25/18 1512  NA 140  K 4.0  CL 113*  CO2 20*  GLUCOSE 140*  BUN 23*  CREATININE 1.59*  CALCIUM  8.5*   PT/INR No results for input(s): LABPROT, INR in the last 72 hours. CMP     Component Value Date/Time   NA 140 08/25/2018 1512   NA 140 05/28/2017 1617   K 4.0 08/25/2018 1512   CL 113 (H) 08/25/2018 1512   CO2 20 (L) 08/25/2018 1512   GLUCOSE 140 (H) 08/25/2018 1512   BUN 23 (H) 08/25/2018 1512   BUN 10 05/28/2017 1617   CREATININE 1.59 (H) 08/25/2018 1512   CREATININE 1.11 08/12/2015 1417   CALCIUM 8.5 (L) 08/25/2018 1512   PROT 6.4 (L) 08/21/2018 1539   PROT 7.6 05/28/2017 1617   ALBUMIN 2.4 (L) 08/21/2018 1539   ALBUMIN 4.3 05/28/2017 1617   AST 47 (H) 08/21/2018 1539   ALT 67 (H) 08/21/2018 1539   ALKPHOS 71 08/21/2018 1539   BILITOT 0.6 08/21/2018 1539   BILITOT 0.5 05/28/2017 1617   GFRNONAA 47 (L) 08/25/2018 1512   GFRNONAA 74 08/12/2015 1417   GFRAA 54 (L) 08/25/2018 1512   GFRAA 85 08/12/2015 1417   Lipase     Component Value Date/Time   LIPASE 41 12/07/2014 2004    Studies/Results: No results found.    Kalman Drape , [facility] 08/25/2018, 4:31 PM  Pager: [phone] Mon-Wed, Friday 7:00am-4:30pm Thurs 7am-11:30am  Consults: [phone]

## 2018-08-25 NOTE — ED Notes (Signed)
ED TO INPATIENT HANDOFF REPORT  ED Nurse Name and Phone #: Florentina AddisonKatie [phone]  S Name/Age/Gender Jacobo ForestAnthony L Solberg 58 y.o. male Room/Bed: 024C/024C  Code Status   Code Status: Full Code  Home/SNF/Other Home Patient oriented to: self, place, time and situation Is this baseline? Yes   Triage Complete: Triage complete  Chief Complaint Bleeding wound  Triage Note Pt arrives via EMS with reports from the home health RN that the wound on the right chest/axillary side is bleeding and is unable to control the bleeding. EMS reports they have been able to somewhat control it now with a trauma bandage. EMS gave pt 500 cc bolus. Pt denies LOC, Dizziness.    Allergies Allergies  Allergen Reactions  . Ace Inhibitors Anaphylaxis and Swelling    Tongue and face became swollen  . Penicillins Rash    From childhood: Has patient had a PCN reaction causing immediate rash, facial/tongue/throat swelling, SOB or lightheadedness with hypotension: Yes Has patient had a PCN reaction causing severe rash involving mucus membranes or skin necrosis: Unk Has patient had a PCN reaction that required hospitalization: Unk Has patient had a PCN reaction occurring within the last 10 years: No If all of the above answers are "NO", then may proceed with Cephalosporin use.     Level of Care/Admitting Diagnosis ED Disposition    ED Disposition Condition Comment   Admit  [facility] [[id number]]  Level of Care: Telemetry Medical [104]  Covid Evaluation: Screening Protocol (No Symptoms)  Diagnosis: Anemia due to blood loss, acute [690810]  Admitting Physician: Joana ReamerMULLIS, KIERSTEN P [[id number]]  Attending Physician: Doralee AlbinoHENSEL, WILLIAM A [[id number]]  PT Class (Do Not Modify): Observation [104]  PT Acc Code (Do Not Modify): Observation [10022]       B Medical/Surgery History Past Medical History:  Diagnosis Date  . Alcohol abuse   . ANEMIA, FOLIC ACID DEFICIENCY 08/02/2009   Qualifier:  Diagnosis of  By: Sharen HonesGutierrez  MD, Wynona CanesJavier    . Arthritis    left knee  . Bipolar disorder (HCC)   . Cerebral infarct, left corona radiata (HCC) 07/17/2018  . Dementia (HCC), vascular 07/17/2018  . Depression   . DEPRESSION, MAJOR, RECURRENT 04/25/2006   Qualifier: Diagnosis of  By: Seleta Rhymesowand MD, Loraine LericheMark    . Duodenal ulcer 12/06/2014   Qualifier: Diagnosis of  By: Seleta Rhymesowand MD, Loraine LericheMark    . 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 Erectile dysfunction 02/21/2018  . Fall   . Gastrointestinal hemorrhage associated with duodenal ulcer   . GERD (gastroesophageal reflux disease)   . Hepatic encephalopathy (HCC), possible history of 09/03/2010  . Hepatic steatosis 06/09/2010   Diagnosed by U/S 05/12/10. Likely secondary to chronic ETOH abuse. Will continue to follow.    . History of traumatic brain injury   . Hyperkalemia 12/08/2014  . Hypertension   . Left leg weakness 07/27/2015  . Orbital floor (blow-out) closed fracture (HCC) 05/25/2017   Left orbit, stable.  No surgical intervention required.  . Pneumonia   . Pure hypercholesterolemia 07/25/2018  . Stroke [facility](HCC)    a few spanning from 1997 to last year  . Tobacco dependence    Past Surgical History:  Procedure Laterality Date  . COLONOSCOPY    . ESOPHAGOGASTRODUODENOSCOPY (EGD) WITH PROPOFOL Left 12/09/2014   Procedure: ESOPHAGOGASTRODUODENOSCOPY (EGD) WITH PROPOFOL;  Surgeon: Bernette Redbirdobert Buccini, MD;  [facility];  Service: Endoscopy;  Laterality: Left;  . EVISCERATION Right 11/06/2017   Procedure:  EVISCERATION REPAIR RIGHT EYE;  Surgeon: Jalene Mullet, MD;  Location: [place];  Service: Ophthalmology;  Laterality: Right;  . EYE SURGERY     eye lid was split  . IRRIGATION AND DEBRIDEMENT ABSCESS N/A 08/19/2018   Procedure: INCISION AND DRAINAGE OF CHEST WALL ABSCESS;  Surgeon: Erroll Luna, MD;  Location: [place];  Service: General;  Laterality: N/A;     A IV  Location/Drains/Wounds Patient Lines/Drains/Airways Status   Active Line/Drains/Airways    Name:   Placement date:   Placement time:   Site:   Days:   Peripheral IV 08/25/18 Right Forearm   08/25/18    1441    Forearm   less than 1   External Urinary Catheter   08/16/18    0900    -   9   Airway 7 mm   08/21/18    1703     4   Incision (Closed) 11/06/17 Eye   11/06/17    1323     292   Incision (Closed) 08/19/18 Chest Right   08/19/18    1355     6          Intake/Output Last 24 hours No intake or output data in the 24 hours ending 08/25/18 1732  Labs/Imaging Results for orders placed or performed during the hospital encounter of 08/25/18 (from the past 48 hour(s))  CBC with Differential     Status: Abnormal   Collection Time: 08/25/18  3:12 PM  Result Value Ref Range   WBC 9.5 4.0 - 10.5 K/uL   RBC 2.14 (L) 4.22 - 5.81 MIL/uL   Hemoglobin 7.0 (L) 13.0 - 17.0 g/dL   HCT 22.8 (L) 39.0 - 52.0 %   MCV 106.5 (H) 80.0 - 100.0 fL   MCH 32.7 26.0 - 34.0 pg   MCHC 30.7 30.0 - 36.0 g/dL   RDW 13.2 11.5 - 15.5 %   Platelets 308 150 - 400 K/uL   nRBC 0.0 0.0 - 0.2 %   Neutrophils Relative % 52 %   Neutro Abs 5.0 1.7 - 7.7 K/uL   Lymphocytes Relative 35 %   Lymphs Abs 3.3 0.7 - 4.0 K/uL   Monocytes Relative 7 %   Monocytes Absolute 0.7 0.1 - 1.0 K/uL   Eosinophils Relative 1 %   Eosinophils Absolute 0.1 0.0 - 0.5 K/uL   Basophils Relative 1 %   Basophils Absolute 0.1 0.0 - 0.1 K/uL   Immature Granulocytes 4 %   Abs Immature Granulocytes 0.34 (H) 0.00 - 0.07 K/uL    Comment: Performed at [facility], [address], [place], [place] [address]  Basic metabolic panel     Status: Abnormal   Collection Time: 08/25/18  3:12 PM  Result Value Ref Range   Sodium 140 135 - 145 mmol/L   Potassium 4.0 3.5 - 5.1 mmol/L   Chloride 113 (H) 98 - 111 mmol/L   CO2 20 (L) 22 - 32 mmol/L   Glucose, Bld 140 (H) 70 - 99 mg/dL   BUN 23 (H) 6 - 20 mg/dL   Creatinine, Ser 1.59 (H) 0.61 - 1.24  mg/dL   Calcium 8.5 (L) 8.9 - 10.3 mg/dL   GFR calc non Af Amer 47 (L) >60 mL/min   GFR calc Af Amer 54 (L) >60 mL/min   Anion gap 7 5 - 15    Comment: Performed at [facility] [address] [place], [place] [address]  Type and screen  [facility]     Status: None (Preliminary result)   Collection Time: 08/25/18  4:04 PM  Result Value Ref Range   ABO/RH(D) O POS    Antibody Screen NEG    Sample Expiration      08/28/2018,2359 Performed at [facility], [address] [place], [place] [address]    Unit Number [id number]    Blood Component Type RED CELLS,LR    Unit division 00    Status of Unit ALLOCATED    Transfusion Status OK TO TRANSFUSE    Crossmatch Result Compatible   Prepare RBC     Status: None   Collection Time: 08/25/18  4:30 PM  Result Value Ref Range   Order Confirmation      ORDER PROCESSED BY BLOOD BANK Performed at [facility], [address] [place], [place] [address]    No results found.  Pending Labs Unresulted Labs (From admission, onward)    Start     Ordered   08/26/18 0500  Basic metabolic panel  Tomorrow morning,   R     08/25/18 1720   08/26/18 0500  CBC  Tomorrow morning,   R     08/25/18 1720   08/26/18 0500  Protime-INR  Tomorrow morning,   R     08/25/18 1720   08/25/18 2200  Hematocrit  Once,   STAT    Comments: 2 hours Post transfusion    08/25/18 1720   08/25/18 1719  Hemoglobin  Once,   STAT     08/25/18 1720   08/25/18 1629  SARS Coronavirus 2 (CEPHEID - Performed in [facility]), [facility] Order  Once,   STAT    Question:  Rule Out  Answer:  Yes   08/25/18 1628          Vitals/Pain Today's Vitals   08/25/18 1500 08/25/18 1530 08/25/18 1600 08/25/18 1711  BP: 97/79 92/67 98/78  104/73  Pulse: 73 77 72 69  Resp: 17 17 16 19   Temp:   (!) 97.5 F (36.4 C)   TempSrc:   Oral   SpO2: 100% 100% 100% 99%  Weight:   95.2 kg   Height:   5\' 8"  (1.727 m)   PainSc:         Isolation Precautions No active isolations  Medications Medications  acetaminophen (TYLENOL) tablet 650 mg (has no administration in time range)    Or  acetaminophen (TYLENOL) suppository 650 mg (has no administration in time range)  polyethylene glycol (MIRALAX / GLYCOLAX) packet 17 g (has no administration in time range)    Mobility Wife states patient is not steady on feet, frequent falls High fall risk    R Recommendations: See Admitting Provider Note  Report given to:   Additional Notes:

## 2018-08-25 NOTE — ED Triage Notes (Signed)
Pt arrives via EMS with reports from the home health RN that the wound on the right chest/axillary side is bleeding and is unable to control the bleeding. EMS reports they have been able to somewhat control it now with a trauma bandage. EMS gave pt 500 cc bolus. Pt denies LOC, Dizziness.

## 2018-08-25 NOTE — ED Notes (Signed)
Pt wife [name] [name] [phone] Daughter [name] [name] [phone]

## 2018-08-26 ENCOUNTER — Encounter (HOSPITAL_COMMUNITY)
Admission: EM | Disposition: A | Discharge status: Home Health | Payer: Self-pay | Source: Home / Self Care | Attending: Family Medicine

## 2018-08-26 ENCOUNTER — Encounter (HOSPITAL_COMMUNITY): Payer: Self-pay | Admitting: Orthopedic Surgery

## 2018-08-26 ENCOUNTER — Observation Stay (HOSPITAL_COMMUNITY): Payer: Medicare Other | Admitting: Certified Registered"

## 2018-08-26 ENCOUNTER — Ambulatory Visit (HOSPITAL_COMMUNITY): Payer: BC Managed Care – PPO

## 2018-08-26 DIAGNOSIS — I9762 Postprocedural hemorrhage of a circulatory system organ or structure following other procedure: Secondary | ICD-10-CM | POA: Diagnosis present

## 2018-08-26 DIAGNOSIS — M1A9XX0 Chronic gout, unspecified, without tophus (tophi): Secondary | ICD-10-CM | POA: Diagnosis present

## 2018-08-26 DIAGNOSIS — S21101A Unspecified open wound of right front wall of thorax without penetration into thoracic cavity, initial encounter: Secondary | ICD-10-CM

## 2018-08-26 DIAGNOSIS — F0150 Vascular dementia without behavioral disturbance: Secondary | ICD-10-CM | POA: Diagnosis present

## 2018-08-26 DIAGNOSIS — Z1159 Encounter for screening for other viral diseases: Secondary | ICD-10-CM | POA: Diagnosis not present

## 2018-08-26 DIAGNOSIS — I2510 Atherosclerotic heart disease of native coronary artery without angina pectoris: Secondary | ICD-10-CM | POA: Diagnosis present

## 2018-08-26 DIAGNOSIS — L02411 Cutaneous abscess of right axilla: Secondary | ICD-10-CM | POA: Diagnosis present

## 2018-08-26 DIAGNOSIS — R58 Hemorrhage, not elsewhere classified: Secondary | ICD-10-CM | POA: Diagnosis present

## 2018-08-26 DIAGNOSIS — S022XXA Fracture of nasal bones, initial encounter for closed fracture: Secondary | ICD-10-CM | POA: Diagnosis present

## 2018-08-26 DIAGNOSIS — Z8673 Personal history of transient ischemic attack (TIA), and cerebral infarction without residual deficits: Secondary | ICD-10-CM | POA: Diagnosis not present

## 2018-08-26 DIAGNOSIS — Z7902 Long term (current) use of antithrombotics/antiplatelets: Secondary | ICD-10-CM | POA: Diagnosis not present

## 2018-08-26 DIAGNOSIS — E7800 Pure hypercholesterolemia, unspecified: Secondary | ICD-10-CM | POA: Diagnosis present

## 2018-08-26 DIAGNOSIS — W19XXXA Unspecified fall, initial encounter: Secondary | ICD-10-CM | POA: Diagnosis present

## 2018-08-26 DIAGNOSIS — Z8711 Personal history of peptic ulcer disease: Secondary | ICD-10-CM | POA: Diagnosis not present

## 2018-08-26 DIAGNOSIS — K5900 Constipation, unspecified: Secondary | ICD-10-CM | POA: Diagnosis not present

## 2018-08-26 DIAGNOSIS — I5022 Chronic systolic (congestive) heart failure: Secondary | ICD-10-CM | POA: Diagnosis present

## 2018-08-26 DIAGNOSIS — D649 Anemia, unspecified: Secondary | ICD-10-CM

## 2018-08-26 DIAGNOSIS — F3162 Bipolar disorder, current episode mixed, moderate: Secondary | ICD-10-CM | POA: Diagnosis present

## 2018-08-26 DIAGNOSIS — F10239 Alcohol dependence with withdrawal, unspecified: Secondary | ICD-10-CM | POA: Diagnosis present

## 2018-08-26 DIAGNOSIS — Y92019 Unspecified place in single-family (private) house as the place of occurrence of the external cause: Secondary | ICD-10-CM | POA: Diagnosis not present

## 2018-08-26 DIAGNOSIS — Y838 Other surgical procedures as the cause of abnormal reaction of the patient, or of later complication, without mention of misadventure at the time of the procedure: Secondary | ICD-10-CM | POA: Diagnosis present

## 2018-08-26 DIAGNOSIS — I110 Hypertensive heart disease with heart failure: Secondary | ICD-10-CM | POA: Diagnosis present

## 2018-08-26 DIAGNOSIS — K219 Gastro-esophageal reflux disease without esophagitis: Secondary | ICD-10-CM | POA: Diagnosis present

## 2018-08-26 DIAGNOSIS — Z880 Allergy status to penicillin: Secondary | ICD-10-CM | POA: Diagnosis not present

## 2018-08-26 DIAGNOSIS — Z888 Allergy status to other drugs, medicaments and biological substances status: Secondary | ICD-10-CM | POA: Diagnosis not present

## 2018-08-26 DIAGNOSIS — M1712 Unilateral primary osteoarthritis, left knee: Secondary | ICD-10-CM | POA: Diagnosis present

## 2018-08-26 DIAGNOSIS — H547 Unspecified visual loss: Secondary | ICD-10-CM | POA: Diagnosis present

## 2018-08-26 DIAGNOSIS — D62 Acute posthemorrhagic anemia: Secondary | ICD-10-CM | POA: Diagnosis present

## 2018-08-26 DIAGNOSIS — F17210 Nicotine dependence, cigarettes, uncomplicated: Secondary | ICD-10-CM | POA: Diagnosis present

## 2018-08-26 HISTORY — PX: IRRIGATION AND DEBRIDEMENT ABSCESS: SHX5252

## 2018-08-26 LAB — PREPARE RBC (CROSSMATCH)

## 2018-08-26 LAB — CBC
HCT: 24.7 % — ABNORMAL LOW (ref 39.0–52.0)
HCT: 30.8 % — ABNORMAL LOW (ref 39.0–52.0)
Hemoglobin: 7.9 g/dL — ABNORMAL LOW (ref 13.0–17.0)
Hemoglobin: 10.1 g/dL — ABNORMAL LOW (ref 13.0–17.0)
MCH: 31.9 pg (ref 26.0–34.0)
MCH: 30.7 pg (ref 26.0–34.0)
MCHC: 32 g/dL (ref 30.0–36.0)
MCHC: 32.8 g/dL (ref 30.0–36.0)
MCV: 99.6 fL (ref 80.0–100.0)
MCV: 93.6 fL (ref 80.0–100.0)
Platelets: 312 10*3/uL (ref 150–400)
Platelets: 278 10*3/uL (ref 150–400)
RBC: 2.48 MIL/uL — ABNORMAL LOW (ref 4.22–5.81)
RBC: 3.29 MIL/uL — ABNORMAL LOW (ref 4.22–5.81)
RDW: 15.1 % (ref 11.5–15.5)
RDW: 17.3 % — ABNORMAL HIGH (ref 11.5–15.5)
WBC: 10.1 10*3/uL (ref 4.0–10.5)
WBC: 10 10*3/uL (ref 4.0–10.5)
nRBC: 0 % (ref 0.0–0.2)
nRBC: 0 % (ref 0.0–0.2)

## 2018-08-26 LAB — BASIC METABOLIC PANEL
Anion gap: 9 (ref 5–15)
BUN: 22 mg/dL — ABNORMAL HIGH (ref 6–20)
CO2: 20 mmol/L — ABNORMAL LOW (ref 22–32)
Calcium: 8.9 mg/dL (ref 8.9–10.3)
Chloride: 113 mmol/L — ABNORMAL HIGH (ref 98–111)
Creatinine, Ser: 1.52 mg/dL — ABNORMAL HIGH (ref 0.61–1.24)
GFR calc Af Amer: 57 mL/min — ABNORMAL LOW (ref >60)
GFR calc non Af Amer: 49 mL/min — ABNORMAL LOW (ref >60)
Glucose, Bld: 90 mg/dL (ref 70–99)
Potassium: 4.4 mmol/L (ref 3.5–5.1)
Sodium: 142 mmol/L (ref 135–145)

## 2018-08-26 LAB — GLUCOSE, CAPILLARY: Glucose-Capillary: 91 mg/dL (ref 70–99)

## 2018-08-26 LAB — PROTIME-INR
INR: 1.1 (ref 0.8–1.2)
Prothrombin Time: 14.1 seconds (ref 11.4–15.2)

## 2018-08-26 LAB — HEMATOCRIT: HCT: 24.1 % — ABNORMAL LOW (ref 39.0–52.0)

## 2018-08-26 LAB — HEMOGLOBIN: Hemoglobin: 7.9 g/dL — ABNORMAL LOW (ref 13.0–17.0)

## 2018-08-26 SURGERY — IRRIGATION AND DEBRIDEMENT ABSCESS
Anesthesia: General | Site: Chest | Laterality: Right

## 2018-08-26 MED ORDER — FENTANYL CITRATE (PF) 250 MCG/5ML IJ SOLN
INTRAMUSCULAR | Status: DC | PRN
  Administered 2018-08-26: 100 ug via INTRAVENOUS
  Administered 2018-08-26: 50 ug via INTRAVENOUS

## 2018-08-26 MED ORDER — FENTANYL CITRATE (PF) 250 MCG/5ML IJ SOLN
INTRAMUSCULAR | Status: AC
  Filled 2018-08-26: qty 5

## 2018-08-26 MED ORDER — SUCCINYLCHOLINE CHLORIDE 20 MG/ML IJ SOLN
INTRAMUSCULAR | Status: DC | PRN
  Administered 2018-08-26: 160 mg via INTRAVENOUS

## 2018-08-26 MED ORDER — PHENYLEPHRINE 40 MCG/ML (10ML) SYRINGE FOR IV PUSH (FOR BLOOD PRESSURE SUPPORT)
PREFILLED_SYRINGE | INTRAVENOUS | Status: DC | PRN
  Administered 2018-08-26: 80 ug via INTRAVENOUS

## 2018-08-26 MED ORDER — CIPROFLOXACIN IN D5W 400 MG/200ML IV SOLN
INTRAVENOUS | Status: DC | PRN
  Administered 2018-08-26: 400 mg via INTRAVENOUS

## 2018-08-26 MED ORDER — CIPROFLOXACIN IN D5W 400 MG/200ML IV SOLN
INTRAVENOUS | Status: AC
  Filled 2018-08-26: qty 200

## 2018-08-26 MED ORDER — LIDOCAINE 2% (20 MG/ML) 5 ML SYRINGE
INTRAMUSCULAR | Status: DC | PRN
  Administered 2018-08-26: 60 mg via INTRAVENOUS

## 2018-08-26 MED ORDER — SODIUM CHLORIDE 0.45 % IV SOLN
INTRAVENOUS | Status: DC
  Administered 2018-08-26 – 2018-08-27 (×2): via INTRAVENOUS
  Filled 2018-08-26 (×6): qty 1000

## 2018-08-26 MED ORDER — DEXAMETHASONE SODIUM PHOSPHATE 10 MG/ML IJ SOLN
INTRAMUSCULAR | Status: DC | PRN
  Administered 2018-08-26: 10 mg via INTRAVENOUS

## 2018-08-26 MED ORDER — HEMOSTATIC AGENTS (NO CHARGE) OPTIME
TOPICAL | Status: DC | PRN
  Administered 2018-08-26: 1 via TOPICAL

## 2018-08-26 MED ORDER — PROPOFOL 10 MG/ML IV BOLUS
INTRAVENOUS | Status: DC | PRN
  Administered 2018-08-26: 80 mg via INTRAVENOUS

## 2018-08-26 MED ORDER — CIPROFLOXACIN IN D5W 400 MG/200ML IV SOLN: 400.0000 mg | INTRAVENOUS | Status: DC

## 2018-08-26 MED ORDER — CEFAZOLIN SODIUM-DEXTROSE 2-4 GM/100ML-% IV SOLN
2.0000 g | Freq: Once | INTRAVENOUS | Status: DC
  Filled 2018-08-26: qty 100

## 2018-08-26 MED ORDER — ONDANSETRON HCL 4 MG/2ML IJ SOLN
INTRAMUSCULAR | Status: DC | PRN
  Administered 2018-08-26: 4 mg via INTRAVENOUS

## 2018-08-26 MED ORDER — PROPOFOL 10 MG/ML IV BOLUS
INTRAVENOUS | Status: AC
  Filled 2018-08-26: qty 20

## 2018-08-26 MED ORDER — FENTANYL CITRATE (PF) 100 MCG/2ML IJ SOLN: 25.0000 ug | INTRAMUSCULAR | Status: DC | PRN

## 2018-08-26 MED ORDER — BUPIVACAINE-EPINEPHRINE 0.25% -1:200000 IJ SOLN
INTRAMUSCULAR | Status: AC
  Filled 2018-08-26: qty 1

## 2018-08-26 MED ORDER — MIDAZOLAM HCL 2 MG/2ML IJ SOLN
INTRAMUSCULAR | Status: AC
  Filled 2018-08-26: qty 2

## 2018-08-26 MED ORDER — LACTATED RINGERS IV SOLN
INTRAVENOUS | Status: DC
  Administered 2018-08-26: 10:00:00 via INTRAVENOUS

## 2018-08-26 MED ORDER — ACETAMINOPHEN 500 MG PO TABS
1000.0000 mg | ORAL_TABLET | Freq: Once | ORAL | Status: AC
  Administered 2018-08-26: 1000 mg via ORAL
  Filled 2018-08-26: qty 2

## 2018-08-26 SURGICAL SUPPLY — 40 items
APL PRP STRL LF DISP 70% ISPRP (MISCELLANEOUS)
BNDG GAUZE ELAST 4 BULKY (GAUZE/BANDAGES/DRESSINGS) ×1 IMPLANT
CANISTER SUCT 3000ML PPV (MISCELLANEOUS) IMPLANT
CHLORAPREP W/TINT 26 (MISCELLANEOUS) IMPLANT
CLEANER TIP ELECTROSURG 2X2 (MISCELLANEOUS) ×2 IMPLANT
COVER SURGICAL LIGHT HANDLE (MISCELLANEOUS) ×2 IMPLANT
COVER WAND RF STERILE (DRAPES) ×2 IMPLANT
DECANTER SPIKE VIAL GLASS SM (MISCELLANEOUS) IMPLANT
DRAPE HALF SHEET 40X57 (DRAPES) IMPLANT
DRAPE LAPAROSCOPIC ABDOMINAL (DRAPES) ×2 IMPLANT
DRSG PAD ABDOMINAL 8X10 ST (GAUZE/BANDAGES/DRESSINGS) IMPLANT
ELECT REM PT RETURN 9FT ADLT (ELECTROSURGICAL) ×2
ELECTRODE REM PT RTRN 9FT ADLT (ELECTROSURGICAL) ×1 IMPLANT
GAUZE 4X4 16PLY RFD (DISPOSABLE) IMPLANT
GAUZE SPONGE 4X4 12PLY STRL (GAUZE/BANDAGES/DRESSINGS) ×2 IMPLANT
GAUZE SPONGE 4X4 12PLY STRL LF (GAUZE/BANDAGES/DRESSINGS) ×1 IMPLANT
GLOVE BIO SURGEON STRL SZ7 (GLOVE) ×2 IMPLANT
GLOVE BIOGEL PI IND STRL 7.5 (GLOVE) ×1 IMPLANT
GLOVE BIOGEL PI INDICATOR 7.5 (GLOVE) ×1
GOWN STRL REUS W/ TWL LRG LVL3 (GOWN DISPOSABLE) ×2 IMPLANT
GOWN STRL REUS W/TWL LRG LVL3 (GOWN DISPOSABLE) ×4
KIT BASIN OR (CUSTOM PROCEDURE TRAY) ×2 IMPLANT
KIT TURNOVER KIT B (KITS) ×2 IMPLANT
NDL HYPO 25GX1X1/2 BEV (NEEDLE) IMPLANT
NEEDLE HYPO 25GX1X1/2 BEV (NEEDLE) IMPLANT
NS IRRIG 1000ML POUR BTL (IV SOLUTION) ×2 IMPLANT
PACK GENERAL/GYN (CUSTOM PROCEDURE TRAY) ×2 IMPLANT
PAD ABD 8X10 STRL (GAUZE/BANDAGES/DRESSINGS) ×1 IMPLANT
PAD ARMBOARD 7.5X6 YLW CONV (MISCELLANEOUS) ×4 IMPLANT
PENCIL SMOKE EVACUATOR (MISCELLANEOUS) ×2 IMPLANT
SPECIMEN JAR SMALL (MISCELLANEOUS) ×2 IMPLANT
SUT MNCRL AB 4-0 PS2 18 (SUTURE) IMPLANT
SUT VIC AB 3-0 SH 27 (SUTURE)
SUT VIC AB 3-0 SH 27XBRD (SUTURE) IMPLANT
SWAB COLLECTION DEVICE MRSA (MISCELLANEOUS) IMPLANT
SWAB CULTURE ESWAB REG 1ML (MISCELLANEOUS) IMPLANT
SYR CONTROL 10ML LL (SYRINGE) IMPLANT
TAPE CLOTH SURG 4X10 WHT LF (GAUZE/BANDAGES/DRESSINGS) ×1 IMPLANT
TOWEL GREEN STERILE FF (TOWEL DISPOSABLE) ×2 IMPLANT
TOWEL OR 17X26 10 PK STRL BLUE (TOWEL DISPOSABLE) ×2 IMPLANT

## 2018-08-26 NOTE — Discharge Summary (Signed)
Family Medicine [facility] Discharge Summary  Patient name: [name] [name] Medical record number: [id number] Date of birth: 02/28/1960 Age: [age] y.o. Gender: male Date of Admission: 08/25/2018  Date of Discharge: 08/27/2018 Admitting Physician: Moses MannersWilliam A Hensel, MD  Primary Care Provider: Marthenia RollingBland, Scott, DO Consultants: surgery, PT, OT  Indication for Hospitalization: Persistent Rt axillary wound bleed leading to anemia.  Discharge Diagnoses/Problem List:  Anemia 2/2 wound bleed Chronic hypotension CVA (07/17/18) HFrEF GERD MDD Tobacco use Nasal bone fracture  Disposition: discharged home  Discharge Condition: Improved  Discharge Exam:  Gen: Alert and Oriented x to person, place and time, no apparent distress Heart: RRR, no murmurs noted Lungs: CTAB Abd: No abdominal pain Skin: Right sided mid-axillary surgical wound present with no drainage and no erythema present.  Brief Hospital Course:  [name] Pattersonnthony L Watersis a 58 y.o.malepresenting with persistent right axillary wound bleed and found to have hemoglobin of 7.0, down from 10.6 on 6/25.PMH is significant for Recent CVA (07/17/2018),vascular dementia,alcoholism, bipolar 1, encephalomalacia, HFrEF, HTN,depression.  His hospital course is outlined below.  Anemia 2/2 right axillary wound bleeding Patient presented with hemoglobin 7.0 on admission, down from 10.6 on 6/25.  Admission details can be found in H&P.  Patient had right axillary wound I&D on 6/23 during previous hospitalization.  Wound care had been performing dressing changes, but patient continued to have bleeding from wound.  On presentation to ED, patient also had mild hypotension.  Patient received 1 unit pRBC while in the hospital and posttransfusion H&H improved to 7.9.  General surgery was consulted, and patient was taken to the OR for wound exploration and ligation of hemorrhaging vessel on 6/30.  While in OR, patient also received a second unit of pRBC.   With achievement hemostasis and blood transfusion, patient's hemoglobin and blood pressure improved throughout his hospitalization.  Hemoglobin at the time of discharge was 9.1.  Issues for Follow Up:  1. Follow up appointment with family medicine for CBC tomorrow to check hemoglobin levels 2. Follow up with surgery/wound care  Significant Procedures: 6/30 right axillary wound exploration and ligation of hemorrhaging vessel, 2 units pRBC  Significant Labs and Imaging:  Recent Labs  Lab 08/26/18 0223 08/26/18 1459 08/27/18 0802  WBC 10.1 10.0 10.4  HGB 7.9*  7.9* 10.1* 9.1*  HCT 24.7*  24.1* 30.8* 28.7*  PLT 312 278 242   Recent Labs  Lab 08/21/18 1539 08/25/18 1512 08/26/18 0223 08/27/18 0802  NA 135 140 142 139  K 4.1 4.0 4.4 3.9  CL 106 113* 113* 108  CO2 18* 20* 20* 19*  GLUCOSE 136* 140* 90 111*  BUN 20 23* 22* 17  CREATININE 1.50* 1.59* 1.52* 1.35*  CALCIUM 9.1 8.5* 8.9 8.9  ALKPHOS 71  --   --   --   AST 47*  --   --   --   ALT 67*  --   --   --   ALBUMIN 2.4*  --   --   --     Ct Head Wo Contrast  Result Date: 08/15/2018 CLINICAL DATA:  Fall, found down EXAM: CT HEAD WITHOUT CONTRAST CT MAXILLOFACIAL WITHOUT CONTRAST CT CERVICAL SPINE WITHOUT CONTRAST TECHNIQUE: Multidetector CT imaging of the head, cervical spine, and maxillofacial structures were performed using the standard protocol without intravenous contrast. Multiplanar CT image reconstructions of the cervical spine and maxillofacial structures were also generated. COMPARISON:  CT brain, 05/25/2017 FINDINGS: CT HEAD FINDINGS Brain: No evidence of acute infarction, hemorrhage, hydrocephalus, extra-axial  collection or mass lesion/mass effect. Extensive periventricular and deep white matter hypodensity and global volume. Vascular: No hyperdense vessel or unexpected calcification. CT FACIAL BONES FINDINGS Skull: Normal. Negative for fracture or focal lesion. Facial bones: Multiple nondisplaced fractures of the  nasal bones (series 5, image 37). No displaced fractures or dislocations. Sinuses/Orbits: No acute finding. Right ocular prosthesis. Chronic fracture deformity of the left orbital floor. Other: None. CT CERVICAL SPINE FINDINGS Alignment: Normal. Skull base and vertebrae: Minimally displaced fractures of the right transverse process and vertebral foramen of the C4 and C5 vertebral bodies (series 15, image 55, 64). There is a tiny, nondisplaced corner type fracture of the anterior inferior endplate of C6 (series 16, image 32). There is a nonacute, although new compared to prior examination, fracture of the C6 spinous process (series 16, image 33). No primary bone lesion or focal pathologic process. Soft tissues and spinal canal: No prevertebral fluid or swelling. No visible canal hematoma. Disc levels:  Intact. Upper chest: Negative. Other: None. IMPRESSION: 1. No acute intracranial pathology. Extensive periventricular and deep white matter hypodensity and global volume, which likely reflects unusually small-vessel white matter disease. 2. Multiple nondisplaced fractures of the nasal bones (series 5, image 37). No other acute displaced fracture of the facial bones. 3. Right ocular prosthesis. Chronic fracture deformity of the left orbital floor. 4. Minimally displaced fractures of the right transverse process and vertebral foramen of the C4 and C5 vertebral bodies (series 15, image 55, 64). There is a tiny, nondisplaced corner type fracture of the anterior inferior endplate of C6 (series 16, image 32), of uncertain acuity. There is a nonacute, although new compared to prior examination, fracture of the C6 spinous process (series 16, image 33). Although new compared to prior examination dated 05/25/2017, these are of uncertain acuity given appearance and history of multiple falls. Electronically Signed   By: Eddie Candle M.D.   On: 08/15/2018 15:38   Ct Chest W Contrast  Result Date: 08/15/2018 CLINICAL DATA:   Status post fall. Area of redness under the left arm noted clinically. EXAM: CT CHEST WITH CONTRAST TECHNIQUE: Multidetector CT imaging of the chest was performed during intravenous contrast administration. CONTRAST:  19mL OMNIPAQUE IOHEXOL 300 MG/ML  SOLN COMPARISON:  Chest radiograph 08/15/2018 FINDINGS: Cardiovascular: No significant vascular findings. Normal heart size. No pericardial effusion. Mediastinum/Nodes: No enlarged mediastinal, hilar, or axillary lymph nodes. Thyroid gland, trachea, and esophagus demonstrate no significant findings. Lungs/Pleura: Bibasilar atelectasis. The lungs otherwise clear. Upper Abdomen: No acute abnormality. Musculoskeletal: Mild chronic appearing, with sclerotic borders, compression deformity of the superior endplate of T4 vertebral body. Complex fluid collection within the right lateral chest wall spends approximately 11 cm anterior to posterior dimension. There is mild overlying skin thickening. IMPRESSION: 1. Bibasilar atelectasis. 2. Mild chronic appearing compression deformity of the superior endplate of T4 vertebral body may be posttraumatic or degenerative. 3. Large complex fluid collection within the right lateral chest wall, with overlying skin thickening. This may represent subcutaneous hematoma or an abscess formation. Electronically Signed   By: Fidela Salisbury M.D.   On: 08/15/2018 15:40   Ct Cervical Spine Wo Contrast  Result Date: 08/15/2018 CLINICAL DATA:  Fall, found down EXAM: CT HEAD WITHOUT CONTRAST CT MAXILLOFACIAL WITHOUT CONTRAST CT CERVICAL SPINE WITHOUT CONTRAST TECHNIQUE: Multidetector CT imaging of the head, cervical spine, and maxillofacial structures were performed using the standard protocol without intravenous contrast. Multiplanar CT image reconstructions of the cervical spine and maxillofacial structures were also generated. COMPARISON:  CT  brain, 05/25/2017 FINDINGS: CT HEAD FINDINGS Brain: No evidence of acute infarction, hemorrhage,  hydrocephalus, extra-axial collection or mass lesion/mass effect. Extensive periventricular and deep white matter hypodensity and global volume. Vascular: No hyperdense vessel or unexpected calcification. CT FACIAL BONES FINDINGS Skull: Normal. Negative for fracture or focal lesion. Facial bones: Multiple nondisplaced fractures of the nasal bones (series 5, image 37). No displaced fractures or dislocations. Sinuses/Orbits: No acute finding. Right ocular prosthesis. Chronic fracture deformity of the left orbital floor. Other: None. CT CERVICAL SPINE FINDINGS Alignment: Normal. Skull base and vertebrae: Minimally displaced fractures of the right transverse process and vertebral foramen of the C4 and C5 vertebral bodies (series 15, image 55, 64). There is a tiny, nondisplaced corner type fracture of the anterior inferior endplate of C6 (series 16, image 32). There is a nonacute, although new compared to prior examination, fracture of the C6 spinous process (series 16, image 33). No primary bone lesion or focal pathologic process. Soft tissues and spinal canal: No prevertebral fluid or swelling. No visible canal hematoma. Disc levels:  Intact. Upper chest: Negative. Other: None. IMPRESSION: 1. No acute intracranial pathology. Extensive periventricular and deep white matter hypodensity and global volume, which likely reflects unusually small-vessel white matter disease. 2. Multiple nondisplaced fractures of the nasal bones (series 5, image 37). No other acute displaced fracture of the facial bones. 3. Right ocular prosthesis. Chronic fracture deformity of the left orbital floor. 4. Minimally displaced fractures of the right transverse process and vertebral foramen of the C4 and C5 vertebral bodies (series 15, image 55, 64). There is a tiny, nondisplaced corner type fracture of the anterior inferior endplate of C6 (series 16, image 32), of uncertain acuity. There is a nonacute, although new compared to prior examination,  fracture of the C6 spinous process (series 16, image 33). Although new compared to prior examination dated 05/25/2017, these are of uncertain acuity given appearance and history of multiple falls. Electronically Signed   By: Lauralyn PrimesAlex  Bibbey M.D.   On: 08/15/2018 15:38   Dg Chest Portable 1 View  Result Date: 08/21/2018 CLINICAL DATA:  Postoperative bleeding EXAM: PORTABLE CHEST 1 VIEW COMPARISON:  Portable exam 1543 hours compared to 08/15/2018 FINDINGS: Borderline enlargement of cardiac silhouette. Mediastinal contours and pulmonary vascularity normal. Increased opacity LEFT base question atelectasis versus infiltrate. Remaining lungs clear. No pleural effusion or pneumothorax. Bones demineralized. IMPRESSION: Atelectasis versus infiltrate at LEFT lung base. Electronically Signed   By: Ulyses SouthwardMark  Boles M.D.   On: 08/21/2018 16:23   Dg Chest Port 1 View  Result Date: 08/15/2018 CLINICAL DATA:  Found down. EXAM: PORTABLE CHEST 1 VIEW COMPARISON:  07/26/2015 FINDINGS: The cardiac silhouette, mediastinal and hilar contours are within normal limits given the AP projection and portable technique. Low lung volumes with vascular crowding and streaky basilar atelectasis. No infiltrates, edema or effusions. The bony thorax is grossly intact. Remote healed left rib fractures are noted. IMPRESSION: Low lung volumes with vascular crowding and bibasilar atelectasis. Electronically Signed   By: Rudie MeyerP.  Gallerani M.D.   On: 08/15/2018 14:07   Ct Maxillofacial Wo Contrast  Result Date: 08/15/2018 CLINICAL DATA:  Fall, found down EXAM: CT HEAD WITHOUT CONTRAST CT MAXILLOFACIAL WITHOUT CONTRAST CT CERVICAL SPINE WITHOUT CONTRAST TECHNIQUE: Multidetector CT imaging of the head, cervical spine, and maxillofacial structures were performed using the standard protocol without intravenous contrast. Multiplanar CT image reconstructions of the cervical spine and maxillofacial structures were also generated. COMPARISON:  CT brain, 05/25/2017  FINDINGS: CT HEAD FINDINGS Brain: No  evidence of acute infarction, hemorrhage, hydrocephalus, extra-axial collection or mass lesion/mass effect. Extensive periventricular and deep white matter hypodensity and global volume. Vascular: No hyperdense vessel or unexpected calcification. CT FACIAL BONES FINDINGS Skull: Normal. Negative for fracture or focal lesion. Facial bones: Multiple nondisplaced fractures of the nasal bones (series 5, image 37). No displaced fractures or dislocations. Sinuses/Orbits: No acute finding. Right ocular prosthesis. Chronic fracture deformity of the left orbital floor. Other: None. CT CERVICAL SPINE FINDINGS Alignment: Normal. Skull base and vertebrae: Minimally displaced fractures of the right transverse process and vertebral foramen of the C4 and C5 vertebral bodies (series 15, image 55, 64). There is a tiny, nondisplaced corner type fracture of the anterior inferior endplate of C6 (series 16, image 32). There is a nonacute, although new compared to prior examination, fracture of the C6 spinous process (series 16, image 33). No primary bone lesion or focal pathologic process. Soft tissues and spinal canal: No prevertebral fluid or swelling. No visible canal hematoma. Disc levels:  Intact. Upper chest: Negative. Other: None. IMPRESSION: 1. No acute intracranial pathology. Extensive periventricular and deep white matter hypodensity and global volume, which likely reflects unusually small-vessel white matter disease. 2. Multiple nondisplaced fractures of the nasal bones (series 5, image 37). No other acute displaced fracture of the facial bones. 3. Right ocular prosthesis. Chronic fracture deformity of the left orbital floor. 4. Minimally displaced fractures of the right transverse process and vertebral foramen of the C4 and C5 vertebral bodies (series 15, image 55, 64). There is a tiny, nondisplaced corner type fracture of the anterior inferior endplate of C6 (series 16, image 32), of  uncertain acuity. There is a nonacute, although new compared to prior examination, fracture of the C6 spinous process (series 16, image 33). Although new compared to prior examination dated 05/25/2017, these are of uncertain acuity given appearance and history of multiple falls. Electronically Signed   By: Lauralyn Primes M.D.   On: 08/15/2018 15:38    Results/Tests Pending at Time of Discharge: None  Discharge Medications:  Allergies as of 08/27/2018      Reactions   Ace Inhibitors Anaphylaxis, Swelling   Tongue and face became swollen   Penicillins Rash   From childhood: Has patient had a PCN reaction causing immediate rash, facial/tongue/throat swelling, SOB or lightheadedness with hypotension: Yes Has patient had a PCN reaction causing severe rash involving mucus membranes or skin necrosis: Unk Has patient had a PCN reaction that required hospitalization: Unk Has patient had a PCN reaction occurring within the last 10 years: No If all of the above answers are "NO", then may proceed with Cephalosporin use.      Medication List    STOP taking these medications   doxycycline 100 MG tablet Commonly known as: VIBRA-TABS   hydrALAZINE 10 MG tablet Commonly known as: APRESOLINE   isosorbide dinitrate 20 MG tablet Commonly known as: ISORDIL   metoprolol tartrate 100 MG tablet Commonly known as: LOPRESSOR   oxyCODONE-acetaminophen 5-325 MG tablet Commonly known as: PERCOCET/ROXICET     TAKE these medications   acetaminophen 325 MG tablet Commonly known as: TYLENOL Take 650 mg by mouth every 6 (six) hours as needed for mild pain.   allopurinol 300 MG tablet Commonly known as: ZYLOPRIM TAKE 1 TABLET BY MOUTH EVERY DAY   amLODipine 5 MG tablet Commonly known as: NORVASC Take 1 tablet (5 mg total) by mouth daily. Start taking on: August 28, 2018 What changed:   medication strength  how much  to take   atorvastatin 80 MG tablet Commonly known as: LIPITOR Take 1 tablet (80 mg  total) by mouth daily.   clopidogrel 75 MG tablet Commonly known as: PLAVIX Take 1 tablet (75 mg total) by mouth daily.   diclofenac sodium 1 % Gel Commonly known as: VOLTAREN Apply 2 g topically 4 (four) times daily.   erythromycin ophthalmic ointment Place 1 application into the right eye 3 (three) times daily.   FLUoxetine 40 MG capsule Commonly known as: PROZAC Take 40 mg by mouth at bedtime.   folic acid 1 MG tablet Commonly known as: FOLVITE TAKE 1 TABLET (1 MG TOTAL) BY MOUTH DAILY. What changed:   how much to take  how to take this  when to take this  additional instructions   lactulose 10 GM/15ML solution Commonly known as: CHRONULAC Take 45 mLs (30 g total) by mouth daily as needed for mild constipation.   Nicotine Step 1 21 mg/24hr patch Generic drug: nicotine PLACE 1 PATCH ONTO THE SKIN DAILY. What changed: See the new instructions.   omega-3 acid ethyl esters 1 g capsule Commonly known as: LOVAZA TAKE 1 CAPSULE (1 G TOTAL) BY MOUTH DAILY. What changed:   how much to take  how to take this  when to take this   pantoprazole 40 MG tablet Commonly known as: PROTONIX Take 1 tablet (40 mg total) by mouth daily.            Durable Medical Equipment  (From admission, onward)         Start     Ordered   08/27/18 0910  For home use only DME Negative pressure wound device  Once    Question Answer Comment  Frequency of dressing change 3 times per week   Length of need 3 Months   Dressing type Foam   Amount of suction 125 mm/Hg   Pressure application Continuous pressure   Supplies 10 canisters and 15 dressings per month for duration of therapy      08/27/18 0910          Discharge Instructions: Please refer to Patient Instructions section of EMR for full details.  Patient was counseled important signs and symptoms that should prompt return to medical care, changes in medications, dietary instructions, activity restrictions, and follow up  appointments.   Follow-Up Appointments: Follow-up Information    [facility] [place] Go on 09/09/2018.   Specialty: General Surgery Why: Your appointment is 7/14 at 10am. Please arrive 30 minutes prior to your appointment to check in and fill out paperwork. Bring photo ID and insurance information. Contact information: [address] [place] [id number] [phone]       Care, [facility] Follow up.   Specialty: Home Health Services Why: RN, PT, Speech Contact information: [address] [place] [address] [phone]        Marthenia RollingBland, Scott, DO Follow up on 09/08/2018.   Specialty: Family Medicine Why: 3:10 lab draw on july 2nd. at 9:15 Contact information: [address] [place] [address] [phone]           Jackelyn PolingWelborn, Suhaila Troiano, MD 08/27/2018, 1:06 PM PGY-1, [facility]

## 2018-08-26 NOTE — Progress Notes (Signed)
Pt is having his wound cauterized and will try at another time.   08/26/18 1100  PT Visit Information  Last PT Received On 08/26/18  Reason Eval/Treat Not Completed Patient at procedure or test/unavailable   Mee Hives, PT MS Acute Rehab Dept. Number: [place] and [place]

## 2018-08-26 NOTE — Progress Notes (Signed)
Pt was not feeling like doing any therapy, but had been given general anesthesia for his procedure and has just returned to the floor.  Follow up as time and pt allow.   08/26/18 1300  PT Visit Information  Last PT Received On 08/26/18  Reason Eval/Treat Not Completed Fatigue/lethargy limiting ability to participate;Other (comment)    Mee Hives, PT MS Acute Rehab Dept. Number: [place] and [place]

## 2018-08-26 NOTE — Transfer of Care (Signed)
Immediate Anesthesia Transfer of Care Note  Patient: [name] [name]  Procedure(s) Performed: WOUND HEMOSTASIS FOR RIGHT AXILLARY WOUND/CHEST WALL WOUND (Right Chest)  Patient Location: PACU  Anesthesia Type:General  Level of Consciousness: awake, alert  and oriented  Airway & Oxygen Therapy: Patient Spontanous Breathing and Patient connected to face mask oxygen  Post-op Assessment: Report given to RN, Post -op Vital signs reviewed and stable and Patient moving all extremities X 4  Post vital signs: Reviewed and stable  Last Vitals:  Vitals Value Taken Time  BP    Temp    Pulse    Resp    SpO2      Last Pain:  Vitals:   08/26/18 0732  TempSrc: Oral  PainSc: 0-No pain         Complications: No apparent anesthesia complications

## 2018-08-26 NOTE — Op Note (Signed)
Preoperative diagnosis: hemorrhage from right chest wall wound Postoperative diagnosis: same as above Procedure: wound exploration, ligation of hemorrhaging vessel Surgeon Dr Serita Grammes Anesthesia general  EBL: 30 cc Specimens none Drains none Complications none Sponge and needle count correct dispo to recovery stable.  Indications: This is a 76 yom who had right chest wall abscess debrided last week by one of my partners. Since then he has lost significant amount of blood requiring transfusion. This was bleeding this morning and I elected to take to OR.  Procedure: After informed consent obtained patient was taken to the OR.  He was given abx. SCDs were in place. He was placed under general anesthesia without complication. He was prepped and draped in standard sterile fashion. Timeout performed.  I explored the wound and was able to identify a muscle artery that was bleeding.  This was from the side of vessel and I used several 3-0 vicryl ligatures to control this. It was hemostatic upon completion. I placed some surgicel snow over the raw muscle at base. Dressing applied tolerated well. Transferred to recovery  I tried to call wife but got no answer.

## 2018-08-26 NOTE — Anesthesia Preprocedure Evaluation (Addendum)
Anesthesia Evaluation  Patient identified by MRN, date of birth, ID band Patient awake    Reviewed: Allergy & Precautions, NPO status , Patient's Chart, lab work & pertinent test results, reviewed documented beta blocker date and time   Airway Mallampati: III  TM Distance: >3 FB Neck ROM: Full  Mouth opening: Limited Mouth Opening  Dental no notable dental hx. (+) Teeth Intact, Dental Advisory Given   Pulmonary neg pulmonary ROS, Current Smoker,    Pulmonary exam normal breath sounds clear to auscultation       Cardiovascular hypertension, Pt. on home beta blockers and Pt. on medications Normal cardiovascular exam Rhythm:Regular Rate:Normal  TTE 2020 EF 45-50%, valves ok   Neuro/Psych PSYCHIATRIC DISORDERS Depression Bipolar Disorder Dementia CVA (last one 2020)    GI/Hepatic PUD, GERD  Medicated,(+) Cirrhosis     substance abuse  alcohol use,   Endo/Other  negative endocrine ROS  Renal/GU negative Renal ROS  negative genitourinary   Musculoskeletal  (+) Arthritis ,   Abdominal   Peds  Hematology  (+) Blood dyscrasia (on plavix, Hgb 7.9), anemia ,   Anesthesia Other Findings   Reproductive/Obstetrics                            Anesthesia Physical Anesthesia Plan  ASA: III  Anesthesia Plan: General   Post-op Pain Management:    Induction: Intravenous  PONV Risk Score and Plan: 1 and Ondansetron, Dexamethasone and Midazolam  Airway Management Planned: Oral ETT and Video Laryngoscope Planned  Additional Equipment:   Intra-op Plan:   Post-operative Plan: Extubation in OR  Informed Consent: I have reviewed the patients History and Physical, chart, labs and discussed the procedure including the risks, benefits and alternatives for the proposed anesthesia with the patient or authorized representative who has indicated his/her understanding and acceptance.     Dental advisory  given  Plan Discussed with: CRNA  Anesthesia Plan Comments:        Anesthesia Quick Evaluation

## 2018-08-26 NOTE — Progress Notes (Signed)
[facility] Progress Note     Subjective: CC-  Comfortable this morning. No complaints. Denies pain in right chest wall wound. States that the same dressing from yesterday is still intact and has not been saturated with blood. Hg 7.9 today from 7.  Objective: Vital signs in last 24 hours: Temp:  [97.5 F (36.4 C)-98.6 F (37 C)] 98 F (36.7 C) (06/30 0732) Pulse Rate:  [62-77] 63 (06/30 0732) Resp:  [16-19] 18 (06/30 0732) BP: (92-129)/(67-86) 129/84 (06/30 0732) SpO2:  [98 %-100 %] 100 % (06/30 0732) Weight:  [93.5 kg-95.2 kg] 93.5 kg (06/29 1842) Last BM Date: (PTA)  Intake/Output from previous day: 06/29 0701 - 06/30 0700 In: 315 [Blood:315] Out: -  Intake/Output this shift: No intake/output data recorded.  PE: Gen:  Alert, NAD Card:  RRR Pulm:  effort normal Abd: Soft, NT/ND Skin: warm and dry, pale Chest wall: wound clean, pink without purulent drainage or cellulitis, after debriding the clot at the base of the wound patient had significant bleeding again; pressure held until hemostasis achieved and pressure dressing applied   Lab Results:  Recent Labs    08/25/18 1512 08/26/18 0223  WBC 9.5 10.1  HGB 7.0* 7.9*  7.9*  HCT 22.8* 24.7*  24.1*  PLT 308 312   BMET Recent Labs    08/25/18 1512 08/26/18 0223  NA 140 142  K 4.0 4.4  CL 113* 113*  CO2 20* 20*  GLUCOSE 140* 90  BUN 23* 22*  CREATININE 1.59* 1.52*  CALCIUM 8.5* 8.9   PT/INR Recent Labs    08/26/18 0223  LABPROT 14.1  INR 1.1   CMP     Component Value Date/Time   NA 142 08/26/2018 0223   NA 140 05/28/2017 1617   K 4.4 08/26/2018 0223   CL 113 (H) 08/26/2018 0223   CO2 20 (L) 08/26/2018 0223   GLUCOSE 90 08/26/2018 0223   BUN 22 (H) 08/26/2018 0223   BUN 10 05/28/2017 1617   CREATININE 1.52 (H) 08/26/2018 0223   CREATININE 1.11 08/12/2015 1417   CALCIUM 8.9 08/26/2018 0223   PROT 6.4 (L) 08/21/2018 1539   PROT 7.6 05/28/2017 1617   ALBUMIN 2.4 (L)  08/21/2018 1539   ALBUMIN 4.3 05/28/2017 1617   AST 47 (H) 08/21/2018 1539   ALT 67 (H) 08/21/2018 1539   ALKPHOS 71 08/21/2018 1539   BILITOT 0.6 08/21/2018 1539   BILITOT 0.5 05/28/2017 1617   GFRNONAA 49 (L) 08/26/2018 0223   GFRNONAA 74 08/12/2015 1417   GFRAA 57 (L) 08/26/2018 0223   GFRAA 85 08/12/2015 1417   Lipase     Component Value Date/Time   LIPASE 41 12/07/2014 2004       Studies/Results: No results found.  Anti-infectives: Anti-infectives (From admission, onward)   None       Assessment/Plan Hx CVA-Plavix 08/14/2018 AMS/fall Alcohol abuse/alcohol withdrawal Old C5-C6 fractures Tobacco use Hx of bipolar disorder/Hx depression GERD/history of duodenal ulcers  R chest wall abscess s/p I&D ofRight chest wall abscess status post fall, Dr. Brantley Stage, 06/23 Bleeding from chest wall wound - Persistent bleeding from wound today. Plan to return to OR today for wound hemostasis. I called and discussed the procedure as well as risks and benefits with the patient wife, Saafir Abdullah. She agreed for [place] to proceed with surgery. Keep NPO.  ID - none FEN - IVF, NPO VTE - SCDs Foley - none Contact - wife Oney Folz [phone]   LOS: 0 days  Franne FortsBrooke A  , [facility] 08/26/2018, 8:24 AM Pager: [phone] Mon-Thurs 7:00 am-4:30 pm Fri 7:00 am -11:30 AM Sat-Sun 7:00 am-11:30 am

## 2018-08-26 NOTE — Progress Notes (Signed)
Family Medicine Teaching Service Daily Progress Note Intern Pager: [phone]  Patient name: [name] [name] Medical record number: [id number] Date of birth: 09/10/1960 Age: 58 y.o. Gender: male  Primary Care Provider: Marthenia RollingBland, Scott, DO Consultants: General Surgery Code Status: Full  Pt Overview and Major Events to Date:  6/29 Admitted to FPTS  Assessment and Plan: [name] [name] is a 58 y.o. male presenting with persistent right axillary wound bleed and found to have hemoglobin of 7.0, down from 10.6 on 6/25. PMH is significant for Recent CVA (07/17/2018),vascular dementia,alcoholism, bipolar 1, encephalomalacia, HFrEF, HTN,depression.  Anemia 2/2 Wound Bleed  S/p I&D of Right Axillary Abscess: Hemoglobin on admission 7.0, down from 10.6 on 6/25.  S/p 1 unit pRBC.  Posttransfusion hemoglobin 7.9.  Initially mildly hypotensive to 97/79, BP now improved to 129/84.  Suspect anemia likely secondary to bleeding from right axillary wound, s/p I&D on 6/23 by general surgery.  General surgery planning to reevaluate this a.m. for possible reexploration.  Patient remains hemodynamically stable. - s/p 1U pRBC - General surgery consulted, appreciate recs             - S/p quick clot with improvement in bleeding             - do not change dressing             - plan to see this a.m.             - NPO after midnight             - low threshold for surgery/further exploration if bleeding persists/patient becomes hemodynamically unstable - hold plavix, SCD's for VTE prophylaxis - N.p.o. pending surgery - tylenol PRN - PT/OT eval  Hypotension  Chronic Hypertension: BP improved this a.m. after receiving blood transfusion.  BP this a.m. 129/84.  Home meds: Amlodipine 10mg  QD, Hydralazine 10mg  TID, Metoprolol 100mg  BID.  - hold home meds, add back as BP tolerates  - continue to monitor  CVA (07/17/18)  Vascular Dementia  CAD Home meds: Plavix 75mg  QD, Atorvastatin 80mg  QD. Unsure if he's been  taking his Plavix at home. - hold plavix given acute bleed - Continue home atorvastatin  HFrEF: Last echo in 07/31/2018 showed an EF of 45 to 50%.  Exam this a.m. patient remains euvolemic.  Home meds: Isordil 20mg  TID, metoprolol 100mg  bid - hold home meds given soft BP's - read as tolerated  GERD:  Home meds: Protonix 40mg  QD - continue home meds  MDD:  Home meds: Fluoxetine 40mg  QD. Denies any SI/HI. - continue home meds  Nasal Bone Fracture: Noted to have nasal bone fracture after fall at home prior to last admission. Maxillary CT showed fractures to the nasal bone. ENT was consulted and recommended follow-up in their clinic at 1 week to assess the nasal bone fracture. Also encouraged to limit nose blowing and regular saline washes. Patient notes he has not followed up with ENT. - encourage outpatient follow up with ENT as recommended in prior admission  Alcohol Use  Tobacco Use: Denies any alcohol use since discharge. Smokes 1PPD of [place] 100. Declines nicotine patch. - nicotine patch upon request  FEN/GI: NPO pending surgery PPx: SCDs  Disposition: pending surgery consult today  Subjective:  Patient denies complaints this morning.  No acute events overnight.  Objective: Temp:  [97.5 F (36.4 C)-98.6 F (37 C)] 98 F (36.7 C) (06/29 2313) Pulse Rate:  [62-77] 62 (06/29 2313) Resp:  [16-19] 16 (06/29 2115)  BP: (92-121)/(67-86) 121/86 (06/29 2313) SpO2:  [98 %-100 %] 100 % (06/29 2313) Weight:  [93.5 kg-95.2 kg] 93.5 kg (06/29 1842)  Physical Exam:  General: 58 y.o. male in NAD Cardio: RRR no m/r/g Lungs: CTAB, no wheezing, no rhonchi, no crackles, no IWOB on RA, bandage on right axilla/chest Abdomen: Soft, non-tender to palpation, non-distended, positive bowel sounds Skin: warm and dry Extremities: No edema   Laboratory: Recent Labs  Lab 08/21/18 1539 08/25/18 1512 08/26/18 0223  WBC 8.4 9.5 10.1  HGB 10.6* 7.0* 7.9*  7.9*  HCT 34.2* 22.8* 24.7*   24.1*  PLT 255 308 312   Recent Labs  Lab 08/21/18 1539 08/25/18 1512 08/26/18 0223  NA 135 140 142  K 4.1 4.0 4.4  CL 106 113* 113*  CO2 18* 20* 20*  BUN 20 23* 22*  CREATININE 1.50* 1.59* 1.52*  CALCIUM 9.1 8.5* 8.9  PROT 6.4*  --   --   BILITOT 0.6  --   --   ALKPHOS 71  --   --   ALT 67*  --   --   AST 47*  --   --   GLUCOSE 136* 140* 90     Imaging/Diagnostic Tests: No results found.  Kellogg, DO 08/26/2018, 7:34 AM PGY-1, [place] Intern pager: [phone], text pages welcome

## 2018-08-26 NOTE — Progress Notes (Signed)
FPTS Interim Progress Note  Patient seen for postop check this afternoon.  Patient sitting up in chair, with table in front of him.  Patient requesting food.  Denies any other complaints, states that pain is well controlled.  Post-op Hgb 10.1.  Rosalie, DO 08/26/2018, 3:27 PM PGY-1, [place] Medicine Service pager [phone]

## 2018-08-26 NOTE — Anesthesia Procedure Notes (Signed)
Procedure Name: Intubation Date/Time: 08/26/2018 11:00 AM Performed by: Gaylene Brooks, CRNA Pre-anesthesia Checklist: Patient identified, Emergency Drugs available, Suction available and Patient being monitored Patient Re-evaluated:Patient Re-evaluated prior to induction Oxygen Delivery Method: Circle System Utilized Preoxygenation: Pre-oxygenation with 100% oxygen Induction Type: IV induction Laryngoscope Size: Glidescope and 4 Grade View: Grade I Tube type: Oral Tube size: 7.5 mm Number of attempts: 1 Airway Equipment and Method: Oral airway and Video-laryngoscopy Placement Confirmation: ETT inserted through vocal cords under direct vision,  positive ETCO2 and breath sounds checked- equal and bilateral Secured at: 23 cm Tube secured with: Tape Dental Injury: Teeth and Oropharynx as per pre-operative assessment

## 2018-08-26 NOTE — Anesthesia Postprocedure Evaluation (Signed)
Anesthesia Post Note  Patient: [name] [name]  Procedure(s) Performed: WOUND HEMOSTASIS FOR RIGHT AXILLARY WOUND/CHEST WALL WOUND (Right Chest)     Patient location during evaluation: PACU Anesthesia Type: General Level of consciousness: awake and alert Pain management: pain level controlled Vital Signs Assessment: post-procedure vital signs reviewed and stable Respiratory status: spontaneous breathing, nonlabored ventilation, respiratory function stable and patient connected to nasal cannula oxygen Cardiovascular status: blood pressure returned to baseline and stable Postop Assessment: no apparent nausea or vomiting Anesthetic complications: no    Last Vitals:  Vitals:   08/26/18 1205 08/26/18 1504  BP: 140/90 118/84  Pulse: 62 67  Resp: 18 18  Temp: 36.8 C 36.4 C  SpO2: 100% 100%    Last Pain:  Vitals:   08/26/18 1504  TempSrc: Oral  PainSc: 0-No pain                 Tekelia Kareem L Daveyon Kitchings

## 2018-08-26 NOTE — Progress Notes (Signed)
Pt more confused than this morning- pt pulled out IV, took tele off, and was exiting bed when RN entered room.

## 2018-08-26 NOTE — Progress Notes (Signed)
   08/26/18 1000  OT Visit Information  Last OT Received On 08/26/18  Reason Eval/Treat Not Completed Patient at procedure or test/ unavailable (OR)   Plan to reattempt.  Tyrone Schimke, OT Acute Rehabilitation Services Pager: [phone] Office: [phone]

## 2018-08-27 ENCOUNTER — Encounter (HOSPITAL_COMMUNITY): Payer: Self-pay | Admitting: General Surgery

## 2018-08-27 LAB — CBC
HCT: 28.7 % — ABNORMAL LOW (ref 39.0–52.0)
Hemoglobin: 9.1 g/dL — ABNORMAL LOW (ref 13.0–17.0)
MCH: 30.6 pg (ref 26.0–34.0)
MCHC: 31.7 g/dL (ref 30.0–36.0)
MCV: 96.6 fL (ref 80.0–100.0)
Platelets: 242 10*3/uL (ref 150–400)
RBC: 2.97 MIL/uL — ABNORMAL LOW (ref 4.22–5.81)
RDW: 17.7 % — ABNORMAL HIGH (ref 11.5–15.5)
WBC: 10.4 10*3/uL (ref 4.0–10.5)
nRBC: 0 % (ref 0.0–0.2)

## 2018-08-27 LAB — BASIC METABOLIC PANEL
Anion gap: 12 (ref 5–15)
BUN: 17 mg/dL (ref 6–20)
CO2: 19 mmol/L — ABNORMAL LOW (ref 22–32)
Calcium: 8.9 mg/dL (ref 8.9–10.3)
Chloride: 108 mmol/L (ref 98–111)
Creatinine, Ser: 1.35 mg/dL — ABNORMAL HIGH (ref 0.61–1.24)
GFR calc Af Amer: >60 mL/min (ref >60)
GFR calc non Af Amer: 57 mL/min — ABNORMAL LOW (ref >60)
Glucose, Bld: 111 mg/dL — ABNORMAL HIGH (ref 70–99)
Potassium: 3.9 mmol/L (ref 3.5–5.1)
Sodium: 139 mmol/L (ref 135–145)

## 2018-08-27 LAB — GLUCOSE, CAPILLARY: Glucose-Capillary: 119 mg/dL — ABNORMAL HIGH (ref 70–99)

## 2018-08-27 MED ORDER — AMLODIPINE BESYLATE 5 MG PO TABS
5.0000 mg | ORAL_TABLET | Freq: Every day | ORAL | Status: DC
  Administered 2018-08-27: 5 mg via ORAL
  Filled 2018-08-27: qty 1

## 2018-08-27 MED ORDER — DOCUSATE SODIUM 100 MG PO CAPS
100.0000 mg | ORAL_CAPSULE | Freq: Two times a day (BID) | ORAL | Status: DC
  Administered 2018-08-27: 100 mg via ORAL
  Filled 2018-08-27: qty 1

## 2018-08-27 MED ORDER — AMLODIPINE BESYLATE 5 MG PO TABS: 5.0000 mg | ORAL_TABLET | Freq: Every day | ORAL | 0 refills | Status: DC

## 2018-08-27 MED ORDER — POLYETHYLENE GLYCOL 3350 17 G PO PACK
17.0000 g | PACK | Freq: Every day | ORAL | Status: DC
  Administered 2018-08-27: 17 g via ORAL
  Filled 2018-08-27: qty 1

## 2018-08-27 NOTE — Progress Notes (Signed)
[facility] Progress Note  1 Day Post-Op  Subjective: CC-  No complaints this morning. Eating breakfast. Denies any pain from surgical site. No bleeding since surgery. Labs pending today. States that he has not had a BM in several days. Denies n/v.  Objective: Vital signs in last 24 hours: Temp:  [97.5 F (36.4 C)-98.3 F (36.8 C)] 98.3 F (36.8 C) (07/01 0734) Pulse Rate:  [60-67] 64 (07/01 0734) Resp:  [16-20] 16 (07/01 0734) BP: (118-155)/(84-95) 147/95 (07/01 0734) SpO2:  [94 %-100 %] 94 % (07/01 0734) Weight:  [93.5 kg] 93.5 kg (06/30 0959) Last BM Date: (PTA)  Intake/Output from previous day: 06/30 0701 - 07/01 0700 In: 1562.2 [I.V.:1262.2; Blood:300] Out: 50 [Blood:50] Intake/Output this shift: No intake/output data recorded.  PE: Gen:  Alert, NAD Pulm:  effort normal Abd: Soft, NT/ND Skin: warm and dry, pale Chest wall: wound clean, no purulent drainage or cellulitis, no bleeding, some snow remaining at base of wound     Lab Results:  Recent Labs    08/26/18 0223 08/26/18 1459  WBC 10.1 10.0  HGB 7.9*  7.9* 10.1*  HCT 24.7*  24.1* 30.8*  PLT 312 278   BMET Recent Labs    08/25/18 1512 08/26/18 0223  NA 140 142  K 4.0 4.4  CL 113* 113*  CO2 20* 20*  GLUCOSE 140* 90  BUN 23* 22*  CREATININE 1.59* 1.52*  CALCIUM 8.5* 8.9   PT/INR Recent Labs    08/26/18 0223  LABPROT 14.1  INR 1.1   CMP     Component Value Date/Time   NA 142 08/26/2018 0223   NA 140 05/28/2017 1617   K 4.4 08/26/2018 0223   CL 113 (H) 08/26/2018 0223   CO2 20 (L) 08/26/2018 0223   GLUCOSE 90 08/26/2018 0223   BUN 22 (H) 08/26/2018 0223   BUN 10 05/28/2017 1617   CREATININE 1.52 (H) 08/26/2018 0223   CREATININE 1.11 08/12/2015 1417   CALCIUM 8.9 08/26/2018 0223   PROT 6.4 (L) 08/21/2018 1539   PROT 7.6 05/28/2017 1617   ALBUMIN 2.4 (L) 08/21/2018 1539   ALBUMIN 4.3 05/28/2017 1617   AST 47 (H) 08/21/2018 1539   ALT 67 (H) 08/21/2018 1539   ALKPHOS 71 08/21/2018 1539   BILITOT 0.6 08/21/2018 1539   BILITOT 0.5 05/28/2017 1617   GFRNONAA 49 (L) 08/26/2018 0223   GFRNONAA 74 08/12/2015 1417   GFRAA 57 (L) 08/26/2018 0223   GFRAA 85 08/12/2015 1417   Lipase     Component Value Date/Time   LIPASE 41 12/07/2014 2004       Studies/Results: No results found.  Anti-infectives: Anti-infectives (From admission, onward)   Start     Dose/Rate Route Frequency Ordered Stop   08/26/18 1042  ciprofloxacin (CIPRO) 400 MG/200ML IVPB    Note to Pharmacy: Grace Blight   : cabinet override      08/26/18 1042 08/26/18 1105   08/26/18 1000  ceFAZolin (ANCEF) IVPB 2g/100 mL premix     2 g 200 mL/hr over 30 Minutes Intravenous  Once 08/26/18 0946     08/26/18 0930  ciprofloxacin (CIPRO) IVPB 400 mg  Status:  Discontinued     400 mg 200 mL/hr over 60 Minutes Intravenous On call to O.R. 08/26/18 0923 08/26/18 0946       Assessment/Plan Hx CVA-Plavix 08/14/2018 AMS/fall Alcohol abuse/alcohol withdrawal Old C5-C6 fractures Tobacco use Hx of bipolar disorder/Hx depression GERD/history of duodenal ulcers  R chest wall abscess POD#8  s/p I&D ofRight chest wall abscess status post fall, Dr. Luisa Hartornett, 06/23 Bleeding from chest wall wound POD#1 wound exploration, ligation of hemorrhaging vessel - Labs pending this morning. Wound looks good, no bleeding. Will ask WOC to place wound vac for home use. Home health DME and RN orders placed.  Miralax/colace for constipation. Mobilize. If hemoglobin stable patient may be discharged later today from surgical standpoint. I call and updated the patient's wife.  ID - ancef periop FEN - CM Diet VTE - SCDs Foley - none Contact - wife Ocie BobSharon Sundstrom [phone]    LOS: 1 day    Franne FortsBrooke A Meuth , [facility] 08/27/2018, 9:11 AM Pager: [phone] Mon-Thurs 7:00 am-4:30 pm Fri 7:00 am -11:30 AM Sat-Sun 7:00 am-11:30 am

## 2018-08-27 NOTE — Evaluation (Signed)
Physical Therapy Evaluation Patient Details Name: [name] [name] MRN: [id number] DOB: 09/22/1960 Today's Date: 08/27/2018   History of Present Illness  58 y.o. male presenting with persistent right axillary wound bleed and found to have hemoglobin of 7.0, down from 10.6 on 6/25. He was taken to the OR 08/26/18 and underwent wound exploration and ligation of hemorrhaging vessel. PMH is significant for Recent CVA (07/17/2018), vascular dementia, alcoholism, bipolar 1, encephalomalacia, HFrEF, HTN, and depression.    Clinical Impression  Pt admitted with above diagnosis. Pt currently with functional limitations due to the deficits listed below (see PT Problem List). PTA pt lived at home with his wife, utilizing RW for mobility mod I. He reports having frequent falls. On eval, he required min guard assist transfers and min to min guard assist ambulation 40 feet with RW. He demonstrates unsteady gait, decreased balance and decreased safety awareness. Pt will benefit from skilled PT to increase their independence and safety with mobility to allow discharge to the venue listed below.       Follow Up Recommendations Home health PT;Supervision/Assistance - 24 hour (Pt declining HHPT.)    Equipment Recommendations  None recommended by PT    Recommendations for Other Services       Precautions / Restrictions Precautions Precautions: Fall Precaution Comments: Pt reports frequent falls.      Mobility  Bed Mobility Overal bed mobility: Modified Independent       Supine to sit: Modified independent (Device/Increase time) Sit to supine: Modified independent (Device/Increase time)   General bed mobility comments: +rail, no assist needed  Transfers Overall transfer level: Needs assistance Equipment used: Rolling walker (2 wheeled) Transfers: Sit to/from Stand Sit to Stand: Min guard         General transfer comment: Cues for hand placement as pt pulls up with both hands on  RW  Ambulation/Gait Ambulation/Gait assistance: Min guard;Min assist Gait Distance (Feet): 40 Feet Assistive device: Rolling walker (2 wheeled) Gait Pattern/deviations: Step-through pattern;Ataxic Gait velocity: decreased Gait velocity interpretation: <1.31 ft/sec, indicative of household ambulator General Gait Details: min assist while turning to maintain balance. Unsteady gait with decreased safety awareness.  Stairs            Wheelchair Mobility    Modified Rankin (Stroke Patients Only)       Balance Overall balance assessment: Needs assistance Sitting-balance support: Feet supported;No upper extremity supported Sitting balance-Leahy Scale: Good     Standing balance support: Bilateral upper extremity supported;During functional activity Standing balance-Leahy Scale: Poor Standing balance comment: reliant on RW                             Pertinent Vitals/Pain Pain Assessment: No/denies pain    Home Living Family/patient expects to be discharged to:: Private residence Living Arrangements: Spouse/significant other Available Help at Discharge: Family Type of Home: House Home Access: Level entry     Home Layout: One level Home Equipment: Environmental consultant - 2 wheels      Prior Function Level of Independence: Independent with assistive device(s)         Comments: uses RW for mobility     Hand Dominance   Dominant Hand: Right    Extremity/Trunk Assessment   Upper Extremity Assessment Upper Extremity Assessment: Overall WFL for tasks assessed    Lower Extremity Assessment Lower Extremity Assessment: RLE deficits/detail;LLE deficits/detail RLE Deficits / Details: Legs are strong, but uncoordinated bil .  LLE Deficits / Details: legs  are strong, but uncoordinated bil.     Cervical / Trunk Assessment Cervical / Trunk Assessment: Normal  Communication   Communication: Other (comment)(slurred speech with facial fxs)  Cognition  Arousal/Alertness: Awake/alert Behavior During Therapy: Flat affect Overall Cognitive Status: History of cognitive impairments - at baseline                                 General Comments: Mildly impulsive and easily agitated. Decreased safety awareness.      General Comments      Exercises     Assessment/Plan    PT Assessment Patient needs continued PT services  PT Problem List Decreased activity tolerance;Decreased balance;Decreased mobility;Decreased coordination;Decreased cognition;Decreased knowledge of use of DME;Decreased safety awareness;Decreased knowledge of precautions       PT Treatment Interventions DME instruction;Gait training;Stair training;Functional mobility training;Therapeutic activities;Therapeutic exercise;Balance training;Neuromuscular re-education;Cognitive remediation;Patient/family education    PT Goals (Current goals can be found in the Care Plan section)  Acute Rehab PT Goals Patient Stated Goal: home PT Goal Formulation: Patient unable to participate in goal setting Time For Goal Achievement: 09/10/18 Potential to Achieve Goals: Good    Frequency Min 3X/week   Barriers to discharge        Co-evaluation               AM-PAC PT "6 Clicks" Mobility  Outcome Measure Help needed turning from your back to your side while in a flat bed without using bedrails?: None Help needed moving from lying on your back to sitting on the side of a flat bed without using bedrails?: None Help needed moving to and from a bed to a chair (including a wheelchair)?: A Little Help needed standing up from a chair using your arms (e.g., wheelchair or bedside chair)?: A Little Help needed to walk in hospital room?: A Little Help needed climbing 3-5 steps with a railing? : A Lot 6 Click Score: 19    End of Session Equipment Utilized During Treatment: Gait belt Activity Tolerance: Patient tolerated treatment well Patient left: in chair;with chair  alarm set;with call bell/phone within reach Nurse Communication: Mobility status PT Visit Diagnosis: Difficulty in walking, not elsewhere classified (R26.2);History of falling (Z91.81)    Time: 1050-1108 PT Time Calculation (min) (ACUTE ONLY): 18 min   Charges:   PT Evaluation $PT Eval Moderate Complexity: 1 Mod          Aida RaiderWendy Shanequa Whitenight, PT  Office # [phone] Pager [phone]   Ilda FoilGarrow, Aasim Restivo Rene 08/27/2018, 12:10 PM

## 2018-08-27 NOTE — Discharge Instructions (Signed)
Please follow up in [facility] Medicine clinic tomorrow morning    Negative Pressure Wound Therapy Home Guide Negative pressure wound therapy (NPWT) uses a sponge or foam-like material (dressing) placed on or inside the wound. The wound is then covered and sealed with a cover dressing that sticks to your skin (is adhesive). This keeps air out. A tube is attached to the cover dressing, and this tube connects to a small pump. The pump sucks fluid and germs from the wound. NPWT helps to increase blood flow to the wound and heal it from the inside. What are the risks? NPWT is usually safe to use. However, problems can occur, including:  Skin irritation from the dressing adhesive.  Bleeding.  Infection.  Dehydration. Wounds with large amounts of drainage can cause excessive fluid loss.  Pain. Supplies needed:  A disposable garbage bag.  Soap and water, or hand sanitizer.  Wound cleanser or salt-water solution (saline).  New sponge and cover dressing.  Protective clothing.  Gauze pad.  Vinyl gloves.  Tape.  Skin protectant. This may be a wipe, film, or spray.  Clean or germ-free (sterile) scissors.  Eye protection. How to change your dressing Prepare to change your dressing  1. If told by your health care provider, take pain medicine 30 minutes before changing the dressing. 2. Wash your hands with soap and water. Dry your hands with a clean towel. If soap and water are not available, use hand sanitizer. 3. Set up a clean station for wound care. 4. Open the dressing package so that the sponge dressing remains on the inside of the package. 5. Wear gloves, protective clothing, and eye protection. Remove old dressing  1. Turn off the pump and disconnect the tubing from the dressing. 2. Carefully remove the adhesive cover dressing in the direction of your hair growth. 3. Remove the sponge dressing that is inside the wound. If the sponge sticks, use a wound cleanser or saline  solution to wet the sponge and help it come off more easily. 4. Throw the old sponge and cover dressing supplies into the garbage bag. 5. Remove your gloves by grabbing the cuff and turning the glove inside out. Place the gloves in the trash immediately. 6. Wash your hands with soap and water. Dry your hands with a clean towel. If soap and water are not available, use hand sanitizer. Clean your wound  Wear gloves, protective clothing, and eye protection. Follow your health care provider's instructions on how to clean your wound. You may be told to: 1. Clean the wound using a saline solution or a wound cleanser and a clean gauze pad. 2. Pat the wound dry with a gauze pad. Do not rub the wound. 3. Throw the gauze pad into the garbage bag. 4. Remove your gloves by grabbing the cuff and turning the glove inside out. Place the gloves in the trash immediately. 5. Wash your hands with soap and water. Dry your hands with a clean towel. If soap and water are not available, use hand sanitizer. Apply new dressing  Wear gloves, protective clothing, and eye protection. 1. If told by your health care provider, apply a skin protectant to any skin that will be exposed to adhesive. Let the skin protectant dry. 2. Cut a piece of new sponge dressing and put it on or in the wound. 3. Using clean scissors, cut a nickel-sized hole in the new cover dressing. 4. Apply the cover dressing. 5. Attach the suction tube over the hole in  the cover dressing. 6. Take off your gloves. Put them in the plastic bag with the old dressing. Tie the bag shut and throw it away. 7. Wash your hands with soap and water. Dry your hands with a clean towel. If soap and water are not available, use hand sanitizer. 8. Turn the pump back on. The sponge dressing should collapse. Do not change the settings on the machine without talking to a health care provider. 9. Replace the container in the pump that collects fluid if it is full. Replace  the container per the manufacturer's instructions or at least once a week, even if it is not full. General tips and recommendations If the alarm sounds:  Stay calm.  Do not turn off the pump or do anything with the dressing.  Reasons the alarm may go off: ? The battery is low. Change the battery or plug the device into electrical power. ? The dressing has a leak. Find the leak and put tape over the leak. ? The fluid collection container is full. Change the fluid container.  Call your health care provider right away if you cannot fix the problem.  Explain to your health care provider what is happening. Follow his or her instructions. General instructions  Do not turn off the pump unless told to do so by your health care provider.  Do not turn off the pump for more than 2 hours. If the pump is off for more than 2 hours, the dressing will need to be changed.  If your health care provider says it is okay to shower: ? Do not take the pump into the shower. ? Make sure the wound dressing is protected and sealed. The wound dressing must stay dry.  Check frequently that the machine indicates that therapy is on and that all clamps are open.  Do not use over-the-counter medicated or antiseptic creams, sprays, liquids, or dressings unless your health care provider approves. Contact a health care provider if:  You have new pain.  You develop irritation, a rash, or itching around the wound or dressing.  You see new black or yellow tissue in your wound.  The dressing changes are painful or cause bleeding.  The pump has been off for more than 2 hours, and you do not know how to change the dressing.  The pump alarm goes off, and you do not know what to do. Get help right away if:  You have a lot of bleeding.  The wound breaks open.  You have severe pain.  You have signs of infection, such as: ? More redness, swelling, or pain. ? More fluid or blood. ? Warmth. ? Pus or a bad  smell. ? Red streaks leading from the wound. ? A fever.  You see a sudden change in the color or texture of the drainage.  You have signs of dehydration, such as: ? Little or no tears, urine, or sweat. ? Muscle cramps. ? Very dry mouth. ? Headache. ? Dizziness. Summary  Negative pressure wound therapy (NPWT) is a device that helps your wound heal.  Set up a clean station for wound care. Your health care provider will tell you what supplies to use.  Follow your health care provider's instructions on how to clean your wound and how to change the dressing.  Contact a health care provider if you have new pain, an irritation, or a rash, or if the alarm goes off and you do not know what to do.  Get help  right away if you have a lot of bleeding, your wound breaks open, or you have severe pain. Also, get help if you have signs of infection. This information is not intended to replace advice given to you by your health care provider. Make sure you discuss any questions you have with your health care provider. Document Released: 05/07/2011 Document Revised: 06/06/2018 Document Reviewed: 05/02/2018 Elsevier Patient Education  2020 [employer]

## 2018-08-27 NOTE — Evaluation (Addendum)
Occupational Therapy Evaluation Patient Details Name: [name] [name] MRN: [id number] DOB: 01/15/1961 Today's Date: 08/27/2018    History of Present Illness 58 y.o. male presenting with persistent right axillary wound bleed and found to have hemoglobin of 7.0, down from 10.6 on 6/25. He was taken to the OR 08/26/18 and underwent wound exploration and ligation of hemorrhaging vessel. PMH is significant for Recent CVA (07/17/2018), vascular dementia, alcoholism, bipolar 1, encephalomalacia, HFrEF, HTN, and depression.   Clinical Impression   This 58 y/o male presents with the above. PTA pt reports mod independent with ADL and functional mobility using RW. Pt performing room level functional mobility using RW this session with minA, intermittent modA due to decreased safety awareness and impulsivity with RW use. He currently requires minA for toileting, standing grooming and LB ADL, setup/supervision for seated UB ADL. [name] noted up to low 140s with activity this session. He will benefit from continued acute OT services and [facility] to maximize his safety and independence with ADL and mobility after discharge. Will follow.     Follow Up Recommendations  Home health OT;Supervision/Assistance - 24 hour    Equipment Recommendations  3 in 1 bedside commode(for use in shower)           Precautions / Restrictions Precautions Precautions: Fall Precaution Comments: Pt reports frequent falls. Restrictions Weight Bearing Restrictions: No      Mobility Bed Mobility Overal bed mobility: Modified Independent       Supine to sit: Modified independent (Device/Increase time) Sit to supine: Modified independent (Device/Increase time)   General bed mobility comments: +rail, no assist needed; cues to transition to full sitting position and to scoot towards EOB  Transfers Overall transfer level: Needs assistance Equipment used: Rolling walker (2  wheeled) Transfers: Sit to/from Stand Sit to Stand: Min guard         General transfer comment: Cues for hand placement as pt pulls up with both hands on RW    Balance Overall balance assessment: Needs assistance Sitting-balance support: Feet supported;No upper extremity supported Sitting balance-Leahy Scale: Good     Standing balance support: Bilateral upper extremity supported;During functional activity;Single extremity supported Standing balance-Leahy Scale: Poor Standing balance comment: reliant on RW; single UE support                           ADL either performed or assessed with clinical judgement   ADL Overall ADL's : Needs assistance/impaired Eating/Feeding: Set up;Sitting   Grooming: Minimal assistance;Standing;Oral care;Set up;Sitting;Wash/dry face   Upper Body Bathing: Min guard;Sitting   Lower Body Bathing: Minimal assistance;Sitting/lateral leans;Sit to/from stand;Cueing for safety   Upper Body Dressing : Set up;Min guard;Sitting   Lower Body Dressing: Minimal assistance;Sit to/from stand;Cueing for safety   Toilet Transfer: Minimal assistance;Ambulation;RW Toilet Transfer Details (indicate cue type and reason): cueing for safety; pt standing at toilet in bathroom to void bladder Toileting- Clothing Manipulation and Hygiene: Minimal assistance;Sit to/from stand Toileting - Clothing Manipulation Details (indicate cue type and reason): for gown management; balance     Functional mobility during ADLs: Minimal assistance;Rolling walker General ADL Comments: pt with decreased safety awareness and impulsive with RW use                         Pertinent Vitals/Pain Pain Assessment: No/denies pain     Hand Dominance Right   Extremity/Trunk Assessment Upper Extremity Assessment Upper  Extremity Assessment: Generalized weakness   Lower Extremity Assessment Lower Extremity Assessment: Defer to PT evaluation RLE Deficits / Details: Legs  are strong, but uncoordinated bil .  LLE Deficits / Details: legs are strong, but uncoordinated bil.    Cervical / Trunk Assessment Cervical / Trunk Assessment: Normal   Communication Communication Communication: Other (comment)(slurred speech with facial fxs)   Cognition Arousal/Alertness: Awake/alert Behavior During Therapy: Flat affect Overall Cognitive Status: History of cognitive impairments - at baseline                                 General Comments: Mildly impulsive and easily agitated. Decreased safety awareness.   General Comments  HR up to low 140s with activity    Exercises     Shoulder Instructions      Home Living Family/patient expects to be discharged to:: Private residence Living Arrangements: Spouse/significant other Available Help at Discharge: Family Type of Home: House Home Access: Level entry     Home Layout: One level     Bathroom Shower/Tub: Teacher, early years/pre: Standard     Home Equipment: Environmental consultant - 2 wheels          Prior Functioning/Environment Level of Independence: Independent with assistive device(s)        Comments: uses RW for mobility        OT Problem List: Decreased strength;Decreased activity tolerance;Decreased range of motion;Impaired balance (sitting and/or standing);Decreased cognition;Decreased safety awareness;Decreased knowledge of use of DME or AE;Decreased coordination      OT Treatment/Interventions: Self-care/ADL training;Therapeutic exercise;Neuromuscular education;DME and/or AE instruction;Therapeutic activities;Patient/family education;Cognitive remediation/compensation;Balance training    OT Goals(Current goals can be found in the care plan section) Acute Rehab OT Goals Patient Stated Goal: home OT Goal Formulation: With patient Time For Goal Achievement: 09/10/18 Potential to Achieve Goals: Good  OT Frequency: Min 2X/week   Barriers to D/C:            Co-evaluation               AM-PAC OT "6 Clicks" Daily Activity     Outcome Measure Help from another person eating meals?: None Help from another person taking care of personal grooming?: A Little Help from another person toileting, which includes using toliet, bedpan, or urinal?: A Little Help from another person bathing (including washing, rinsing, drying)?: A Little Help from another person to put on and taking off regular upper body clothing?: A Little Help from another person to put on and taking off regular lower body clothing?: A Lot 6 Click Score: 18   End of Session Equipment Utilized During Treatment: Gait belt;Rolling walker Nurse Communication: Mobility status  Activity Tolerance: Patient tolerated treatment well Patient left: in bed;with call bell/phone within reach;with bed alarm set  OT Visit Diagnosis: Unsteadiness on feet (R26.81);Other symptoms and signs involving cognitive function                Time: 3818-2993 OT Time Calculation (min): 30 min Charges:  OT General Charges $OT Visit: 1 Visit OT Evaluation $OT Eval Moderate Complexity: 1 Mod OT Treatments $Self Care/Home Management : 8-22 mins  Lou Cal, OT Supplemental Rehabilitation Services Pager [phone] Office [phone]   Raymondo Band 08/27/2018, 1:07 PM

## 2018-08-27 NOTE — Consult Note (Signed)
[place] Nurse wound consult note Reason for Consult:NPWT (VAC) dressing application to right axilla wound.   Anticipate discharge today. No home unit yet, so will apply facility VAC.  Bedside RN Valentino Nose understands to connect patient to home unit prior to leaving hospital.   Wound type:Surgical Pressure Injury POA: NA Measurement: 8 cm x 4.3 cm x 4.6 cm  Wound OVZ:CHYIF red Drainage (amount, consistency, odor) no bleeding noted.  Periwound:intact Dressing procedure/placement/frequency:  Cleanse wound to right axilla with NS. Apply black foam to wound bed. Cover with drape.  Seal immediately achieved at 125.  Will change M/W/F with HH.  [place] team will follow through admission.  Domenic Moras MSN, RN, FNP-BC CWON Wound, Ostomy, Continence Nurse Pager [phone]

## 2018-08-27 NOTE — TOC Transition Note (Signed)
Transition of Care [facility]) - CM/SW Discharge Note   Patient Details  Name: [name] [name] MRN: [id number] Date of Birth: 14-Jun-1960  Transition of Care [facility]) CM/SW Contact:  Zenon Mayo, RN Phone Number: 08/27/2018, 2:40 PM   Clinical Narrative:    Patient is for dc today, he is active with [facility] home care is old name) for [facility], [place], [place].  He will be going home with a negative pressure wound vac with [employer], referral made to [facility].  The negative pressure wound vac will be brought up to patient room and applied on by the RN before patient leaves.  NCM spoke with Hoyle Sauer with [facility] and she is aware the change to wound vac is on MWF.  Soc will begin 24-48 hrs post dc.    Final next level of care: [place] Barriers to Discharge: No Barriers Identified   Patient Goals and CMS Choice Patient states their goals for this hospitalization and ongoing recovery are:: to go home and get better CMS Medicare.gov Compare Post Acute Care list provided to:: Patient Choice offered to / list presented to : Patient  Discharge Placement                       Discharge Plan and Services                DME Arranged: Negative pressure wound device DME Agency: [employer] Date DME Agency Contacted: 08/27/18 Time DME Agency Contacted: 1440 Representative spoke with at DME Agency: [place]: RN, PT, Speech Therapy HH Agency: [place] Date [place]: 08/27/18 Time [place]: 1440 Representative spoke with at [place]: [place] ([place]) Interventions     Readmission Risk Interventions No flowsheet data found.

## 2018-08-28 ENCOUNTER — Other Ambulatory Visit (INDEPENDENT_AMBULATORY_CARE_PROVIDER_SITE_OTHER): Payer: Medicare Other

## 2018-08-28 ENCOUNTER — Other Ambulatory Visit: Payer: Self-pay

## 2018-08-28 DIAGNOSIS — I63512 Cerebral infarction due to unspecified occlusion or stenosis of left middle cerebral artery: Secondary | ICD-10-CM

## 2018-08-28 DIAGNOSIS — R634 Abnormal weight loss: Secondary | ICD-10-CM

## 2018-08-28 DIAGNOSIS — R4189 Other symptoms and signs involving cognitive functions and awareness: Secondary | ICD-10-CM

## 2018-08-28 LAB — TYPE AND SCREEN
ABO/RH(D): O POS
Antibody Screen: NEGATIVE
Unit division: 0
Unit division: 0
Unit division: 0

## 2018-08-28 LAB — BPAM RBC
Blood Product Expiration Date: 202007292359
Blood Product Expiration Date: 202007302359
Blood Product Expiration Date: 202007302359
ISSUE DATE / TIME: 202006292050
ISSUE DATE / TIME: 202006300949
ISSUE DATE / TIME: 202006300949
Unit Type and Rh: 5100
Unit Type and Rh: 5100
Unit Type and Rh: 5100

## 2018-08-29 LAB — CBC WITH DIFFERENTIAL/PLATELET
Basophils Absolute: 0.1 10*3/uL (ref 0.0–0.2)
Basos: 1 %
EOS (ABSOLUTE): 0.1 10*3/uL (ref 0.0–0.4)
Eos: 1 %
Hematocrit: 26.3 % — ABNORMAL LOW (ref 37.5–51.0)
Hemoglobin: 8.8 g/dL — ABNORMAL LOW (ref 13.0–17.7)
Immature Grans (Abs): 0.2 10*3/uL — ABNORMAL HIGH (ref 0.0–0.1)
Immature Granulocytes: 2 %
Lymphocytes Absolute: 2.9 10*3/uL (ref 0.7–3.1)
Lymphs: 32 %
MCH: 31.8 pg (ref 26.6–33.0)
MCHC: 33.5 g/dL (ref 31.5–35.7)
MCV: 95 fL (ref 79–97)
Monocytes Absolute: 0.5 10*3/uL (ref 0.1–0.9)
Monocytes: 6 %
Neutrophils Absolute: 5.4 10*3/uL (ref 1.4–7.0)
Neutrophils: 58 %
Platelets: 287 10*3/uL (ref 150–450)
RBC: 2.77 x10E6/uL — ABNORMAL LOW (ref 4.14–5.80)
RDW: 16 % — ABNORMAL HIGH (ref 11.6–15.4)
WBC: 9.1 10*3/uL (ref 3.4–10.8)

## 2018-08-29 LAB — HEMOGLOBIN A1C
Est. average glucose Bld gHb Est-mCnc: 108 mg/dL
Hgb A1c MFr Bld: 5.4 % (ref 4.8–5.6)

## 2018-08-29 LAB — LIPID PANEL
Chol/HDL Ratio: 2.5 ratio (ref 0.0–5.0)
Cholesterol, Total: 119 mg/dL (ref 100–199)
HDL: 47 mg/dL (ref >39)
LDL Calculated: 53 mg/dL (ref 0–99)
Triglycerides: 97 mg/dL (ref 0–149)
VLDL Cholesterol Cal: 19 mg/dL (ref 5–40)

## 2018-08-29 LAB — TSH: TSH: 3.11 u[IU]/mL (ref 0.450–4.500)

## 2018-08-29 LAB — HIV ANTIBODY (ROUTINE TESTING W REFLEX): HIV Screen 4th Generation wRfx: NONREACTIVE

## 2018-09-08 ENCOUNTER — Other Ambulatory Visit: Payer: Self-pay

## 2018-09-08 ENCOUNTER — Ambulatory Visit (INDEPENDENT_AMBULATORY_CARE_PROVIDER_SITE_OTHER): Payer: Medicare Other | Admitting: Family Medicine

## 2018-09-08 ENCOUNTER — Encounter: Payer: Self-pay | Admitting: Family Medicine

## 2018-09-08 VITALS — BP 116/90 | HR 101

## 2018-09-08 DIAGNOSIS — S21101D Unspecified open wound of right front wall of thorax without penetration into thoracic cavity, subsequent encounter: Secondary | ICD-10-CM

## 2018-09-08 DIAGNOSIS — I63512 Cerebral infarction due to unspecified occlusion or stenosis of left middle cerebral artery: Secondary | ICD-10-CM

## 2018-09-08 DIAGNOSIS — Z7409 Other reduced mobility: Secondary | ICD-10-CM

## 2018-09-08 DIAGNOSIS — Z789 Other specified health status: Secondary | ICD-10-CM

## 2018-09-14 ENCOUNTER — Encounter (HOSPITAL_COMMUNITY): Payer: Self-pay | Admitting: Pharmacy Technician

## 2018-09-14 ENCOUNTER — Emergency Department (HOSPITAL_COMMUNITY): Payer: Medicare Other

## 2018-09-14 ENCOUNTER — Emergency Department (HOSPITAL_COMMUNITY)
Admission: EM | Admit: 2018-09-14 | Discharge: 2018-09-14 | Disposition: A | Discharge status: Home / Self Care | Payer: Medicare Other | Attending: Emergency Medicine | Admitting: Emergency Medicine

## 2018-09-14 DIAGNOSIS — S0990XA Unspecified injury of head, initial encounter: Secondary | ICD-10-CM | POA: Insufficient documentation

## 2018-09-14 DIAGNOSIS — F319 Bipolar disorder, unspecified: Secondary | ICD-10-CM | POA: Insufficient documentation

## 2018-09-14 DIAGNOSIS — Y92012 Bathroom of single-family (private) house as the place of occurrence of the external cause: Secondary | ICD-10-CM | POA: Diagnosis not present

## 2018-09-14 DIAGNOSIS — I10 Essential (primary) hypertension: Secondary | ICD-10-CM | POA: Diagnosis not present

## 2018-09-14 DIAGNOSIS — W1830XA Fall on same level, unspecified, initial encounter: Secondary | ICD-10-CM | POA: Diagnosis not present

## 2018-09-14 DIAGNOSIS — Z7901 Long term (current) use of anticoagulants: Secondary | ICD-10-CM | POA: Diagnosis not present

## 2018-09-14 DIAGNOSIS — F17210 Nicotine dependence, cigarettes, uncomplicated: Secondary | ICD-10-CM | POA: Insufficient documentation

## 2018-09-14 DIAGNOSIS — Y999 Unspecified external cause status: Secondary | ICD-10-CM | POA: Insufficient documentation

## 2018-09-14 DIAGNOSIS — W19XXXA Unspecified fall, initial encounter: Secondary | ICD-10-CM

## 2018-09-14 DIAGNOSIS — S0181XA Laceration without foreign body of other part of head, initial encounter: Secondary | ICD-10-CM | POA: Diagnosis not present

## 2018-09-14 DIAGNOSIS — Z79899 Other long term (current) drug therapy: Secondary | ICD-10-CM | POA: Diagnosis not present

## 2018-09-14 DIAGNOSIS — F0390 Unspecified dementia without behavioral disturbance: Secondary | ICD-10-CM | POA: Diagnosis not present

## 2018-09-14 DIAGNOSIS — Y93E1 Activity, personal bathing and showering: Secondary | ICD-10-CM | POA: Diagnosis not present

## 2018-09-14 LAB — CBG MONITORING, ED: Glucose-Capillary: 161 mg/dL — ABNORMAL HIGH (ref 70–99)

## 2018-09-14 MED ORDER — LIDOCAINE-EPINEPHRINE (PF) 2 %-1:200000 IJ SOLN
20.0000 mL | Freq: Once | INTRAMUSCULAR | Status: AC
  Administered 2018-09-14: 20 mL
  Filled 2018-09-14: qty 20

## 2018-09-14 NOTE — ED Provider Notes (Signed)
[facility] EMERGENCY DEPARTMENT Provider Note   CSN: [id number] Arrival date & time: 09/14/18  1033     History   Chief Complaint Chief Complaint  Patient presents with   Fall    HPI [name] [name] is a 58 y.o. male.  He has a prior history of stroke and alcohol abuse.  He is presenting by ambulance after having a fall at home getting out of bath.  He denies any loss consciousness.  He denies any complaints.  EMS bandaged his wounds and put him in a c-collar.  Patient denies any blurry vision numbness weakness chest pain shortness of breath abdominal pain.  He says he has balance issues.  He is on anticoagulation.     The history is provided by the patient and the EMS personnel.  Fall This is a new problem. The current episode started 1 to 2 hours ago. The problem has not changed since onset.Associated symptoms include headaches. Pertinent negatives include no chest pain, no abdominal pain and no shortness of breath. Nothing aggravates the symptoms. Nothing relieves the symptoms. He has tried nothing for the symptoms. The treatment provided no relief.    Past Medical History:  Diagnosis Date   Alcohol abuse    ANEMIA, FOLIC ACID DEFICIENCY 08/02/2009   Qualifier: Diagnosis of  By: Sharen Hones  MD, Wynona Canes     Arthritis    left knee   Bipolar disorder (HCC)    Cerebral infarct, left corona radiata (HCC) 07/17/2018   Dementia (HCC), vascular 07/17/2018   Depression    DEPRESSION, MAJOR, RECURRENT 04/25/2006   Qualifier: Diagnosis of  By: Seleta Rhymes MD, Mark     Duodenal ulcer 12/06/2014   Qualifier: Diagnosis of  By: Seleta Rhymes MD, Wilford Sports, bilateral frontal, traumatic 02/27/1995   Bilateral traumatic encephalomalacia   Erectile dysfunction 02/21/2018   Fall    Gastrointestinal hemorrhage associated with duodenal ulcer    GERD (gastroesophageal reflux disease)    Hepatic encephalopathy (HCC), possible history of 09/03/2010   Hepatic  steatosis 06/09/2010   Diagnosed by U/S 05/12/10. Likely secondary to chronic ETOH abuse. Will continue to follow.     History of traumatic brain injury    Hyperkalemia 12/08/2014   Hypertension    Left leg weakness 07/27/2015   Orbital floor (blow-out) closed fracture (HCC) 05/25/2017   Left orbit, stable.  No surgical intervention required.   Pneumonia    Pure hypercholesterolemia 07/25/2018   Stroke [facility])    a few spanning from 1997 to last year   Tobacco dependence     Patient Active Problem List   Diagnosis Date Noted   Hemorrhage from open wound of right chest wall    Symptomatic anemia    Anemia due to blood loss, acute 08/25/2018   Confusion    Chest wall abscess    Closed fracture of cervical vertebra (HCC)    Facial bones, closed fracture (HCC)    Hyponatremia    AMS (altered mental status) 08/15/2018   Left ventricular systolic dysfunction 08/08/2018   Weight loss, non-intentional 07/25/2018   Pure hypercholesterolemia 07/25/2018   Visual impairment 07/25/2018   Renal mass, right 07/25/2018   Impaired mobility and ADLs 07/25/2018   Depression    Frequent falls 07/17/2018   Dementia (HCC), vascular 07/17/2018   Cerebral infarct, left corona radiata (HCC) 07/17/2018   Chronic pain of left knee 07/30/2017   Chronic pain of right knee 02/13/2016   History of stroke  Total incontinence    Essential hypertension    H/O: duodenal ulcer 12/06/2014   Abnormality of gait 07/28/2009   Alcoholism ([place]) 07/11/2009   Bipolar 1 disorder, mixed, moderate ([place]) 06/27/2006   GOUT, CHRONIC 04/25/2006   OBESITY, NOS 04/25/2006   TOBACCO DEPENDENCE 04/25/2006   History of traumatic brain injury 02/27/1995   Encephalomalacia, bilateral frontal, traumatic 02/27/1995    Past Surgical History:  Procedure Laterality Date   COLONOSCOPY     ESOPHAGOGASTRODUODENOSCOPY (EGD) WITH PROPOFOL Left 12/09/2014   Procedure:  ESOPHAGOGASTRODUODENOSCOPY (EGD) WITH PROPOFOL;  Surgeon: Ronald Lobo, MD;  Location: [facility] ENDOSCOPY;  Service: Endoscopy;  Laterality: Left;   EVISCERATION Right 11/06/2017   Procedure: EVISCERATION REPAIR RIGHT EYE;  Surgeon: Jalene Mullet, MD;  Location: [place];  Service: Ophthalmology;  Laterality: Right;   EYE SURGERY     eye lid was split   IRRIGATION AND DEBRIDEMENT ABSCESS N/A 08/19/2018   Procedure: INCISION AND DRAINAGE OF CHEST WALL ABSCESS;  Surgeon: Erroll Luna, MD;  Location: [place];  Service: General;  Laterality: N/A;   IRRIGATION AND DEBRIDEMENT ABSCESS Right 08/26/2018   Procedure: WOUND HEMOSTASIS FOR RIGHT AXILLARY WOUND/CHEST WALL WOUND;  Surgeon: Rolm Bookbinder, MD;  Location: [place];  Service: General;  Laterality: Right;        Home Medications    Prior to Admission medications   Medication Sig Start Date End Date Taking? Authorizing Provider  acetaminophen (TYLENOL) 325 MG tablet Take 650 mg by mouth every 6 (six) hours as needed for mild pain.    [provider]  allopurinol (ZYLOPRIM) 300 MG tablet TAKE 1 TABLET BY MOUTH EVERY DAY Patient taking differently: Take 300 mg by mouth daily.  08/22/18   Sherene Sires, DO  amLODipine (NORVASC) 5 MG tablet Take 1 tablet (5 mg total) by mouth daily. 08/28/18   Zenia Resides, MD  atorvastatin (LIPITOR) 80 MG tablet Take 1 tablet (80 mg total) by mouth daily. 07/25/18   McDiarmid, Blane Ohara, MD  clopidogrel (PLAVIX) 75 MG tablet Take 1 tablet (75 mg total) by mouth daily. 07/25/18   McDiarmid, Blane Ohara, MD  diclofenac sodium (VOLTAREN) 1 % GEL Apply 2 g topically 4 (four) times daily. 07/25/18   McDiarmid, Blane Ohara, MD  erythromycin ophthalmic ointment Place 1 application into the right eye 3 (three) times daily. 08/12/18   [provider]  FLUoxetine (PROZAC) 40 MG capsule Take 40 mg by mouth at bedtime.    [provider]  folic acid (FOLVITE) 1 MG tablet TAKE 1 TABLET (1 MG TOTAL) BY MOUTH  DAILY. Patient taking differently: Take 1 mg by mouth daily.  02/24/18   Sherene Sires, DO  lactulose (CHRONULAC) 10 GM/15ML solution Take 45 mLs (30 g total) by mouth daily as needed for mild constipation. 02/24/18   Bland, Scott, DO  NICOTINE STEP 1 21 MG/24HR patch PLACE 1 PATCH ONTO THE SKIN DAILY. Patient taking differently: Place 21 mg onto the skin daily.  05/09/17   Nicolette Bang, DO  omega-3 acid ethyl esters (LOVAZA) 1 g capsule TAKE 1 CAPSULE (1 G TOTAL) BY MOUTH DAILY. Patient taking differently: Take 1 g by mouth daily. TAKE 1 CAPSULE (1 G TOTAL) BY MOUTH DAILY. 02/24/18   Sherene Sires, DO  pantoprazole (PROTONIX) 40 MG tablet Take 1 tablet (40 mg total) by mouth daily. 10/21/17   Sherene Sires, DO    Family History No family history on file.  Social History Social History   Tobacco Use  Smoking status: Current Every Day Smoker    Packs/day: 0.50    Years: 30.00    Pack years: 15.00    Types: Cigarettes   Smokeless tobacco: Never Used   Tobacco comment: does not want to quitt right now  Substance Use Topics   Alcohol use: Yes    Alcohol/week: 14.0 standard drinks    Types: 14 Cans of beer per week    Comment: " every friday I get a 5th "   Drug use: No     Allergies   Ace inhibitors and Penicillins   Review of Systems Review of Systems  Constitutional: Negative for fever.  HENT: Negative for sore throat.   Eyes: Negative for visual disturbance.  Respiratory: Negative for shortness of breath.   Cardiovascular: Negative for chest pain.  Gastrointestinal: Negative for abdominal pain.  Genitourinary: Negative for dysuria.  Musculoskeletal: Negative for neck pain.  Skin: Positive for wound. Negative for rash.  Neurological: Positive for headaches.     Physical Exam Updated Vital Signs BP (!) 141/105    Pulse 90    Temp 98.1 F (36.7 C) (Oral)    Resp 14    Ht 6' (1.829 m)    Wt 86.2 kg    SpO2 100%    BMI 25.77 kg/m   Physical  Exam Vitals signs and nursing note reviewed.  Constitutional:      Appearance: He is well-developed.  HENT:     Head: Normocephalic.     Comments: Patient has approximately 5 cm irregular laceration above his right eye. Eyes:     Conjunctiva/sclera: Conjunctivae normal.  Neck:     Musculoskeletal: Neck supple.  Cardiovascular:     Rate and Rhythm: Normal rate and regular rhythm.     Heart sounds: No murmur.  Pulmonary:     Effort: Pulmonary effort is normal. No respiratory distress.     Breath sounds: Normal breath sounds.  Abdominal:     Palpations: Abdomen is soft.     Tenderness: There is no abdominal tenderness.  Musculoskeletal: Normal range of motion.        General: No deformity or signs of injury.  Skin:    General: Skin is warm and dry.     Capillary Refill: Capillary refill takes less than 2 seconds.  Neurological:     General: No focal deficit present.     Mental Status: He is alert. Mental status is at baseline.      ED Treatments / Results  Labs (all labs ordered are listed, but only abnormal results are displayed) Labs Reviewed  CBG MONITORING, ED - Abnormal; Notable for the following components:      Result Value   Glucose-Capillary 161 (*)    All other components within normal limits    EKG None  Radiology Ct Head Wo Contrast  Result Date: 09/14/2018 CLINICAL DATA:  Head injury after fall.  No loss of consciousness. EXAM: CT HEAD WITHOUT CONTRAST CT CERVICAL SPINE WITHOUT CONTRAST TECHNIQUE: Multidetector CT imaging of the head and cervical spine was performed following the standard protocol without intravenous contrast. Multiplanar CT image reconstructions of the cervical spine were also generated. COMPARISON:  CT scan of August 15, 2018. FINDINGS: CT HEAD FINDINGS Brain: Mild diffuse cortical atrophy is noted. Mild chronic ischemic white matter disease is noted. No mass effect or midline shift is noted. Ventricular size is within normal limits. There is  no evidence of mass lesion, hemorrhage or acute infarction. Vascular: No hyperdense vessel  or unexpected calcification. Skull: Normal. Negative for fracture or focal lesion. Sinuses/Orbits: No acute finding. Other: Small right frontal scalp hematoma is noted. CT CERVICAL SPINE FINDINGS Alignment: Normal. Skull base and vertebrae: Old fracture involving C6 posterior spinous process is noted. Old fracture involving the right transverse foramina of C4 is noted. No acute fracture is noted. Soft tissues and spinal canal: No prevertebral fluid or swelling. No visible canal hematoma. Disc levels: Mild degenerative disc disease is noted at C5-6 and C6-7 with anterior osteophyte formation. Upper chest: Negative. Other: None. IMPRESSION: Small right frontal scalp hematoma. Mild diffuse cortical atrophy. Mild chronic ischemic white matter disease. No acute intracranial abnormality seen. Mild multilevel degenerative disc disease is noted. Old fractures are seen involving the C6 posterior spinous process and C4 right transverse foramina. No acute fracture or spondylolisthesis is noted. Electronically Signed   By: Lupita RaiderJames  Green Jr M.D.   On: 09/14/2018 11:51   Ct Cervical Spine Wo Contrast  Result Date: 09/14/2018 CLINICAL DATA:  Head injury after fall.  No loss of consciousness. EXAM: CT HEAD WITHOUT CONTRAST CT CERVICAL SPINE WITHOUT CONTRAST TECHNIQUE: Multidetector CT imaging of the head and cervical spine was performed following the standard protocol without intravenous contrast. Multiplanar CT image reconstructions of the cervical spine were also generated. COMPARISON:  CT scan of August 15, 2018. FINDINGS: CT HEAD FINDINGS Brain: Mild diffuse cortical atrophy is noted. Mild chronic ischemic white matter disease is noted. No mass effect or midline shift is noted. Ventricular size is within normal limits. There is no evidence of mass lesion, hemorrhage or acute infarction. Vascular: No hyperdense vessel or unexpected  calcification. Skull: Normal. Negative for fracture or focal lesion. Sinuses/Orbits: No acute finding. Other: Small right frontal scalp hematoma is noted. CT CERVICAL SPINE FINDINGS Alignment: Normal. Skull base and vertebrae: Old fracture involving C6 posterior spinous process is noted. Old fracture involving the right transverse foramina of C4 is noted. No acute fracture is noted. Soft tissues and spinal canal: No prevertebral fluid or swelling. No visible canal hematoma. Disc levels: Mild degenerative disc disease is noted at C5-6 and C6-7 with anterior osteophyte formation. Upper chest: Negative. Other: None. IMPRESSION: Small right frontal scalp hematoma. Mild diffuse cortical atrophy. Mild chronic ischemic white matter disease. No acute intracranial abnormality seen. Mild multilevel degenerative disc disease is noted. Old fractures are seen involving the C6 posterior spinous process and C4 right transverse foramina. No acute fracture or spondylolisthesis is noted. Electronically Signed   By: Lupita RaiderJames  Green Jr M.D.   On: 09/14/2018 11:51    Procedures .Marland Kitchen.Laceration Repair  Date/Time: 09/14/2018 11:15 AM Performed by: Terrilee FilesButler, Victorine Mcnee C, MD Authorized by: Terrilee FilesButler, Larenz Frasier C, MD   Consent:    Consent obtained:  Verbal   Consent given by:  Patient   Risks discussed:  Infection, pain, poor cosmetic result, poor wound healing and retained foreign body   Alternatives discussed:  No treatment and delayed treatment Anesthesia (see MAR for exact dosages):    Anesthesia method:  Local infiltration   Local anesthetic:  Lidocaine 2% WITH epi Laceration details:    Location:  Face   Face location:  R eyebrow   Length (cm):  5 Repair type:    Repair type:  Intermediate Pre-procedure details:    Preparation:  Patient was prepped and draped in usual sterile fashion Exploration:    Contaminated: no   Treatment:    Amount of cleaning:  Standard   Irrigation solution:  Sterile saline Subcutaneous repair:  Suture size:  4-0   Suture material:  Vicryl   Suture technique:  Simple interrupted   Number of sutures:  3 Skin repair:    Repair method:  Sutures   Suture size:  5-0   Suture material:  Prolene   Suture technique:  Simple interrupted   Number of sutures:  9 Approximation:    Approximation:  Close Post-procedure details:    Dressing:  Antibiotic ointment and sterile dressing   Patient tolerance of procedure:  Tolerated well, no immediate complications   (including critical care time)  Medications Ordered in ED Medications  lidocaine-EPINEPHrine (XYLOCAINE W/EPI) 2 %-1:200000 (PF) injection 20 mL (has no administration in time range)     Initial Impression / Assessment and Plan / ED Course  I have reviewed the triage vital signs and the nursing notes.  Pertinent labs & imaging results that were available during my care of the patient were reviewed by me and considered in my medical decision making (see chart for details).  Clinical Course as of Sep 14 1730  Sun Sep 14, 2018  [id number] 58 year old male here with a mechanical fall in which he struck his head.  No LOC.  Differential includes head injury, CNS bleed, skull fracture, cervical fracture.  Due to his anticoagulation will check CT head and C-spine.  Deep laceration over right eye closed in layered fashion.   [MB]  1156 CT head and cervical spine are not showing any acute findings.   [MB]  1230 The patient's nurse talked to his wife and updated her with plan and she is able to pick him up.   [MB]    Clinical Course User Index [MB] Terrilee FilesButler, Karianna Gusman C, MD        Final Clinical Impressions(s) / ED Diagnoses   Final diagnoses:  Fall, initial encounter  Injury of head, initial encounter  Facial laceration, initial encounter    ED Discharge Orders    None       Terrilee FilesButler, Roberta Angell C, MD 09/14/18 1733

## 2018-09-14 NOTE — ED Notes (Signed)
Pt returned from imaging.

## 2018-09-14 NOTE — ED Notes (Signed)
Patient Alert and oriented to baseline. Stable and ambulatory to baseline. Patient verbalized understanding of the discharge instructions.  Patient belongings were taken by the patient. Pt discharged with wife.

## 2018-09-14 NOTE — ED Triage Notes (Signed)
Pt arrives via ems with reports of mechanical fall after getting out of the bath. Hit head, no LOC, laceration to R eye, bleeding controlled at this time. Pt in ccollar due to history of dementia. Ems reports pt on blood thinner. 150/110, HR 89, RR 18, 98% RA, cbg 121. Pt at baseline neurologically.

## 2018-09-14 NOTE — Discharge Instructions (Addendum)
You were seen in the emergency department for evaluation of injuries from a fall.  You had a large laceration over your right eyebrow that was sutured closed.  This will need suture removal in 5 to 7 days.  You also had a CAT scan of your head and cervical spine that did not show any acute findings.  Use Tylenol as needed for pain and can apply ice to the affected areas.  Follow-up with your doctor and return if any worsening symptoms.

## 2018-09-19 ENCOUNTER — Telehealth: Payer: Self-pay | Admitting: *Deleted

## 2018-09-19 ENCOUNTER — Other Ambulatory Visit: Payer: Self-pay | Admitting: Family Medicine

## 2018-09-19 NOTE — Telephone Encounter (Signed)
[employer] from [employer] for PT verbal orders as follows:  2 time(s) weekly for 2 week(s)  You can leave verbal orders on confidential voicemail.  Christen Bame, CMA

## 2018-09-20 ENCOUNTER — Other Ambulatory Visit: Payer: Self-pay | Admitting: Family Medicine

## 2018-09-20 DIAGNOSIS — K279 Peptic ulcer, site unspecified, unspecified as acute or chronic, without hemorrhage or perforation: Secondary | ICD-10-CM

## 2018-09-21 NOTE — Progress Notes (Signed)
    Subjective:  [name] [name] is a 58 y.o. male who presents to the [facility] today with a chief complaint of hospital follow-up.   HPI: Patient here for hospital follow-up.  He had had recent fall with hematoma along right rib cage.  This fall was part of his recent decline in health and has been repeated issue for him in terms of his balance.  Family said difficulty with ambulation is a growing concern.  He does have a walker but at this point is largely using wheelchair to get around which I feel is safer for him.  He has been referred in the past to a geriatric clinic and is following with them.  Objective:  Physical Exam: BP 116/90   Pulse (!) 101   SpO2 97%   Gen: NAD, he is stable but in clearly declining health  CV: RRR, 2/6 murmur Pulm: NWOB, CTAB with no crackles, wheezes, or rhonchi GI: Normal bowel sounds present. Soft, Nontender, Nondistended. Skin: Dressing is clean and dry over prior drain hematoma. Neuro: He has delayed movements consistent with his baseline Psych: He is conversational but is unable to deeply discuss his prognosis although he can express if something bothers him.  Most history is given by his wife who is very attentive and helpful to him.  No results found for this or any previous visit (from the past 72 hour(s)).   Assessment/Plan:  Impaired mobility and ADLs His decline continues.  He has been to rely more on the wheelchair which family believes will hopefully reduce recurrence of injuries.  No major events since leaving the hospital.  Hemorrhage from open wound of right chest wall Wound is clean and dry.  There is been no major recurrence of bleeding and no sign of significant new internal damage/bleeding by physical exam.   Sherene Sires, [place] - PGY2 09/21/2018 7:58 PM

## 2018-09-21 NOTE — Assessment & Plan Note (Signed)
His decline continues.  He has been to rely more on the wheelchair which family believes will hopefully reduce recurrence of injuries.  No major events since leaving the hospital.

## 2018-09-21 NOTE — Telephone Encounter (Signed)
Verbal orders called and left on voicemail as requested.  I believe this is medically appropriate Dr. Criss Rosales

## 2018-09-21 NOTE — Assessment & Plan Note (Signed)
Wound is clean and dry.  There is been no major recurrence of bleeding and no sign of significant new internal damage/bleeding by physical exam.

## 2018-09-25 ENCOUNTER — Other Ambulatory Visit: Payer: Self-pay

## 2018-09-25 ENCOUNTER — Ambulatory Visit (HOSPITAL_COMMUNITY)
Admission: RE | Admit: 2018-09-25 | Discharge: 2018-09-25 | Disposition: A | Discharge status: Home / Self Care | Payer: Medicare Other | Source: Ambulatory Visit | Attending: Family Medicine | Admitting: Family Medicine

## 2018-09-25 DIAGNOSIS — I63512 Cerebral infarction due to unspecified occlusion or stenosis of left middle cerebral artery: Secondary | ICD-10-CM

## 2018-09-25 NOTE — Progress Notes (Signed)
Carotid artery duplex completed. Refer to "CV Proc" under chart review to view preliminary results.  09/25/2018 3:47 PM Maudry Mayhew, MHA, RVT, RDCS, RDMS

## 2018-09-27 DIAGNOSIS — S21101A Unspecified open wound of right front wall of thorax without penetration into thoracic cavity, initial encounter: Secondary | ICD-10-CM | POA: Diagnosis not present

## 2018-09-27 DIAGNOSIS — M6008 Infective myositis, other site: Secondary | ICD-10-CM | POA: Diagnosis not present

## 2018-09-27 DIAGNOSIS — R69 Illness, unspecified: Secondary | ICD-10-CM | POA: Diagnosis not present

## 2018-09-27 DIAGNOSIS — S022XXD Fracture of nasal bones, subsequent encounter for fracture with routine healing: Secondary | ICD-10-CM | POA: Diagnosis not present

## 2018-09-30 DIAGNOSIS — Z79899 Other long term (current) drug therapy: Secondary | ICD-10-CM | POA: Diagnosis not present

## 2018-09-30 DIAGNOSIS — R69 Illness, unspecified: Secondary | ICD-10-CM | POA: Diagnosis not present

## 2018-09-30 DIAGNOSIS — I110 Hypertensive heart disease with heart failure: Secondary | ICD-10-CM | POA: Diagnosis not present

## 2018-09-30 DIAGNOSIS — M6008 Infective myositis, other site: Secondary | ICD-10-CM | POA: Diagnosis not present

## 2018-09-30 DIAGNOSIS — I5022 Chronic systolic (congestive) heart failure: Secondary | ICD-10-CM | POA: Diagnosis not present

## 2018-09-30 DIAGNOSIS — W182XXD Fall in (into) shower or empty bathtub, subsequent encounter: Secondary | ICD-10-CM | POA: Diagnosis not present

## 2018-09-30 DIAGNOSIS — I2510 Atherosclerotic heart disease of native coronary artery without angina pectoris: Secondary | ICD-10-CM | POA: Diagnosis not present

## 2018-09-30 DIAGNOSIS — D62 Acute posthemorrhagic anemia: Secondary | ICD-10-CM | POA: Diagnosis not present

## 2018-09-30 DIAGNOSIS — S022XXD Fracture of nasal bones, subsequent encounter for fracture with routine healing: Secondary | ICD-10-CM | POA: Diagnosis not present

## 2018-09-30 DIAGNOSIS — I69318 Other symptoms and signs involving cognitive functions following cerebral infarction: Secondary | ICD-10-CM | POA: Diagnosis not present

## 2018-10-01 ENCOUNTER — Encounter: Payer: Self-pay | Admitting: Family Medicine

## 2018-10-01 ENCOUNTER — Ambulatory Visit (INDEPENDENT_AMBULATORY_CARE_PROVIDER_SITE_OTHER): Payer: Medicare HMO | Admitting: Family Medicine

## 2018-10-01 ENCOUNTER — Other Ambulatory Visit: Payer: Self-pay | Admitting: Family Medicine

## 2018-10-01 ENCOUNTER — Other Ambulatory Visit: Payer: Self-pay

## 2018-10-01 VITALS — BP 116/82 | HR 97 | Wt 190.0 lb

## 2018-10-01 DIAGNOSIS — I519 Heart disease, unspecified: Secondary | ICD-10-CM

## 2018-10-01 DIAGNOSIS — M109 Gout, unspecified: Secondary | ICD-10-CM

## 2018-10-01 DIAGNOSIS — I5189 Other ill-defined heart diseases: Secondary | ICD-10-CM

## 2018-10-01 DIAGNOSIS — I10 Essential (primary) hypertension: Secondary | ICD-10-CM

## 2018-10-01 MED ORDER — ALLOPURINOL 300 MG PO TABS: 300.0000 mg | ORAL_TABLET | Freq: Every day | ORAL | 11 refills | Status: DC

## 2018-10-01 MED ORDER — HYDRALAZINE HCL 10 MG PO TABS: 10.0000 mg | ORAL_TABLET | Freq: Three times a day (TID) | ORAL | 0 refills | Status: DC

## 2018-10-01 NOTE — Progress Notes (Addendum)
    Subjective:  [name] [name] is a 58 y.o. male who presents to the [facility] today with a chief complaint of follow-up and medication management.   HPI: Overall mobility has been improving, he has been stable on his feet more so than in the past few weeks.  There have been less falls, he has been working physical therapy at home.  He does not have a wheelchair at the house which is has been helpful for him.  His wife is concerned that he is continuing to have memory gaps and that he is being slightly less talkative at home than he used to in the past.  She is concerned this is part of a consistent cognitive decline.  Objective:  Physical Exam: BP 116/82   Pulse 97   Wt 190 lb (86.2 kg)   SpO2 98%   BMI 25.77 kg/m   Gen: NAD, resting comfortably CV: RRR with no murmurs appreciated Pulm: NWOB, CTAB with no crackles, wheezes, or rhonchi GI: Normal bowel sounds present. Soft, Nontender, Nondistended. MSK: no edema, cyanosis, or clubbing noted Skin: Appropriate healing wounding from prior falls over right eye. Neuro: grossly normal, moves all extremities Psych: Flat affect, able to verbalize sentences although there is some slurring of speech which is persistent for him.  It appears his cognitive deficits are slowly progressing.  No results found for this or any previous visit (from the past 72 hour(s)).   Assessment/Plan:  Essential hypertension Given his other comorbidities, could be benefited by extubating to hydralazine and isosorbide mononitrate as opposed amlodipine.  We discussed pros and cons of this with his wife and we are going to stop his amlodipine and add the hydralazine.  As his blood pressures have been low and there is concern that it might be contributing to his repeated falls, we will hold on starting the isosorbide mononitrate until we are able to meet again and see how he is handling the hydralazine.  Left ventricular systolic dysfunction Given his other  comorbidities, could be benefited by extubating to hydralazine and isosorbide mononitrate as opposed amlodipine (which was used for his hypertension).  We discussed pros and cons of this with his wife and we are going to stop his amlodipine and add the hydralazine.  As his blood pressures have been low and there is concern that it might be contributing to his repeated falls, we will hold on starting the isosorbide mononitrate until we are able to meet again and see how he is handling the hydralazine.  Gout No current flares, refill of his chronic allopurinol.   Sherene Sires, DO FAMILY MEDICINE RESIDENT - PGY3 10/02/2018 11:01 AM

## 2018-10-01 NOTE — Patient Instructions (Signed)
It was a pleasure to see you today! Thank you for choosing [facility] for your primary care. [name] [name] was seen for follow-up. Come back to the clinic in a month to discuss your medication changes were made today.  Today we talked about treating amlodipine for hydralazine which she will take 3 times per day.  If you notice that this is causing any increase in falls please let us know so that we can stop it immediately.  This will only be helpful to him if it does not increase his falling.   Please bring all your medications to every doctors visit   Sign up for My Chart to have easy access to your labs results, and communication with your Primary care physician.     Please check-out at the front desk before leaving the clinic.     Best,  Dr. Sherene Sires FAMILY MEDICINE RESIDENT - PGY3 10/01/2018 3:56 PM

## 2018-10-02 DIAGNOSIS — M109 Gout, unspecified: Secondary | ICD-10-CM | POA: Insufficient documentation

## 2018-10-02 NOTE — Assessment & Plan Note (Signed)
No current flares, refill of his chronic allopurinol.

## 2018-10-02 NOTE — Assessment & Plan Note (Signed)
Given his other comorbidities, could be benefited by extubating to hydralazine and isosorbide mononitrate as opposed amlodipine.  We discussed pros and cons of this with his wife and we are going to stop his amlodipine and add the hydralazine.  As his blood pressures have been low and there is concern that it might be contributing to his repeated falls, we will hold on starting the isosorbide mononitrate until we are able to meet again and see how he is handling the hydralazine.

## 2018-10-02 NOTE — Assessment & Plan Note (Signed)
Given his other comorbidities, could be benefited by extubating to hydralazine and isosorbide mononitrate as opposed amlodipine (which was used for his hypertension).  We discussed pros and cons of this with his wife and we are going to stop his amlodipine and add the hydralazine.  As his blood pressures have been low and there is concern that it might be contributing to his repeated falls, we will hold on starting the isosorbide mononitrate until we are able to meet again and see how he is handling the hydralazine.

## 2018-10-03 ENCOUNTER — Telehealth: Payer: Self-pay | Admitting: *Deleted

## 2018-10-03 DIAGNOSIS — D62 Acute posthemorrhagic anemia: Secondary | ICD-10-CM | POA: Diagnosis not present

## 2018-10-03 DIAGNOSIS — I2510 Atherosclerotic heart disease of native coronary artery without angina pectoris: Secondary | ICD-10-CM | POA: Diagnosis not present

## 2018-10-03 DIAGNOSIS — M6008 Infective myositis, other site: Secondary | ICD-10-CM | POA: Diagnosis not present

## 2018-10-03 DIAGNOSIS — I69318 Other symptoms and signs involving cognitive functions following cerebral infarction: Secondary | ICD-10-CM | POA: Diagnosis not present

## 2018-10-03 DIAGNOSIS — R69 Illness, unspecified: Secondary | ICD-10-CM | POA: Diagnosis not present

## 2018-10-03 DIAGNOSIS — S022XXD Fracture of nasal bones, subsequent encounter for fracture with routine healing: Secondary | ICD-10-CM | POA: Diagnosis not present

## 2018-10-03 DIAGNOSIS — I5022 Chronic systolic (congestive) heart failure: Secondary | ICD-10-CM | POA: Diagnosis not present

## 2018-10-03 DIAGNOSIS — Z79899 Other long term (current) drug therapy: Secondary | ICD-10-CM | POA: Diagnosis not present

## 2018-10-03 DIAGNOSIS — W182XXD Fall in (into) shower or empty bathtub, subsequent encounter: Secondary | ICD-10-CM | POA: Diagnosis not present

## 2018-10-03 DIAGNOSIS — I110 Hypertensive heart disease with heart failure: Secondary | ICD-10-CM | POA: Diagnosis not present

## 2018-10-03 NOTE — Telephone Encounter (Signed)
Pt refused PT, so [facility] is canceling the orders. Christen Bame, CMA

## 2018-10-06 ENCOUNTER — Telehealth: Payer: Self-pay | Admitting: Family Medicine

## 2018-10-06 NOTE — Telephone Encounter (Signed)
Wife is calling because her husbands medications  were [facility] [address]. The [facility]. Can we call them in or re-send them to the pharmacy. jw

## 2018-10-07 ENCOUNTER — Other Ambulatory Visit: Payer: Self-pay | Admitting: Family Medicine

## 2018-10-07 DIAGNOSIS — I10 Essential (primary) hypertension: Secondary | ICD-10-CM

## 2018-10-07 MED ORDER — HYDRALAZINE HCL 10 MG PO TABS: 10.0000 mg | ORAL_TABLET | Freq: Three times a day (TID) | ORAL | 0 refills | Status: DC

## 2018-10-07 NOTE — Progress Notes (Signed)
Covering for Dr. Criss Rosales. [facility] [address].  Guadalupe Dawn MD PGY-3 Family Medicine Resident

## 2018-10-07 NOTE — Telephone Encounter (Signed)
Covering for dr. Criss Rosales. [facility] on [address].  Guadalupe Dawn MD PGY-3 Family Medicine Resident

## 2018-10-10 DIAGNOSIS — I110 Hypertensive heart disease with heart failure: Secondary | ICD-10-CM | POA: Diagnosis not present

## 2018-10-10 DIAGNOSIS — I2510 Atherosclerotic heart disease of native coronary artery without angina pectoris: Secondary | ICD-10-CM | POA: Diagnosis not present

## 2018-10-10 DIAGNOSIS — R69 Illness, unspecified: Secondary | ICD-10-CM | POA: Diagnosis not present

## 2018-10-10 DIAGNOSIS — W182XXD Fall in (into) shower or empty bathtub, subsequent encounter: Secondary | ICD-10-CM | POA: Diagnosis not present

## 2018-10-10 DIAGNOSIS — I5022 Chronic systolic (congestive) heart failure: Secondary | ICD-10-CM | POA: Diagnosis not present

## 2018-10-10 DIAGNOSIS — M6008 Infective myositis, other site: Secondary | ICD-10-CM | POA: Diagnosis not present

## 2018-10-10 DIAGNOSIS — I69318 Other symptoms and signs involving cognitive functions following cerebral infarction: Secondary | ICD-10-CM | POA: Diagnosis not present

## 2018-10-10 DIAGNOSIS — D62 Acute posthemorrhagic anemia: Secondary | ICD-10-CM | POA: Diagnosis not present

## 2018-10-10 DIAGNOSIS — Z79899 Other long term (current) drug therapy: Secondary | ICD-10-CM | POA: Diagnosis not present

## 2018-10-10 DIAGNOSIS — S022XXD Fracture of nasal bones, subsequent encounter for fracture with routine healing: Secondary | ICD-10-CM | POA: Diagnosis not present

## 2018-10-16 DIAGNOSIS — M6008 Infective myositis, other site: Secondary | ICD-10-CM | POA: Diagnosis not present

## 2018-10-16 DIAGNOSIS — W182XXD Fall in (into) shower or empty bathtub, subsequent encounter: Secondary | ICD-10-CM | POA: Diagnosis not present

## 2018-10-16 DIAGNOSIS — R69 Illness, unspecified: Secondary | ICD-10-CM | POA: Diagnosis not present

## 2018-10-16 DIAGNOSIS — Z79899 Other long term (current) drug therapy: Secondary | ICD-10-CM | POA: Diagnosis not present

## 2018-10-16 DIAGNOSIS — D62 Acute posthemorrhagic anemia: Secondary | ICD-10-CM | POA: Diagnosis not present

## 2018-10-16 DIAGNOSIS — I69318 Other symptoms and signs involving cognitive functions following cerebral infarction: Secondary | ICD-10-CM | POA: Diagnosis not present

## 2018-10-16 DIAGNOSIS — I5022 Chronic systolic (congestive) heart failure: Secondary | ICD-10-CM | POA: Diagnosis not present

## 2018-10-16 DIAGNOSIS — I2510 Atherosclerotic heart disease of native coronary artery without angina pectoris: Secondary | ICD-10-CM | POA: Diagnosis not present

## 2018-10-16 DIAGNOSIS — S022XXD Fracture of nasal bones, subsequent encounter for fracture with routine healing: Secondary | ICD-10-CM | POA: Diagnosis not present

## 2018-10-16 DIAGNOSIS — I110 Hypertensive heart disease with heart failure: Secondary | ICD-10-CM | POA: Diagnosis not present

## 2018-10-23 ENCOUNTER — Telehealth: Payer: Self-pay

## 2018-10-23 DIAGNOSIS — I69318 Other symptoms and signs involving cognitive functions following cerebral infarction: Secondary | ICD-10-CM | POA: Diagnosis not present

## 2018-10-23 DIAGNOSIS — R269 Unspecified abnormalities of gait and mobility: Secondary | ICD-10-CM

## 2018-10-23 DIAGNOSIS — R296 Repeated falls: Secondary | ICD-10-CM

## 2018-10-23 DIAGNOSIS — I5022 Chronic systolic (congestive) heart failure: Secondary | ICD-10-CM | POA: Diagnosis not present

## 2018-10-23 DIAGNOSIS — S022XXD Fracture of nasal bones, subsequent encounter for fracture with routine healing: Secondary | ICD-10-CM | POA: Diagnosis not present

## 2018-10-23 DIAGNOSIS — Z79899 Other long term (current) drug therapy: Secondary | ICD-10-CM | POA: Diagnosis not present

## 2018-10-23 DIAGNOSIS — I110 Hypertensive heart disease with heart failure: Secondary | ICD-10-CM | POA: Diagnosis not present

## 2018-10-23 DIAGNOSIS — D62 Acute posthemorrhagic anemia: Secondary | ICD-10-CM | POA: Diagnosis not present

## 2018-10-23 DIAGNOSIS — R69 Illness, unspecified: Secondary | ICD-10-CM | POA: Diagnosis not present

## 2018-10-23 DIAGNOSIS — I2510 Atherosclerotic heart disease of native coronary artery without angina pectoris: Secondary | ICD-10-CM | POA: Diagnosis not present

## 2018-10-23 DIAGNOSIS — W182XXD Fall in (into) shower or empty bathtub, subsequent encounter: Secondary | ICD-10-CM | POA: Diagnosis not present

## 2018-10-23 DIAGNOSIS — M6008 Infective myositis, other site: Secondary | ICD-10-CM | POA: Diagnosis not present

## 2018-10-23 NOTE — Telephone Encounter (Signed)
Please call [facility] and deliver my verbal orders: 1x1wk, 2x/wk for 4 wks. : [phone] to give verbal orders  DME orders for  Bedside cammode placed, please Fax rx to [phone].  [name] should schedule and appt to have him seen in person (any doctor is fine) if she is concerned that he is injured.  -Dr. Criss Rosales

## 2018-10-23 NOTE — Telephone Encounter (Signed)
[facility] calling for verbal orders,   1x 1 week 2 x a week for 4 weeks Needs Rx for  Bedside cammode Fax rx to [phone] and call [phone] to give verbal orders. Juluis Rainier.Marland KitchenMarland KitchenPt had a fall yesterday. Pt  Denies pain anywhere. Pt fell and his back hit the tub. Has several large bruises.   Ottis Stain, CMA

## 2018-10-24 NOTE — Telephone Encounter (Signed)
LVM to call office back to give the verbal order for this pt.[name] [name], CMA

## 2018-10-24 NOTE — Telephone Encounter (Signed)
LVM for [place] to call to give the verbal orders and also tried to reach pt wife and phone only rang and was unable to LVM.  If she calls back ask her if pt needs to be seen before his appointment with Dr. Criss Rosales on 11/05/2018.  If she feels like he needs to be seen please assist in getting an appointment scheduled. [name] [name], CMA

## 2018-10-24 NOTE — Telephone Encounter (Signed)
Home health orders given via VM.

## 2018-10-28 DIAGNOSIS — S21101A Unspecified open wound of right front wall of thorax without penetration into thoracic cavity, initial encounter: Secondary | ICD-10-CM | POA: Diagnosis not present

## 2018-10-28 DIAGNOSIS — D62 Acute posthemorrhagic anemia: Secondary | ICD-10-CM | POA: Diagnosis not present

## 2018-10-28 DIAGNOSIS — S022XXD Fracture of nasal bones, subsequent encounter for fracture with routine healing: Secondary | ICD-10-CM | POA: Diagnosis not present

## 2018-10-28 DIAGNOSIS — M6008 Infective myositis, other site: Secondary | ICD-10-CM | POA: Diagnosis not present

## 2018-10-28 DIAGNOSIS — R69 Illness, unspecified: Secondary | ICD-10-CM | POA: Diagnosis not present

## 2018-10-28 DIAGNOSIS — I69318 Other symptoms and signs involving cognitive functions following cerebral infarction: Secondary | ICD-10-CM | POA: Diagnosis not present

## 2018-10-28 DIAGNOSIS — I2510 Atherosclerotic heart disease of native coronary artery without angina pectoris: Secondary | ICD-10-CM | POA: Diagnosis not present

## 2018-10-28 DIAGNOSIS — I110 Hypertensive heart disease with heart failure: Secondary | ICD-10-CM | POA: Diagnosis not present

## 2018-10-28 DIAGNOSIS — I5022 Chronic systolic (congestive) heart failure: Secondary | ICD-10-CM | POA: Diagnosis not present

## 2018-10-28 DIAGNOSIS — W182XXD Fall in (into) shower or empty bathtub, subsequent encounter: Secondary | ICD-10-CM | POA: Diagnosis not present

## 2018-10-28 DIAGNOSIS — Z79899 Other long term (current) drug therapy: Secondary | ICD-10-CM | POA: Diagnosis not present

## 2018-10-30 DIAGNOSIS — I5022 Chronic systolic (congestive) heart failure: Secondary | ICD-10-CM | POA: Diagnosis not present

## 2018-10-30 DIAGNOSIS — I69318 Other symptoms and signs involving cognitive functions following cerebral infarction: Secondary | ICD-10-CM | POA: Diagnosis not present

## 2018-10-30 DIAGNOSIS — D62 Acute posthemorrhagic anemia: Secondary | ICD-10-CM | POA: Diagnosis not present

## 2018-10-30 DIAGNOSIS — I110 Hypertensive heart disease with heart failure: Secondary | ICD-10-CM | POA: Diagnosis not present

## 2018-10-30 DIAGNOSIS — R69 Illness, unspecified: Secondary | ICD-10-CM | POA: Diagnosis not present

## 2018-10-30 DIAGNOSIS — Z79899 Other long term (current) drug therapy: Secondary | ICD-10-CM | POA: Diagnosis not present

## 2018-10-30 DIAGNOSIS — S022XXD Fracture of nasal bones, subsequent encounter for fracture with routine healing: Secondary | ICD-10-CM | POA: Diagnosis not present

## 2018-10-30 DIAGNOSIS — I2510 Atherosclerotic heart disease of native coronary artery without angina pectoris: Secondary | ICD-10-CM | POA: Diagnosis not present

## 2018-10-30 DIAGNOSIS — M6008 Infective myositis, other site: Secondary | ICD-10-CM | POA: Diagnosis not present

## 2018-10-30 DIAGNOSIS — W182XXD Fall in (into) shower or empty bathtub, subsequent encounter: Secondary | ICD-10-CM | POA: Diagnosis not present

## 2018-11-04 DIAGNOSIS — I2510 Atherosclerotic heart disease of native coronary artery without angina pectoris: Secondary | ICD-10-CM | POA: Diagnosis not present

## 2018-11-04 DIAGNOSIS — M6008 Infective myositis, other site: Secondary | ICD-10-CM | POA: Diagnosis not present

## 2018-11-04 DIAGNOSIS — M6281 Muscle weakness (generalized): Secondary | ICD-10-CM | POA: Diagnosis not present

## 2018-11-04 DIAGNOSIS — Z79899 Other long term (current) drug therapy: Secondary | ICD-10-CM | POA: Diagnosis not present

## 2018-11-04 DIAGNOSIS — R69 Illness, unspecified: Secondary | ICD-10-CM | POA: Diagnosis not present

## 2018-11-04 DIAGNOSIS — S022XXD Fracture of nasal bones, subsequent encounter for fracture with routine healing: Secondary | ICD-10-CM | POA: Diagnosis not present

## 2018-11-04 DIAGNOSIS — I5022 Chronic systolic (congestive) heart failure: Secondary | ICD-10-CM | POA: Diagnosis not present

## 2018-11-04 DIAGNOSIS — W182XXD Fall in (into) shower or empty bathtub, subsequent encounter: Secondary | ICD-10-CM | POA: Diagnosis not present

## 2018-11-04 DIAGNOSIS — I69318 Other symptoms and signs involving cognitive functions following cerebral infarction: Secondary | ICD-10-CM | POA: Diagnosis not present

## 2018-11-04 DIAGNOSIS — D62 Acute posthemorrhagic anemia: Secondary | ICD-10-CM | POA: Diagnosis not present

## 2018-11-04 DIAGNOSIS — I110 Hypertensive heart disease with heart failure: Secondary | ICD-10-CM | POA: Diagnosis not present

## 2018-11-05 ENCOUNTER — Other Ambulatory Visit: Payer: Self-pay

## 2018-11-05 ENCOUNTER — Ambulatory Visit (INDEPENDENT_AMBULATORY_CARE_PROVIDER_SITE_OTHER): Payer: Medicare HMO | Admitting: Family Medicine

## 2018-11-05 ENCOUNTER — Encounter: Payer: Self-pay | Admitting: Family Medicine

## 2018-11-05 VITALS — BP 142/110 | HR 91 | Wt 188.2 lb

## 2018-11-05 DIAGNOSIS — I10 Essential (primary) hypertension: Secondary | ICD-10-CM | POA: Diagnosis not present

## 2018-11-05 DIAGNOSIS — K279 Peptic ulcer, site unspecified, unspecified as acute or chronic, without hemorrhage or perforation: Secondary | ICD-10-CM

## 2018-11-05 DIAGNOSIS — Z23 Encounter for immunization: Secondary | ICD-10-CM | POA: Diagnosis not present

## 2018-11-05 DIAGNOSIS — R296 Repeated falls: Secondary | ICD-10-CM | POA: Diagnosis not present

## 2018-11-05 DIAGNOSIS — Z7189 Other specified counseling: Secondary | ICD-10-CM

## 2018-11-05 MED ORDER — PANTOPRAZOLE SODIUM 40 MG PO TBEC: 40.0000 mg | DELAYED_RELEASE_TABLET | Freq: Every day | ORAL | 3 refills | Status: DC

## 2018-11-05 MED ORDER — ISOSORBIDE DINITRATE 5 MG PO TABS: 5.0000 mg | ORAL_TABLET | Freq: Three times a day (TID) | ORAL | 0 refills | Status: DC

## 2018-11-05 NOTE — Patient Instructions (Signed)
It was a pleasure to see you today! Thank you for choosing [facility] for your primary care. [name] [name] was seen for check-in and increasing falls with cognitive decline.. Come back to the clinic in a few weeks to see me.   Today we talked about stopping her hydralazine, we have prescribed some isosorbide dinitrate which you can start taking.  If you notice any increase in falls when you start this you should stop it immediately and let me know.  We also had a long discussion about CODE STATUS and how [name] [name] would not want to have CPR or intubation, I will document this in the chart.  If at home you decide this changes please let me know immediately.  I will have the palliative care team reach out to you to try and discuss what resources are available.   Please bring all your medications to every doctors visit   Sign up for My Chart to have easy access to your labs results, and communication with your Primary care physician.     Please check-out at the front desk before leaving the clinic.     Best,  [name] [name] FAMILY MEDICINE RESIDENT - PGY3 11/05/2018 4:34 PM

## 2018-11-06 DIAGNOSIS — I69318 Other symptoms and signs involving cognitive functions following cerebral infarction: Secondary | ICD-10-CM | POA: Diagnosis not present

## 2018-11-06 DIAGNOSIS — D62 Acute posthemorrhagic anemia: Secondary | ICD-10-CM | POA: Diagnosis not present

## 2018-11-06 DIAGNOSIS — W182XXD Fall in (into) shower or empty bathtub, subsequent encounter: Secondary | ICD-10-CM | POA: Diagnosis not present

## 2018-11-06 DIAGNOSIS — I5022 Chronic systolic (congestive) heart failure: Secondary | ICD-10-CM | POA: Diagnosis not present

## 2018-11-06 DIAGNOSIS — R69 Illness, unspecified: Secondary | ICD-10-CM | POA: Diagnosis not present

## 2018-11-06 DIAGNOSIS — I2510 Atherosclerotic heart disease of native coronary artery without angina pectoris: Secondary | ICD-10-CM | POA: Diagnosis not present

## 2018-11-06 DIAGNOSIS — Z79899 Other long term (current) drug therapy: Secondary | ICD-10-CM | POA: Diagnosis not present

## 2018-11-06 DIAGNOSIS — I110 Hypertensive heart disease with heart failure: Secondary | ICD-10-CM | POA: Diagnosis not present

## 2018-11-06 DIAGNOSIS — S022XXD Fracture of nasal bones, subsequent encounter for fracture with routine healing: Secondary | ICD-10-CM | POA: Diagnosis not present

## 2018-11-06 DIAGNOSIS — M6008 Infective myositis, other site: Secondary | ICD-10-CM | POA: Diagnosis not present

## 2018-11-07 DIAGNOSIS — Z7189 Other specified counseling: Secondary | ICD-10-CM | POA: Insufficient documentation

## 2018-11-07 DIAGNOSIS — Z23 Encounter for immunization: Secondary | ICD-10-CM | POA: Insufficient documentation

## 2018-11-07 NOTE — Assessment & Plan Note (Signed)
Patient with elevated blood pressure, had been prescribed hydralazine but his wife took him off it because he started having a traumatic increase in falls and this medicine.  142/110 today, will not restart the hydralazine due to the increased risk of falls, but will instead try isosorbide dinitrate, we will start with low-dose and if he is begins to have dramatically increasing falls we will stop this as well.  We discussed with him and his wife that we are running out of options as his health generally declines.

## 2018-11-07 NOTE — Assessment & Plan Note (Signed)
No complaint of belly pain or rectal bleeding at this time.  We will refill chronic pantoprazole.

## 2018-11-07 NOTE — Assessment & Plan Note (Signed)
We discussed the patient's health condition is consistently declining.  Patient still with multiple falls per week at home, despite physical therapy and having a walker.  He will routinely leave the walker when no one is around, he says that he remembers leaving the walker but I am not sure that I trust that statement as he is unable to describe the thought process behind doing so.  We did discuss his continued statements that he does not want to be in a nursing home and that when his time comes to die he would prefer to do that in his own house.  We discussed the notion of CODE STATUS and he said specifically that he did not want to be intubated or have CPR.  This was discussed frankly in front of his wife who said that she believed that he was in his right mind to make this decision although she wished she would make a different one.  They both consent to palliative care consult.

## 2018-11-07 NOTE — Progress Notes (Signed)
Subjective:  [name] [name] is a 58 y.o. male who [facility] with a chief complaint of frequent falls.   HPI: Goals of care, counseling/discussion We discussed the patient's health condition is consistently declining.  Patient still with multiple falls per week at home, despite physical therapy and having a walker.  He will routinely leave the walker when no one is around, he says that he remembers leaving the walker but I am not sure that I trust that statement as he is unable to describe the thought process behind doing so.  We did discuss his continued statements that he does not want to be in a nursing home and that when his time comes to die he would prefer to do that in his own house.  We discussed the notion of CODE STATUS and he said specifically that he did not want to be intubated or have CPR.  This was discussed frankly in front of his wife who said that she believed that he was in his right mind to make this decision although she wished she would make a different one.  Frequent falls Patient with generally increasing falls as is status declines.  No significant new injuries since last time we spoke.  After starting the new medicine of hydralazine, his wife said he had a dramatic increase in falls so she had stopped the hydralazine.  Blood pressure is 142/110 today, we discussed the need to control his blood pressure as part of his general health but that falls could be a higher risk to him so we will not restart hydralazine.    Peptic ulcer No complaint of belly pain or rectal bleeding at this time.  [name] would like refill of chronic pantoprazole  Essential hypertension Patient with elevated blood pressure, had been prescribed hydralazine but his wife took him off it because he started having a traumatic increase in falls and this medicine.  142/110 today,  Objective:  Physical Exam: BP (!) 142/110   Pulse 91   Wt 188 lb 3.2 oz (85.4 kg)   SpO2 98%    BMI 25.52 kg/m   Gen: Ill but not toxic appearing.  This patient is well-known to me and appears to be in steady decline, in wheelchair CV: RRR with no murmurs appreciated Pulm: NWOB, CTAB with no crackles, wheezes, or rhonchi MSK: no edema, cyanosis, or clubbing noted Skin: warm, dry Neuro: grossly normal, moves all extremities Psych: He does seem able to process simple questions and can express basic desires, but has some difficulty doing higher-order discussion and shows questionable memory  No results found for this or any previous visit (from the past 72 hour(s)).   Assessment/Plan:  Need for immunization against influenza Patient consents to flu shot  Goals of care, counseling/discussion We discussed the patient's health condition is consistently declining.  Patient still with multiple falls per week at home, despite physical therapy and having a walker.  He will routinely leave the walker when no one is around, he says that he remembers leaving the walker but I am not sure that I trust that statement as he is unable to describe the thought process behind doing so.  We did discuss his continued statements that he does not want to be in a nursing home and that when his time comes to die he would prefer to do that in his own house.  We discussed the notion of CODE STATUS and he said specifically that he did  not want to be intubated or have CPR.  This was discussed frankly in front of his wife who said that she believed that he was in his right mind to make this decision although she wished she would make a different one.  They both consent to palliative care consult.  Frequent falls Patient with generally increasing falls as is status declines.  No significant new injuries since last time we spoke.  After starting the new medicine of hydralazine, his wife said he had a dramatic increase in falls so she had stopped the hydralazine.  Blood pressure is 142/110 today, we discussed the need to  control his blood pressure as part of his general health but that falls could be a higher risk to him so we will not restart hydralazine.  The other recommendation had been to consider isosorbide dinitrate which she is willing to try so we will start a low-dose, he is been instructed if he starts having dramatically more falls on this that he should stop it as well.  Peptic ulcer No complaint of belly pain or rectal bleeding at this time.  We will refill chronic pantoprazole.  Essential hypertension Patient with elevated blood pressure, had been prescribed hydralazine but his wife took him off it because he started having a traumatic increase in falls and this medicine.  142/110 today, will not restart the hydralazine due to the increased risk of falls, but will instead try isosorbide dinitrate, we will start with low-dose and if he is begins to have dramatically increasing falls we will stop this as well.  We discussed with him and his wife that we are running out of options as his health generally declines.   Sherene Sires, DO FAMILY MEDICINE RESIDENT - PGY3 11/07/2018 10:55 AM

## 2018-11-07 NOTE — Assessment & Plan Note (Signed)
Patient consents to flu shot 

## 2018-11-07 NOTE — Assessment & Plan Note (Signed)
Patient with generally increasing falls as is status declines.  No significant new injuries since last time we spoke.  After starting the new medicine of hydralazine, his wife said he had a dramatic increase in falls so she had stopped the hydralazine.  Blood pressure is 142/110 today, we discussed the need to control his blood pressure as part of his general health but that falls could be a higher risk to him so we will not restart hydralazine.  The other recommendation had been to consider isosorbide dinitrate which she is willing to try so we will start a low-dose, he is been instructed if he starts having dramatically more falls on this that he should stop it as well.

## 2018-11-08 ENCOUNTER — Other Ambulatory Visit: Payer: Self-pay | Admitting: Family Medicine

## 2018-11-11 ENCOUNTER — Telehealth: Payer: Self-pay | Admitting: Internal Medicine

## 2018-11-11 DIAGNOSIS — I2510 Atherosclerotic heart disease of native coronary artery without angina pectoris: Secondary | ICD-10-CM | POA: Diagnosis not present

## 2018-11-11 DIAGNOSIS — I69318 Other symptoms and signs involving cognitive functions following cerebral infarction: Secondary | ICD-10-CM | POA: Diagnosis not present

## 2018-11-11 DIAGNOSIS — I5022 Chronic systolic (congestive) heart failure: Secondary | ICD-10-CM | POA: Diagnosis not present

## 2018-11-11 DIAGNOSIS — R69 Illness, unspecified: Secondary | ICD-10-CM | POA: Diagnosis not present

## 2018-11-11 DIAGNOSIS — D62 Acute posthemorrhagic anemia: Secondary | ICD-10-CM | POA: Diagnosis not present

## 2018-11-11 DIAGNOSIS — W182XXD Fall in (into) shower or empty bathtub, subsequent encounter: Secondary | ICD-10-CM | POA: Diagnosis not present

## 2018-11-11 DIAGNOSIS — S022XXD Fracture of nasal bones, subsequent encounter for fracture with routine healing: Secondary | ICD-10-CM | POA: Diagnosis not present

## 2018-11-11 DIAGNOSIS — I110 Hypertensive heart disease with heart failure: Secondary | ICD-10-CM | POA: Diagnosis not present

## 2018-11-11 DIAGNOSIS — Z79899 Other long term (current) drug therapy: Secondary | ICD-10-CM | POA: Diagnosis not present

## 2018-11-11 DIAGNOSIS — M6008 Infective myositis, other site: Secondary | ICD-10-CM | POA: Diagnosis not present

## 2018-11-11 NOTE — Telephone Encounter (Signed)
Spoke with wife Ivin Booty regarding Palliative services and she requested that I call her back end of next week because they are in the process of moving.  Told wife that I would follow-up with her at that time.

## 2018-11-12 ENCOUNTER — Telehealth: Payer: Self-pay | Admitting: *Deleted

## 2018-11-12 NOTE — Telephone Encounter (Signed)
OT calls to report that they are discharging pt from OT as he is refusing to participate. Christen Bame, CMA

## 2018-11-27 ENCOUNTER — Other Ambulatory Visit: Payer: Self-pay | Admitting: Family Medicine

## 2018-11-27 DIAGNOSIS — M6008 Infective myositis, other site: Secondary | ICD-10-CM | POA: Diagnosis not present

## 2018-11-27 DIAGNOSIS — I10 Essential (primary) hypertension: Secondary | ICD-10-CM

## 2018-11-27 DIAGNOSIS — R69 Illness, unspecified: Secondary | ICD-10-CM | POA: Diagnosis not present

## 2018-11-27 DIAGNOSIS — S21101A Unspecified open wound of right front wall of thorax without penetration into thoracic cavity, initial encounter: Secondary | ICD-10-CM | POA: Diagnosis not present

## 2018-11-27 DIAGNOSIS — S022XXD Fracture of nasal bones, subsequent encounter for fracture with routine healing: Secondary | ICD-10-CM | POA: Diagnosis not present

## 2018-12-02 ENCOUNTER — Other Ambulatory Visit: Payer: Medicare HMO | Admitting: Internal Medicine

## 2018-12-02 ENCOUNTER — Other Ambulatory Visit: Payer: Self-pay

## 2018-12-08 ENCOUNTER — Ambulatory Visit (INDEPENDENT_AMBULATORY_CARE_PROVIDER_SITE_OTHER): Payer: Medicare HMO | Admitting: Family Medicine

## 2018-12-08 ENCOUNTER — Encounter: Payer: Self-pay | Admitting: Family Medicine

## 2018-12-08 ENCOUNTER — Other Ambulatory Visit: Payer: Self-pay

## 2018-12-08 VITALS — BP 128/86 | HR 71

## 2018-12-08 DIAGNOSIS — R296 Repeated falls: Secondary | ICD-10-CM

## 2018-12-08 DIAGNOSIS — R69 Illness, unspecified: Secondary | ICD-10-CM | POA: Diagnosis not present

## 2018-12-08 DIAGNOSIS — F0150 Vascular dementia without behavioral disturbance: Secondary | ICD-10-CM

## 2018-12-08 DIAGNOSIS — Z7189 Other specified counseling: Secondary | ICD-10-CM | POA: Diagnosis not present

## 2018-12-09 ENCOUNTER — Encounter: Payer: Self-pay | Admitting: Family Medicine

## 2018-12-09 NOTE — Assessment & Plan Note (Signed)
We had been titrating blood pressure medication with the hope of avoiding falls.  There have been no more falls since our last visit but unfortunately this is because he has stopped trying to get up.  He largely lays in bed and less she tells me needs to get up to go somewhere.  We did attempt to have him stand in the office and he lacks the strength and coordination to do so on his own which is a decrease in function from prior visits.

## 2018-12-09 NOTE — Assessment & Plan Note (Addendum)
Unfortunately his disease is progressing and he now has stopped try to get up out of his chair, he is now has incontinence which he says that he realizes but feels embarrassed about and does not want to tell his wife.  His speech is reducing to 2-3 words at a time and set a full sentences.  He seems to have intermittent awareness of the conversations.  Discussed goals of care with family again which to date still include not going to a nursing home.  They have a conversation with palliative next week.

## 2018-12-09 NOTE — Progress Notes (Signed)
Subjective:  [name] [name] is a 58 y.o. male who presents to the [facility] today with a chief complaint of medication management to avoid frequent falls.   HPI: Frequent falls We had been titrating blood pressure medication with the hope of avoiding falls.  There have been no more falls since our last visit but unfortunately this is because he has stopped trying to get up.  He largely lays in bed and less she tells me needs to get up to go somewhere.  We did attempt to have him stand in the office and he lacks the strength and coordination to do so on his own which is a decrease in function from prior visits.  Goals of care, counseling/discussion Both patient and his wife are looking forward to healthcare discussion scheduled next week.  It is at this time still their current desire to avoid SNF placement as she believes her husband would want to be at home when he dies.  He is now lacking the strength to consistently stand up on his own and is having bowel incontinence.  Being able to properly care for him is beginning to stretch the limits of her capacity.  The hope is that palliative will be able to offer some more resources.  Dementia [facility]), vascular Unfortunately his disease is progressing and he now has stopped try to get up out of his chair, he is now has incontinence which he says that he realizes but feels embarrassed about and does not want to tell his wife.  His speech is reducing to 2-3 words at a time and set a full sentences.  He seems to have intermittent awareness of the conversations.   Objective:  Physical Exam: BP 128/86   Pulse 71   SpO2 96%   Gen: Less interactive, no respiratory distress  CV: RRR Pulm: NWOB, CTAB with no crackles, wheezes, or rhonchi GI: Normal bowel sounds present. Soft, Nontender, Nondistended. MSK: no edema, cyanosis, or clubbing noted. *Patient was unable to stand out of the wheelchair on his own which is a decline from prior visits and he could  stand up but balance was a concern Skin: warm, dry Neuro/psych: Delayed response to all questions, answers only in 2-3 word combinations, occasionally will not answer at all which does not appear willful.  Does seem to have intermittent unpredictable capacity to give informed opinion when asked questions.  No specific new focal neurological deficits but general cognitive decline  No results found for this or any previous visit (from the past 72 hour(s)).   Assessment/Plan:  Goals of care, counseling/discussion Both patient and his wife are looking forward to healthcare discussion scheduled next week.  It is at this time still their current desire to avoid SNF placement as she believes her husband would want to be at home when he dies.  He is now lacking the strength to consistently stand up on his own and is having bowel incontinence.  Being able to properly care for him is beginning to stretch the limits of her capacity.  The hope is that palliative will be able to offer some more resources.  Frequent falls We had been titrating blood pressure medication with the hope of avoiding falls.  There have been no more falls since our last visit but unfortunately this is because he has stopped trying to get up.  He largely lays in bed and less she tells me needs to get up to go somewhere.  We did attempt to have  him stand in the office and he lacks the strength and coordination to do so on his own which is a decrease in function from prior visits.  Dementia [facility]), vascular Unfortunately his disease is progressing and he now has stopped try to get up out of his chair, he is now has incontinence which he says that he realizes but feels embarrassed about and does not want to tell his wife.  His speech is reducing to 2-3 words at a time and set a full sentences.  He seems to have intermittent awareness of the conversations.  Discussed goals of care with family again which to date still include not going to a  nursing home.  They have a conversation with palliative next week.   Sherene Sires, DO FAMILY MEDICINE RESIDENT - PGY3 12/09/2018 10:28 AM

## 2018-12-09 NOTE — Assessment & Plan Note (Signed)
Both patient and his wife are looking forward to healthcare discussion scheduled next week.  It is at this time still their current desire to avoid SNF placement as she believes her husband would want to be at home when he dies.  He is now lacking the strength to consistently stand up on his own and is having bowel incontinence.  Being able to properly care for him is beginning to stretch the limits of her capacity.  The hope is that palliative will be able to offer some more resources.

## 2018-12-15 ENCOUNTER — Other Ambulatory Visit: Payer: Medicare HMO | Admitting: Internal Medicine

## 2018-12-15 ENCOUNTER — Other Ambulatory Visit: Payer: Self-pay | Admitting: Family Medicine

## 2018-12-16 ENCOUNTER — Other Ambulatory Visit: Payer: Self-pay

## 2018-12-20 ENCOUNTER — Other Ambulatory Visit: Payer: Self-pay | Admitting: Family Medicine

## 2018-12-20 DIAGNOSIS — I10 Essential (primary) hypertension: Secondary | ICD-10-CM

## 2018-12-22 NOTE — Telephone Encounter (Signed)
Please call patient's wife.  I have refilled their meds, but I have not seen a note from the palliative care referal that  Was placed mid September for them.  Please verify that palliative made contact and assisted them with goals of care (there was a question about getting snf placement).  If palliative did not make contact and we need a new referal placed, please let me know to do so again or place an order on my behalf and I can cosign  -Dr. Criss Rosales

## 2018-12-23 NOTE — Telephone Encounter (Signed)
Tried to contact pt wife and inform her of below and phone only rang and VM has not been set up.  If she returns call please inform her of below.Latashia Koch Zimmerman Rumple, CMA

## 2018-12-28 DIAGNOSIS — M6008 Infective myositis, other site: Secondary | ICD-10-CM | POA: Diagnosis not present

## 2018-12-28 DIAGNOSIS — R69 Illness, unspecified: Secondary | ICD-10-CM | POA: Diagnosis not present

## 2018-12-28 DIAGNOSIS — S022XXD Fracture of nasal bones, subsequent encounter for fracture with routine healing: Secondary | ICD-10-CM | POA: Diagnosis not present

## 2018-12-28 DIAGNOSIS — S21101A Unspecified open wound of right front wall of thorax without penetration into thoracic cavity, initial encounter: Secondary | ICD-10-CM | POA: Diagnosis not present

## 2019-01-09 NOTE — Telephone Encounter (Signed)
Contacted pt wife and she did say the palliative care had made contact with them and has been there some already. April Zimmerman Rumple, CMA

## 2019-01-27 DIAGNOSIS — S022XXD Fracture of nasal bones, subsequent encounter for fracture with routine healing: Secondary | ICD-10-CM | POA: Diagnosis not present

## 2019-01-27 DIAGNOSIS — M6008 Infective myositis, other site: Secondary | ICD-10-CM | POA: Diagnosis not present

## 2019-01-27 DIAGNOSIS — S21101A Unspecified open wound of right front wall of thorax without penetration into thoracic cavity, initial encounter: Secondary | ICD-10-CM | POA: Diagnosis not present

## 2019-01-27 DIAGNOSIS — R69 Illness, unspecified: Secondary | ICD-10-CM | POA: Diagnosis not present

## 2019-02-07 ENCOUNTER — Other Ambulatory Visit: Payer: Self-pay | Admitting: Family Medicine

## 2019-02-07 DIAGNOSIS — I10 Essential (primary) hypertension: Secondary | ICD-10-CM

## 2019-02-09 ENCOUNTER — Telehealth: Payer: Self-pay | Admitting: *Deleted

## 2019-02-09 NOTE — Telephone Encounter (Signed)
Called pt and spoke to his wife to inform them that the cologuard that they were sent expires on 02/24/2019.  Recommended that they complete this and get it in the mail ASAP. WHile on the phone that pts wife stated that no one has been out since November 9th and she wanted to let PCP know. I asked her to contact the person that did come out to confirm that they were with the home health care business and if so why they havent been back, and told her that if indeed they were not with the home health agency then we may need to resend the request for this, Routing to PCP as an FYI. Rodolph Hagemann Zimmerman Rumple, CMA

## 2019-02-10 ENCOUNTER — Other Ambulatory Visit: Payer: Self-pay | Admitting: Family Medicine

## 2019-02-11 ENCOUNTER — Telehealth: Payer: Self-pay | Admitting: Family Medicine

## 2019-02-11 NOTE — Telephone Encounter (Signed)
Patients wife called to inform PCP that Palliative Care nurse will be coming to pt's home 02-16-19. Please give pt's wife a call.

## 2019-02-27 DIAGNOSIS — R69 Illness, unspecified: Secondary | ICD-10-CM | POA: Diagnosis not present

## 2019-02-27 DIAGNOSIS — M6008 Infective myositis, other site: Secondary | ICD-10-CM | POA: Diagnosis not present

## 2019-02-27 DIAGNOSIS — S022XXD Fracture of nasal bones, subsequent encounter for fracture with routine healing: Secondary | ICD-10-CM | POA: Diagnosis not present

## 2019-03-11 ENCOUNTER — Telehealth: Payer: Self-pay | Admitting: Family Medicine

## 2019-03-11 NOTE — Telephone Encounter (Signed)
[employer] received an order from Dr Deirdre Priest for a cologuard for this patient. [employer] called to let us know they have tried reaching out to the patient multiple times and have not been successful in reaching him and he does not have a VM option. They wanted to let us know they will not be reaching out anymore and that if we get in touch with him to have him call [employer] at [phone] and follow the prompt for patient support and anybody should be able to help him.

## 2019-03-30 DIAGNOSIS — R69 Illness, unspecified: Secondary | ICD-10-CM | POA: Diagnosis not present

## 2019-03-30 DIAGNOSIS — S022XXD Fracture of nasal bones, subsequent encounter for fracture with routine healing: Secondary | ICD-10-CM | POA: Diagnosis not present

## 2019-03-30 DIAGNOSIS — M6008 Infective myositis, other site: Secondary | ICD-10-CM | POA: Diagnosis not present

## 2019-04-12 ENCOUNTER — Other Ambulatory Visit: Payer: Self-pay | Admitting: Family Medicine

## 2019-04-12 DIAGNOSIS — I5022 Chronic systolic (congestive) heart failure: Secondary | ICD-10-CM

## 2019-04-20 ENCOUNTER — Other Ambulatory Visit: Payer: Medicare HMO | Admitting: Internal Medicine

## 2019-04-21 ENCOUNTER — Other Ambulatory Visit: Payer: Self-pay

## 2019-04-23 ENCOUNTER — Telehealth: Payer: Self-pay

## 2019-04-23 DIAGNOSIS — F0151 Vascular dementia with behavioral disturbance: Secondary | ICD-10-CM

## 2019-04-23 DIAGNOSIS — F01518 Vascular dementia, unspecified severity, with other behavioral disturbance: Secondary | ICD-10-CM

## 2019-04-23 NOTE — Telephone Encounter (Signed)
Referal to SW for care coordination placed  -Dr. Parke Simmers
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
## 2019-04-27 DIAGNOSIS — M6008 Infective myositis, other site: Secondary | ICD-10-CM | POA: Diagnosis not present

## 2019-04-27 DIAGNOSIS — S022XXD Fracture of nasal bones, subsequent encounter for fracture with routine healing: Secondary | ICD-10-CM | POA: Diagnosis not present

## 2019-04-27 DIAGNOSIS — R69 Illness, unspecified: Secondary | ICD-10-CM | POA: Diagnosis not present

## 2019-04-29 ENCOUNTER — Ambulatory Visit: Payer: Self-pay | Admitting: Licensed Clinical Social Worker

## 2019-04-29 NOTE — Chronic Care Management (AMB) (Signed)
  Social Work Care Management  Unsuccessful Phone Outreach   04/29/2019 Name: [name] [name] MRN: [id number] DOB: [birth year]  Referred by: Marthenia Rolling, DO,  Reason for referral : Care Coordination (caregiver resources)   [name] [name] is a 59 y.o. year old male who sees Marthenia Rolling, DO for primary care.  LCSW received referral to assist patient wife with caregiver resources .  Called patient's wife to assess needs and barriers reference the above referral. Telephone outreach was unsuccessful. Unable to leave  phone message as voice mail was not set up  Plan: LCSW will call again in 1 to 2 days.  Sammuel Hines, LCSW Clinical Social Worker [facility] / [facility]   [phone] 2:21 PM

## 2019-05-01 ENCOUNTER — Other Ambulatory Visit: Payer: Self-pay

## 2019-05-01 ENCOUNTER — Ambulatory Visit: Payer: Self-pay | Admitting: Licensed Clinical Social Worker

## 2019-05-01 ENCOUNTER — Telehealth: Payer: Medicare HMO

## 2019-05-01 NOTE — Chronic Care Management (AMB) (Signed)
   Social Work Care Management  2nd Unsuccessful  Phone Outreach   05/01/2019 Name: [name] [name] MRN: [id number] DOB: 11-May-1960  Referred by: Marthenia Rolling, DO ,  Reason for referral : Care Coordination (caregiver resources)   [name] [name] is a 59 y.o. year old male who sees Marthenia Rolling, DO for primary care. 2nd unsuccessful telephone outreach attempt to Mr. [name] [name]' [name] today.  Unable to leave a phone message as the voice mail is not set up.  If patient's [name] calls please give her phone number to speak with LCSW [phone].  Plan: LCSW  will reach out to [name] [name] again over the next 7 days. If unable to reach [name] [name] by phone on the 3rd attempt, will discontinue outreach calls but will be available at any time to provide services as needed.   [name] Hines, LCSW Clinical Social Worker [facility] / [facility]   [phone] 9:03 AM

## 2019-05-05 ENCOUNTER — Ambulatory Visit: Payer: Self-pay | Admitting: Licensed Clinical Social Worker

## 2019-05-05 NOTE — Chronic Care Management (AMB) (Signed)
   Clinical Social Work  Care Management referral   05/05/2019 Name: [name] [name] MRN: [id number] DOB: 03-07-1960  [name] [name] is a 59 y.o. year old male who is a primary care patient of Marthenia Rolling, DO . LCSW was consulted by PCP to assistance patient's wife with  Caregiver Stress and supportive resources.   Received return call from patient's wife,  reports patient is receiving support from palliative care.  She is doing fine and does not need any additional support at this time  Intervention: LCSW informed [name] of support that is available to her from [facility] since patient receives Health visitor.  Review of patient status, including review of consultants reports, relevant laboratory and other test results, and collaboration with appropriate care team members and the patient's provider was performed as part of comprehensive patient evaluation and provision of care management services.    Plan: Ms.Water's appreciative of information provided.   1. She will contact Palliative Care for support as needed 2. No additional interventions needs by LCSW at this time  Sammuel Hines, LCSW Clinical Social Worker [facility] / [facility]   [phone] 3:01 PM

## 2019-05-06 ENCOUNTER — Telehealth: Payer: Medicare HMO

## 2019-05-21 ENCOUNTER — Other Ambulatory Visit: Payer: Self-pay | Admitting: Family Medicine

## 2019-05-21 DIAGNOSIS — I63512 Cerebral infarction due to unspecified occlusion or stenosis of left middle cerebral artery: Secondary | ICD-10-CM

## 2019-05-28 DIAGNOSIS — R69 Illness, unspecified: Secondary | ICD-10-CM | POA: Diagnosis not present

## 2019-05-28 DIAGNOSIS — M6008 Infective myositis, other site: Secondary | ICD-10-CM | POA: Diagnosis not present

## 2019-05-28 DIAGNOSIS — S022XXD Fracture of nasal bones, subsequent encounter for fracture with routine healing: Secondary | ICD-10-CM | POA: Diagnosis not present

## 2019-06-17 NOTE — Progress Notes (Signed)
April 22nd, 2021 [facility] Palliative Care Consult Note Telephone: [phone]  Fax: [phone]  PATIENT NAME: [name] [name] DOB: [birth year] MRN: [id number] [address] Unit [name] [name] [address]  PRIMARY CARE PROVIDER:   Marthenia Rolling, DO  REFERRING PROVIDER:  Marthenia Rolling, DO [address] [place],  [place] [address]  RESPONSIBLE PARTY:   [name] [name], [name] [name] (Spouse) [phone] (Mobile)  ASSESSMENT / RECOMMENDATIONS:  1. Advance Care Planning: A. Directives: DNR and MOST form present in the home. I uploaded into [facility] EMR. B. Goals of Care: Avoid SNF placement as wife feels pt would want to be at home when he dies.   2. Symptom Management / Cognitive / Functional status: Progression in his dementia and mobility decline. He answers questions appropriately, but only one-to-two-word responses. Can nod head yes and no. Able to maintain eye contact. Can do purposeful movements and can transfer laying to sitting and back, but over the last 6 months can no longer stand up on his own or ambulate. He can stand with 1-person heavy assist. He is no longer able to ambulate with a walker. Followed by ST/PT till July 2020 [name] [name] [phone]) but was discharged d/t lack of progression. Patient sleeps/stays on the couch 24/7. This has facilitated his care and safety, as he is unable to wander and is safe to leave by himself. Because of this, he has had no falls. He is 100% dependent in all ADLs including dressing, bathing, diaper changes (incontinent of bowel and bladder/depends). He can feed himself but clumsily, so wife assists. He has a good appetite. BM qd; passing urine okay. Skin intact; spouse uses a barrier cream on his bottom. He sleeps through the night and dozes much of the day.  -recommend washable cloth incontinence pads to ease care. I called [facility] and placed the order. [name] [name] will pick these up tomorrow.   3. 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 They have been married for 33 years. They have 2 grown daughters who keep in daily phone contact, but do not often visit d/t COVID and b/c they have jobs/little children to care for.  [name] [name] states that since patient's illness started about 24 years ago, that he's had times of getting worse, but has rebounded. She has now sadly concluded that he will not be getting better and is likely to show further decline. She's thankful he is still alive. She tries not to look ahead too much into the future, just take one day at a time. She relishes when she can get a few minutes here and there for herself, just to take a breath or watch a little TV. As of late patient has been calling out her name repetitiously and doesn't appear to want anything. We discussed that this is likely progression in his brain disease, and that she could just respond to him in a reassuring manor and tone  Patient has an assigned Clinical Social Work (05/05/19) available for support is needed. [name] Hines LCSW [facility] [phone]. I left a message on her voice mail, to check if there are services available (such as paying spouse for caring for her disabled husband) or other services [name] [name] may be able to tap into.  4. Follow up Palliative Care Visit: Tue 08/25/1958  I spent 60 minutes providing this consultation from 3:30pm to 4:40pm. More than 50%  of the time in this consultation was spent coordinating communication.   HISTORY OF PRESENT ILLNESS:  [name] [name] is a 59 y.o. year male with Progressive vascular dementia, multi strokes, ETOH abuse, bipolar disorder, depression, duodenal ulcer, encephalomalacia (bilateral frontal, traumatic), hepatic encephalopathy (? r/t possible hepatic steatosis), tobacco use. Lost eye from fall 2019.   Palliative Care was asked to help address goals of care.   CODE  STATUS: DNR  PPS: 0% HOSPICE ELIGIBILITY/DIAGNOSIS: TBD  PAST MEDICAL HISTORY:  Past Medical History:  Diagnosis Date  . Alcohol abuse   . ANEMIA, FOLIC ACID DEFICIENCY 09/27/9560   Qualifier: Diagnosis of  By: [name] [name]  [name] [name]    . Arthritis    left knee  . Bipolar disorder ([place])   . Cerebral infarct, left corona radiata ([place]) 07/17/2018  . Dementia ([place]), vascular 07/17/2018  . Depression   . DEPRESSION, MAJOR, RECURRENT 04/25/2006   Qualifier: Diagnosis of  By: [name] [name] [name] [name]    . Duodenal ulcer 12/06/2014   Qualifier: Diagnosis of  By: [name] [name] [name] [name]    . 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 Erectile dysfunction 02/21/2018  . Fall   . Gastrointestinal hemorrhage associated with duodenal ulcer   . GERD (gastroesophageal reflux disease)   . Hemorrhage from open wound of right chest wall   . Hepatic encephalopathy ([place]), possible history of 09/03/2010  . Hepatic steatosis 06/09/2010   Diagnosed by U/S 05/12/10. Likely secondary to chronic ETOH abuse. Will continue to follow.    . History of traumatic brain injury   . Hyperkalemia 12/08/2014  . Hypertension   . Left leg weakness 07/27/2015  . Orbital floor (blow-out) closed fracture ([place]) 05/25/2017   Left orbit, stable.  No surgical intervention required.  . Pneumonia   . Pure hypercholesterolemia 07/25/2018  . Stroke [facility])    a few spanning from 1997 to last year  . Tobacco dependence     SOCIAL HX:  Social History   Tobacco Use  . Smoking status: Former Smoker    Packs/day: 0.50    Years: 30.00    Pack years: 15.00    Types: Cigarettes  . Smokeless tobacco: Never Used  . Tobacco comment: does not want to quitt right now  Substance Use Topics  . Alcohol use: Not Currently    Alcohol/week: 14.0 standard drinks    Types: 14 Cans of beer per week    Comment: " every friday I get a 5th "    ALLERGIES:  Allergies  Allergen Reactions  . Ace Inhibitors  Anaphylaxis and Swelling    Tongue and face became swollen  . Penicillins Rash    From childhood: Has patient had a PCN reaction causing immediate rash, facial/tongue/throat swelling, SOB or lightheadedness with hypotension: Yes Has patient had a PCN reaction causing severe rash involving mucus membranes or skin necrosis: Unk Has patient had a PCN reaction that required hospitalization: Unk Has patient had a PCN reaction occurring within the last 10 years: No If all of the above answers are "NO", then may proceed with Cephalosporin use.      PERTINENT MEDICATIONS:  Outpatient Encounter Medications as of 06/18/2019  Medication Sig  . acetaminophen (TYLENOL) 325 MG tablet Take 650 mg by mouth every 6 (six) hours as needed for mild pain.  Marland Kitchen allopurinol (ZYLOPRIM) 300 MG tablet Take 1 tablet (300 mg total) by mouth daily.  Marland Kitchen atorvastatin (LIPITOR) 80 MG tablet Take 1 tablet (80 mg  total) by mouth daily.  . clopidogrel (PLAVIX) 75 MG tablet TAKE 1 TABLET BY MOUTH EVERY DAY  . diclofenac sodium (VOLTAREN) 1 % GEL Apply 2 g topically 4 (four) times daily.  Marland Kitchen erythromycin ophthalmic ointment Place 1 application into the right eye 3 (three) times daily.  Marland Kitchen FLUoxetine (PROZAC) 40 MG capsule Take 40 mg by mouth at bedtime.  . folic acid (FOLVITE) 1 MG tablet Take 1 tablet (1 mg total) by mouth daily.  . isosorbide dinitrate (ISORDIL) 5 MG tablet TAKE 1 TABLET BY MOUTH THREE TIMES A DAY  . lactulose (CHRONULAC) 10 GM/15ML solution Take 45 mLs (30 g total) by mouth daily as needed for mild constipation.  Marland Kitchen NICOTINE STEP 1 21 MG/24HR patch PLACE 1 PATCH ONTO THE SKIN DAILY. (Patient taking differently: Place 21 mg onto the skin daily. )  . omega-3 acid ethyl esters (LOVAZA) 1 g capsule TAKE 1 CAPSULE BY MOUTH DAILY  . pantoprazole (PROTONIX) 40 MG tablet Take 1 tablet (40 mg total) by mouth daily.   No facility-administered encounter medications on file as of 06/18/2019.    PHYSICAL EXAM:   Late  middle-aged appearing male, NAD. Sitting shirtless on couch. Observed independent to lay/sit/cover himself up. Maintaining eye contact. Enucleated R eye. Flat but sweet affect.  PE deferred to limit COVID exposure. Skin: no rashes Neurological: Weakness but otherwise non-focal  Anselm Lis, NP

## 2019-06-18 ENCOUNTER — Other Ambulatory Visit: Payer: Medicare HMO | Admitting: Internal Medicine

## 2019-06-18 ENCOUNTER — Other Ambulatory Visit: Payer: Self-pay

## 2019-06-18 DIAGNOSIS — Z7189 Other specified counseling: Secondary | ICD-10-CM

## 2019-06-18 DIAGNOSIS — Z515 Encounter for palliative care: Secondary | ICD-10-CM | POA: Diagnosis not present

## 2019-06-19 ENCOUNTER — Encounter: Payer: Self-pay | Admitting: Internal Medicine

## 2019-06-24 ENCOUNTER — Ambulatory Visit: Payer: Self-pay | Admitting: Licensed Clinical Social Worker

## 2019-06-24 NOTE — Chronic Care Management (AMB) (Signed)
   Social Work  Care Management Collaboration 06/24/2019 Name: [name] [name] MRN: [id number] DOB: [birth year]  [name] [name] is a 59 y.o. year old male who sees Marthenia Rolling, DO for primary care. LCSW was consulted by Corrie Dandy RN from [facility] for information about paid personal care from a family member. Patient was not interviewed or contacted during this encounter.    Recommendation: After collaboration with [facility] RN Corrie Dandy it is determined that patient may benefit from contacting the CAPS program with [facility]. Since LCSW has not been in contact with family. Corrie Dandy will share information with them.  Intervention: LCSW collaborated with RN,  looked up resources and called to confirm services provided. Review of patient status, including review of consultants reports, relevant laboratory and other test results, and collaboration with appropriate care team members and the patient's provider was performed as part of comprehensive patient evaluation and provision of chronic care management services.    Plan:  1. [facility] RN care manager will follow up with the patient and provide resources discussed today    2. No further follow up required by LCSW at this time   Sammuel Hines, LCSW Chronic Care Coordination  [facility]   [phone] 9:34 AM

## 2019-06-27 DIAGNOSIS — M6008 Infective myositis, other site: Secondary | ICD-10-CM | POA: Diagnosis not present

## 2019-06-27 DIAGNOSIS — R69 Illness, unspecified: Secondary | ICD-10-CM | POA: Diagnosis not present

## 2019-06-27 DIAGNOSIS — S022XXD Fracture of nasal bones, subsequent encounter for fracture with routine healing: Secondary | ICD-10-CM | POA: Diagnosis not present

## 2019-07-01 ENCOUNTER — Telehealth: Payer: Self-pay

## 2019-07-01 NOTE — Telephone Encounter (Signed)
[name] calls nurse line regarding potential decreased visual acuity. Per wife, patient has already had right eye removed and that she is concerned that he has decreased visual acuity in left eye. However, patient is unable to accurately report vision. Patient is also not wanting to be evaluated in office or ED. No other symptoms. Wife to monitor closely at home. ED precautions given.   FYI to PCP  Veronda Prude, RN

## 2019-07-13 ENCOUNTER — Other Ambulatory Visit: Payer: Self-pay | Admitting: Family Medicine

## 2019-07-13 DIAGNOSIS — Z8673 Personal history of transient ischemic attack (TIA), and cerebral infarction without residual deficits: Secondary | ICD-10-CM

## 2019-07-13 DIAGNOSIS — I63512 Cerebral infarction due to unspecified occlusion or stenosis of left middle cerebral artery: Secondary | ICD-10-CM

## 2019-08-25 ENCOUNTER — Encounter: Payer: Self-pay | Admitting: Internal Medicine

## 2019-08-25 ENCOUNTER — Other Ambulatory Visit: Payer: Self-pay

## 2019-08-25 ENCOUNTER — Other Ambulatory Visit: Payer: Medicare HMO | Admitting: Internal Medicine

## 2019-08-25 DIAGNOSIS — Z515 Encounter for palliative care: Secondary | ICD-10-CM | POA: Diagnosis not present

## 2019-08-25 DIAGNOSIS — Z7189 Other specified counseling: Secondary | ICD-10-CM | POA: Diagnosis not present

## 2019-08-26 NOTE — Progress Notes (Signed)
June 29th,  2021 [facility] Palliative Care Consult Note Telephone: [phone]  Fax: [phone]   PATIENT NAME: [name] [name] DOB: [birth year] MRN: [id number] [address] Unit [place] [address]   PRIMARY CARE PROVIDER:   Marthenia Rolling, DO   REFERRING PROVIDER:  Marthenia Rolling, DO [address] [place],  [place] [address]   RESPONSIBLE PARTY:   Trell, Secrist (Spouse) [phone] (Mobile)   ASSESSMENT / RECOMMENDATIONS:  1. Advance Care Planning: A. Directives: DNR and MOST form present in the home an uploaded into [facility] EMR. B. Goals of Care: Avoid SNF placement as wife feels pt would want to be at home when he dies. Would anticipate hospice referral when eligible.    2. Symptom Management / Cognitive / Functional status:  Patient had an episode early May; seemed to be experiencing decreased vision L eye (lost R eye 2019 s/p fall). Patient unable to accurately report if visual changes. He did not wish to be evaluated at [facility] office nor the ER.  Currently patient able to see me but having difficulty accurately reaching for my extended hand; not sure if d/t decreased vision or poor coordination.  Continues slow progressive decline dementia and mobility. He answers questions appropriately, but in just one-to-two-words. Can nod head yes and no. Able to maintain eye contact. Can do purposeful movements and can transfer lying to sitting and back, but unable to stand up (without heavy 1 person assist) on his own or ambulate even with walker. No longer being followed by ST/PT. He can stand with 1-person heavy assist. He is no longer able to ambulate with a walker. Patient sleeps/stays on the couch 24/7. This has facilitated his care and safety, as he is unable to wander and is safe to leave by himself. He is 100% dependent in all ADLs including dressing, bathing, diaper changes (incontinent of bowel and bladder/depends). He insists on feeding himself but  does so clumsily; spouse will assist as patient allows. He has a good appetite. BM qod to bid; passing urine okay. Skin intact; spouse uses a barrier cream on his bottom. He sleeps through the night and dozes much of the day. Sometimes awake during the night and calls out for spouse.               3. 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 They have been married for 33 years. They have 2 grown daughters who keep in daily phone contact; visit every week or so but don't really assist with care b/c they have jobs/little children to care for.  Jasmine December realizes patient will not be getting better and is likely to show further decline. She's thankful he is still alive. She tries not to look ahead too much into the future, just take one day at a time. She relishes when she can get a few minutes here and there for herself, just to take a breath or watch a little TV. There are huge amounts of laundry d/t patient's incontinence. Spends 5 hours every Sat in laundry mat.   Patient resourced patient's assigned Clinical Social Work (05/05/19) Sammuel Hines LCSW [facility] [phone], who provided Jasmine December with phone contact for resources. Unfortunately patient is slightly above income for CAP or any other available services.    4. Follow up Palliative Care Visit: Tue 10/06/2019 @ 3pm I spent 60 minutes providing this  consultation from 3pm to 4pm. More than 50% of the time in this consultation was spent coordinating communication.    HISTORY OF PRESENT ILLNESS:  [name] [name] is a 59 y.o. year male with Progressive vascular dementia, multi strokes, ETOH abuse, bipolar disorder, depression, duodenal ulcer, encephalomalacia (bilateral frontal, traumatic), hepatic encephalopathy (? r/t possible hepatic steatosis), tobacco use. Lost eye from fall 2019.    This is a f/u Palliative Care visit from  06/18/19.    CODE STATUS: DNR   PPS: 30%TBD  PAST MEDICAL HISTORY:  Past Medical History:  Diagnosis Date  . Alcohol abuse   . ANEMIA, FOLIC ACID DEFICIENCY 08/02/2009   Qualifier: Diagnosis of  By: Sharen Hones  MD, Wynona Canes    . Arthritis    left knee  . Bipolar disorder ([facility])   . Cerebral infarct, left corona radiata ([facility]) 07/17/2018  . Dementia ([facility]), vascular 07/17/2018  . Depression   . DEPRESSION, MAJOR, RECURRENT 04/25/2006   Qualifier: Diagnosis of  By: Seleta Rhymes MD, Loraine Leriche    . Duodenal ulcer 12/06/2014   Qualifier: Diagnosis of  By: Seleta Rhymes MD, Loraine Leriche    . 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 Erectile dysfunction 02/21/2018  . Fall   . Gastrointestinal hemorrhage associated with duodenal ulcer   . GERD (gastroesophageal reflux disease)   . Hemorrhage from open wound of right chest wall   . Hepatic encephalopathy ([facility]), possible history of 09/03/2010  . Hepatic steatosis 06/09/2010   Diagnosed by U/S 05/12/10. Likely secondary to chronic ETOH abuse. Will continue to follow.    . History of traumatic brain injury   . Hyperkalemia 12/08/2014  . Hypertension   . Left leg weakness 07/27/2015  . Orbital floor (blow-out) closed fracture ([facility]) 05/25/2017   Left orbit, stable.  No surgical intervention required.  . Pneumonia   . Pure hypercholesterolemia 07/25/2018  . Stroke [facility])    a few spanning from 1997 to last year  . Tobacco dependence     SOCIAL HX:  Social History   Tobacco Use  . Smoking status: Former Smoker    Packs/day: 0.50    Years: 30.00    Pack years: 15.00    Types: Cigarettes  . Smokeless tobacco: Never Used  . Tobacco comment: does not want to quitt right now  Substance Use Topics  . Alcohol use: Not Currently    Alcohol/week: 14.0 standard drinks    Types: 14 Cans of beer per week    Comment: " every friday I get a 5th "    ALLERGIES:  Allergies  Allergen Reactions  . Ace Inhibitors Anaphylaxis  and Swelling    Tongue and face became swollen  . Penicillins Rash    From childhood: Has patient had a PCN reaction causing immediate rash, facial/tongue/throat swelling, SOB or lightheadedness with hypotension: Yes Has patient had a PCN reaction causing severe rash involving mucus membranes or skin necrosis: Unk Has patient had a PCN reaction that required hospitalization: Unk Has patient had a PCN reaction occurring within the last 10 years: No If all of the above answers are "NO", then may proceed with Cephalosporin use.      PERTINENT MEDICATIONS:  Outpatient Encounter Medications as of 08/25/2019  Medication Sig  . acetaminophen (TYLENOL) 325 MG tablet Take 650 mg by mouth every 6 (six) hours as needed for mild pain.  Marland Kitchen allopurinol (ZYLOPRIM) 300 MG tablet Take 1 tablet (300 mg total) by mouth daily.  Marland Kitchen atorvastatin (LIPITOR)  80 MG tablet TAKE 1 TABLET BY MOUTH EVERY DAY  . clopidogrel (PLAVIX) 75 MG tablet TAKE 1 TABLET BY MOUTH EVERY DAY  . diclofenac sodium (VOLTAREN) 1 % GEL Apply 2 g topically 4 (four) times daily.  Marland Kitchen erythromycin ophthalmic ointment Place 1 application into the right eye 3 (three) times daily.  Marland Kitchen FLUoxetine (PROZAC) 40 MG capsule Take 40 mg by mouth at bedtime.  . folic acid (FOLVITE) 1 MG tablet Take 1 tablet (1 mg total) by mouth daily.  . isosorbide dinitrate (ISORDIL) 5 MG tablet TAKE 1 TABLET BY MOUTH THREE TIMES A DAY  . lactulose (CHRONULAC) 10 GM/15ML solution Take 45 mLs (30 g total) by mouth daily as needed for mild constipation.  Marland Kitchen NICOTINE STEP 1 21 MG/24HR patch PLACE 1 PATCH ONTO THE SKIN DAILY. (Patient taking differently: Place 21 mg onto the skin daily. )  . omega-3 acid ethyl esters (LOVAZA) 1 g capsule TAKE 1 CAPSULE BY MOUTH DAILY  . pantoprazole (PROTONIX) 40 MG tablet Take 1 tablet (40 mg total) by mouth daily.   No facility-administered encounter medications on file as of 08/25/2019.    GENERAL:  Patient sitting up on couch. Alert.  Looks in my direction when addressed. Pleasant. Answered appropriately to simple questions in 1-2 word responses. Spouse Jasmine December in attendance.  Anselm Lis, NP

## 2019-11-13 ENCOUNTER — Other Ambulatory Visit: Payer: Self-pay | Admitting: Family Medicine

## 2019-11-13 DIAGNOSIS — M109 Gout, unspecified: Secondary | ICD-10-CM

## 2019-11-13 MED ORDER — ALLOPURINOL 300 MG PO TABS: 300.0000 mg | ORAL_TABLET | Freq: Every day | ORAL | 11 refills | Status: DC

## 2019-11-13 NOTE — Telephone Encounter (Signed)
Patients wife is calling stating he is needing a refill on Allopurinol. Thanks

## 2019-11-15 ENCOUNTER — Other Ambulatory Visit: Payer: Self-pay | Admitting: Family Medicine

## 2019-11-15 DIAGNOSIS — I5022 Chronic systolic (congestive) heart failure: Secondary | ICD-10-CM

## 2019-11-16 ENCOUNTER — Other Ambulatory Visit: Payer: Self-pay | Admitting: *Deleted

## 2019-11-16 DIAGNOSIS — K279 Peptic ulcer, site unspecified, unspecified as acute or chronic, without hemorrhage or perforation: Secondary | ICD-10-CM

## 2019-11-16 MED ORDER — FOLIC ACID 1 MG PO TABS: 1.0000 mg | ORAL_TABLET | Freq: Every day | ORAL | 3 refills | Status: AC

## 2019-11-16 MED ORDER — OMEGA-3-ACID ETHYL ESTERS 1 G PO CAPS: ORAL_CAPSULE | ORAL | 3 refills | Status: DC

## 2019-11-16 MED ORDER — PANTOPRAZOLE SODIUM 40 MG PO TBEC: 40.0000 mg | DELAYED_RELEASE_TABLET | Freq: Every day | ORAL | 3 refills | Status: DC

## 2019-12-04 ENCOUNTER — Telehealth: Payer: Self-pay

## 2019-12-04 NOTE — Telephone Encounter (Signed)
(  4:15p) SW completed telephone call/consult with patient's daughter and wife. SW assessed needs and coping of the family. SW provided education regarding hospice services, SNF placement and medicaid, and VA benefits. Both patient's wife and daughter feel that patient maybe ready for hospice care as he has declined in ambulatory and cognitive status. SW advised them that she will discuss concerns with NP for follow-up.  *NP-S. Gayleen Orem was updated on call and will follow-up with patient/family regarding reported changes in condition and assess his appropriateness for hospice.

## 2019-12-07 ENCOUNTER — Other Ambulatory Visit: Payer: Self-pay

## 2019-12-07 ENCOUNTER — Telehealth: Payer: Self-pay

## 2019-12-07 ENCOUNTER — Other Ambulatory Visit: Payer: Medicare HMO | Admitting: Internal Medicine

## 2019-12-07 DIAGNOSIS — Z515 Encounter for palliative care: Secondary | ICD-10-CM

## 2019-12-07 DIAGNOSIS — F0151 Vascular dementia with behavioral disturbance: Secondary | ICD-10-CM

## 2019-12-07 DIAGNOSIS — R4189 Other symptoms and signs involving cognitive functions and awareness: Secondary | ICD-10-CM

## 2019-12-07 DIAGNOSIS — F01518 Vascular dementia, unspecified severity, with other behavioral disturbance: Secondary | ICD-10-CM

## 2019-12-07 NOTE — Progress Notes (Addendum)
Therapist, nutritional Palliative Care Consult Note Telephone: [phone]  Fax: [phone]  PATIENT NAME: [name] [name] DOB: May 02, 1960 MRN: [id number]  PRIMARY CARE PROVIDER:   Jovita Kussmaul, MD  REFERRING PROVIDER:  Jovita Kussmaul, MD [address] [place],  [place] [address]  RESPONSIBLE PARTY:  WifeJasmine [name] [phone]    RECOMMENDATIONS and PLAN:  Palliative care encounter Z51.5  1.  Advance care planning:  DNAR and MOST forms are complete and on file.  Goals are for patient to have quality of life with focus on comfort.  She is open to transition to Hospice care as pt. Is showing signs of decline of his health.    2.  Vascular dementia with behavioral disturbances:  Decline cognitively and functionally. FAST stage 7d.  Continue and increase level of  supportive care and transition to Hospice for support as patient's trajectory is towards end of life.  Continue Prozac for Monitor for additional signs of recurrent CVAs.  Wife will also consider placement in a residential facility.  3.  Physical deconditioning:  Related to recurrent and multiple CVAs and advanced vascular dementia.  Supportive and comfort care. He would benefit from use of a hospital bed and hoyer lift for assistance in repositioning.   4.  Bipolar with hallucinations:  Additional decline in association with decline of vascular dementia.  Continue Prozac nightly.  Monitor for safety.    I spent 90 minutes providing this consultation,  from 1500 to 1630. More than 50% of the time in this consultation was spent coordinating communication with patient, wife, 2 daughters. Conferred with Dr. Barbee [name] and [name] [name].  Message left for PCP to discuss updates and eligibility of Hospice care.   HISTORY OF PRESENT ILLNESS: Follow-up with [name] [name],  a 59 y.o. year old male. Wife reports that patient suddenly exhibited increased weakness of his L upper extremity and has lost the ability to  stand even with assistance.  He also requires assistance in arising to a seated position. 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  He is unable to manage use of utensils but is able to feed himself finger foods, he requires cueing to swallow food and medication which he is now chewing.  He remains incontinent of B&B(uses adult briefs).  He regularly sleeps on the sofa due to inability to walk and be placed in a bed.  Palliative Care was asked to help address goals of care.   CODE STATUS: DNAR/DNI PPS: 30% HOSPICE ELIGIBILITY/DIAGNOSIS: YES/ End stage vascular dementia  PAST MEDICAL HISTORY:  Past Medical History:  Diagnosis Date  . Alcohol abuse   . ANEMIA, FOLIC ACID DEFICIENCY 08/02/2009   Qualifier: Diagnosis of  By: Sharen Hones  MD, Wynona Canes    . Arthritis    left knee  . Bipolar disorder (HCC)   . Cerebral infarct, left corona radiata (HCC) 07/17/2018  . Dementia (HCC), vascular 07/17/2018  . Depression   . DEPRESSION, MAJOR, RECURRENT 04/25/2006   Qualifier: Diagnosis of  By: Seleta Rhymes MD, Loraine Leriche    . Duodenal ulcer 12/06/2014   Qualifier: Diagnosis of  By: Seleta Rhymes MD, Loraine Leriche    . 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 Erectile dysfunction 02/21/2018  . Fall   . Gastrointestinal hemorrhage associated with duodenal ulcer   . GERD (gastroesophageal reflux disease)   . Hemorrhage from open  wound of right chest wall   . Hepatic encephalopathy (HCC), possible history of 09/03/2010  . Hepatic steatosis 06/09/2010   Diagnosed by U/S 05/12/10. Likely secondary to chronic ETOH abuse. Will continue to follow.    . History of traumatic brain injury   . Hyperkalemia 12/08/2014  . Hypertension   . Left leg weakness 07/27/2015  . Orbital floor (blow-out) closed fracture (HCC) 05/25/2017   Left orbit, stable.  No surgical intervention required.  . Pneumonia   . Pure  hypercholesterolemia 07/25/2018  . Stroke [facility])    a few spanning from 1997 to last year  . Tobacco dependence     SOCIAL HX: Lives with wife/caregiver Social History   Tobacco Use  . Smoking status: Former Smoker    Packs/day: 0.50    Years: 30.00    Pack years: 15.00    Types: Cigarettes  . Smokeless tobacco: Never Used  . Tobacco comment: does not want to quitt right now  Substance Use Topics  . Alcohol use: Not Currently    Alcohol/week: 14.0 standard drinks    Types: 14 Cans of beer per week    Comment: " every friday I get a 5th "    ALLERGIES:  Allergies  Allergen Reactions  . Ace Inhibitors Anaphylaxis and Swelling    Tongue and face became swollen  . Penicillins Rash    From childhood: Has patient had a PCN reaction causing immediate rash, facial/tongue/throat swelling, SOB or lightheadedness with hypotension: Yes Has patient had a PCN reaction causing severe rash involving mucus membranes or skin necrosis: Unk Has patient had a PCN reaction that required hospitalization: Unk Has patient had a PCN reaction occurring within the last 10 years: No If all of the above answers are "NO", then may proceed with Cephalosporin use.      PERTINENT MEDICATIONS:  Outpatient Encounter Medications as of 12/07/2019  Medication Sig  . acetaminophen (TYLENOL) 325 MG tablet Take 650 mg by mouth every 6 (six) hours as needed for mild pain.  Marland Kitchen allopurinol (ZYLOPRIM) 300 MG tablet Take 1 tablet (300 mg total) by mouth daily.  Marland Kitchen atorvastatin (LIPITOR) 80 MG tablet TAKE 1 TABLET BY MOUTH EVERY DAY  . clopidogrel (PLAVIX) 75 MG tablet TAKE 1 TABLET BY MOUTH EVERY DAY  . diclofenac sodium (VOLTAREN) 1 % GEL Apply 2 g topically 4 (four) times daily.  Marland Kitchen erythromycin ophthalmic ointment Place 1 application into the right eye 3 (three) times daily.  Marland Kitchen FLUoxetine (PROZAC) 40 MG capsule Take 40 mg by mouth at bedtime.  . folic acid (FOLVITE) 1 MG tablet Take 1 tablet (1 mg total) by mouth  daily.  . isosorbide dinitrate (ISORDIL) 5 MG tablet TAKE 1 TABLET BY MOUTH THREE TIMES A DAY  . lactulose (CHRONULAC) 10 GM/15ML solution Take 45 mLs (30 g total) by mouth daily as needed for mild constipation.  Marland Kitchen NICOTINE STEP 1 21 MG/24HR patch PLACE 1 PATCH ONTO THE SKIN DAILY. (Patient taking differently: Place 21 mg onto the skin daily. )  . omega-3 acid ethyl esters (LOVAZA) 1 g capsule TAKE 1 CAPSULE BY MOUTH DAILY  . pantoprazole (PROTONIX) 40 MG tablet Take 1 tablet (40 mg total) by mouth daily.   No facility-administered encounter medications on file as of 12/07/2019.    PHYSICAL EXAM:   General: NAD, chronically ill and frail appearing, very thin male reclined on sofa EENT:  Difficulty in recognizing human figures, cloudiness of the R eye Cardiovascular: regular rate and rhythm Pulmonary:  clear ant fields Abdomen: soft, concave, nontender, + bowel sounds GU: no suprapubic tenderness Extremities: decreased muscle mass, no edema, no joint deformities Skin: exposed skin is intact Neurological: Alert and oriented to persons only by voice.  Weakness. Speaks in 2-3 words. Follows instructions intermittently Psych:  Easily agitated.  Calm when at rest  Margaretha Sheffield, NP-C

## 2019-12-07 NOTE — Telephone Encounter (Signed)
[name] Forest, NP with Palliative services calls nurse line regarding patient. NP reports that patient has had major decline with possibly another stroke. Patient has been unable to stand, is pocketing medication and needs reminders to chew and swallow food. NP believes that it would be appropriate for patient to be transferred to Hospice care.   If provider is agreeable please advise if patient will remain under our attendings for orders or will need to be placed under care of Hospice attending.   To PCP  Please return phone call to [employer] at [phone]   Veronda Prude, RN

## 2019-12-09 NOTE — Telephone Encounter (Signed)
Talbert Forest is calling back to check with Dr. Pecola Leisure. She would like to know if he agrees to transfer patient to hospice. If so she would need a referral placed.   Please call shirley back at [phone]

## 2019-12-10 NOTE — Telephone Encounter (Signed)
LM for [place] with hospice letting her know that the referral has been placed for hospice and will just need to know who to send it to.  [facility]

## 2019-12-12 DIAGNOSIS — K219 Gastro-esophageal reflux disease without esophagitis: Secondary | ICD-10-CM | POA: Diagnosis not present

## 2019-12-12 DIAGNOSIS — M109 Gout, unspecified: Secondary | ICD-10-CM | POA: Diagnosis not present

## 2019-12-12 DIAGNOSIS — R69 Illness, unspecified: Secondary | ICD-10-CM | POA: Diagnosis not present

## 2019-12-12 DIAGNOSIS — I10 Essential (primary) hypertension: Secondary | ICD-10-CM | POA: Diagnosis not present

## 2019-12-12 DIAGNOSIS — Z7902 Long term (current) use of antithrombotics/antiplatelets: Secondary | ICD-10-CM | POA: Diagnosis not present

## 2019-12-15 DIAGNOSIS — I10 Essential (primary) hypertension: Secondary | ICD-10-CM | POA: Diagnosis not present

## 2019-12-15 DIAGNOSIS — R69 Illness, unspecified: Secondary | ICD-10-CM | POA: Diagnosis not present

## 2019-12-24 ENCOUNTER — Telehealth: Payer: Self-pay | Admitting: Physician Assistant

## 2019-12-24 NOTE — Telephone Encounter (Signed)
I connected by phone with [name] [name] and/or patient's caregiver on 12/24/2019 at 6:12 PM to discuss the potential vaccination through our Homebound vaccination initiative.   Prevaccination Checklist for COVID-19 Vaccines  1.  Are you feeling sick today? no  2.  Have you ever received a dose of a COVID-19 vaccine?  no      If yes, which one? None   3.  Have you ever had an allergic reaction: (This would include a severe reaction [ e.g., anaphylaxis] that required treatment with epinephrine or EpiPen or that caused you to go to the hospital.  It would also include an allergic reaction that occurred within 4 hours that caused hives, swelling, or respiratory distress, including wheezing.) A.  A previous dose of COVID-19 vaccine. no  B.  A vaccine or injectable therapy that contains multiple components, one of which is a COVID-19 vaccine component, but it is not known which component elicited the immediate reaction. no  C.  Are you allergic to polyethylene glycol? no  D. Are you allergic to Polysorbate, which is found in some vaccines, film coated tablets and intravenous steroids?  no   4.  Have you ever had an allergic reaction to another vaccine (other than COVID-19 vaccine) or an injectable medication? (This would include a severe reaction [ e.g., anaphylaxis] that required treatment with epinephrine or EpiPen or that caused you to go to the hospital.  It would also include an allergic reaction that occurred within 4 hours that caused hives, swelling, or respiratory distress, including wheezing.)  no   5.  Have you ever had a severe allergic reaction (e.g., anaphylaxis) to something other than a component of the COVID-19 vaccine, or any vaccine or injectable medication?  This would include food, pet, venom, environmental, or oral medication allergies.  yes , ace inhibitors  6.  Have you received any vaccine in the last 14 days? no   7.  Have you ever had a positive test for COVID-19 or has a  doctor ever told you that you had COVID-19?  no   8.  Have you received passive antibody therapy (monoclonal antibodies or convalescent serum) as a treatment for COVID-19? no   9.  Do you have a weakened immune system caused by something such as HIV infection or cancer or do you take immunosuppressive drugs or therapies?  no   10.  Do you have a bleeding disorder or are you taking a blood thinner? yes   11.  Are you pregnant or breast-feeding? no   12.  Do you have dermal fillers? no   __________________   This patient is a 59 y.o. male that meets the FDA criteria to receive homebound vaccination. Patient or parent/caregiver understands they have the option to accept or refuse homebound vaccination.  Patient passed the pre-screening checklist and would like to proceed with homebound vaccination.  Based on questionnaire above, I recommend the patient be observed for 30 minutes.  There are an estimated 0 other household members/caregivers who are also interested in receiving the vaccine.   I will send the patient's information to our scheduling team who will reach out to schedule the patient and potential caregiver/family members for homebound vaccination.    [name] [name] 12/24/2019 6:12 PM

## 2020-01-05 ENCOUNTER — Ambulatory Visit: Payer: Medicare HMO | Attending: Psychiatry

## 2020-01-05 DIAGNOSIS — Z23 Encounter for immunization: Secondary | ICD-10-CM

## 2020-01-05 NOTE — Progress Notes (Signed)
   Covid-19 Vaccination Clinic  Name:  EUSEVIO SCHRIVER    MRN: [id number] DOB: 14-Dec-1960  01/05/2020  Mr. Feighner was observed post Covid-19 immunization for 15 minutes without incident. He was provided with Vaccine Information Sheet and instruction to access the V-Safe system.   Mr. Kobus was instructed to call 911 with any severe reactions post vaccine: Marland Kitchen Difficulty breathing  . Swelling of face and throat  . A fast heartbeat  . A bad rash all over body  . Dizziness and weakness   Immunizations Administered    Name Date Dose VIS Date Route   Pfizer COVID-19 Vaccine 01/05/2020  2:30 PM 0.3 mL 12/16/2019 Intramuscular   Manufacturer: [employer], [employer]   Lot: [id number]   NDC: [address]-8

## 2020-01-26 ENCOUNTER — Ambulatory Visit: Payer: Medicare HMO

## 2020-01-27 ENCOUNTER — Other Ambulatory Visit: Payer: Self-pay

## 2020-01-27 ENCOUNTER — Ambulatory Visit: Attending: Internal Medicine

## 2020-01-27 DIAGNOSIS — Z23 Encounter for immunization: Secondary | ICD-10-CM

## 2020-01-27 NOTE — Progress Notes (Signed)
   Covid-19 Vaccination Clinic  Name:  [name] [name]    MRN: [id number] DOB: [birth year]  01/27/2020  [name] was observed post Covid-19 immunization for 15 minutes without incident. He was provided with Vaccine Information Sheet and instruction to access the V-Safe system.   [name] was instructed to call 911 with any severe reactions post vaccine: Marland Kitchen Difficulty breathing  . Swelling of face and throat  . A fast heartbeat  . A bad rash all over body  . Dizziness and weakness

## 2020-01-29 ENCOUNTER — Ambulatory Visit: Payer: Medicare HMO

## 2020-02-04 DIAGNOSIS — Z7902 Long term (current) use of antithrombotics/antiplatelets: Secondary | ICD-10-CM | POA: Diagnosis not present

## 2020-02-04 DIAGNOSIS — I10 Essential (primary) hypertension: Secondary | ICD-10-CM | POA: Diagnosis not present

## 2020-02-04 DIAGNOSIS — R69 Illness, unspecified: Secondary | ICD-10-CM | POA: Diagnosis not present

## 2020-04-16 DIAGNOSIS — Z0000 Encounter for general adult medical examination without abnormal findings: Secondary | ICD-10-CM | POA: Diagnosis not present

## 2020-05-14 DIAGNOSIS — Z0000 Encounter for general adult medical examination without abnormal findings: Secondary | ICD-10-CM | POA: Diagnosis not present

## 2020-06-09 ENCOUNTER — Other Ambulatory Visit: Payer: Self-pay | Admitting: Family Medicine

## 2020-06-10 ENCOUNTER — Other Ambulatory Visit: Payer: Self-pay | Admitting: Family Medicine

## 2020-06-14 DIAGNOSIS — Z0000 Encounter for general adult medical examination without abnormal findings: Secondary | ICD-10-CM | POA: Diagnosis not present

## 2020-06-30 ENCOUNTER — Other Ambulatory Visit: Payer: Self-pay

## 2020-06-30 DIAGNOSIS — Z8673 Personal history of transient ischemic attack (TIA), and cerebral infarction without residual deficits: Secondary | ICD-10-CM

## 2020-06-30 DIAGNOSIS — I63512 Cerebral infarction due to unspecified occlusion or stenosis of left middle cerebral artery: Secondary | ICD-10-CM

## 2020-07-06 ENCOUNTER — Ambulatory Visit: Payer: Medicare HMO | Attending: Critical Care Medicine

## 2020-07-06 ENCOUNTER — Other Ambulatory Visit: Payer: Self-pay

## 2020-07-06 DIAGNOSIS — Z23 Encounter for immunization: Secondary | ICD-10-CM

## 2020-07-06 NOTE — Progress Notes (Signed)
   Covid-19 Vaccination Clinic  Name:  [name] [name]    MRN: [id number] DOB: October 22, 1960  07/06/2020  [name] was observed post Covid-19 immunization for 15 minutes without incident. He was provided with Vaccine Information Sheet and instruction to access the V-Safe system.   [name] was instructed to call 911 with any severe reactions post vaccine: Marland Kitchen Difficulty breathing  . Swelling of face and throat  . A fast heartbeat  . A bad rash all over body  . Dizziness and weakness   Immunizations Administered    Name Date Dose VIS Date Route   PFIZER Comrnaty(Gray TOP) Covid-19 Vaccine 07/06/2020  4:18 PM 0.3 mL 02/04/2020 Intramuscular   Manufacturer: [employer], [employer]   Lot: [id number]   NDC: [phone]

## 2020-08-12 ENCOUNTER — Other Ambulatory Visit: Payer: Self-pay | Admitting: *Deleted

## 2020-08-12 DIAGNOSIS — I63512 Cerebral infarction due to unspecified occlusion or stenosis of left middle cerebral artery: Secondary | ICD-10-CM

## 2020-08-12 MED ORDER — CLOPIDOGREL BISULFATE 75 MG PO TABS: 1.0000 | ORAL_TABLET | Freq: Every day | ORAL | 3 refills | Status: AC

## 2020-09-26 ENCOUNTER — Other Ambulatory Visit: Payer: Self-pay

## 2020-09-26 DIAGNOSIS — Z8673 Personal history of transient ischemic attack (TIA), and cerebral infarction without residual deficits: Secondary | ICD-10-CM

## 2020-09-26 DIAGNOSIS — I63512 Cerebral infarction due to unspecified occlusion or stenosis of left middle cerebral artery: Secondary | ICD-10-CM

## 2020-11-09 ENCOUNTER — Other Ambulatory Visit: Payer: Self-pay

## 2020-11-09 DIAGNOSIS — K279 Peptic ulcer, site unspecified, unspecified as acute or chronic, without hemorrhage or perforation: Secondary | ICD-10-CM

## 2020-11-09 MED ORDER — PANTOPRAZOLE SODIUM 40 MG PO TBEC: 40.0000 mg | DELAYED_RELEASE_TABLET | Freq: Every day | ORAL | 3 refills | Status: AC

## 2020-11-25 IMAGING — MR MRI HEAD WITHOUT AND WITH CONTRAST
12 series · 48 of 48 positions shown · IV contrast (multihance)
Comparison: 07/27/2015

CLINICAL DATA: Cognitive changes after recent falls. History of
blunt force trauma

EXAM:
MRI HEAD WITHOUT AND WITH CONTRAST
TECHNIQUE: Multiplanar, multiecho pulse sequences of the brain and surrounding
structures were obtained without and with intravenous contrast.
Creatinine was obtained on site at [HOSPITAL] at [HOSPITAL].
Results: Creatinine 1.2 mg/dL.
CONTRAST:  20mL MULTIHANCE GADOBENATE DIMEGLUMINE 529 MG/ML IV SOLN

[Series 2: t1_se_sag · sagittal · 5.0mm · 0.45mm/px · 1 of 24 slices shown]
[im 1/24]
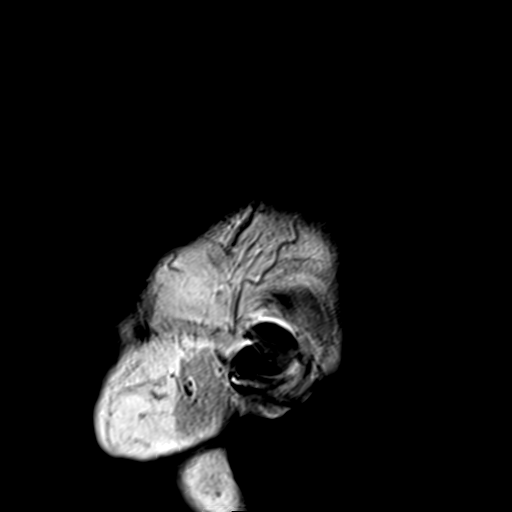

[Series 3: ep2d_diff_(id)_trace · axial · 3.0mm · 1.80mm/px · z∈[-30,+122]mm · 6 of 101 slices shown]
[im 1/101]
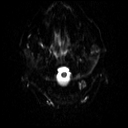
[im 21/101]
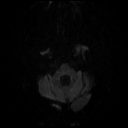
[im 41/101]
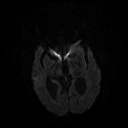
[im 61/101]
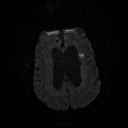
[im 81/101]
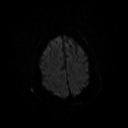
[im 101/101]
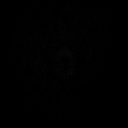

[Series 4: ep2d_diff_(id)_trace_adc · axial · 3.0mm · 1.80mm/px · z∈[-30,+122]mm · 3 of 52 slices shown]
[im 1/52]
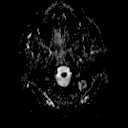
[im 26/52]
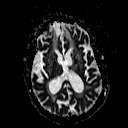
[im 52/52]
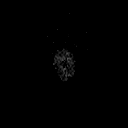

[Series 5: ep2d_diff_cor · coronal · 5.0mm · 1.77mm/px · 3 of 56 slices shown]
[im 1/56]
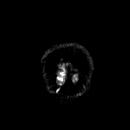
[im 28/56]
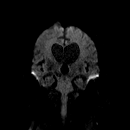
[im 56/56]
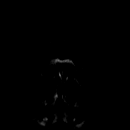

[Series 6: ep2d_diff_cor_adc · coronal · 5.0mm · 1.77mm/px · 2 of 28 slices shown]
[im 1/28]
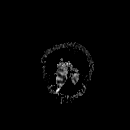
[im 28/28]
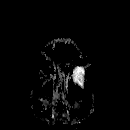

[Series 8: swi_images · axial · 2.0mm · 0.90mm/px · z∈[-32,+125]mm · 5 of 80 slices shown]
[im 1/80]
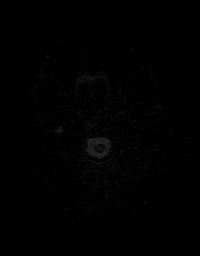
[im 20/80]
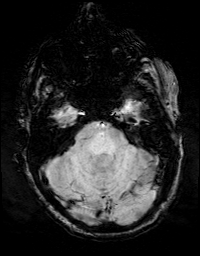
[im 40/80]
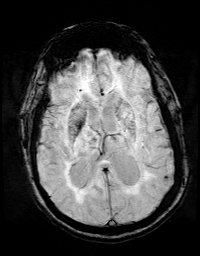
[im 60/80]
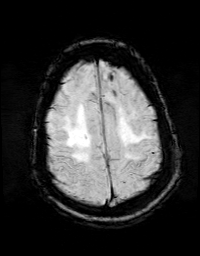
[im 80/80]
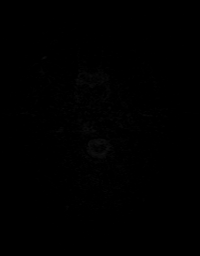

[Series 10: t2_tse_tra_512 · axial · 5.0mm · 0.60mm/px · z∈[-34,+127]mm · 2 of 28 slices shown]
[im 1/28]
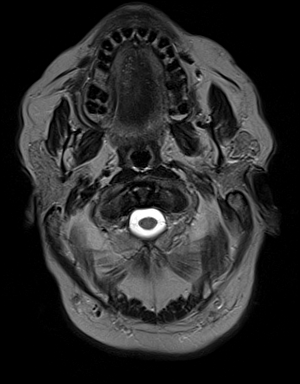
[im 28/28]
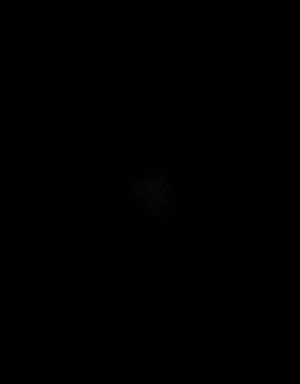

[Series 11: t1_mpr_tra · axial · 1.0mm · 0.72mm/px · z∈[-35,+123]mm · 10 of 160 slices shown]
[im 1/160]
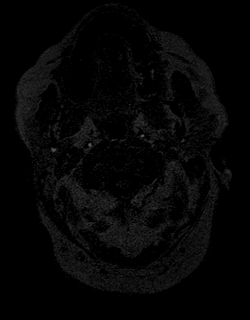
[im 18/160]
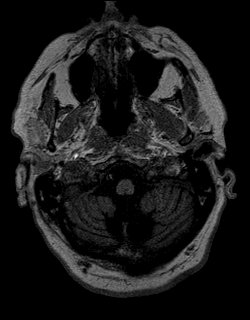
[im 36/160]
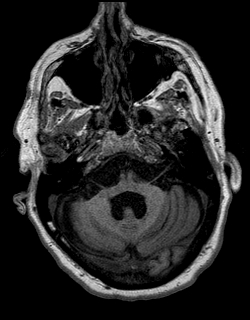
[im 54/160]
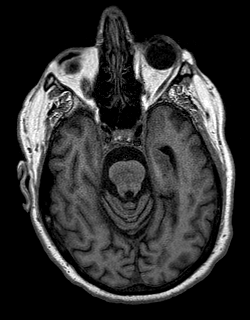
[im 71/160]
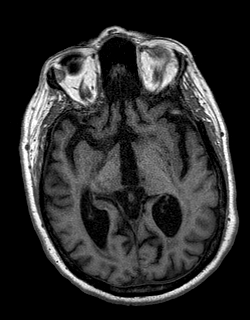
[im 89/160]
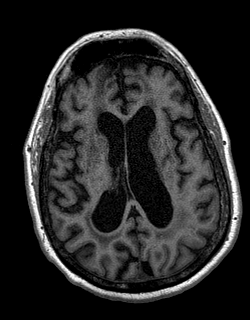
[im 107/160]
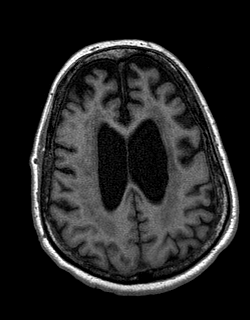
[im 124/160]
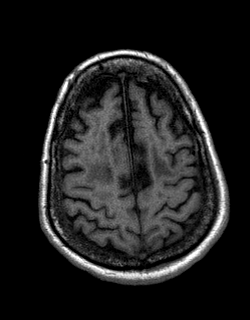
[im 142/160]
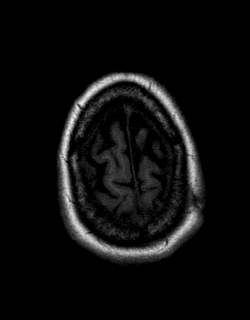
[im 160/160]
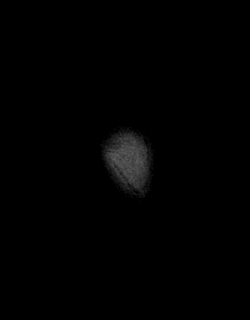

[Series 12: FLAIR · axial · 3.0mm · 0.43mm/px · z∈[-32,+123]mm · 2 of 27 slices shown]
[im 1/27]
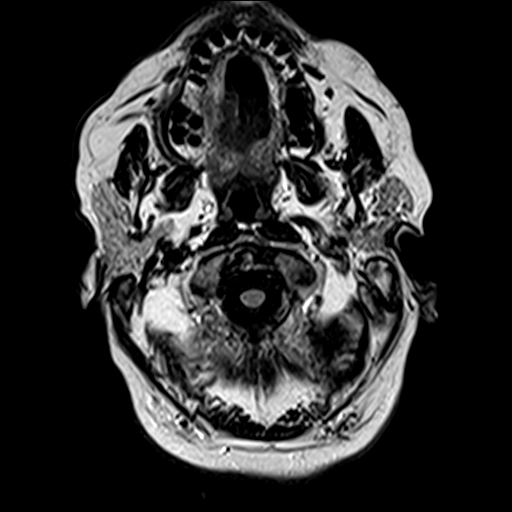
[im 27/27]
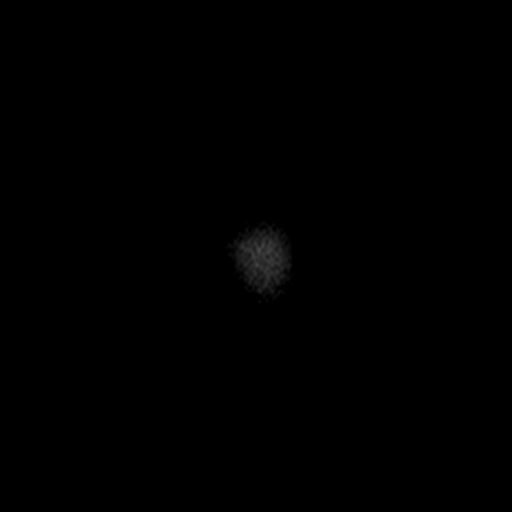

[Series 13: T2 · coronal · 5.0mm · 0.45mm/px · 2 of 28 slices shown]
[im 1/28]
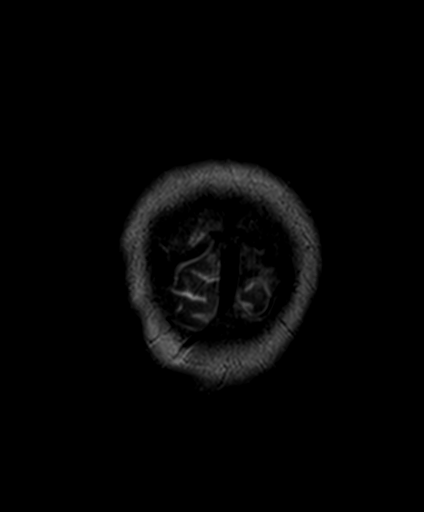
[im 28/28]
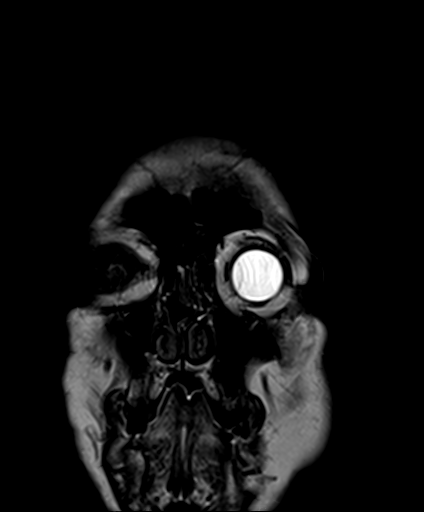

[Series 14: T1 post-contrast · coronal · 5.0mm · 0.72mm/px · 2 of 31 slices shown]
[im 1/31]
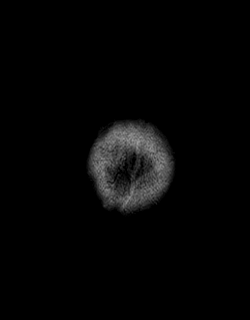
[im 31/31]
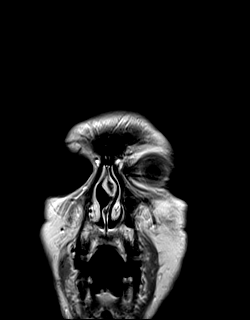

[Series 15: post t1_mpr_tra · axial · 1.0mm · 0.72mm/px · z∈[-35,+123]mm · 10 of 160 slices shown]
[im 1/160]
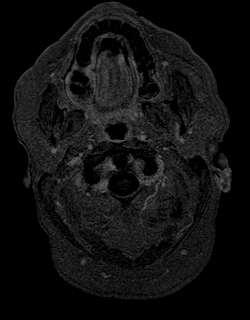
[im 18/160]
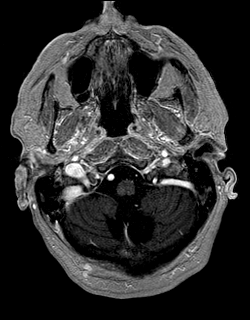
[im 36/160]
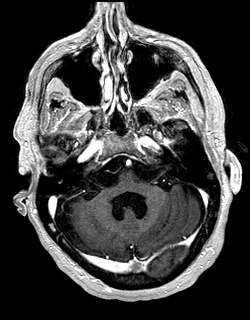
[im 54/160]
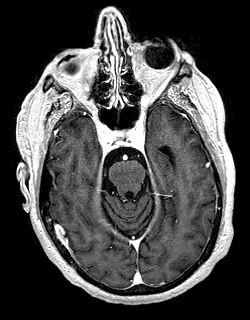
[im 71/160]
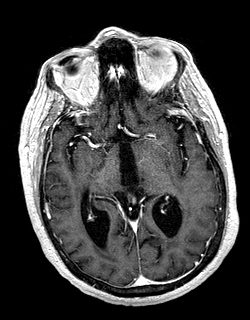
[im 89/160]
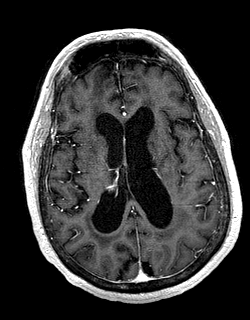
[im 107/160]
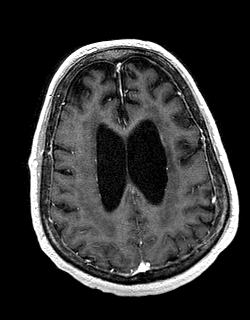
[im 124/160]
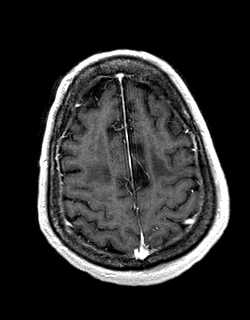
[im 142/160]
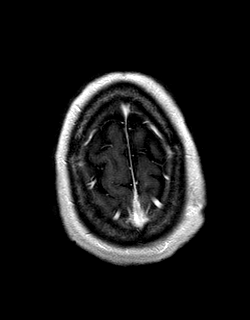
[im 160/160]
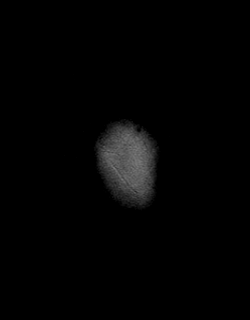

[48 of 48 positions shown; findings below may reference images not displayed]

FINDINGS: Brain: 1 cm acute infarct in the left anterior corona radiata. There
is a background of advanced chronic small vessel ischemia with
confluent gliosis in the pons and deep cerebral white matter,
notably progressed. There have been remote lacunar infarcts in the
deep gray nuclei.

Bilateral inferior frontal encephalomalacia attributed to history of
remote traumatic brain injury. There is generalized atrophy with
ventriculomegaly, significantly age advanced. No acute hemorrhage.
No obstructive hydrocephalus or collection. The corpus callosum
appears somewhat up lifted but no communicating hydrocephalus is
suspected. Scattered chronic blood products attributed to prior
trauma and likely from chronic hypertension (micro hemorrhages
present in the brainstem and right basal ganglia)

Vascular: Major flow voids are preserved.

Skull and upper cervical spine: Negative for marrow lesion

Sinuses/Orbits: Right enucleation with prosthesis.

These results will be called to the ordering clinician or
representative by the Radiologist Assistant, and communication
documented in the PACS or zVision Dashboard.
IMPRESSION: 1. Incidental acute lacunar infarct in the left corona radiata.
2. Background of advanced chronic small vessel ischemia which has
notably progressed from [AGE] advanced atrophy. History of traumatic brain injury with
bifrontal encephalomalacia.

## 2021-01-09 ENCOUNTER — Other Ambulatory Visit: Payer: Self-pay | Admitting: *Deleted

## 2021-01-09 DIAGNOSIS — M109 Gout, unspecified: Secondary | ICD-10-CM

## 2021-01-09 MED ORDER — ALLOPURINOL 300 MG PO TABS: 300.0000 mg | ORAL_TABLET | Freq: Every day | ORAL | 11 refills | Status: AC

## 2021-04-26 DEATH — deceased
